# Patient Record
Sex: Female | Born: 1940 | Race: White | Hispanic: No | Smoking: Never smoker
Health system: Southern US, Community
[De-identification: ages and names within clinical notes are randomized; demographics above are authoritative.]

## PROBLEM LIST (undated history)

## (undated) DIAGNOSIS — E039 Hypothyroidism, unspecified: Secondary | ICD-10-CM

## (undated) DIAGNOSIS — I10 Essential (primary) hypertension: Secondary | ICD-10-CM

## (undated) DIAGNOSIS — Z8489 Family history of other specified conditions: Secondary | ICD-10-CM

## (undated) DIAGNOSIS — E785 Hyperlipidemia, unspecified: Secondary | ICD-10-CM

## (undated) DIAGNOSIS — I639 Cerebral infarction, unspecified: Secondary | ICD-10-CM

## (undated) DIAGNOSIS — M810 Age-related osteoporosis without current pathological fracture: Secondary | ICD-10-CM

## (undated) DIAGNOSIS — C73 Malignant neoplasm of thyroid gland: Secondary | ICD-10-CM

## (undated) DIAGNOSIS — I2510 Atherosclerotic heart disease of native coronary artery without angina pectoris: Secondary | ICD-10-CM

## (undated) HISTORY — DX: Cerebral infarction, unspecified: I63.9

## (undated) HISTORY — PX: APPENDECTOMY: SHX54

## (undated) HISTORY — PX: OTHER SURGICAL HISTORY: SHX169

## (undated) HISTORY — PX: CHOLECYSTECTOMY: SHX55

## (undated) HISTORY — PX: ABDOMINAL HYSTERECTOMY: SHX81

## (undated) HISTORY — PX: TOTAL HIP ARTHROPLASTY: SHX124

## (undated) HISTORY — PX: ANKLE SURGERY: SHX546

## (undated) HISTORY — PX: THYROIDECTOMY: SHX17

---

## 1999-04-09 ENCOUNTER — Other Ambulatory Visit: Admission: RE | Admit: 1999-04-09 | Discharge: 1999-04-09 | Payer: Self-pay | Admitting: Obstetrics and Gynecology

## 2000-02-20 ENCOUNTER — Encounter: Payer: Self-pay | Admitting: Obstetrics and Gynecology

## 2000-02-20 ENCOUNTER — Encounter: Admission: RE | Admit: 2000-02-20 | Discharge: 2000-02-20 | Payer: Self-pay | Admitting: Obstetrics and Gynecology

## 2000-04-06 ENCOUNTER — Encounter: Admission: RE | Admit: 2000-04-06 | Discharge: 2000-07-05 | Payer: Self-pay | Admitting: Endocrinology

## 2000-07-14 ENCOUNTER — Encounter: Admission: RE | Admit: 2000-07-14 | Discharge: 2000-07-14 | Payer: Self-pay | Admitting: Endocrinology

## 2000-12-31 ENCOUNTER — Ambulatory Visit (HOSPITAL_COMMUNITY): Admission: RE | Admit: 2000-12-31 | Discharge: 2000-12-31 | Payer: Self-pay | Admitting: Pediatrics

## 2000-12-31 ENCOUNTER — Encounter: Payer: Self-pay | Admitting: Pediatrics

## 2001-06-24 ENCOUNTER — Encounter: Admission: RE | Admit: 2001-06-24 | Discharge: 2001-06-24 | Payer: Self-pay | Admitting: Obstetrics and Gynecology

## 2001-06-24 ENCOUNTER — Encounter: Payer: Self-pay | Admitting: Obstetrics and Gynecology

## 2002-04-07 ENCOUNTER — Encounter: Admission: RE | Admit: 2002-04-07 | Discharge: 2002-04-07 | Payer: Self-pay | Admitting: Orthopedic Surgery

## 2002-04-07 ENCOUNTER — Encounter: Payer: Self-pay | Admitting: Orthopedic Surgery

## 2002-06-29 ENCOUNTER — Encounter: Admission: RE | Admit: 2002-06-29 | Discharge: 2002-06-29 | Payer: Self-pay | Admitting: Obstetrics and Gynecology

## 2002-06-29 ENCOUNTER — Encounter: Payer: Self-pay | Admitting: Obstetrics and Gynecology

## 2002-09-06 ENCOUNTER — Encounter: Payer: Self-pay | Admitting: Orthopedic Surgery

## 2002-09-06 ENCOUNTER — Encounter: Admission: RE | Admit: 2002-09-06 | Discharge: 2002-09-06 | Payer: Self-pay | Admitting: *Deleted

## 2002-12-01 ENCOUNTER — Encounter: Payer: Self-pay | Admitting: Orthopedic Surgery

## 2002-12-05 ENCOUNTER — Inpatient Hospital Stay (HOSPITAL_COMMUNITY): Admission: RE | Admit: 2002-12-05 | Discharge: 2002-12-08 | Payer: Self-pay | Admitting: Orthopedic Surgery

## 2002-12-05 ENCOUNTER — Encounter: Payer: Self-pay | Admitting: Orthopedic Surgery

## 2003-07-27 ENCOUNTER — Encounter: Payer: Self-pay | Admitting: Obstetrics and Gynecology

## 2003-07-27 ENCOUNTER — Encounter: Admission: RE | Admit: 2003-07-27 | Discharge: 2003-07-27 | Payer: Self-pay | Admitting: Obstetrics and Gynecology

## 2003-12-05 ENCOUNTER — Ambulatory Visit (HOSPITAL_COMMUNITY): Admission: RE | Admit: 2003-12-05 | Discharge: 2003-12-05 | Payer: Self-pay | Admitting: Pediatrics

## 2004-09-17 ENCOUNTER — Encounter: Admission: RE | Admit: 2004-09-17 | Discharge: 2004-09-17 | Payer: Self-pay | Admitting: Obstetrics and Gynecology

## 2004-09-30 ENCOUNTER — Encounter: Admission: RE | Admit: 2004-09-30 | Discharge: 2004-09-30 | Payer: Self-pay | Admitting: Obstetrics and Gynecology

## 2005-01-01 ENCOUNTER — Encounter: Admission: RE | Admit: 2005-01-01 | Discharge: 2005-01-01 | Payer: Self-pay | Admitting: Endocrinology

## 2005-02-06 ENCOUNTER — Other Ambulatory Visit: Admission: RE | Admit: 2005-02-06 | Discharge: 2005-02-06 | Payer: Self-pay | Admitting: Interventional Radiology

## 2005-02-06 ENCOUNTER — Encounter (INDEPENDENT_AMBULATORY_CARE_PROVIDER_SITE_OTHER): Payer: Self-pay | Admitting: Specialist

## 2005-02-06 ENCOUNTER — Encounter: Admission: RE | Admit: 2005-02-06 | Discharge: 2005-02-06 | Payer: Self-pay | Admitting: Endocrinology

## 2005-03-28 ENCOUNTER — Observation Stay (HOSPITAL_COMMUNITY): Admission: RE | Admit: 2005-03-28 | Discharge: 2005-03-29 | Payer: Self-pay | Admitting: Surgery

## 2005-03-28 ENCOUNTER — Encounter (INDEPENDENT_AMBULATORY_CARE_PROVIDER_SITE_OTHER): Payer: Self-pay | Admitting: Specialist

## 2005-05-01 ENCOUNTER — Encounter: Payer: Self-pay | Admitting: Endocrinology

## 2005-05-02 ENCOUNTER — Ambulatory Visit (HOSPITAL_COMMUNITY): Admission: RE | Admit: 2005-05-02 | Discharge: 2005-05-02 | Payer: Self-pay | Admitting: Endocrinology

## 2005-12-19 ENCOUNTER — Encounter: Admission: RE | Admit: 2005-12-19 | Discharge: 2005-12-19 | Payer: Self-pay | Admitting: Obstetrics and Gynecology

## 2007-01-11 ENCOUNTER — Encounter: Admission: RE | Admit: 2007-01-11 | Discharge: 2007-01-11 | Payer: Self-pay | Admitting: Obstetrics and Gynecology

## 2007-06-30 ENCOUNTER — Ambulatory Visit: Payer: Self-pay | Admitting: Internal Medicine

## 2007-07-12 ENCOUNTER — Ambulatory Visit: Payer: Self-pay | Admitting: Internal Medicine

## 2008-01-25 ENCOUNTER — Encounter: Admission: RE | Admit: 2008-01-25 | Discharge: 2008-01-25 | Payer: Self-pay | Admitting: Obstetrics and Gynecology

## 2008-01-28 ENCOUNTER — Encounter: Admission: RE | Admit: 2008-01-28 | Discharge: 2008-01-28 | Payer: Self-pay | Admitting: Endocrinology

## 2008-06-16 ENCOUNTER — Encounter: Payer: Self-pay | Admitting: Family Medicine

## 2009-01-31 ENCOUNTER — Encounter: Admission: RE | Admit: 2009-01-31 | Discharge: 2009-01-31 | Payer: Self-pay | Admitting: Obstetrics and Gynecology

## 2009-05-08 ENCOUNTER — Telehealth (INDEPENDENT_AMBULATORY_CARE_PROVIDER_SITE_OTHER): Payer: Self-pay | Admitting: *Deleted

## 2010-01-25 HISTORY — PX: CARDIAC CATHETERIZATION: SHX172

## 2010-05-29 ENCOUNTER — Ambulatory Visit: Payer: Self-pay | Admitting: Vascular Surgery

## 2010-10-17 ENCOUNTER — Encounter: Admission: RE | Admit: 2010-10-17 | Discharge: 2010-10-17 | Payer: Self-pay | Admitting: Obstetrics and Gynecology

## 2010-12-07 ENCOUNTER — Encounter: Payer: Self-pay | Admitting: Obstetrics and Gynecology

## 2010-12-08 ENCOUNTER — Encounter: Payer: Self-pay | Admitting: Endocrinology

## 2011-04-01 NOTE — Consult Note (Signed)
NEW PATIENT CONSULTATION   Stracener, BILLIE-LYNN D  DOB:  11-Nov-1941                                       05/29/2010  CHART#:[id number]   Billie-Lynn Maziarz presents today for evaluation of bilateral  telangiectasia.  She also has some difficulty with swelling in her left  foot.  This occurs with sitting or standing and is worse if she has been  on it for a long of period time.  She does not have any history of DVT  or superficial thrombophlebitis.  She does have telangiectasia over her  lateral thighs more so on the right than on the left.   PAST MEDICAL HISTORY:  Her past history is significant for elevated  cholesterol and hypertension.  She does have history of thyroid cancer  as well.Marland Kitchen   FAMILY HISTORY:  Significant for significant varicosities in her mother.  No other major family history.   SOCIAL HISTORY:  She is single.  She does not have children.  She does  not smoke or drink alcohol.   REVIEW OF SYSTEMS:  Weight is 155 pounds.  She is 5 feet 3 inches tall.  Cardiac, pulmonary, GI, and GU negative.  VASCULAR:  Positive for swelling of left foot.  NEUROLOGIC:  Negative.  MUSCULOSKELETAL:  Positive for arthritis.  Psychiatric, EENT, hematologic and skin are all negative.   PHYSICAL EXAMINATION:  Well developed, well nourished white female  appearing stated age in no acute distress.  Blood pressure 139/68, pulse 59, respirations 18.  HEENT:  Normal.  Her radial and dorsalis pedis pulses are 2+ bilaterally.  MUSCULOSKELETAL:  Shows no major deformity or cyanosis.  NEUROLOGIC:  No focal weakness or paresthesias.  SKIN:  Without ulcers or rashes.  She does not have any significant swelling today and does not have any  large varicosities.  She does have scattered telangiectasia.   She had noninvasive laboratory studies in our office which I have  ordered and independently reviewed.  This does show no evidence of DVT  and she has no evidence of  __________  of deep veins or her superficial  veins bilaterally either in her great or small saphenous vein.  She does  have some tortuous veins on the lateral aspect of her right leg.   I explained that she does not have any venous pathology that would  explain pelvic pain or swelling in her foot.  I explained that these are  frequently multifactorial.  I did explain the option of sclerotherapy  for concern regarding appearance of telangiectasia but explained that  this is nothing that would put her at increased risk for major morbidity  from her veins.  She understands this and will see [place] again on an as-  needed basis.     Larina Earthly, M.D.  Electronically Signed   TFE/MEDQ  D:  05/29/2010  T:  05/30/2010  Job:  [id number]   cc:   Jeannett Senior A. Evlyn Kanner, M.D.

## 2011-04-01 NOTE — Procedures (Signed)
LOWER EXTREMITY VENOUS REFLUX EXAM   INDICATION:  Left lower extremity swelling.   EXAM:  Using color-flow imaging and pulse Doppler spectral analysis, the  right and left common femoral, superficial femoral, popliteal, posterior  tibial, greater and lesser saphenous veins are evaluated.  There is no  evidence suggesting deep venous insufficiency in the right and left  lower extremities.   The right and left saphenofemoral junctions are competent. The right and  left GSV's are competent.   The right and left proximal short saphenous veins demonstrate  competency.   GSV Diameter (used if found to be incompetent only)                                            Right    Left  Proximal Greater Saphenous Vein           cm       cm  Proximal-to-mid-thigh                     cm       cm  Mid thigh                                 cm       cm  Mid-distal thigh                          cm       cm  Distal thigh                              cm       cm  Knee                                      cm       cm   IMPRESSION:  1. Right and left greater saphenous veins show no evidence of      significant reflux.  2. The deep venous system is competent.  3. The right and left lesser saphenous veins are competent.   ___________________________________________  Larina Earthly, M.D.   AS/MEDQ  D:  05/29/2010  T:  05/29/2010  Job:  [id number]

## 2011-04-04 NOTE — H&P (Signed)
NAMEXAVIER, [name] [name]                       ACCOUNT NO.:  [id number]   MEDICAL RECORD NO.:  [id number]                   PATIENT TYPE:  INP   LOCATION:  0462                                 FACILITY:  [facility]   PHYSICIAN:  Ollen Gross, M.D.                 DATE OF BIRTH:  June 20, 1941   DATE OF ADMISSION:  12/05/2002  DATE OF DISCHARGE:                                HISTORY & PHYSICAL   CHIEF COMPLAINT:  Right hip pain.   HISTORY OF PRESENT ILLNESS:  The patient is a 70 year old female who has  been seen in consultation by Dr. Ollen Gross for right hip pain.  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  She has previously been seen by Dr.  Thurston Hole in the past and was noted to have some arthritis.  She has undergone  intra-articular injections by Dr. Barrington Ellison which did help for several months.  She underwent a second injection back in October which did not provide as  much relief.  The hip pain has been quite progressive, and she had x-rays in  Dr. Sherene Sires office showing a marked progression of the osteoarthritis from  May to October with significant bone-on-bone changes throughout.  A followup  x-ray in Dr. Deri Fuelling office showed further collapse with some early  subluxation of the femoral head.  She has tried anti-inflammatories in the  past, including Relafen, but has reached a point where she is not getting  benefit from medications.  It is felt she has reached a point where she  could benefit from undergoing a total hip replacement arthroplasty.  Risks  and benefits of this procedure have been discussed with the patient at  length, and she has elected to proceed with surgery.   ALLERGIES:  1. PENICILLIN with a bad reaction.  Anaphylaxis type reaction in the past     (the patient has taken Ancef and other cephalosporins without     difficulty).  2. SULFA causes rash and hives.  3. ADHESIVE TAPE causes  skin irritations, however, she is able to tolerate     Steri-Strips.   MEDICATIONS:  1. Altace.  2. Vitamin E.  3. Relafen, which she has stopped preoperatively.   PAST MEDICAL HISTORY:  1. Hypertension.  2. 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  3. Previous left trimalleolar ankle fracture.  4. Postmenopausal.  5. Gallbladder disease, however, she has undergone a cholecystectomy.   PAST SURGICAL HISTORY:  1. Tonsillectomy and adenoidectomy at age 74.  2. She has undergone a partial thyroidectomy in 1966.  3. Cholecystectomy at age 29.  78. Partial hysterectomy at age 77.  5. Pinning left ankle secondary to fracture age 22.   SOCIAL HISTORY:  Divorced.  She does work as a Publishing rights manager.  Nonsmoker.  Only occasional intake of alcohol.  No children.  Her brother  will be assisting her with care after surgery.  She lives in a one story  home with three steps entering into her home.   FAMILY HISTORY:  Significant for her mother at age 53, with hypertension and  diabetes.  She has a sister with hypertension.  Cancer is also noted to be  in the family with breast cancer with her mother, and also throat cancer  with a brother.   REVIEW OF SYMPTOMS:  GENERAL:  No fevers, chills, or night sweats.  NEUROLOGIC:  No seizures, syncope, or paralysis.  RESPIRATORY:  No shortness  of breath, productive cough, or hemoptysis.  CARDIOVASCULAR:  No chest pain,  angina, or orthopnea.  GASTROINTESTINAL:  Previous history of gallbladder  disease, no nausea, vomiting, diarrhea, constipation, no blood or mucus in  the stool.  GENITOURINARY:  No dysuria, hematuria, or discharge.  MUSCULOSKELETAL:  Pertinent to that of the right hip found in the history of  present illness.   PHYSICAL EXAMINATION:  VITAL SIGNS:  Pulse 64, respirations 12, blood  pressure 142/71.  GENERAL:  The patient is a 70 year old female, well-developed, well-  nourished, appears in no acute distress.  She is alert and  oriented and  cooperative.  Excellent historian.  In no acute distress.  HEENT:  Normocephalic, atraumatic.  Pupils are round and reactive.  Extraocular movements were intact.  NECK:  Supple, no carotid bruits are appreciated.  CHEST:  Clear to auscultation anterior and posterior chest walls.  No  wheezes, rhonchi, or rales noted.  HEART:  Regular rate and rhythm.  S1 and S2 noted.  No rubs, thrills,  palpitations appreciated.  ABDOMEN:  Soft, bowel sounds present.  RECTAL:  Not done, not pertinent to present illness.  BREASTS:  Not done, not pertinent to present illness.  GENITALIA:  Not done, not pertinent to present illness.  EXTREMITIES:  Significant to that of the right lower extremity.  Right hip  flexion reveals only about 95 degrees, only about 5 degrees of internal  rotation, only about 10 degrees of external rotation, and 20 degrees of  abduction.  She does walk with an antalgic gait on the right.  She has about  1/4 inch shortening on the right as compared to the left.   LABORATORY DATA:  X-rays show severe collapse with some early lateral  subluxation of the femoral head with significant bone-on-bone changes  throughout the right hip joint.   IMPRESSION:  1. Osteoarthritis of the right hip.  2. Hypertension.  3. Postmenopausal.  4. History of thyroid cold nodules.    PLAN:  The patient will be admitted to [facility] to undergo a  right total hip replacement arthroplasty.  The patient's medical doctor is  Dr. Evlyn Kanner.  Dr. Evlyn Kanner will be notified of the room number on admission and  be consulted if needed for any medical assistance with this patient  throughout the hospital course.  Surgery will be performed by Dr. Ollen Gross.      Alexzandrew L. Julien Girt, P.A.              Ollen Gross, M.D.    ALP/MEDQ  D:  12/06/2002  T:  12/07/2002  Job:  [id number]   cc:   Jeannett Senior A. Evlyn Kanner, M.D.  [address]  [place]  [place] [address]  Fax: [phone]

## 2011-04-04 NOTE — Op Note (Signed)
[name] [name], [name] [name]            ACCOUNT NO.:  [id number]   MEDICAL RECORD NO.:  [id number]          PATIENT TYPE:  OBV   LOCATION:  0450                         FACILITY:  [facility]   PHYSICIAN:  Velora Heckler, MD      DATE OF BIRTH:  [birth year]   DATE OF PROCEDURE:  03/28/2005  DATE OF DISCHARGE:                                 OPERATIVE REPORT   PREOPERATIVE DIAGNOSIS:  Papillary thyroid carcinoma.   POSTOPERATIVE DIAGNOSIS:  Papillary thyroid carcinoma.   PROCEDURE:  Completion thyroidectomy (isthmus and left thyroid lobe).   SURGEON:  Velora Heckler, MD.   ASSISTANT:  Jerelene Redden, MD   ANESTHESIA:  General per Quentin Cornwall. Council Mechanic, M.D.   ESTIMATED BLOOD LOSS:  Minimal.   PREPARATION:  Betadine.   COMPLICATIONS:  None.   INDICATIONS:  The patient is a 70 year old white female referred by Dr.  Ardyth Harps for newly diagnosed papillary thyroid carcinoma. The patient  gives a history of radiation therapy to the neck for treatment of  tonsillitis at age 78 years in [place], [place]. The patient  developed thyroid nodules and underwent right thyroid lobectomy in 1966.  This was found to be benign disease. The patient has continued to have a  small goiter. However, this became more prominent and the patient underwent  thyroid fine-needle aspiration. Findings were consistent with papillary  carcinoma. The patient is now referred for completion thyroidectomy.   DESCRIPTION OF PROCEDURE:  The procedure was done in OR #11 at [facility]. The patient is brought to the operating room, placed in  supine position on the operating room table. Following the administration of  general anesthesia, the patient is positioned and then prepped and draped in  usual strict aseptic fashion. After ascertaining that an adequate level of  anesthesia been obtained, a Kocher incision on the neck is reopened with a  #15 blade. Dissection was carried down through  subcutaneous tissues and  platysma. Hemostasis was obtained with electrocautery. Skin flaps were  elevated cephalad and caudad. Mahorner self-retaining retractors placed for  exposure. Strap muscles were incised in the midline. Dissection was carried  down to the thyroid gland. Strap muscles were elevated to the right exposing  the entire right side of the isthmus to the surgical margin. Right thyroid  lobe is surgically absent. No masses were felt in the right thyroid bed.  There is no lymphadenopathy in the right neck.   Next we turned our attention to the left neck. The strap muscles were  elevated off the anterior surface of the thyroid gland. Venous tributaries  are ligated with small and medium ligaclips for hemostasis. Strap muscles  are reflected laterally. Middle thyroid vein is divided between medium  ligaclips. The gland was gently dissected out with a Barista.  Inferior venous tributaries were divided between medium ligaclips. Superior  pole vessels were dissected out, ligated in continuity with 2-0 silk ties  and medium ligaclips and divided. Parathyroid tissue was identified at the  superior pole and preserved. Inferiorly there was a parathyroid gland which  was also identified and preserved  on its vascular pedicle. Gland is rolled  medially. Branches of the inferior thyroid artery were divided between small  ligaclips. Gland is rolled up and onto the trachea. Ligament of Allyson Sabal is  transected with the electrocautery. Gland was mobilized across the midline.  The gland is then completely resected off of the trachea and submitted to  pathology for review. Sutures used to mark the left superior pole. Neck is  irrigated with warm saline. Hemostasis was obtained with small ligaclips and  judicious use of the electrocautery. Surgicel was placed over the area of  the recurrent laryngeal nerve. Strap muscles were reapproximated in the  midline with interrupted 3-0 Vicryl  sutures. Platysma was closed with  interrupted 3-0 Vicryl sutures. Skin edges were reapproximated with a  running 4-0 Vicryl subcuticular suture. Wound is washed and dried. Benzoin,  Steri-Strips were applied. Sterile dressings were applied. The patient is  awakened from anesthesia and brought to the recovery room in stable  condition. The patient tolerated the procedure well.      TMG/MEDQ  D:  03/28/2005  T:  03/28/2005  Job:  [id number]   cc:   Jeannett Senior A. Evlyn Kanner, M.D.  [address]  [place]  [place] [address]  Fax: [phone]   Velora Heckler, MD  [phone] [address] [place]  [place] [address]

## 2011-04-04 NOTE — Discharge Summary (Signed)
[name] [name], [name] [name]                       ACCOUNT NO.:  [id number]   MEDICAL RECORD NO.:  [id number]                   PATIENT TYPE:  INP   LOCATION:  0462                                 FACILITY:  [facility]   PHYSICIAN:  Ollen Gross, M.D.                 DATE OF BIRTH:  [birth year]   DATE OF ADMISSION:  12/05/2002  DATE OF DISCHARGE:  12/08/2002                                 DISCHARGE SUMMARY   ADMISSION DIAGNOSES:  1. Osteoarthritis, right hip.  2. Hypertension.  3. Postmenopausal.  4. History of cold thyroid nodules.   DISCHARGE DIAGNOSES:  1. Osteoarthritis, right hip, status post right total hip replacement     arthroplasty.  2. Hypertension.  3. Postmenopausal.  4. History of cold thyroid nodules.   PROCEDURE:  The patient was taken to the OR on 12/05/2001 and underwent a  right total hip replacement arthroplasty, surgeon Ollen Gross, M.D.,  assistant Alexzandrew L. Perkins, P.A.-C.  Surgery was done under spinal and  general anesthesia.  Hemovac drain x 1.  Estimated blood loss 300 ml.   BRIEF HISTORY:  The patient is a 70 year old female seen by Dr. Lequita Halt for  ongoing right hip pain.  She was seen in the past and was known to have some  arthritis.  She has undergone intraarticular injections per Dr. Barrington Ellison for  several months now.  Her last injection back in October did not provide much  relief.  X-rays taken in the office, which were brought over, showed  significant bone-on-bone changes.  Followup x-rays in Dr. Deri Fuelling office  showed further collapse and early subluxation of the femoral head.  It was  felt she would benefit from undergoing a total hip replacement.  The risks  and benefits were discussed, and the patient was admitted to the hospital.   LABORATORY DATA:  Did not have her admission CBC on the chart.  Postoperatively, hemoglobin was 11.3, hematocrit 32.8.  Last noted during  hospital course was hemoglobin 10.8, and hematocrit 30.4.  PT and  PTT on  admission were 11.5, and 28, respectively, with an INR of 0.8.  Serial pro  times for Coumadin protocol showed last noted PT/INR 17.1/1.5.  BMET on  admission was all within normal limits with the exception of only minimally  elevated glucose of 129.  Followup BMET on 12/07/2002 showed electrolytes  within normal limits with glucose 126.  Urinalysis on admission only showed  trace ketones, otherwise negative.  Blood group type A positive.   EKG dated 12/04/2002 showed normal sinus rhythm, normal EKG.  No old tracings  to compare, confirmed by Francisca December, M.D.   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  Hip films and pelvic films  postoperatively show right total hip prosthesis placement without  radiographic evidence of complicating features.   HOSPITAL COURSE:  The  patient was admitted to [facility], taken to  the OR, and underwent the above-stated procedure without complication.  The  patient tolerated the procedure well and later transferred to the recovery  room and then to the orthopedic floor for continued postop care.  The  patient was placed on PCA for pain control following surgery.  Hemovac drain  placed at time of surgery was pulled on postop day #1.  Labs were followed.  The patient remained hemodynamically stable.  She did have a little bit of  itching develop on postop day #1.  PCA was discontinued.  The patient was  weaned over to p.o. medications.   PT and OT were consulted postop for ambulation and ADLs.  The patient did  well with physical therapy, up ambulating approximately 20 feet by postop  day #1, 50 feet by postop day #2, 150 feet by postop day #3.  Crutches were  initiated on postop day #2 and was handling well.  By postop day #3, the  patient was doing much better and progressed very well with physical  therapy.  Seen on rounds by Dr. Lequita Halt who decided  patient could be  discharged home.   DISPOSITION:  1. The patient was discharged home on 12/08/2002.  2. For discharge diagnoses, please see above.   DISCHARGE MEDICATIONS:  The patient has Flexeril at home with prescription  for Percocet for pain and Coumadin as per pharmacy.   DIET:  Low sodium   ACTIVITY:  Touchdown weightbearing.  Home PT, OT [facility]  Hip  precautions at all times.   FOLLOW UP:  One and one-half weeks from surgery; call the office for  appointment.   CONDITION ON DISCHARGE:  Improved.     Alexzandrew L. Julien Girt, P.A.              Ollen Gross, M.D.    ALP/MEDQ  D:  01/10/2003  T:  01/10/2003  Job:  [id number]

## 2011-04-04 NOTE — Op Note (Signed)
[name] [name], [name] [name]                       ACCOUNT NO.:  [id number]   MEDICAL RECORD NO.:  [id number]                   PATIENT TYPE:  INP   LOCATION:  NA                                   FACILITY:  [facility]   PHYSICIAN:  Ollen Gross, M.D.                 DATE OF BIRTH:  06-04-1941   DATE OF PROCEDURE:  12/05/2002  DATE OF DISCHARGE:                                 OPERATIVE REPORT   PREOPERATIVE DIAGNOSES:  Osteoarthritis, right hip.   POSTOPERATIVE DIAGNOSES:  Osteoarthritis, right hip.   PROCEDURE:  Right total hip arthroplasty.   SURGEON:  Ollen Gross, M.D.   ASSISTANT:  Alexzandrew L. Julien Girt, P.A.   ANESTHESIA:  Spinal and general.   ESTIMATED BLOOD LOSS:  300   DRAINS:  Hemovac x1.   COMPLICATIONS:  None.   CONDITION:  Stable to recovery.   BRIEF CLINICAL NOTE:  [name] [name] is a 70 year old female who has severe  osteoarthritis of the right hip with pain refractory to nonoperative  management. She presents now for right total hip arthroplasty.   DESCRIPTION OF PROCEDURE:  After attempted administration at spinal  anesthetic, the patient's Foley catheter was placed and she was placed in  the left lateral decubitus position with the right side up and held with a  hip positioner. The right lower extremity was isolated from the perineum  with plastic drapes and prepped and draped in the usual sterile fashion. We  tested the spinal ______________ again, but she was still having pain with  that. She subsequently had a general anesthetic placed. Once it was  successfully placed then we did the mini posterolateral incision with the 10  blade through the subcutaneous tissue to the level of the fascia lata which  was incised in line with a skin incision. The sciatic nerve was palpated and  protected and the short external rotators isolated off the femur.  Capsulectomy was then performed, hip dislocated and the center of the  femoral head marked. Trial prosthesis placed  such that the center of the  trial head corresponds to the center of her native femoral head. Osteotomy  line is marked with the cautery and osteotomy made with an oscillating saw.  The femoral head is removed and the femur retracted anteriorly and  acetabular exposure obtained.   The labrum is removed and acetabular reaming initiated with 47 mm coursing  in increments of 2 up to a 53. Then we put a 54 mm pinnacle acetabular shell  with excellent purchase and the put an additional two dome screws in. The  trial 36 mm neutral liner was placed.   The femur was prepared first with the canal finder and irrigation. Axial  reaming is performed to 13.5 mm, proximal reaming to an 3F and the sleeve  machined to a small. The 3F small sleeve is placed with an 18 x 13 stem and  a 36 plus 8  neck. We anteverted this about 10 degrees beyond her normal  version as her normal version was near neutral. The trial 36 plus 0 head was  placed, hip reduced with outstanding stability, full extension, full  external rotation, 70 degrees flexion, 40 degrees adduction, and 90 degrees  internal rotation and 90 degrees of flexion, 70 degrees internal rotation.  All the trials were then removed and the permanent apex hole eliminator  placed into the acetabular shell. The 36 mm neutral metal liner is then  placed. The sleeve 109F small is then placed into the proximal femur and then  an 18 x 13 stem with a 36 plus 8 neck is placed again about 10 degrees  beyond her native anteversion. Once the stem was fully impacted and the 36  plus zero head is placed, hip reduced with the same stability parameters.  The wound was copiously irrigated with antibiotic solution and short  external rotators reattached to the femur through drill holes. The fascia  lata was closed over a Hemovac drain with interrupted #1 Vicryl, subcu  closed with #1 and then 2-0 Vicryl and subcuticular with running 4-0  Monocryl. The incision length was  3 1/2 inches. Steri-Strips applied,  incision clean and dry and a bulky sterile dressing applied. The drain was  hooked to suction, she was placed a knee immobilizer, awakened and  transported to recovery in stable condition.                                               Ollen Gross, M.D.    FA/MEDQ  D:  12/05/2002  T:  12/05/2002  Job:  [id number]

## 2011-09-10 ENCOUNTER — Other Ambulatory Visit: Payer: Self-pay | Admitting: Obstetrics and Gynecology

## 2011-09-10 DIAGNOSIS — Z1231 Encounter for screening mammogram for malignant neoplasm of breast: Secondary | ICD-10-CM

## 2011-10-20 ENCOUNTER — Ambulatory Visit: Payer: Self-pay

## 2011-10-23 ENCOUNTER — Ambulatory Visit: Payer: Self-pay

## 2011-10-27 ENCOUNTER — Ambulatory Visit
Admission: RE | Admit: 2011-10-27 | Discharge: 2011-10-27 | Disposition: A | Discharge status: Home / Self Care | Payer: BLUE CROSS/BLUE SHIELD | Source: Ambulatory Visit | Attending: Obstetrics and Gynecology | Admitting: Obstetrics and Gynecology

## 2011-10-27 DIAGNOSIS — Z1231 Encounter for screening mammogram for malignant neoplasm of breast: Secondary | ICD-10-CM

## 2011-11-04 ENCOUNTER — Other Ambulatory Visit: Payer: Self-pay | Admitting: Dermatology

## 2011-12-12 DIAGNOSIS — Z01419 Encounter for gynecological examination (general) (routine) without abnormal findings: Secondary | ICD-10-CM | POA: Diagnosis not present

## 2011-12-12 DIAGNOSIS — Z124 Encounter for screening for malignant neoplasm of cervix: Secondary | ICD-10-CM | POA: Diagnosis not present

## 2011-12-17 ENCOUNTER — Other Ambulatory Visit: Payer: Self-pay | Admitting: Dermatology

## 2011-12-17 DIAGNOSIS — D485 Neoplasm of uncertain behavior of skin: Secondary | ICD-10-CM | POA: Diagnosis not present

## 2012-05-14 DIAGNOSIS — G500 Trigeminal neuralgia: Secondary | ICD-10-CM | POA: Diagnosis not present

## 2012-05-14 DIAGNOSIS — E785 Hyperlipidemia, unspecified: Secondary | ICD-10-CM | POA: Diagnosis not present

## 2012-05-14 DIAGNOSIS — E890 Postprocedural hypothyroidism: Secondary | ICD-10-CM | POA: Diagnosis not present

## 2012-05-14 DIAGNOSIS — C73 Malignant neoplasm of thyroid gland: Secondary | ICD-10-CM | POA: Diagnosis not present

## 2012-05-14 DIAGNOSIS — E559 Vitamin D deficiency, unspecified: Secondary | ICD-10-CM | POA: Diagnosis not present

## 2012-05-19 ENCOUNTER — Other Ambulatory Visit (HOSPITAL_COMMUNITY): Payer: Self-pay | Admitting: Endocrinology

## 2012-05-19 DIAGNOSIS — C73 Malignant neoplasm of thyroid gland: Secondary | ICD-10-CM

## 2012-06-07 ENCOUNTER — Inpatient Hospital Stay (HOSPITAL_COMMUNITY): Admission: RE | Admit: 2012-06-07 | Payer: Medicare Other | Source: Ambulatory Visit

## 2012-06-08 ENCOUNTER — Ambulatory Visit (HOSPITAL_COMMUNITY): Payer: Medicare Other

## 2012-06-09 ENCOUNTER — Ambulatory Visit (HOSPITAL_COMMUNITY): Payer: Medicare Other

## 2012-06-11 ENCOUNTER — Encounter (HOSPITAL_COMMUNITY): Payer: Medicare Other

## 2012-06-14 ENCOUNTER — Encounter (HOSPITAL_COMMUNITY)
Admission: RE | Admit: 2012-06-14 | Discharge: 2012-06-14 | Disposition: A | Discharge status: Home / Self Care | Payer: Medicare Other | Source: Ambulatory Visit | Attending: Endocrinology | Admitting: Endocrinology

## 2012-06-14 DIAGNOSIS — C73 Malignant neoplasm of thyroid gland: Secondary | ICD-10-CM | POA: Insufficient documentation

## 2012-06-14 MED ORDER — THYROTROPIN ALFA 1.1 MG IM SOLR
0.9000 mg | INTRAMUSCULAR | Status: DC
  Filled 2012-06-14: qty 0.9

## 2012-06-15 ENCOUNTER — Ambulatory Visit (HOSPITAL_COMMUNITY)
Admission: RE | Admit: 2012-06-15 | Discharge: 2012-06-15 | Disposition: A | Discharge status: Home / Self Care | Payer: Medicare Other | Source: Ambulatory Visit | Attending: Endocrinology | Admitting: Endocrinology

## 2012-06-15 MED ORDER — THYROTROPIN ALFA 1.1 MG IM SOLR
0.9000 mg | INTRAMUSCULAR | Status: DC
  Filled 2012-06-15: qty 0.9

## 2012-06-16 ENCOUNTER — Encounter (HOSPITAL_COMMUNITY)
Admission: RE | Admit: 2012-06-16 | Discharge: 2012-06-16 | Disposition: A | Discharge status: Home / Self Care | Payer: Medicare Other | Source: Ambulatory Visit | Attending: Endocrinology | Admitting: Endocrinology

## 2012-06-18 ENCOUNTER — Encounter (HOSPITAL_COMMUNITY)
Admission: RE | Admit: 2012-06-18 | Discharge: 2012-06-18 | Disposition: A | Discharge status: Home / Self Care | Payer: Medicare Other | Source: Ambulatory Visit | Attending: Endocrinology | Admitting: Endocrinology

## 2012-06-18 ENCOUNTER — Encounter (HOSPITAL_COMMUNITY): Payer: Self-pay

## 2012-06-18 HISTORY — DX: Malignant neoplasm of thyroid gland: C73

## 2012-06-18 MED ORDER — SODIUM IODIDE I 131 CAPSULE
4.2000 | Freq: Once | INTRAVENOUS | Status: AC | PRN
  Administered 2012-06-17: 4.2 via ORAL

## 2012-06-21 LAB — THYROGLOBULIN LEVEL: Thyroglobulin: 2.2 ng/mL (ref 0.0–55.0)

## 2012-06-22 DIAGNOSIS — M949 Disorder of cartilage, unspecified: Secondary | ICD-10-CM | POA: Diagnosis not present

## 2012-06-22 DIAGNOSIS — M899 Disorder of bone, unspecified: Secondary | ICD-10-CM | POA: Diagnosis not present

## 2012-06-23 DIAGNOSIS — L570 Actinic keratosis: Secondary | ICD-10-CM | POA: Diagnosis not present

## 2012-06-23 DIAGNOSIS — L723 Sebaceous cyst: Secondary | ICD-10-CM | POA: Diagnosis not present

## 2012-06-23 DIAGNOSIS — D239 Other benign neoplasm of skin, unspecified: Secondary | ICD-10-CM | POA: Diagnosis not present

## 2012-07-07 MED FILL — Thyrotropin Alfa For Inj 1.1 MG: INTRAMUSCULAR | Qty: 1.8 | Status: AC

## 2012-10-08 DIAGNOSIS — H2510 Age-related nuclear cataract, unspecified eye: Secondary | ICD-10-CM | POA: Diagnosis not present

## 2012-10-21 DIAGNOSIS — H903 Sensorineural hearing loss, bilateral: Secondary | ICD-10-CM | POA: Diagnosis not present

## 2012-11-15 DIAGNOSIS — I10 Essential (primary) hypertension: Secondary | ICD-10-CM | POA: Diagnosis not present

## 2012-11-15 DIAGNOSIS — E890 Postprocedural hypothyroidism: Secondary | ICD-10-CM | POA: Diagnosis not present

## 2012-11-15 DIAGNOSIS — E559 Vitamin D deficiency, unspecified: Secondary | ICD-10-CM | POA: Diagnosis not present

## 2012-11-15 DIAGNOSIS — E785 Hyperlipidemia, unspecified: Secondary | ICD-10-CM | POA: Diagnosis not present

## 2012-12-20 DIAGNOSIS — Z96649 Presence of unspecified artificial hip joint: Secondary | ICD-10-CM | POA: Diagnosis not present

## 2013-01-01 DIAGNOSIS — M25569 Pain in unspecified knee: Secondary | ICD-10-CM | POA: Diagnosis not present

## 2013-01-04 DIAGNOSIS — M76899 Other specified enthesopathies of unspecified lower limb, excluding foot: Secondary | ICD-10-CM | POA: Diagnosis not present

## 2013-05-23 DIAGNOSIS — Z1331 Encounter for screening for depression: Secondary | ICD-10-CM | POA: Diagnosis not present

## 2013-05-23 DIAGNOSIS — C73 Malignant neoplasm of thyroid gland: Secondary | ICD-10-CM | POA: Diagnosis not present

## 2013-05-23 DIAGNOSIS — E890 Postprocedural hypothyroidism: Secondary | ICD-10-CM | POA: Diagnosis not present

## 2013-05-23 DIAGNOSIS — Z6827 Body mass index (BMI) 27.0-27.9, adult: Secondary | ICD-10-CM | POA: Diagnosis not present

## 2013-05-23 DIAGNOSIS — I10 Essential (primary) hypertension: Secondary | ICD-10-CM | POA: Diagnosis not present

## 2013-05-23 DIAGNOSIS — E559 Vitamin D deficiency, unspecified: Secondary | ICD-10-CM | POA: Diagnosis not present

## 2013-05-23 DIAGNOSIS — E785 Hyperlipidemia, unspecified: Secondary | ICD-10-CM | POA: Diagnosis not present

## 2013-05-23 DIAGNOSIS — M810 Age-related osteoporosis without current pathological fracture: Secondary | ICD-10-CM | POA: Diagnosis not present

## 2013-05-23 DIAGNOSIS — R5381 Other malaise: Secondary | ICD-10-CM | POA: Diagnosis not present

## 2013-06-23 DIAGNOSIS — D2370 Other benign neoplasm of skin of unspecified lower limb, including hip: Secondary | ICD-10-CM | POA: Diagnosis not present

## 2013-06-23 DIAGNOSIS — L821 Other seborrheic keratosis: Secondary | ICD-10-CM | POA: Diagnosis not present

## 2013-06-23 DIAGNOSIS — L570 Actinic keratosis: Secondary | ICD-10-CM | POA: Diagnosis not present

## 2013-06-23 DIAGNOSIS — L723 Sebaceous cyst: Secondary | ICD-10-CM | POA: Diagnosis not present

## 2013-06-23 DIAGNOSIS — D1801 Hemangioma of skin and subcutaneous tissue: Secondary | ICD-10-CM | POA: Diagnosis not present

## 2013-06-23 DIAGNOSIS — Z85828 Personal history of other malignant neoplasm of skin: Secondary | ICD-10-CM | POA: Diagnosis not present

## 2013-09-01 ENCOUNTER — Other Ambulatory Visit: Payer: Self-pay

## 2013-09-01 DIAGNOSIS — Z1231 Encounter for screening mammogram for malignant neoplasm of breast: Secondary | ICD-10-CM

## 2013-09-07 DIAGNOSIS — Z124 Encounter for screening for malignant neoplasm of cervix: Secondary | ICD-10-CM | POA: Diagnosis not present

## 2013-09-29 ENCOUNTER — Ambulatory Visit
Admission: RE | Admit: 2013-09-29 | Discharge: 2013-09-29 | Disposition: A | Discharge status: Home / Self Care | Payer: Medicare Other | Source: Ambulatory Visit

## 2013-09-29 DIAGNOSIS — Z1231 Encounter for screening mammogram for malignant neoplasm of breast: Secondary | ICD-10-CM

## 2013-10-10 DIAGNOSIS — M25569 Pain in unspecified knee: Secondary | ICD-10-CM | POA: Diagnosis not present

## 2013-10-20 DIAGNOSIS — M810 Age-related osteoporosis without current pathological fracture: Secondary | ICD-10-CM | POA: Diagnosis not present

## 2013-10-20 DIAGNOSIS — E890 Postprocedural hypothyroidism: Secondary | ICD-10-CM | POA: Diagnosis not present

## 2013-10-20 DIAGNOSIS — E785 Hyperlipidemia, unspecified: Secondary | ICD-10-CM | POA: Diagnosis not present

## 2013-10-20 DIAGNOSIS — D497 Neoplasm of unspecified behavior of endocrine glands and other parts of nervous system: Secondary | ICD-10-CM | POA: Diagnosis not present

## 2013-10-20 DIAGNOSIS — E559 Vitamin D deficiency, unspecified: Secondary | ICD-10-CM | POA: Diagnosis not present

## 2013-10-20 DIAGNOSIS — Z6828 Body mass index (BMI) 28.0-28.9, adult: Secondary | ICD-10-CM | POA: Diagnosis not present

## 2013-10-20 DIAGNOSIS — G500 Trigeminal neuralgia: Secondary | ICD-10-CM | POA: Diagnosis not present

## 2013-10-20 DIAGNOSIS — I10 Essential (primary) hypertension: Secondary | ICD-10-CM | POA: Diagnosis not present

## 2013-11-18 DIAGNOSIS — M1710 Unilateral primary osteoarthritis, unspecified knee: Secondary | ICD-10-CM | POA: Diagnosis not present

## 2013-11-18 DIAGNOSIS — IMO0002 Reserved for concepts with insufficient information to code with codable children: Secondary | ICD-10-CM | POA: Diagnosis not present

## 2014-02-08 DIAGNOSIS — M1710 Unilateral primary osteoarthritis, unspecified knee: Secondary | ICD-10-CM | POA: Diagnosis not present

## 2014-05-05 DIAGNOSIS — M810 Age-related osteoporosis without current pathological fracture: Secondary | ICD-10-CM | POA: Diagnosis not present

## 2014-05-05 DIAGNOSIS — E559 Vitamin D deficiency, unspecified: Secondary | ICD-10-CM | POA: Diagnosis not present

## 2014-05-05 DIAGNOSIS — R079 Chest pain, unspecified: Secondary | ICD-10-CM | POA: Diagnosis not present

## 2014-05-05 DIAGNOSIS — E785 Hyperlipidemia, unspecified: Secondary | ICD-10-CM | POA: Diagnosis not present

## 2014-05-05 DIAGNOSIS — E890 Postprocedural hypothyroidism: Secondary | ICD-10-CM | POA: Diagnosis not present

## 2014-05-05 DIAGNOSIS — D497 Neoplasm of unspecified behavior of endocrine glands and other parts of nervous system: Secondary | ICD-10-CM | POA: Diagnosis not present

## 2014-05-05 DIAGNOSIS — R5381 Other malaise: Secondary | ICD-10-CM | POA: Diagnosis not present

## 2014-05-05 DIAGNOSIS — Z79899 Other long term (current) drug therapy: Secondary | ICD-10-CM | POA: Diagnosis not present

## 2014-05-05 DIAGNOSIS — R5383 Other fatigue: Secondary | ICD-10-CM | POA: Diagnosis not present

## 2014-05-05 DIAGNOSIS — G500 Trigeminal neuralgia: Secondary | ICD-10-CM | POA: Diagnosis not present

## 2014-06-15 ENCOUNTER — Encounter (HOSPITAL_COMMUNITY): Payer: Self-pay

## 2014-06-15 ENCOUNTER — Ambulatory Visit (HOSPITAL_COMMUNITY)
Admission: RE | Admit: 2014-06-15 | Discharge: 2014-06-15 | Disposition: A | Discharge status: Home / Self Care | Payer: Medicare Other | Source: Ambulatory Visit | Attending: Internal Medicine | Admitting: Internal Medicine

## 2014-06-15 VITALS — BP 162/54 | HR 67 | Wt 160.0 lb

## 2014-06-15 DIAGNOSIS — R079 Chest pain, unspecified: Secondary | ICD-10-CM | POA: Insufficient documentation

## 2014-06-15 DIAGNOSIS — M1710 Unilateral primary osteoarthritis, unspecified knee: Secondary | ICD-10-CM | POA: Diagnosis not present

## 2014-06-15 DIAGNOSIS — R0989 Other specified symptoms and signs involving the circulatory and respiratory systems: Secondary | ICD-10-CM | POA: Insufficient documentation

## 2014-06-15 DIAGNOSIS — E039 Hypothyroidism, unspecified: Secondary | ICD-10-CM | POA: Diagnosis not present

## 2014-06-15 DIAGNOSIS — Z885 Allergy status to narcotic agent status: Secondary | ICD-10-CM | POA: Insufficient documentation

## 2014-06-15 DIAGNOSIS — IMO0002 Reserved for concepts with insufficient information to code with codable children: Secondary | ICD-10-CM | POA: Insufficient documentation

## 2014-06-15 DIAGNOSIS — Z8249 Family history of ischemic heart disease and other diseases of the circulatory system: Secondary | ICD-10-CM | POA: Insufficient documentation

## 2014-06-15 DIAGNOSIS — Z880 Allergy status to penicillin: Secondary | ICD-10-CM | POA: Insufficient documentation

## 2014-06-15 DIAGNOSIS — E785 Hyperlipidemia, unspecified: Secondary | ICD-10-CM | POA: Diagnosis not present

## 2014-06-15 DIAGNOSIS — M7989 Other specified soft tissue disorders: Secondary | ICD-10-CM | POA: Diagnosis not present

## 2014-06-15 DIAGNOSIS — R072 Precordial pain: Secondary | ICD-10-CM | POA: Diagnosis not present

## 2014-06-15 DIAGNOSIS — I6521 Occlusion and stenosis of right carotid artery: Secondary | ICD-10-CM

## 2014-06-15 DIAGNOSIS — I6529 Occlusion and stenosis of unspecified carotid artery: Secondary | ICD-10-CM | POA: Insufficient documentation

## 2014-06-15 DIAGNOSIS — I10 Essential (primary) hypertension: Secondary | ICD-10-CM | POA: Diagnosis not present

## 2014-06-15 DIAGNOSIS — Z882 Allergy status to sulfonamides status: Secondary | ICD-10-CM | POA: Diagnosis not present

## 2014-06-15 HISTORY — DX: Hyperlipidemia, unspecified: E78.5

## 2014-06-15 HISTORY — DX: Essential (primary) hypertension: I10

## 2014-06-15 HISTORY — DX: Age-related osteoporosis without current pathological fracture: M81.0

## 2014-06-15 HISTORY — DX: Hypothyroidism, unspecified: E03.9

## 2014-06-15 NOTE — Patient Instructions (Signed)
Your physician has requested that you have a carotid duplex. This test is an ultrasound of the carotid arteries in your neck. It looks at blood flow through these arteries that supply the brain with blood. Allow one hour for this exam. There are no restrictions or special instructions.  Your physician has requested that you have a lexiscan myoview. For further information please visit [email or web address]. Please follow instruction sheet, as given.

## 2014-06-15 NOTE — Addendum Note (Signed)
Encounter addended by: Kerry Dory, CMA on: 06/15/2014  2:43 PM<BR>     Documentation filed: Visit Diagnoses, Orders, Patient Instructions Section

## 2014-06-15 NOTE — Progress Notes (Signed)
Patient ID: [name] [name], female   DOB: [birth year], 73 y.o.   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 Forde Dandy for further evaluation of chest pain.   No known cardiac disease. No previous cardiac w/u.  1-2 years ago started having brief episodes of minor chest pain just lasting a few seconds. About 3.5 months ago had a more prominent episode of CP with central chest heaviness that lasted 2-3 minutes. Took some Tums without relief. Resolved spontaneously. Since that time no further CP. Does chair exercises in Silver [employer]neakers but no aerobic exercise. Has h/o HTN but BP very well controlled at home. Gets mildly SOB when walking hills but otherwise fine. Occasional GERD. Had gallbladder removed years ago.   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  Recent labs:  TC 147 TG 178 HDL 45 LDL 66 LFTs ok Na 132 K 4.8 Cr 0.8  Review of Systems:     Cardiac Review of Systems: {Y] = yes [ ]  = no  Chest Pain [ y  ]  Resting SOB [   ] Exertional SOB  Blue.Reese  ]  Orthopnea [  ]   Pedal Edema [   ]    Palpitations [  ] Syncope  [  ]   Presyncope [   ]  General Review of Systems: [Y] = yes [  ]=no Constitional: recent weight change [  ]; anorexia [  ]; fatigue [  ]; nausea [  ]; night sweats [  ]; fever [  ]; or chills [  ];                                                                      Eyes : blurred vision [  ]; diplopia [   ]; vision changes [  ];  Amaurosis fugax[  ]; Resp: cough [  ];  wheezing[  ];  hemoptysis[  ];  PND [  ];  GI:  gallstones[  ], vomiting[  ];  dysphagia[  ]; melena[  ];  hematochezia [  ]; heartburn[  ];   GU: kidney stones [  ]; hematuria[  ];   dysuria [  ];  nocturia[  ]; incontinence [  ];             Skin: rash, swelling[  ];, hair loss[  ];  peripheral edema[  ];  or itching[  ]; Musculosketetal: myalgias[  ];  joint swelling[y   ];  joint erythema[  ];  joint pain[ y ];  back pain[  ];  Heme/Lymph: bruising[  ];  bleeding[  ];  anemia[  ];  Neuro: TIA[  ];  headaches[  ];  stroke[  ];  vertigo[  ];  seizures[  ];   paresthesias[  ];  difficulty walking[  ];  Psych:depression[  ]; anxiety[  ];  Endocrine: diabetes[  ];  thyroid dysfunction[y  ];  Other:  Past Medical History  Diagnosis Date  . Thyroid carcinoma   . Hypothyroidism   . HTN (hypertension)   . Hyperlipidemia   .  Osteoporosis    Meds: Atorva 20 Lisinopril 10 Levothyroxine 217mcg 3x/week alternating with 195mcg Vitam D-3 MVI Ibuprofen Tylenol   Allergies  Allergen Reactions  . Bee Venom Swelling    Massive swelling  . Penicillins   . Sulfa Antibiotics   . Adhesive [Tape] Rash  . Morphine And Related Rash    History   Social History  . Marital Status: Single    Spouse Name: N/A    Number of Children: N/A  . Years of Education: N/A   Occupational History  . Not on file.   Social History Main Topics  . Smoking status: Never Smoker   . Smokeless tobacco: Not on file  . Alcohol Use: Not on file  . Drug Use: Not on file  . Sexual Activity: Not on file   Other Topics Concern  . Not on file   Social History Narrative  . No narrative on file   Family Hx: Father: Died at 73 d/t ETOH cirrhosis Mother: Died at 21 Breast CA, HL and HTN 2 Brothers: 1 passed due to complications from throat CA 1 Sister: Migraines, MVP and HTN  PHYSICAL EXAM: Filed Vitals:   06/15/14 1338  BP: 162/54  Pulse: 67   General:  Well appearing. No respiratory difficulty HEENT: normal Neck: supple. no JVD. Carotids 2+ bilat; + R bruit. No lymphadenopathy or thryomegaly appreciated. Cor: PMI nondisplaced. Regular rate & rhythm.aortic sclerosis murmur  Lungs: clear Abdomen: soft, nontender, nondistended. No hepatosplenomegaly. No bruits or masses. Good bowel sounds. Extremities: no cyanosis, clubbing, rash, LLE edema 1+ + varicose veins. DPs 1+  bilaterally  Neuro: alert & oriented x 3, cranial nerves grossly intact. moves all 4 extremities w/o difficulty. Affect pleasant.  ECG: Sinus brady 58 No ST-T wave abnormalities.    No results found for this or any previous visit (from the past 24 hour(s)). No results found.   ASSESSMENT: 1. Chest pain 2. R carotid bruit 3. HTN - well controlled (has white-coat HTN) 4. HL  - well controlled on atorvastatin 5. Asymmetric LLE swelling L>R - u/s negative.  PLAN/DISCUSSION:  She has CP with several cardiac RFs and evidence of at least mild vascular disease on exam. Will need Lexiscan Myoview (unable to walk on treadmill due to knee arthritis). Will also need carotid u/s. Given the amount of ibuprofen she takes I would recommend PPI to prevent PUD.   Agree with ECASA 81mg  daily as recommended by Dr. Forde Dandy.  Daniel Bensimhon,MD 2:27 PM

## 2014-06-27 DIAGNOSIS — M1710 Unilateral primary osteoarthritis, unspecified knee: Secondary | ICD-10-CM | POA: Diagnosis not present

## 2014-06-28 ENCOUNTER — Ambulatory Visit (HOSPITAL_BASED_OUTPATIENT_CLINIC_OR_DEPARTMENT_OTHER): Payer: Medicare Other | Admitting: Cardiology

## 2014-06-28 ENCOUNTER — Ambulatory Visit (HOSPITAL_COMMUNITY): Payer: Medicare Other | Attending: Cardiology | Admitting: Radiology

## 2014-06-28 VITALS — BP 161/78 | Ht 63.0 in | Wt 158.0 lb

## 2014-06-28 DIAGNOSIS — R079 Chest pain, unspecified: Secondary | ICD-10-CM | POA: Diagnosis not present

## 2014-06-28 DIAGNOSIS — E785 Hyperlipidemia, unspecified: Secondary | ICD-10-CM | POA: Insufficient documentation

## 2014-06-28 DIAGNOSIS — R0989 Other specified symptoms and signs involving the circulatory and respiratory systems: Secondary | ICD-10-CM

## 2014-06-28 DIAGNOSIS — I779 Disorder of arteries and arterioles, unspecified: Secondary | ICD-10-CM | POA: Insufficient documentation

## 2014-06-28 DIAGNOSIS — I10 Essential (primary) hypertension: Secondary | ICD-10-CM | POA: Diagnosis not present

## 2014-06-28 DIAGNOSIS — I6521 Occlusion and stenosis of right carotid artery: Secondary | ICD-10-CM

## 2014-06-28 DIAGNOSIS — R072 Precordial pain: Secondary | ICD-10-CM

## 2014-06-28 DIAGNOSIS — I6529 Occlusion and stenosis of unspecified carotid artery: Secondary | ICD-10-CM

## 2014-06-28 DIAGNOSIS — R0609 Other forms of dyspnea: Secondary | ICD-10-CM | POA: Diagnosis not present

## 2014-06-28 MED ORDER — TECHNETIUM TC 99M SESTAMIBI GENERIC - CARDIOLITE
30.0000 | Freq: Once | INTRAVENOUS | Status: AC | PRN
  Administered 2014-06-28: 30 via INTRAVENOUS

## 2014-06-28 MED ORDER — REGADENOSON 0.4 MG/5ML IV SOLN
0.4000 mg | Freq: Once | INTRAVENOUS | Status: AC
  Administered 2014-06-28: 0.4 mg via INTRAVENOUS

## 2014-06-28 MED ORDER — TECHNETIUM TC 99M SESTAMIBI GENERIC - CARDIOLITE
10.0000 | Freq: Once | INTRAVENOUS | Status: AC | PRN
  Administered 2014-06-28: 10 via INTRAVENOUS

## 2014-06-28 NOTE — Progress Notes (Signed)
[facility] [address] [place], [place] [address] [phone]    Cardiology Nuclear Med Study  [name] [name] is a 73 y.o. female     MRN : [id number]     DOB: 06/01/1941  Procedure Date: 06/28/2014  Nuclear Med Background Indication for Stress Test:  Evaluation for Ischemia History:  No H/O CAD  Cardiac Risk Factors: Carotid Disease, Hypertension and Lipids  Symptoms:  Chest Pain and DOE   Nuclear Pre-Procedure Caffeine/Decaff Intake:  None > 12 hrs NPO After: 8:30pm   Lungs:  clear O2 Sat: 98% on room air. IV 0.9% NS with Angio Cath:  24g  IV Site: L Antecubital x 1, tolerated well IV Started by:  Irven Baltimore, RN  Chest Size (in):  36 Cup Size: D  Height: 5\' 3"  (1.6 m)  Weight:  158 lb (71.668 kg)  BMI:  Body mass index is 28 kg/(m^2). Tech Comments:  Patient took Lisinopril this am. Irven Baltimore, RN.    Nuclear Med Study 1 or 2 day study: 1 day  Stress Test Type:  Lexiscan  Reading MD: N/A  Order Authorizing Provider:  Glori Bickers, MD  Resting Radionuclide: Technetium 52m Sestamibi  Resting Radionuclide Dose: 11.0 mCi   Stress Radionuclide:  Technetium 61m Sestamibi  Stress Radionuclide Dose: 33.0 mCi           Stress Protocol Rest HR: 60 Stress HR: 87  Rest BP: 161/78 Stress BP: 185/101  Exercise Time (min): n/a METS: n/a   Predicted Max HR: 149 bpm % Max HR: 58.39 bpm Rate Pressure Product: 16095   Dose of Adenosine (mg):  n/a Dose of Lexiscan: 0.4 mg  Dose of Atropine (mg): n/a Dose of Dobutamine: n/a mcg/kg/min (at max HR)  Stress Test Technologist: Perrin Maltese, EMT-P  Nuclear Technologist:  Annye Rusk, CNMT     Rest Procedure:  Myocardial perfusion imaging was performed at rest 45 minutes following the intravenous administration of Technetium 65m Sestamibi. Rest ECG: NSR - Normal EKG, early repolarization  Stress Procedure:  The patient received IV Lexiscan 0.4 mg over 15-seconds.  Technetium 60m  Sestamibi injected at 30-seconds. This patient had chest pressure and a headache with the Lexiscan injection. Quantitative spect images were obtained after a 45 minute delay. Stress ECG: No significant change from baseline ECG  QPS Raw Data Images:  Normal; no motion artifact; normal heart/lung ratio. Extracardiac activity that is not affecting ability to read. Stress Images:  Normal homogeneous uptake in all areas of the myocardium. Rest Images:  Normal homogeneous uptake in all areas of the myocardium. Subtraction (SDS):  No evidence of ischemia. Transient Ischemic Dilatation (Normal <1.22):  1.11 Lung/Heart Ratio (Normal <0.45):  0.38  Quantitative Gated Spect Images QGS EDV:  73 ml QGS ESV:  15 ml  Impression Exercise Capacity:  Lexiscan with no exercise. BP Response:  Hypertensive blood pressure response. Clinical Symptoms:  There is dyspnea. ECG Impression:  No significant ST segment change suggestive of ischemia. Comparison with Prior Nuclear Study: No previous nuclear study performed  Overall Impression:  Normal stress nuclear study.  LV Ejection Fraction: 79%.  LV Wall Motion:  NL LV Function; NL Wall Motion  Dorothy Spark 06/28/2014

## 2014-06-28 NOTE — Progress Notes (Signed)
Carotid duplex performed 

## 2014-07-04 ENCOUNTER — Telehealth (HOSPITAL_COMMUNITY): Payer: Self-pay | Admitting: Vascular Surgery

## 2014-07-04 NOTE — Telephone Encounter (Addendum)
Pt wants a call back from [facility] about test she took last week.. Pt called again today she would like a call back ASAP TODAY per pt... thanks

## 2014-07-06 ENCOUNTER — Telehealth: Payer: Self-pay | Admitting: Cardiology

## 2014-07-06 DIAGNOSIS — L570 Actinic keratosis: Secondary | ICD-10-CM | POA: Diagnosis not present

## 2014-07-06 DIAGNOSIS — D2330 Other benign neoplasm of skin of unspecified part of face: Secondary | ICD-10-CM | POA: Diagnosis not present

## 2014-07-06 DIAGNOSIS — L821 Other seborrheic keratosis: Secondary | ICD-10-CM | POA: Diagnosis not present

## 2014-07-06 DIAGNOSIS — L723 Sebaceous cyst: Secondary | ICD-10-CM | POA: Diagnosis not present

## 2014-07-06 DIAGNOSIS — D2370 Other benign neoplasm of skin of unspecified lower limb, including hip: Secondary | ICD-10-CM | POA: Diagnosis not present

## 2014-07-06 DIAGNOSIS — Z85828 Personal history of other malignant neoplasm of skin: Secondary | ICD-10-CM | POA: Diagnosis not present

## 2014-07-06 DIAGNOSIS — D235 Other benign neoplasm of skin of trunk: Secondary | ICD-10-CM | POA: Diagnosis not present

## 2014-07-06 NOTE — Telephone Encounter (Signed)
Pt called asking for her tests results from 06/29/14. I reviewed results in detail with her.  Kerin Ransom PA-C 07/06/2014 6:03 PM

## 2014-07-12 NOTE — Telephone Encounter (Signed)
Dr Haroldine Laws called and spoke w/pt

## 2014-07-13 DIAGNOSIS — H2510 Age-related nuclear cataract, unspecified eye: Secondary | ICD-10-CM | POA: Diagnosis not present

## 2014-07-13 DIAGNOSIS — H25019 Cortical age-related cataract, unspecified eye: Secondary | ICD-10-CM | POA: Diagnosis not present

## 2014-07-13 DIAGNOSIS — H52209 Unspecified astigmatism, unspecified eye: Secondary | ICD-10-CM | POA: Diagnosis not present

## 2014-07-27 ENCOUNTER — Other Ambulatory Visit: Payer: Self-pay

## 2014-07-27 DIAGNOSIS — L723 Sebaceous cyst: Secondary | ICD-10-CM | POA: Diagnosis not present

## 2014-07-27 DIAGNOSIS — Z85828 Personal history of other malignant neoplasm of skin: Secondary | ICD-10-CM | POA: Diagnosis not present

## 2014-09-01 ENCOUNTER — Other Ambulatory Visit: Payer: Self-pay

## 2014-11-01 DIAGNOSIS — E890 Postprocedural hypothyroidism: Secondary | ICD-10-CM | POA: Diagnosis not present

## 2014-11-01 DIAGNOSIS — E785 Hyperlipidemia, unspecified: Secondary | ICD-10-CM | POA: Diagnosis not present

## 2014-11-01 DIAGNOSIS — Z1389 Encounter for screening for other disorder: Secondary | ICD-10-CM | POA: Diagnosis not present

## 2014-11-01 DIAGNOSIS — I10 Essential (primary) hypertension: Secondary | ICD-10-CM | POA: Diagnosis not present

## 2014-11-01 DIAGNOSIS — D497 Neoplasm of unspecified behavior of endocrine glands and other parts of nervous system: Secondary | ICD-10-CM | POA: Diagnosis not present

## 2014-11-01 DIAGNOSIS — R079 Chest pain, unspecified: Secondary | ICD-10-CM | POA: Diagnosis not present

## 2014-11-01 DIAGNOSIS — M859 Disorder of bone density and structure, unspecified: Secondary | ICD-10-CM | POA: Diagnosis not present

## 2014-11-01 DIAGNOSIS — E559 Vitamin D deficiency, unspecified: Secondary | ICD-10-CM | POA: Diagnosis not present

## 2014-11-01 DIAGNOSIS — G500 Trigeminal neuralgia: Secondary | ICD-10-CM | POA: Diagnosis not present

## 2014-11-01 DIAGNOSIS — I6529 Occlusion and stenosis of unspecified carotid artery: Secondary | ICD-10-CM | POA: Diagnosis not present

## 2014-11-22 ENCOUNTER — Other Ambulatory Visit: Payer: Self-pay

## 2014-11-22 DIAGNOSIS — Z1231 Encounter for screening mammogram for malignant neoplasm of breast: Secondary | ICD-10-CM

## 2014-12-01 ENCOUNTER — Inpatient Hospital Stay: Admission: RE | Admit: 2014-12-01 | Payer: Medicare Other | Source: Ambulatory Visit

## 2014-12-12 ENCOUNTER — Ambulatory Visit
Admission: RE | Admit: 2014-12-12 | Discharge: 2014-12-12 | Disposition: A | Discharge status: Home / Self Care | Payer: Medicare Other | Source: Ambulatory Visit

## 2014-12-12 DIAGNOSIS — Z1231 Encounter for screening mammogram for malignant neoplasm of breast: Secondary | ICD-10-CM

## 2015-01-11 ENCOUNTER — Other Ambulatory Visit (HOSPITAL_COMMUNITY): Payer: Self-pay | Admitting: Cardiology

## 2015-01-11 DIAGNOSIS — I6523 Occlusion and stenosis of bilateral carotid arteries: Secondary | ICD-10-CM

## 2015-01-19 ENCOUNTER — Ambulatory Visit (HOSPITAL_COMMUNITY): Payer: Medicare Other | Attending: Endocrinology | Admitting: *Deleted

## 2015-01-19 DIAGNOSIS — I10 Essential (primary) hypertension: Secondary | ICD-10-CM | POA: Insufficient documentation

## 2015-01-19 DIAGNOSIS — R0989 Other specified symptoms and signs involving the circulatory and respiratory systems: Secondary | ICD-10-CM | POA: Diagnosis not present

## 2015-01-19 DIAGNOSIS — I6523 Occlusion and stenosis of bilateral carotid arteries: Secondary | ICD-10-CM

## 2015-01-19 NOTE — Progress Notes (Signed)
Carotid Duplex Exam Performed 

## 2015-05-14 ENCOUNTER — Other Ambulatory Visit: Payer: Self-pay

## 2015-08-14 ENCOUNTER — Other Ambulatory Visit (HOSPITAL_COMMUNITY): Payer: Self-pay | Admitting: Endocrinology

## 2015-08-14 DIAGNOSIS — I6523 Occlusion and stenosis of bilateral carotid arteries: Secondary | ICD-10-CM

## 2015-08-15 ENCOUNTER — Ambulatory Visit (HOSPITAL_COMMUNITY)
Admission: RE | Admit: 2015-08-15 | Discharge: 2015-08-15 | Disposition: A | Discharge status: Home / Self Care | Payer: Medicare Other | Source: Ambulatory Visit | Attending: Urology | Admitting: Urology

## 2015-08-15 DIAGNOSIS — E785 Hyperlipidemia, unspecified: Secondary | ICD-10-CM | POA: Insufficient documentation

## 2015-08-15 DIAGNOSIS — I6523 Occlusion and stenosis of bilateral carotid arteries: Secondary | ICD-10-CM | POA: Diagnosis present

## 2015-08-15 DIAGNOSIS — I10 Essential (primary) hypertension: Secondary | ICD-10-CM | POA: Insufficient documentation

## 2015-10-18 ENCOUNTER — Other Ambulatory Visit: Payer: Self-pay | Admitting: Oral Surgery

## 2015-11-29 DIAGNOSIS — M1712 Unilateral primary osteoarthritis, left knee: Secondary | ICD-10-CM | POA: Diagnosis not present

## 2015-12-19 DIAGNOSIS — Z124 Encounter for screening for malignant neoplasm of cervix: Secondary | ICD-10-CM | POA: Diagnosis not present

## 2016-02-11 ENCOUNTER — Other Ambulatory Visit: Payer: Self-pay | Admitting: Endocrinology

## 2016-02-11 ENCOUNTER — Other Ambulatory Visit (HOSPITAL_COMMUNITY): Payer: Self-pay | Admitting: Endocrinology

## 2016-02-11 DIAGNOSIS — G500 Trigeminal neuralgia: Secondary | ICD-10-CM | POA: Diagnosis not present

## 2016-02-11 DIAGNOSIS — R591 Generalized enlarged lymph nodes: Secondary | ICD-10-CM

## 2016-02-11 DIAGNOSIS — E784 Other hyperlipidemia: Secondary | ICD-10-CM | POA: Diagnosis not present

## 2016-02-11 DIAGNOSIS — E559 Vitamin D deficiency, unspecified: Secondary | ICD-10-CM | POA: Diagnosis not present

## 2016-02-11 DIAGNOSIS — Z6828 Body mass index (BMI) 28.0-28.9, adult: Secondary | ICD-10-CM | POA: Diagnosis not present

## 2016-02-11 DIAGNOSIS — M810 Age-related osteoporosis without current pathological fracture: Secondary | ICD-10-CM | POA: Diagnosis not present

## 2016-02-11 DIAGNOSIS — I6523 Occlusion and stenosis of bilateral carotid arteries: Secondary | ICD-10-CM

## 2016-02-11 DIAGNOSIS — C73 Malignant neoplasm of thyroid gland: Secondary | ICD-10-CM | POA: Diagnosis not present

## 2016-02-11 DIAGNOSIS — D3610 Benign neoplasm of peripheral nerves and autonomic nervous system, unspecified: Secondary | ICD-10-CM | POA: Diagnosis not present

## 2016-02-11 DIAGNOSIS — R599 Enlarged lymph nodes, unspecified: Secondary | ICD-10-CM

## 2016-02-11 DIAGNOSIS — E890 Postprocedural hypothyroidism: Secondary | ICD-10-CM | POA: Diagnosis not present

## 2016-02-11 DIAGNOSIS — I6529 Occlusion and stenosis of unspecified carotid artery: Secondary | ICD-10-CM | POA: Diagnosis not present

## 2016-02-11 DIAGNOSIS — I10 Essential (primary) hypertension: Secondary | ICD-10-CM | POA: Diagnosis not present

## 2016-02-11 DIAGNOSIS — F329 Major depressive disorder, single episode, unspecified: Secondary | ICD-10-CM | POA: Diagnosis not present

## 2016-02-11 DIAGNOSIS — Z1389 Encounter for screening for other disorder: Secondary | ICD-10-CM | POA: Diagnosis not present

## 2016-02-18 ENCOUNTER — Ambulatory Visit (HOSPITAL_COMMUNITY)
Admission: RE | Admit: 2016-02-18 | Discharge: 2016-02-18 | Disposition: A | Discharge status: Home / Self Care | Payer: PPO | Source: Ambulatory Visit | Attending: Cardiology | Admitting: Cardiology

## 2016-02-18 DIAGNOSIS — R51 Headache: Secondary | ICD-10-CM | POA: Insufficient documentation

## 2016-02-18 DIAGNOSIS — I6502 Occlusion and stenosis of left vertebral artery: Secondary | ICD-10-CM | POA: Diagnosis not present

## 2016-02-18 DIAGNOSIS — E785 Hyperlipidemia, unspecified: Secondary | ICD-10-CM | POA: Diagnosis not present

## 2016-02-18 DIAGNOSIS — G939 Disorder of brain, unspecified: Secondary | ICD-10-CM | POA: Insufficient documentation

## 2016-02-18 DIAGNOSIS — D3610 Benign neoplasm of peripheral nerves and autonomic nervous system, unspecified: Secondary | ICD-10-CM | POA: Diagnosis not present

## 2016-02-18 DIAGNOSIS — I10 Essential (primary) hypertension: Secondary | ICD-10-CM | POA: Insufficient documentation

## 2016-02-18 DIAGNOSIS — I6523 Occlusion and stenosis of bilateral carotid arteries: Secondary | ICD-10-CM | POA: Diagnosis not present

## 2016-02-19 ENCOUNTER — Ambulatory Visit (HOSPITAL_COMMUNITY)
Admission: RE | Admit: 2016-02-19 | Discharge: 2016-02-19 | Disposition: A | Discharge status: Home / Self Care | Payer: PPO | Source: Ambulatory Visit | Attending: Endocrinology | Admitting: Endocrinology

## 2016-02-19 DIAGNOSIS — D3610 Benign neoplasm of peripheral nerves and autonomic nervous system, unspecified: Secondary | ICD-10-CM | POA: Diagnosis not present

## 2016-02-19 DIAGNOSIS — R51 Headache: Secondary | ICD-10-CM | POA: Diagnosis not present

## 2016-02-19 MED ORDER — GADOBENATE DIMEGLUMINE 529 MG/ML IV SOLN
15.0000 mL | Freq: Once | INTRAVENOUS | Status: AC | PRN
  Administered 2016-02-19: 15 mL via INTRAVENOUS

## 2016-02-22 ENCOUNTER — Ambulatory Visit
Admission: RE | Admit: 2016-02-22 | Discharge: 2016-02-22 | Disposition: A | Discharge status: Home / Self Care | Payer: PPO | Source: Ambulatory Visit | Attending: Endocrinology | Admitting: Endocrinology

## 2016-02-22 DIAGNOSIS — R599 Enlarged lymph nodes, unspecified: Secondary | ICD-10-CM | POA: Diagnosis not present

## 2016-02-22 DIAGNOSIS — R591 Generalized enlarged lymph nodes: Secondary | ICD-10-CM

## 2016-02-22 MED ORDER — IOPAMIDOL (ISOVUE-300) INJECTION 61%
75.0000 mL | Freq: Once | INTRAVENOUS | Status: AC | PRN
  Administered 2016-02-22: 75 mL via INTRAVENOUS

## 2016-03-26 DIAGNOSIS — G939 Disorder of brain, unspecified: Secondary | ICD-10-CM | POA: Diagnosis not present

## 2016-03-26 DIAGNOSIS — G500 Trigeminal neuralgia: Secondary | ICD-10-CM | POA: Diagnosis not present

## 2016-03-26 DIAGNOSIS — R51 Headache: Secondary | ICD-10-CM | POA: Diagnosis not present

## 2016-07-02 ENCOUNTER — Other Ambulatory Visit: Payer: Self-pay | Admitting: Obstetrics and Gynecology

## 2016-07-02 DIAGNOSIS — Z1231 Encounter for screening mammogram for malignant neoplasm of breast: Secondary | ICD-10-CM

## 2016-07-03 ENCOUNTER — Ambulatory Visit
Admission: RE | Admit: 2016-07-03 | Discharge: 2016-07-03 | Disposition: A | Discharge status: Home / Self Care | Payer: PPO | Source: Ambulatory Visit | Attending: Obstetrics and Gynecology | Admitting: Obstetrics and Gynecology

## 2016-07-03 DIAGNOSIS — Z1231 Encounter for screening mammogram for malignant neoplasm of breast: Secondary | ICD-10-CM | POA: Diagnosis not present

## 2016-07-30 DIAGNOSIS — Z6827 Body mass index (BMI) 27.0-27.9, adult: Secondary | ICD-10-CM | POA: Diagnosis not present

## 2016-07-30 DIAGNOSIS — E784 Other hyperlipidemia: Secondary | ICD-10-CM | POA: Diagnosis not present

## 2016-07-30 DIAGNOSIS — Z0000 Encounter for general adult medical examination without abnormal findings: Secondary | ICD-10-CM | POA: Diagnosis not present

## 2016-07-30 DIAGNOSIS — R221 Localized swelling, mass and lump, neck: Secondary | ICD-10-CM | POA: Diagnosis not present

## 2016-07-30 DIAGNOSIS — I6529 Occlusion and stenosis of unspecified carotid artery: Secondary | ICD-10-CM | POA: Diagnosis not present

## 2016-07-30 DIAGNOSIS — D3610 Benign neoplasm of peripheral nerves and autonomic nervous system, unspecified: Secondary | ICD-10-CM | POA: Diagnosis not present

## 2016-07-30 DIAGNOSIS — I10 Essential (primary) hypertension: Secondary | ICD-10-CM | POA: Diagnosis not present

## 2016-07-30 DIAGNOSIS — G500 Trigeminal neuralgia: Secondary | ICD-10-CM | POA: Diagnosis not present

## 2016-07-30 DIAGNOSIS — D497 Neoplasm of unspecified behavior of endocrine glands and other parts of nervous system: Secondary | ICD-10-CM | POA: Diagnosis not present

## 2016-07-30 DIAGNOSIS — E890 Postprocedural hypothyroidism: Secondary | ICD-10-CM | POA: Diagnosis not present

## 2016-07-30 DIAGNOSIS — E559 Vitamin D deficiency, unspecified: Secondary | ICD-10-CM | POA: Diagnosis not present

## 2016-07-31 DIAGNOSIS — H25013 Cortical age-related cataract, bilateral: Secondary | ICD-10-CM | POA: Diagnosis not present

## 2016-07-31 DIAGNOSIS — H2513 Age-related nuclear cataract, bilateral: Secondary | ICD-10-CM | POA: Diagnosis not present

## 2016-07-31 DIAGNOSIS — H52203 Unspecified astigmatism, bilateral: Secondary | ICD-10-CM | POA: Diagnosis not present

## 2016-08-07 DIAGNOSIS — Z85828 Personal history of other malignant neoplasm of skin: Secondary | ICD-10-CM | POA: Diagnosis not present

## 2016-08-07 DIAGNOSIS — D225 Melanocytic nevi of trunk: Secondary | ICD-10-CM | POA: Diagnosis not present

## 2016-08-07 DIAGNOSIS — L821 Other seborrheic keratosis: Secondary | ICD-10-CM | POA: Diagnosis not present

## 2016-08-07 DIAGNOSIS — L570 Actinic keratosis: Secondary | ICD-10-CM | POA: Diagnosis not present

## 2016-08-07 DIAGNOSIS — D1801 Hemangioma of skin and subcutaneous tissue: Secondary | ICD-10-CM | POA: Diagnosis not present

## 2016-08-07 DIAGNOSIS — D2272 Melanocytic nevi of left lower limb, including hip: Secondary | ICD-10-CM | POA: Diagnosis not present

## 2016-08-12 ENCOUNTER — Other Ambulatory Visit (HOSPITAL_COMMUNITY): Payer: Self-pay | Admitting: Endocrinology

## 2016-08-12 DIAGNOSIS — I6523 Occlusion and stenosis of bilateral carotid arteries: Secondary | ICD-10-CM

## 2016-08-15 DIAGNOSIS — R040 Epistaxis: Secondary | ICD-10-CM | POA: Diagnosis not present

## 2016-08-15 DIAGNOSIS — Z85850 Personal history of malignant neoplasm of thyroid: Secondary | ICD-10-CM | POA: Diagnosis not present

## 2016-08-15 DIAGNOSIS — J342 Deviated nasal septum: Secondary | ICD-10-CM | POA: Diagnosis not present

## 2016-08-15 DIAGNOSIS — J302 Other seasonal allergic rhinitis: Secondary | ICD-10-CM | POA: Diagnosis not present

## 2016-08-18 ENCOUNTER — Other Ambulatory Visit: Payer: Self-pay | Admitting: Otolaryngology

## 2016-08-18 ENCOUNTER — Ambulatory Visit (HOSPITAL_COMMUNITY)
Admission: RE | Admit: 2016-08-18 | Discharge: 2016-08-18 | Disposition: A | Discharge status: Home / Self Care | Payer: PPO | Source: Ambulatory Visit | Attending: Cardiovascular Disease | Admitting: Cardiovascular Disease

## 2016-08-18 DIAGNOSIS — Z85850 Personal history of malignant neoplasm of thyroid: Secondary | ICD-10-CM

## 2016-08-18 DIAGNOSIS — I6523 Occlusion and stenosis of bilateral carotid arteries: Secondary | ICD-10-CM | POA: Insufficient documentation

## 2016-08-18 DIAGNOSIS — I10 Essential (primary) hypertension: Secondary | ICD-10-CM | POA: Diagnosis not present

## 2016-08-26 ENCOUNTER — Ambulatory Visit
Admission: RE | Admit: 2016-08-26 | Discharge: 2016-08-26 | Disposition: A | Discharge status: Home / Self Care | Payer: PPO | Source: Ambulatory Visit | Attending: Otolaryngology | Admitting: Otolaryngology

## 2016-08-26 DIAGNOSIS — C73 Malignant neoplasm of thyroid gland: Secondary | ICD-10-CM | POA: Diagnosis not present

## 2016-08-26 DIAGNOSIS — Z85850 Personal history of malignant neoplasm of thyroid: Secondary | ICD-10-CM

## 2016-09-09 DIAGNOSIS — G500 Trigeminal neuralgia: Secondary | ICD-10-CM | POA: Diagnosis not present

## 2016-09-09 DIAGNOSIS — Z510 Encounter for antineoplastic radiation therapy: Secondary | ICD-10-CM | POA: Diagnosis not present

## 2017-01-26 DIAGNOSIS — I10 Essential (primary) hypertension: Secondary | ICD-10-CM | POA: Diagnosis not present

## 2017-01-26 DIAGNOSIS — Z6828 Body mass index (BMI) 28.0-28.9, adult: Secondary | ICD-10-CM | POA: Diagnosis not present

## 2017-01-26 DIAGNOSIS — R221 Localized swelling, mass and lump, neck: Secondary | ICD-10-CM | POA: Diagnosis not present

## 2017-01-26 DIAGNOSIS — Z1389 Encounter for screening for other disorder: Secondary | ICD-10-CM | POA: Diagnosis not present

## 2017-01-26 DIAGNOSIS — E890 Postprocedural hypothyroidism: Secondary | ICD-10-CM | POA: Diagnosis not present

## 2017-01-26 DIAGNOSIS — D3610 Benign neoplasm of peripheral nerves and autonomic nervous system, unspecified: Secondary | ICD-10-CM | POA: Diagnosis not present

## 2017-01-26 DIAGNOSIS — D497 Neoplasm of unspecified behavior of endocrine glands and other parts of nervous system: Secondary | ICD-10-CM | POA: Diagnosis not present

## 2017-01-26 DIAGNOSIS — E784 Other hyperlipidemia: Secondary | ICD-10-CM | POA: Diagnosis not present

## 2017-01-26 DIAGNOSIS — G500 Trigeminal neuralgia: Secondary | ICD-10-CM | POA: Diagnosis not present

## 2017-01-26 DIAGNOSIS — I6529 Occlusion and stenosis of unspecified carotid artery: Secondary | ICD-10-CM | POA: Diagnosis not present

## 2017-01-26 DIAGNOSIS — C73 Malignant neoplasm of thyroid gland: Secondary | ICD-10-CM | POA: Diagnosis not present

## 2017-01-27 DIAGNOSIS — Z8669 Personal history of other diseases of the nervous system and sense organs: Secondary | ICD-10-CM | POA: Diagnosis not present

## 2017-01-27 DIAGNOSIS — G9389 Other specified disorders of brain: Secondary | ICD-10-CM | POA: Diagnosis not present

## 2017-01-27 DIAGNOSIS — G939 Disorder of brain, unspecified: Secondary | ICD-10-CM | POA: Diagnosis not present

## 2017-01-27 DIAGNOSIS — Z9889 Other specified postprocedural states: Secondary | ICD-10-CM | POA: Diagnosis not present

## 2017-01-27 DIAGNOSIS — Z923 Personal history of irradiation: Secondary | ICD-10-CM | POA: Diagnosis not present

## 2017-01-28 ENCOUNTER — Other Ambulatory Visit (HOSPITAL_COMMUNITY): Payer: Self-pay | Admitting: Endocrinology

## 2017-01-28 DIAGNOSIS — I6523 Occlusion and stenosis of bilateral carotid arteries: Secondary | ICD-10-CM

## 2017-02-03 ENCOUNTER — Ambulatory Visit (HOSPITAL_COMMUNITY)
Admission: RE | Admit: 2017-02-03 | Discharge: 2017-02-03 | Disposition: A | Discharge status: Home / Self Care | Payer: PPO | Source: Ambulatory Visit | Attending: Cardiovascular Disease | Admitting: Cardiovascular Disease

## 2017-02-03 DIAGNOSIS — I6523 Occlusion and stenosis of bilateral carotid arteries: Secondary | ICD-10-CM | POA: Diagnosis not present

## 2017-05-07 ENCOUNTER — Encounter: Payer: Self-pay | Admitting: Gastroenterology

## 2017-08-03 DIAGNOSIS — I6529 Occlusion and stenosis of unspecified carotid artery: Secondary | ICD-10-CM | POA: Diagnosis not present

## 2017-08-03 DIAGNOSIS — D497 Neoplasm of unspecified behavior of endocrine glands and other parts of nervous system: Secondary | ICD-10-CM | POA: Diagnosis not present

## 2017-08-03 DIAGNOSIS — E890 Postprocedural hypothyroidism: Secondary | ICD-10-CM | POA: Diagnosis not present

## 2017-08-03 DIAGNOSIS — F3289 Other specified depressive episodes: Secondary | ICD-10-CM | POA: Diagnosis not present

## 2017-08-03 DIAGNOSIS — Z23 Encounter for immunization: Secondary | ICD-10-CM | POA: Diagnosis not present

## 2017-08-03 DIAGNOSIS — C73 Malignant neoplasm of thyroid gland: Secondary | ICD-10-CM | POA: Diagnosis not present

## 2017-08-03 DIAGNOSIS — M810 Age-related osteoporosis without current pathological fracture: Secondary | ICD-10-CM | POA: Diagnosis not present

## 2017-08-03 DIAGNOSIS — I10 Essential (primary) hypertension: Secondary | ICD-10-CM | POA: Diagnosis not present

## 2017-08-03 DIAGNOSIS — E559 Vitamin D deficiency, unspecified: Secondary | ICD-10-CM | POA: Diagnosis not present

## 2017-08-03 DIAGNOSIS — D3610 Benign neoplasm of peripheral nerves and autonomic nervous system, unspecified: Secondary | ICD-10-CM | POA: Diagnosis not present

## 2017-08-03 DIAGNOSIS — G500 Trigeminal neuralgia: Secondary | ICD-10-CM | POA: Diagnosis not present

## 2017-08-03 DIAGNOSIS — E784 Other hyperlipidemia: Secondary | ICD-10-CM | POA: Diagnosis not present

## 2017-08-03 DIAGNOSIS — Z6825 Body mass index (BMI) 25.0-25.9, adult: Secondary | ICD-10-CM | POA: Diagnosis not present

## 2017-08-05 DIAGNOSIS — M810 Age-related osteoporosis without current pathological fracture: Secondary | ICD-10-CM | POA: Diagnosis not present

## 2017-08-07 DIAGNOSIS — D2271 Melanocytic nevi of right lower limb, including hip: Secondary | ICD-10-CM | POA: Diagnosis not present

## 2017-08-07 DIAGNOSIS — D1801 Hemangioma of skin and subcutaneous tissue: Secondary | ICD-10-CM | POA: Diagnosis not present

## 2017-08-07 DIAGNOSIS — L570 Actinic keratosis: Secondary | ICD-10-CM | POA: Diagnosis not present

## 2017-08-07 DIAGNOSIS — Z85828 Personal history of other malignant neoplasm of skin: Secondary | ICD-10-CM | POA: Diagnosis not present

## 2017-08-07 DIAGNOSIS — D692 Other nonthrombocytopenic purpura: Secondary | ICD-10-CM | POA: Diagnosis not present

## 2017-08-07 DIAGNOSIS — L821 Other seborrheic keratosis: Secondary | ICD-10-CM | POA: Diagnosis not present

## 2017-08-07 DIAGNOSIS — D225 Melanocytic nevi of trunk: Secondary | ICD-10-CM | POA: Diagnosis not present

## 2017-08-07 DIAGNOSIS — D2272 Melanocytic nevi of left lower limb, including hip: Secondary | ICD-10-CM | POA: Diagnosis not present

## 2017-08-16 DIAGNOSIS — Z1212 Encounter for screening for malignant neoplasm of rectum: Secondary | ICD-10-CM | POA: Diagnosis not present

## 2017-08-16 DIAGNOSIS — Z1211 Encounter for screening for malignant neoplasm of colon: Secondary | ICD-10-CM | POA: Diagnosis not present

## 2017-08-19 ENCOUNTER — Other Ambulatory Visit: Payer: Self-pay | Admitting: Obstetrics and Gynecology

## 2017-08-19 DIAGNOSIS — Z1231 Encounter for screening mammogram for malignant neoplasm of breast: Secondary | ICD-10-CM

## 2017-08-26 ENCOUNTER — Ambulatory Visit
Admission: RE | Admit: 2017-08-26 | Discharge: 2017-08-26 | Disposition: A | Discharge status: Home / Self Care | Payer: PPO | Source: Ambulatory Visit | Attending: Obstetrics and Gynecology | Admitting: Obstetrics and Gynecology

## 2017-08-26 DIAGNOSIS — Z1231 Encounter for screening mammogram for malignant neoplasm of breast: Secondary | ICD-10-CM

## 2017-09-03 DIAGNOSIS — Z01419 Encounter for gynecological examination (general) (routine) without abnormal findings: Secondary | ICD-10-CM | POA: Diagnosis not present

## 2017-09-11 DIAGNOSIS — H52203 Unspecified astigmatism, bilateral: Secondary | ICD-10-CM | POA: Diagnosis not present

## 2017-09-11 DIAGNOSIS — H25013 Cortical age-related cataract, bilateral: Secondary | ICD-10-CM | POA: Diagnosis not present

## 2018-02-09 DIAGNOSIS — D3610 Benign neoplasm of peripheral nerves and autonomic nervous system, unspecified: Secondary | ICD-10-CM | POA: Diagnosis not present

## 2018-02-09 DIAGNOSIS — I10 Essential (primary) hypertension: Secondary | ICD-10-CM | POA: Diagnosis not present

## 2018-02-09 DIAGNOSIS — Z6824 Body mass index (BMI) 24.0-24.9, adult: Secondary | ICD-10-CM | POA: Diagnosis not present

## 2018-02-09 DIAGNOSIS — D497 Neoplasm of unspecified behavior of endocrine glands and other parts of nervous system: Secondary | ICD-10-CM | POA: Diagnosis not present

## 2018-02-09 DIAGNOSIS — E785 Hyperlipidemia, unspecified: Secondary | ICD-10-CM | POA: Diagnosis not present

## 2018-02-09 DIAGNOSIS — I6529 Occlusion and stenosis of unspecified carotid artery: Secondary | ICD-10-CM | POA: Diagnosis not present

## 2018-02-09 DIAGNOSIS — F329 Major depressive disorder, single episode, unspecified: Secondary | ICD-10-CM | POA: Diagnosis not present

## 2018-02-09 DIAGNOSIS — G629 Polyneuropathy, unspecified: Secondary | ICD-10-CM | POA: Diagnosis not present

## 2018-02-09 DIAGNOSIS — Z1389 Encounter for screening for other disorder: Secondary | ICD-10-CM | POA: Diagnosis not present

## 2018-02-09 DIAGNOSIS — E890 Postprocedural hypothyroidism: Secondary | ICD-10-CM | POA: Diagnosis not present

## 2018-02-09 DIAGNOSIS — G500 Trigeminal neuralgia: Secondary | ICD-10-CM | POA: Diagnosis not present

## 2018-02-09 DIAGNOSIS — E559 Vitamin D deficiency, unspecified: Secondary | ICD-10-CM | POA: Diagnosis not present

## 2018-02-22 ENCOUNTER — Other Ambulatory Visit (HOSPITAL_COMMUNITY): Payer: Self-pay | Admitting: Endocrinology

## 2018-02-22 DIAGNOSIS — I6523 Occlusion and stenosis of bilateral carotid arteries: Secondary | ICD-10-CM

## 2018-02-24 ENCOUNTER — Ambulatory Visit (HOSPITAL_COMMUNITY)
Admission: RE | Admit: 2018-02-24 | Discharge: 2018-02-24 | Disposition: A | Discharge status: Home / Self Care | Payer: PPO | Source: Ambulatory Visit | Attending: Cardiovascular Disease | Admitting: Cardiovascular Disease

## 2018-02-24 DIAGNOSIS — I6523 Occlusion and stenosis of bilateral carotid arteries: Secondary | ICD-10-CM

## 2018-04-16 DIAGNOSIS — M1712 Unilateral primary osteoarthritis, left knee: Secondary | ICD-10-CM | POA: Diagnosis not present

## 2018-04-30 DIAGNOSIS — M1712 Unilateral primary osteoarthritis, left knee: Secondary | ICD-10-CM | POA: Diagnosis not present

## 2018-05-07 DIAGNOSIS — M1712 Unilateral primary osteoarthritis, left knee: Secondary | ICD-10-CM | POA: Diagnosis not present

## 2018-05-14 DIAGNOSIS — M1712 Unilateral primary osteoarthritis, left knee: Secondary | ICD-10-CM | POA: Diagnosis not present

## 2018-08-24 DIAGNOSIS — E559 Vitamin D deficiency, unspecified: Secondary | ICD-10-CM | POA: Diagnosis not present

## 2018-08-24 DIAGNOSIS — M859 Disorder of bone density and structure, unspecified: Secondary | ICD-10-CM | POA: Diagnosis not present

## 2018-08-24 DIAGNOSIS — I10 Essential (primary) hypertension: Secondary | ICD-10-CM | POA: Diagnosis not present

## 2018-08-24 DIAGNOSIS — G500 Trigeminal neuralgia: Secondary | ICD-10-CM | POA: Diagnosis not present

## 2018-08-24 DIAGNOSIS — F329 Major depressive disorder, single episode, unspecified: Secondary | ICD-10-CM | POA: Diagnosis not present

## 2018-08-24 DIAGNOSIS — Z6824 Body mass index (BMI) 24.0-24.9, adult: Secondary | ICD-10-CM | POA: Diagnosis not present

## 2018-08-24 DIAGNOSIS — M810 Age-related osteoporosis without current pathological fracture: Secondary | ICD-10-CM | POA: Diagnosis not present

## 2018-08-24 DIAGNOSIS — E7849 Other hyperlipidemia: Secondary | ICD-10-CM | POA: Diagnosis not present

## 2018-08-24 DIAGNOSIS — D497 Neoplasm of unspecified behavior of endocrine glands and other parts of nervous system: Secondary | ICD-10-CM | POA: Diagnosis not present

## 2018-08-24 DIAGNOSIS — E890 Postprocedural hypothyroidism: Secondary | ICD-10-CM | POA: Diagnosis not present

## 2018-08-24 DIAGNOSIS — D3610 Benign neoplasm of peripheral nerves and autonomic nervous system, unspecified: Secondary | ICD-10-CM | POA: Diagnosis not present

## 2018-08-24 DIAGNOSIS — I6529 Occlusion and stenosis of unspecified carotid artery: Secondary | ICD-10-CM | POA: Diagnosis not present

## 2018-08-26 ENCOUNTER — Other Ambulatory Visit: Payer: Self-pay | Admitting: Obstetrics and Gynecology

## 2018-08-26 DIAGNOSIS — Z1231 Encounter for screening mammogram for malignant neoplasm of breast: Secondary | ICD-10-CM

## 2018-08-31 DIAGNOSIS — Z923 Personal history of irradiation: Secondary | ICD-10-CM | POA: Diagnosis not present

## 2018-08-31 DIAGNOSIS — Z8669 Personal history of other diseases of the nervous system and sense organs: Secondary | ICD-10-CM | POA: Diagnosis not present

## 2018-08-31 DIAGNOSIS — R202 Paresthesia of skin: Secondary | ICD-10-CM | POA: Diagnosis not present

## 2018-08-31 DIAGNOSIS — R200 Anesthesia of skin: Secondary | ICD-10-CM | POA: Diagnosis not present

## 2018-08-31 DIAGNOSIS — Z85850 Personal history of malignant neoplasm of thyroid: Secondary | ICD-10-CM | POA: Diagnosis not present

## 2018-08-31 DIAGNOSIS — G939 Disorder of brain, unspecified: Secondary | ICD-10-CM | POA: Diagnosis not present

## 2018-09-01 ENCOUNTER — Other Ambulatory Visit (HOSPITAL_COMMUNITY): Payer: Self-pay | Admitting: Physician Assistant

## 2018-09-01 DIAGNOSIS — G939 Disorder of brain, unspecified: Secondary | ICD-10-CM

## 2018-09-01 DIAGNOSIS — R200 Anesthesia of skin: Secondary | ICD-10-CM

## 2018-09-01 DIAGNOSIS — R202 Paresthesia of skin: Secondary | ICD-10-CM

## 2018-09-06 ENCOUNTER — Ambulatory Visit (HOSPITAL_COMMUNITY)
Admission: RE | Admit: 2018-09-06 | Discharge: 2018-09-06 | Disposition: A | Discharge status: Home / Self Care | Payer: PPO | Source: Ambulatory Visit | Attending: Physician Assistant | Admitting: Physician Assistant

## 2018-09-06 DIAGNOSIS — R200 Anesthesia of skin: Secondary | ICD-10-CM

## 2018-09-06 DIAGNOSIS — M50223 Other cervical disc displacement at C6-C7 level: Secondary | ICD-10-CM | POA: Diagnosis not present

## 2018-09-06 DIAGNOSIS — G939 Disorder of brain, unspecified: Secondary | ICD-10-CM

## 2018-09-06 DIAGNOSIS — M4802 Spinal stenosis, cervical region: Secondary | ICD-10-CM | POA: Diagnosis not present

## 2018-09-06 DIAGNOSIS — R202 Paresthesia of skin: Secondary | ICD-10-CM

## 2018-09-06 DIAGNOSIS — I679 Cerebrovascular disease, unspecified: Secondary | ICD-10-CM | POA: Diagnosis not present

## 2018-09-06 LAB — POCT I-STAT CREATININE: Creatinine, Ser: 1.2 mg/dL — ABNORMAL HIGH (ref 0.44–1.00)

## 2018-09-06 MED ORDER — GADOBUTROL 1 MMOL/ML IV SOLN
7.5000 mL | Freq: Once | INTRAVENOUS | Status: AC | PRN
  Administered 2018-09-06: 7 mL via INTRAVENOUS

## 2018-09-10 DIAGNOSIS — Z79899 Other long term (current) drug therapy: Secondary | ICD-10-CM | POA: Diagnosis not present

## 2018-09-10 DIAGNOSIS — G500 Trigeminal neuralgia: Secondary | ICD-10-CM | POA: Diagnosis not present

## 2018-09-10 DIAGNOSIS — D496 Neoplasm of unspecified behavior of brain: Secondary | ICD-10-CM | POA: Diagnosis not present

## 2018-09-10 DIAGNOSIS — Z8669 Personal history of other diseases of the nervous system and sense organs: Secondary | ICD-10-CM | POA: Diagnosis not present

## 2018-09-10 DIAGNOSIS — R200 Anesthesia of skin: Secondary | ICD-10-CM | POA: Diagnosis not present

## 2018-09-10 DIAGNOSIS — G9389 Other specified disorders of brain: Secondary | ICD-10-CM | POA: Diagnosis not present

## 2018-09-14 DIAGNOSIS — D225 Melanocytic nevi of trunk: Secondary | ICD-10-CM | POA: Diagnosis not present

## 2018-09-14 DIAGNOSIS — L814 Other melanin hyperpigmentation: Secondary | ICD-10-CM | POA: Diagnosis not present

## 2018-09-14 DIAGNOSIS — D692 Other nonthrombocytopenic purpura: Secondary | ICD-10-CM | POA: Diagnosis not present

## 2018-09-14 DIAGNOSIS — D2271 Melanocytic nevi of right lower limb, including hip: Secondary | ICD-10-CM | POA: Diagnosis not present

## 2018-09-14 DIAGNOSIS — L570 Actinic keratosis: Secondary | ICD-10-CM | POA: Diagnosis not present

## 2018-09-14 DIAGNOSIS — Z85828 Personal history of other malignant neoplasm of skin: Secondary | ICD-10-CM | POA: Diagnosis not present

## 2018-09-14 DIAGNOSIS — D2272 Melanocytic nevi of left lower limb, including hip: Secondary | ICD-10-CM | POA: Diagnosis not present

## 2018-09-14 DIAGNOSIS — L821 Other seborrheic keratosis: Secondary | ICD-10-CM | POA: Diagnosis not present

## 2018-09-14 DIAGNOSIS — D1801 Hemangioma of skin and subcutaneous tissue: Secondary | ICD-10-CM | POA: Diagnosis not present

## 2018-09-15 DIAGNOSIS — Z124 Encounter for screening for malignant neoplasm of cervix: Secondary | ICD-10-CM | POA: Diagnosis not present

## 2018-09-28 ENCOUNTER — Ambulatory Visit
Admission: RE | Admit: 2018-09-28 | Discharge: 2018-09-28 | Disposition: A | Discharge status: Home / Self Care | Payer: PRIVATE HEALTH INSURANCE | Source: Ambulatory Visit | Attending: Obstetrics and Gynecology | Admitting: Obstetrics and Gynecology

## 2018-09-28 DIAGNOSIS — Z1231 Encounter for screening mammogram for malignant neoplasm of breast: Secondary | ICD-10-CM

## 2018-11-18 DIAGNOSIS — I6529 Occlusion and stenosis of unspecified carotid artery: Secondary | ICD-10-CM | POA: Diagnosis not present

## 2018-11-18 DIAGNOSIS — D497 Neoplasm of unspecified behavior of endocrine glands and other parts of nervous system: Secondary | ICD-10-CM | POA: Diagnosis not present

## 2018-11-18 DIAGNOSIS — G500 Trigeminal neuralgia: Secondary | ICD-10-CM | POA: Diagnosis not present

## 2018-11-18 DIAGNOSIS — D3610 Benign neoplasm of peripheral nerves and autonomic nervous system, unspecified: Secondary | ICD-10-CM | POA: Diagnosis not present

## 2018-11-18 DIAGNOSIS — M859 Disorder of bone density and structure, unspecified: Secondary | ICD-10-CM | POA: Diagnosis not present

## 2018-11-18 DIAGNOSIS — Z6825 Body mass index (BMI) 25.0-25.9, adult: Secondary | ICD-10-CM | POA: Diagnosis not present

## 2018-11-18 DIAGNOSIS — I10 Essential (primary) hypertension: Secondary | ICD-10-CM | POA: Diagnosis not present

## 2018-11-18 DIAGNOSIS — E890 Postprocedural hypothyroidism: Secondary | ICD-10-CM | POA: Diagnosis not present

## 2018-11-18 DIAGNOSIS — E7849 Other hyperlipidemia: Secondary | ICD-10-CM | POA: Diagnosis not present

## 2018-11-18 DIAGNOSIS — F329 Major depressive disorder, single episode, unspecified: Secondary | ICD-10-CM | POA: Diagnosis not present

## 2018-11-18 DIAGNOSIS — M810 Age-related osteoporosis without current pathological fracture: Secondary | ICD-10-CM | POA: Diagnosis not present

## 2018-11-18 DIAGNOSIS — E559 Vitamin D deficiency, unspecified: Secondary | ICD-10-CM | POA: Diagnosis not present

## 2018-12-15 DIAGNOSIS — M1712 Unilateral primary osteoarthritis, left knee: Secondary | ICD-10-CM | POA: Diagnosis not present

## 2018-12-23 DIAGNOSIS — M1712 Unilateral primary osteoarthritis, left knee: Secondary | ICD-10-CM | POA: Diagnosis not present

## 2018-12-28 DIAGNOSIS — Z85828 Personal history of other malignant neoplasm of skin: Secondary | ICD-10-CM | POA: Diagnosis not present

## 2018-12-28 DIAGNOSIS — D485 Neoplasm of uncertain behavior of skin: Secondary | ICD-10-CM | POA: Diagnosis not present

## 2018-12-28 DIAGNOSIS — L821 Other seborrheic keratosis: Secondary | ICD-10-CM | POA: Diagnosis not present

## 2018-12-30 DIAGNOSIS — M1712 Unilateral primary osteoarthritis, left knee: Secondary | ICD-10-CM | POA: Diagnosis not present

## 2019-03-21 DIAGNOSIS — M5412 Radiculopathy, cervical region: Secondary | ICD-10-CM | POA: Diagnosis not present

## 2019-03-21 DIAGNOSIS — D497 Neoplasm of unspecified behavior of endocrine glands and other parts of nervous system: Secondary | ICD-10-CM | POA: Diagnosis not present

## 2019-03-21 DIAGNOSIS — F329 Major depressive disorder, single episode, unspecified: Secondary | ICD-10-CM | POA: Diagnosis not present

## 2019-03-21 DIAGNOSIS — E890 Postprocedural hypothyroidism: Secondary | ICD-10-CM | POA: Diagnosis not present

## 2019-03-21 DIAGNOSIS — E785 Hyperlipidemia, unspecified: Secondary | ICD-10-CM | POA: Diagnosis not present

## 2019-03-21 DIAGNOSIS — D3610 Benign neoplasm of peripheral nerves and autonomic nervous system, unspecified: Secondary | ICD-10-CM | POA: Diagnosis not present

## 2019-03-21 DIAGNOSIS — I10 Essential (primary) hypertension: Secondary | ICD-10-CM | POA: Diagnosis not present

## 2019-03-21 DIAGNOSIS — G500 Trigeminal neuralgia: Secondary | ICD-10-CM | POA: Diagnosis not present

## 2019-03-21 DIAGNOSIS — E559 Vitamin D deficiency, unspecified: Secondary | ICD-10-CM | POA: Diagnosis not present

## 2019-03-21 DIAGNOSIS — I6529 Occlusion and stenosis of unspecified carotid artery: Secondary | ICD-10-CM | POA: Diagnosis not present

## 2019-03-21 DIAGNOSIS — M8580 Other specified disorders of bone density and structure, unspecified site: Secondary | ICD-10-CM | POA: Diagnosis not present

## 2019-04-01 DIAGNOSIS — E890 Postprocedural hypothyroidism: Secondary | ICD-10-CM | POA: Diagnosis not present

## 2019-04-01 DIAGNOSIS — E559 Vitamin D deficiency, unspecified: Secondary | ICD-10-CM | POA: Diagnosis not present

## 2019-04-01 DIAGNOSIS — Z79899 Other long term (current) drug therapy: Secondary | ICD-10-CM | POA: Diagnosis not present

## 2019-04-01 DIAGNOSIS — E7849 Other hyperlipidemia: Secondary | ICD-10-CM | POA: Diagnosis not present

## 2019-04-01 DIAGNOSIS — I10 Essential (primary) hypertension: Secondary | ICD-10-CM | POA: Diagnosis not present

## 2019-04-01 DIAGNOSIS — C73 Malignant neoplasm of thyroid gland: Secondary | ICD-10-CM | POA: Diagnosis not present

## 2019-06-28 ENCOUNTER — Other Ambulatory Visit (HOSPITAL_COMMUNITY): Payer: Self-pay | Admitting: Endocrinology

## 2019-06-28 DIAGNOSIS — I6523 Occlusion and stenosis of bilateral carotid arteries: Secondary | ICD-10-CM

## 2019-06-30 ENCOUNTER — Ambulatory Visit (HOSPITAL_COMMUNITY)
Admission: RE | Admit: 2019-06-30 | Discharge: 2019-06-30 | Disposition: A | Discharge status: Home / Self Care | Payer: PPO | Source: Ambulatory Visit | Attending: Cardiovascular Disease | Admitting: Cardiovascular Disease

## 2019-06-30 ENCOUNTER — Other Ambulatory Visit: Payer: Self-pay

## 2019-06-30 DIAGNOSIS — I6523 Occlusion and stenosis of bilateral carotid arteries: Secondary | ICD-10-CM | POA: Diagnosis not present

## 2019-09-02 DIAGNOSIS — Z79899 Other long term (current) drug therapy: Secondary | ICD-10-CM | POA: Diagnosis not present

## 2019-09-02 DIAGNOSIS — I6529 Occlusion and stenosis of unspecified carotid artery: Secondary | ICD-10-CM | POA: Diagnosis not present

## 2019-09-02 DIAGNOSIS — R42 Dizziness and giddiness: Secondary | ICD-10-CM | POA: Diagnosis not present

## 2019-09-02 DIAGNOSIS — I358 Other nonrheumatic aortic valve disorders: Secondary | ICD-10-CM | POA: Diagnosis not present

## 2019-09-02 DIAGNOSIS — E559 Vitamin D deficiency, unspecified: Secondary | ICD-10-CM | POA: Diagnosis not present

## 2019-09-02 DIAGNOSIS — I10 Essential (primary) hypertension: Secondary | ICD-10-CM | POA: Diagnosis not present

## 2019-09-02 DIAGNOSIS — E890 Postprocedural hypothyroidism: Secondary | ICD-10-CM | POA: Diagnosis not present

## 2019-09-02 DIAGNOSIS — D3610 Benign neoplasm of peripheral nerves and autonomic nervous system, unspecified: Secondary | ICD-10-CM | POA: Diagnosis not present

## 2019-09-05 ENCOUNTER — Other Ambulatory Visit: Payer: Self-pay | Admitting: Endocrinology

## 2019-09-05 DIAGNOSIS — I358 Other nonrheumatic aortic valve disorders: Secondary | ICD-10-CM

## 2019-09-13 ENCOUNTER — Other Ambulatory Visit: Payer: Self-pay | Admitting: Obstetrics and Gynecology

## 2019-09-13 DIAGNOSIS — Z1231 Encounter for screening mammogram for malignant neoplasm of breast: Secondary | ICD-10-CM

## 2019-09-14 ENCOUNTER — Other Ambulatory Visit: Payer: Self-pay

## 2019-09-14 ENCOUNTER — Ambulatory Visit (INDEPENDENT_AMBULATORY_CARE_PROVIDER_SITE_OTHER): Payer: PPO

## 2019-09-14 DIAGNOSIS — I358 Other nonrheumatic aortic valve disorders: Secondary | ICD-10-CM | POA: Diagnosis not present

## 2019-09-15 DIAGNOSIS — M1712 Unilateral primary osteoarthritis, left knee: Secondary | ICD-10-CM | POA: Diagnosis not present

## 2019-09-15 DIAGNOSIS — M25561 Pain in right knee: Secondary | ICD-10-CM | POA: Diagnosis not present

## 2019-09-20 DIAGNOSIS — M1712 Unilateral primary osteoarthritis, left knee: Secondary | ICD-10-CM | POA: Diagnosis not present

## 2019-09-21 DIAGNOSIS — M419 Scoliosis, unspecified: Secondary | ICD-10-CM | POA: Diagnosis not present

## 2019-09-21 DIAGNOSIS — M4056 Lordosis, unspecified, lumbar region: Secondary | ICD-10-CM | POA: Diagnosis not present

## 2019-09-21 DIAGNOSIS — M4802 Spinal stenosis, cervical region: Secondary | ICD-10-CM | POA: Diagnosis not present

## 2019-09-21 DIAGNOSIS — M545 Low back pain: Secondary | ICD-10-CM | POA: Diagnosis not present

## 2019-09-21 DIAGNOSIS — M4310 Spondylolisthesis, site unspecified: Secondary | ICD-10-CM | POA: Diagnosis not present

## 2019-09-21 DIAGNOSIS — M546 Pain in thoracic spine: Secondary | ICD-10-CM | POA: Diagnosis not present

## 2019-09-21 DIAGNOSIS — M40204 Unspecified kyphosis, thoracic region: Secondary | ICD-10-CM | POA: Diagnosis not present

## 2019-09-21 DIAGNOSIS — M5412 Radiculopathy, cervical region: Secondary | ICD-10-CM | POA: Diagnosis not present

## 2019-09-21 DIAGNOSIS — M2170 Unequal limb length (acquired), unspecified site: Secondary | ICD-10-CM | POA: Diagnosis not present

## 2019-09-21 DIAGNOSIS — M5136 Other intervertebral disc degeneration, lumbar region: Secondary | ICD-10-CM | POA: Diagnosis not present

## 2019-09-26 DIAGNOSIS — I10 Essential (primary) hypertension: Secondary | ICD-10-CM | POA: Diagnosis not present

## 2019-09-26 DIAGNOSIS — Z23 Encounter for immunization: Secondary | ICD-10-CM | POA: Diagnosis not present

## 2019-09-26 DIAGNOSIS — D3610 Benign neoplasm of peripheral nerves and autonomic nervous system, unspecified: Secondary | ICD-10-CM | POA: Diagnosis not present

## 2019-09-26 DIAGNOSIS — M5412 Radiculopathy, cervical region: Secondary | ICD-10-CM | POA: Diagnosis not present

## 2019-09-26 DIAGNOSIS — M8580 Other specified disorders of bone density and structure, unspecified site: Secondary | ICD-10-CM | POA: Diagnosis not present

## 2019-09-26 DIAGNOSIS — I358 Other nonrheumatic aortic valve disorders: Secondary | ICD-10-CM | POA: Diagnosis not present

## 2019-09-27 DIAGNOSIS — M1712 Unilateral primary osteoarthritis, left knee: Secondary | ICD-10-CM | POA: Diagnosis not present

## 2019-09-29 DIAGNOSIS — L570 Actinic keratosis: Secondary | ICD-10-CM | POA: Diagnosis not present

## 2019-09-29 DIAGNOSIS — D225 Melanocytic nevi of trunk: Secondary | ICD-10-CM | POA: Diagnosis not present

## 2019-09-29 DIAGNOSIS — L720 Epidermal cyst: Secondary | ICD-10-CM | POA: Diagnosis not present

## 2019-09-29 DIAGNOSIS — D2271 Melanocytic nevi of right lower limb, including hip: Secondary | ICD-10-CM | POA: Diagnosis not present

## 2019-09-29 DIAGNOSIS — D1801 Hemangioma of skin and subcutaneous tissue: Secondary | ICD-10-CM | POA: Diagnosis not present

## 2019-09-29 DIAGNOSIS — D2261 Melanocytic nevi of right upper limb, including shoulder: Secondary | ICD-10-CM | POA: Diagnosis not present

## 2019-09-29 DIAGNOSIS — Z85828 Personal history of other malignant neoplasm of skin: Secondary | ICD-10-CM | POA: Diagnosis not present

## 2019-09-29 DIAGNOSIS — L814 Other melanin hyperpigmentation: Secondary | ICD-10-CM | POA: Diagnosis not present

## 2019-09-29 DIAGNOSIS — D692 Other nonthrombocytopenic purpura: Secondary | ICD-10-CM | POA: Diagnosis not present

## 2019-09-29 DIAGNOSIS — D2262 Melanocytic nevi of left upper limb, including shoulder: Secondary | ICD-10-CM | POA: Diagnosis not present

## 2019-09-29 DIAGNOSIS — D2272 Melanocytic nevi of left lower limb, including hip: Secondary | ICD-10-CM | POA: Diagnosis not present

## 2019-09-29 DIAGNOSIS — L821 Other seborrheic keratosis: Secondary | ICD-10-CM | POA: Diagnosis not present

## 2019-10-03 DIAGNOSIS — Z01419 Encounter for gynecological examination (general) (routine) without abnormal findings: Secondary | ICD-10-CM | POA: Diagnosis not present

## 2019-10-05 ENCOUNTER — Other Ambulatory Visit (HOSPITAL_COMMUNITY): Payer: Self-pay | Admitting: Cardiovascular Disease

## 2019-10-05 DIAGNOSIS — I6523 Occlusion and stenosis of bilateral carotid arteries: Secondary | ICD-10-CM

## 2019-10-05 DIAGNOSIS — M545 Low back pain: Secondary | ICD-10-CM | POA: Diagnosis not present

## 2019-11-01 DIAGNOSIS — I10 Essential (primary) hypertension: Secondary | ICD-10-CM | POA: Diagnosis not present

## 2019-11-01 DIAGNOSIS — E559 Vitamin D deficiency, unspecified: Secondary | ICD-10-CM | POA: Diagnosis not present

## 2019-11-01 DIAGNOSIS — R079 Chest pain, unspecified: Secondary | ICD-10-CM | POA: Diagnosis not present

## 2019-11-01 DIAGNOSIS — E890 Postprocedural hypothyroidism: Secondary | ICD-10-CM | POA: Diagnosis not present

## 2019-11-01 DIAGNOSIS — D497 Neoplasm of unspecified behavior of endocrine glands and other parts of nervous system: Secondary | ICD-10-CM | POA: Diagnosis not present

## 2019-11-01 DIAGNOSIS — I6529 Occlusion and stenosis of unspecified carotid artery: Secondary | ICD-10-CM | POA: Diagnosis not present

## 2019-11-01 DIAGNOSIS — I350 Nonrheumatic aortic (valve) stenosis: Secondary | ICD-10-CM | POA: Diagnosis not present

## 2019-11-01 DIAGNOSIS — E785 Hyperlipidemia, unspecified: Secondary | ICD-10-CM | POA: Diagnosis not present

## 2019-11-01 DIAGNOSIS — G500 Trigeminal neuralgia: Secondary | ICD-10-CM | POA: Diagnosis not present

## 2019-11-02 ENCOUNTER — Ambulatory Visit
Admission: RE | Admit: 2019-11-02 | Discharge: 2019-11-02 | Disposition: A | Discharge status: Home / Self Care | Payer: PPO | Source: Ambulatory Visit | Attending: Obstetrics and Gynecology | Admitting: Obstetrics and Gynecology

## 2019-11-02 ENCOUNTER — Other Ambulatory Visit: Payer: Self-pay

## 2019-11-02 DIAGNOSIS — Z1231 Encounter for screening mammogram for malignant neoplasm of breast: Secondary | ICD-10-CM | POA: Diagnosis not present

## 2019-11-14 ENCOUNTER — Ambulatory Visit: Payer: PPO | Attending: Internal Medicine

## 2019-11-14 DIAGNOSIS — Z20828 Contact with and (suspected) exposure to other viral communicable diseases: Secondary | ICD-10-CM | POA: Diagnosis not present

## 2019-11-14 DIAGNOSIS — Z20822 Contact with and (suspected) exposure to covid-19: Secondary | ICD-10-CM

## 2019-11-16 ENCOUNTER — Telehealth: Payer: Self-pay

## 2019-11-16 LAB — NOVEL CORONAVIRUS, NAA: SARS-CoV-2, NAA: NOT DETECTED

## 2019-11-16 NOTE — Telephone Encounter (Signed)
Caller given negative result and verbalized understanding  

## 2019-11-21 NOTE — Progress Notes (Signed)
Cardiology Office Note:   Date:  11/22/2019  NAME:  [name] [name]    MRN: [id number] DOB:  1941-08-25   PCP:  [name], MD  Cardiologist:  Evalina Field, MD   Referring MD: [name], MD   Chief Complaint  Patient presents with  . Chest Pain   History of Present Illness:   [name] [name] is a 79 y.o. 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  She reports for the past 8 months she has had on and off exertional chest pressure.  She reports with heavy exertion she can get central left-sided chest pressure.  When she stops activity the pain goes away.  It can take several minutes to go away.  She does not get it with all exertion but it does happen rather periodically.  She reports it can occur every 2 weeks or so.  She does still maintain high level of activity walking 1 to 2 miles 3 times per week.  She reports associated symptoms of shortness of breath with the pain.  She did have a nuclear medicine stress test in 2015 that was normal.  Her blood pressure was initially elevated on check by nursing staff however my repeat was 130/80.  She does have carotid artery stenosis bilaterally however no obstructive disease that needs intervention.  Review of her laboratory data shows an LDL of 60 mg.  She has been maintained on Lipitor 80 mg daily no issues with this.  Blood pressure is controlled on lisinopril 20 mg daily.  She does have a history of heart disease in her brother and sister.  She is a never smoker.  She is a retired Designer, jewellery.  Her EKG is without any ischemic changes no evidence of prior infarction.  She is recently obtained an echocardiogram that showed normal left ventricular function and aortic valve sclerosis.  Problem List 1. HTN 2. HLD (total cholesterol 140, HDL  54, LDL 60, triglycerides 133, 04/01/2019) 3. Hypothyroidism 4. Carotid artery disease   Past Medical History: Past Medical History:  Diagnosis Date  . HTN (hypertension)   . Hyperlipidemia   . Hypothyroidism   . Osteoporosis   . Thyroid carcinoma [facility])     Past Surgical History: Past Surgical History:  Procedure Laterality Date  . ABDOMINAL HYSTERECTOMY    . ANKLE SURGERY    . APPENDECTOMY    . CHOLECYSTECTOMY    . gamma knife    . THYROIDECTOMY    . TOTAL HIP ARTHROPLASTY      Current Medications: Current Meds  Medication Sig  . acetaminophen (TYLENOL) 500 MG tablet Take 1,000 mg by mouth every 6 (six) hours as needed.  Marland Kitchen aspirin EC 81 MG tablet Take 81 mg by mouth at bedtime.  Marland Kitchen atorvastatin (LIPITOR) 20 MG tablet Take 80 mg by mouth daily.   . cholecalciferol (VITAMIN D) 1000 UNITS tablet Take 2,000 Units by mouth daily.  Marland Kitchen levothyroxine (SYNTHROID, LEVOTHROID) 200 MCG tablet Take 200 mcg by mouth daily before breakfast. Whole tablet SUN-TUES-THURS-SAT 1/2 tab MON-WEDS-FRI  . lisinopril (PRINIVIL,ZESTRIL) 10 MG tablet Take 20 mg by mouth daily.   . naproxen (NAPROSYN) 500 MG tablet Take 500 mg by mouth 2 (two) times daily as needed.  . Prenatal Vit-Fe Fumarate-FA (PRENATAL MULTIVITAMIN) TABS tablet Take 1 tablet  by mouth daily at 12 noon.  . vitamin B-12 (CYANOCOBALAMIN) 1000 MCG tablet Take 2,500 mcg by mouth daily.     Allergies:    Bee venom, Penicillins, Sulfa antibiotics, Adhesive [tape], and Morphine and related   Social History: Social History   Socioeconomic History  . Marital status: Divorced    Spouse name: Not on file  . Number of children: Not on file  . Years of education: Not on file  . Highest education level: Not on file  Occupational History  . Occupation: retired Designer, jewellery  Tobacco Use  . Smoking status: Never Smoker  . Smokeless tobacco: Never Used  Substance and Sexual Activity  . Alcohol use: Not Currently  . Drug use: Never   . Sexual activity: Not on file  Other Topics Concern  . Not on file  Social History Narrative  . Not on file   Social Determinants of Health   Financial Resource Strain:   . Difficulty of Paying Living Expenses: Not on file  Food Insecurity:   . Worried About Charity fundraiser in the Last Year: Not on file  . Ran Out of Food in the Last Year: Not on file  Transportation Needs:   . Lack of Transportation (Medical): Not on file  . Lack of Transportation (Non-Medical): Not on file  Physical Activity:   . Days of Exercise per Week: Not on file  . Minutes of Exercise per Session: Not on file  Stress:   . Feeling of Stress : Not on file  Social Connections:   . Frequency of Communication with Friends and Family: Not on file  . Frequency of Social Gatherings with Friends and Family: Not on file  . Attends Religious Services: Not on file  . Active Member of Clubs or Organizations: Not on file  . Attends Archivist Meetings: Not on file  . Marital Status: Not on file     Family History: The patient's family history includes Breast cancer (age of onset: 93) in her mother; Cancer in her brother; Cirrhosis in her father; Diabetes in her mother; Heart attack in her sister; Heart disease in her brother and mother; Hypertension in her mother.  ROS:   All other ROS reviewed and negative. Pertinent positives noted in the HPI.     EKGs/Labs/Other Studies Reviewed:   The following studies were personally reviewed by me today:  EKG:  EKG is ordered today.  The ekg ordered today demonstrates normal sinus rhythm, heart rate 66, no acute ST-T changes, no evidence of prior infarction, normal EKG, and was personally reviewed by me.   TTE [id number] Normal LV systolic function with EF 55%. Mild concentric hypertrophy of the left ventricle. Left ventricle cavity is normal in size. Normal global wall motion. Normal diastolic filling pattern, normal LAP.  Left atrial cavity is mildly  dilated. Trileaflet aortic valve with mild aortic valve leaflet calcification and slightly eccentric closure. Mild aortic stenosis. Aortic valve mean gradient of 6 mmHg, Vmax of 1.7 m/s. Calculated aortic valve area by continuity equation is 2.0 cm. No regurgitation noted. Mildly restricted mitral valve leaflets without significant stenosis. Mild (Grade I) mitral regurgitation. Moderate tricuspid regurgitation. Estimated pulmonary artery systolic pressure is 28 mmHg. Mild pulmonic regurgitation.  Carotid US Right Carotid: Velocities in the right ICA are consistent with a 40-59%                stenosis. Non-hemodynamically significant plaque <50% noted in  the CCA. The ECA appears <50% stenosed.  Left Carotid: Velocities in the left ICA are consistent with a 40-59% stenosis.               Non-hemodynamically significant plaque noted in the CCA. The ECA               appears <50% stenosed.  Vertebrals:  Right vertebral artery demonstrates antegrade flow. Left vertebral              artery demonstrates an occlusion. Left is antegrade proximally and              occludes more distal. Subclavians: Normal flow hemodynamics were seen in bilateral subclavian              arteries.  Recent Labs: No results found for requested labs within last 8760 hours.   Recent Lipid Panel No results found for: CHOL, TRIG, HDL, CHOLHDL, VLDL, LDLCALC, LDLDIRECT  Physical Exam:   VS:  BP (!) 199/83   Pulse 66   Ht 5\' 3"  (1.6 m)   Wt 142 lb 12.8 oz (64.8 kg)   SpO2 97%   BMI 25.30 kg/m    Wt Readings from Last 3 Encounters:  11/22/19 142 lb 12.8 oz (64.8 kg)  06/28/14 158 lb (71.7 kg)  06/15/14 160 lb (72.6 kg)    General: Well nourished, well developed, in no acute distress Heart: Atraumatic, normal size  Eyes: PEERLA, EOMI  Neck: Supple, no JVD Endocrine: No thryomegaly Cardiac: 2 out of 6 systolic ejection murmur Lungs: Clear to auscultation bilaterally, no wheezing, rhonchi  or rales  Abd: Soft, nontender, no hepatomegaly  Ext: No edema, pulses 2+ Musculoskeletal: No deformities, BUE and BLE strength normal and equal Skin: Warm and dry, no rashes   Neuro: Alert and oriented to person, place, time, and situation, CNII-XII grossly intact, no focal deficits  Psych: Normal mood and affect   ASSESSMENT:   [name] D Cauthon is a 79 y.o. female who presents for the following: 1. Coronary artery disease of native artery of native heart with stable angina pectoris ([place])   2. Other chest pain   3. Bilateral carotid artery stenosis   4. Essential hypertension   5. Pure hypercholesterolemia   6. Chest pain, unspecified type     PLAN:   1. Coronary artery disease of native artery of native heart with stable angina pectoris ([place]) 2. Other chest pain -Her symptoms are concerning for stable angina.  I discussed with her that I would like to start her on metoprolol as well as give her sublingual nitros as needed.  She is amendable to the sublingual nitros.  We will also proceed with a Lexiscan nuclear medicine stress test.  She reports she would like to wait to start any other medications until this test is obtained.  I instructed her that this is reasonable.  She was given strict return precautions as well as instructions to present to the emergency room if she has chest pain that lasts more than 15 minutes or 3 doses of nitroglycerin, 3 to 5 minutes apart. -Most recent echocardiogram with normal left ventricular function -No acute ischemic changes on EKG -We will see her back in 2 weeks for a virtual appointment -Continue aspirin statin  3. Bilateral carotid artery stenosis -Followed on vascular ultrasound -Continue aspirin statin  4. Essential hypertension -Continue lisinopril  5. Pure hypercholesterolemia -LDL at goal  6. Chest pain, unspecified type -Stress test as above   Disposition:  Return in about 2 weeks (around 12/06/2019).  Medication  Adjustments/Labs and Tests Ordered: Current medicines are reviewed at length with the patient today.  Concerns regarding medicines are outlined above.  Orders Placed This Encounter  Procedures  . Myocardial Perfusion Imaging   Meds ordered this encounter  Medications  . nitroGLYCERIN (NITROSTAT) 0.4 MG SL tablet    Sig: Place 1 tablet (0.4 mg total) under the tongue every 5 (five) minutes as needed for chest pain.    Dispense:  30 tablet    Refill:  5    Patient Instructions  Medication Instructions:  Your physician has recommended you make the following change in your medication:   START NITROGLYCERIN 0.4 MG. ONE TABLET UNDER THE TONGUE EVERY FIVE MINUTES FOR CHEST PAIN. MAY TAKE UP TO THREE TABLETS WITHIN A 15-MINUTE PERIOD  *If you need a refill on your cardiac medications before your next appointment, please call your pharmacy*  Lab Work: NONE If you have labs (blood work) drawn today and your tests are completely normal, you will receive your results only by: Marland Kitchen MyChart Message (if you have MyChart) OR . A paper copy in the mail If you have any lab test that is abnormal or we need to change your treatment, we will call you to review the results.  Testing/Procedures: Your physician has requested that you have a lexiscan myoview. For further information please visit [email or web address]. Please follow instruction sheet, as given.    Follow-Up: At [facility], you and your health needs are our priority.  As part of our continuing mission to provide you with exceptional heart care, we have created designated Provider Care Teams.  These Care Teams include your primary Cardiologist (physician) and Advanced Practice Providers (APPs -  Physician Assistants and Nurse Practitioners) who all work together to provide you with the care you need, when you need it.  Your next appointment:   2 week(s) (AFTER Leane Call)  The format for your next appointment:   Virtual Visit    Provider:   Eleonore Chiquito, MD  Other Instructions  Cardiac Nuclear Scan A cardiac nuclear scan is a test that is done to check the flow of blood to your heart. It is done when you are resting and when you are exercising. The test looks for problems such as:  Not enough blood reaching a portion of the heart.  The heart muscle not working as it should. You may need this test if:  You have heart disease.  You have had lab results that are not normal.  You have had heart surgery or a balloon procedure to open up blocked arteries (angioplasty).  You have chest pain.  You have shortness of breath. In this test, a special dye (tracer) is put into your bloodstream. The tracer will travel to your heart. A camera will then take pictures of your heart to see how the tracer moves through your heart. This test is usually done at a hospital and takes 2-4 hours. Tell a doctor about:  Any allergies you have.  All medicines you are taking, including vitamins, herbs, eye drops, creams, and over-the-counter medicines.  Any problems you or family members have had with anesthetic medicines.  Any blood disorders you have.  Any surgeries you have had.  Any medical conditions you have.  Whether you are pregnant or may be pregnant. What are the risks? Generally, this is a safe test. However, problems may occur, such as:  Serious chest pain and heart attack. This  is only a risk if the stress portion of the test is done.  Rapid heartbeat.  A feeling of warmth in your chest. This feeling usually does not last long.  Allergic reaction to the tracer. What happens before the test?  Ask your doctor about changing or stopping your normal medicines. This is important.  Follow instructions from your doctor about what you cannot eat or drink.  Remove your jewelry on the day of the test. What happens during the test?  An IV tube will be inserted into one of your veins.  Your doctor will  give you a small amount of tracer through the IV tube.  You will wait for 20-40 minutes while the tracer moves through your bloodstream.  Your heart will be monitored with an electrocardiogram (ECG).  You will lie down on an exam table.  Pictures of your heart will be taken for about 15-20 minutes.  You may also have a stress test. For this test, one of these things may be done: ? You will be asked to exercise on a treadmill or a stationary bike. ? You will be given medicines that will make your heart work harder. This is done if you are unable to exercise.  When blood flow to your heart has peaked, a tracer will again be given through the IV tube.  After 20-40 minutes, you will get back on the exam table. More pictures will be taken of your heart.  Depending on the tracer that is used, more pictures may need to be taken 3-4 hours later.  Your IV tube will be removed when the test is over. The test may vary among doctors and hospitals. What happens after the test?  Ask your doctor: ? Whether you can return to your normal schedule, including diet, activities, and medicines. ? Whether you should drink more fluids. This will help to remove the tracer from your body. Drink enough fluid to keep your pee (urine) pale yellow.  Ask your doctor, or the department that is doing the test: ? When will my results be ready? ? How will I get my results? Summary  A cardiac nuclear scan is a test that is done to check the flow of blood to your heart.  Tell your doctor whether you are pregnant or may be pregnant.  Before the test, ask your doctor about changing or stopping your normal medicines. This is important.  Ask your doctor whether you can return to your normal activities. You may be asked to drink more fluids. This information is not intended to replace advice given to you by your health care provider. Make sure you discuss any questions you have with your health care  provider. Document Revised: 02/23/2019 Document Reviewed: 04/19/2018 Elsevier Patient Education  2020 [place], [place]. Audie Box, [place]  [address] [place], [place] [address] [phone]  11/22/2019 12:56 PM

## 2019-11-22 ENCOUNTER — Telehealth (HOSPITAL_COMMUNITY): Payer: Self-pay

## 2019-11-22 ENCOUNTER — Other Ambulatory Visit: Payer: Self-pay

## 2019-11-22 ENCOUNTER — Encounter: Payer: Self-pay | Admitting: Cardiovascular Disease

## 2019-11-22 ENCOUNTER — Ambulatory Visit (INDEPENDENT_AMBULATORY_CARE_PROVIDER_SITE_OTHER): Payer: PPO | Admitting: Cardiovascular Disease

## 2019-11-22 VITALS — BP 130/80 | HR 66 | Ht 63.0 in | Wt 142.8 lb

## 2019-11-22 DIAGNOSIS — E7800 Pure hypercholesterolemia, unspecified: Secondary | ICD-10-CM

## 2019-11-22 DIAGNOSIS — I10 Essential (primary) hypertension: Secondary | ICD-10-CM

## 2019-11-22 DIAGNOSIS — R079 Chest pain, unspecified: Secondary | ICD-10-CM

## 2019-11-22 DIAGNOSIS — I25118 Atherosclerotic heart disease of native coronary artery with other forms of angina pectoris: Secondary | ICD-10-CM

## 2019-11-22 DIAGNOSIS — R0789 Other chest pain: Secondary | ICD-10-CM | POA: Diagnosis not present

## 2019-11-22 DIAGNOSIS — I6523 Occlusion and stenosis of bilateral carotid arteries: Secondary | ICD-10-CM

## 2019-11-22 MED ORDER — NITROGLYCERIN 0.4 MG SL SUBL: 0.4000 mg | SUBLINGUAL_TABLET | SUBLINGUAL | 5 refills | Status: DC | PRN

## 2019-11-22 NOTE — Patient Instructions (Addendum)
Medication Instructions:  Your physician has recommended you make the following change in your medication:   START NITROGLYCERIN 0.4 MG. ONE TABLET UNDER THE TONGUE EVERY FIVE MINUTES FOR CHEST PAIN. MAY TAKE UP TO THREE TABLETS WITHIN A 15-MINUTE PERIOD  *If you need a refill on your cardiac medications before your next appointment, please call your pharmacy*  Lab Work: NONE If you have labs (blood work) drawn today and your tests are completely normal, you will receive your results only by: Marland Kitchen MyChart Message (if you have MyChart) OR . A paper copy in the mail If you have any lab test that is abnormal or we need to change your treatment, we will call you to review the results.  Testing/Procedures: Your physician has requested that you have a lexiscan myoview. For further information please visit [email or web address]. Please follow instruction sheet, as given.    Follow-Up: At [facility], you and your health needs are our priority.  As part of our continuing mission to provide you with exceptional heart care, we have created designated Provider Care Teams.  These Care Teams include your primary Cardiologist (physician) and Advanced Practice Providers (APPs -  Physician Assistants and Nurse Practitioners) who all work together to provide you with the care you need, when you need it.  Your next appointment:   2 week(s) (AFTER Leane Call)  The format for your next appointment:   Virtual Visit   Provider:   Eleonore Chiquito, MD  Other Instructions  Cardiac Nuclear Scan A cardiac nuclear scan is a test that is done to check the flow of blood to your heart. It is done when you are resting and when you are exercising. The test looks for problems such as:  Not enough blood reaching a portion of the heart.  The heart muscle not working as it should. You may need this test if:  You have heart disease.  You have had lab results that are not normal.  You have had heart surgery  or a balloon procedure to open up blocked arteries (angioplasty).  You have chest pain.  You have shortness of breath. In this test, a special dye (tracer) is put into your bloodstream. The tracer will travel to your heart. A camera will then take pictures of your heart to see how the tracer moves through your heart. This test is usually done at a hospital and takes 2-4 hours. Tell a doctor about:  Any allergies you have.  All medicines you are taking, including vitamins, herbs, eye drops, creams, and over-the-counter medicines.  Any problems you or family members have had with anesthetic medicines.  Any blood disorders you have.  Any surgeries you have had.  Any medical conditions you have.  Whether you are pregnant or may be pregnant. What are the risks? Generally, this is a safe test. However, problems may occur, such as:  Serious chest pain and heart attack. This is only a risk if the stress portion of the test is done.  Rapid heartbeat.  A feeling of warmth in your chest. This feeling usually does not last long.  Allergic reaction to the tracer. What happens before the test?  Ask your doctor about changing or stopping your normal medicines. This is important.  Follow instructions from your doctor about what you cannot eat or drink.  Remove your jewelry on the day of the test. What happens during the test?  An IV tube will be inserted into one of your veins.  Your doctor will  give you a small amount of tracer through the IV tube.  You will wait for 20-40 minutes while the tracer moves through your bloodstream.  Your heart will be monitored with an electrocardiogram (ECG).  You will lie down on an exam table.  Pictures of your heart will be taken for about 15-20 minutes.  You may also have a stress test. For this test, one of these things may be done: ? You will be asked to exercise on a treadmill or a stationary bike. ? You will be given medicines that will  make your heart work harder. This is done if you are unable to exercise.  When blood flow to your heart has peaked, a tracer will again be given through the IV tube.  After 20-40 minutes, you will get back on the exam table. More pictures will be taken of your heart.  Depending on the tracer that is used, more pictures may need to be taken 3-4 hours later.  Your IV tube will be removed when the test is over. The test may vary among doctors and hospitals. What happens after the test?  Ask your doctor: ? Whether you can return to your normal schedule, including diet, activities, and medicines. ? Whether you should drink more fluids. This will help to remove the tracer from your body. Drink enough fluid to keep your pee (urine) pale yellow.  Ask your doctor, or the department that is doing the test: ? When will my results be ready? ? How will I get my results? Summary  A cardiac nuclear scan is a test that is done to check the flow of blood to your heart.  Tell your doctor whether you are pregnant or may be pregnant.  Before the test, ask your doctor about changing or stopping your normal medicines. This is important.  Ask your doctor whether you can return to your normal activities. You may be asked to drink more fluids. This information is not intended to replace advice given to you by your health care provider. Make sure you discuss any questions you have with your health care provider. Document Revised: 02/23/2019 Document Reviewed: 04/19/2018 Elsevier Patient Education  [place].

## 2019-11-22 NOTE — Telephone Encounter (Signed)
Encounter complete. 

## 2019-11-23 ENCOUNTER — Telehealth (HOSPITAL_COMMUNITY): Payer: Self-pay

## 2019-11-23 NOTE — Telephone Encounter (Signed)
Encounter complete. 

## 2019-11-24 ENCOUNTER — Ambulatory Visit (HOSPITAL_COMMUNITY)
Admission: RE | Admit: 2019-11-24 | Discharge: 2019-11-24 | Disposition: A | Discharge status: Home / Self Care | Payer: PPO | Source: Ambulatory Visit | Attending: Cardiovascular Disease | Admitting: Cardiovascular Disease

## 2019-11-24 ENCOUNTER — Other Ambulatory Visit: Payer: Self-pay

## 2019-11-24 DIAGNOSIS — R079 Chest pain, unspecified: Secondary | ICD-10-CM | POA: Diagnosis not present

## 2019-11-24 DIAGNOSIS — R0789 Other chest pain: Secondary | ICD-10-CM | POA: Diagnosis not present

## 2019-11-24 DIAGNOSIS — I25118 Atherosclerotic heart disease of native coronary artery with other forms of angina pectoris: Secondary | ICD-10-CM | POA: Diagnosis not present

## 2019-11-24 LAB — MYOCARDIAL PERFUSION IMAGING
LV dias vol: 76 mL (ref 46–106)
LV sys vol: 22 mL
Peak HR: 97 {beats}/min
Rest HR: 76 {beats}/min
SDS: 3
SRS: 1
SSS: 4
TID: 1.21

## 2019-11-24 MED ORDER — REGADENOSON 0.4 MG/5ML IV SOLN
0.4000 mg | Freq: Once | INTRAVENOUS | Status: AC
  Administered 2019-11-24: 0.4 mg via INTRAVENOUS

## 2019-11-24 MED ORDER — TECHNETIUM TC 99M TETROFOSMIN IV KIT
28.6000 | PACK | Freq: Once | INTRAVENOUS | Status: AC | PRN
  Administered 2019-11-24: 28.6 via INTRAVENOUS
  Filled 2019-11-24: qty 29

## 2019-11-24 MED ORDER — TECHNETIUM TC 99M TETROFOSMIN IV KIT
10.9000 | PACK | Freq: Once | INTRAVENOUS | Status: AC | PRN
  Administered 2019-11-24: 10.9 via INTRAVENOUS
  Filled 2019-11-24: qty 11

## 2019-11-24 MED ORDER — AMINOPHYLLINE 25 MG/ML IV SOLN
28.6000 mg | Freq: Once | INTRAVENOUS | Status: AC
  Administered 2019-11-24: 28.6 mg via INTRAVENOUS

## 2019-11-25 ENCOUNTER — Other Ambulatory Visit (INDEPENDENT_AMBULATORY_CARE_PROVIDER_SITE_OTHER): Payer: PPO

## 2019-11-25 ENCOUNTER — Telehealth: Payer: Self-pay | Admitting: Cardiovascular Disease

## 2019-11-25 DIAGNOSIS — I6523 Occlusion and stenosis of bilateral carotid arteries: Secondary | ICD-10-CM

## 2019-11-25 DIAGNOSIS — I6529 Occlusion and stenosis of unspecified carotid artery: Secondary | ICD-10-CM | POA: Diagnosis not present

## 2019-11-25 DIAGNOSIS — I25118 Atherosclerotic heart disease of native coronary artery with other forms of angina pectoris: Secondary | ICD-10-CM | POA: Diagnosis not present

## 2019-11-25 DIAGNOSIS — I10 Essential (primary) hypertension: Secondary | ICD-10-CM | POA: Diagnosis not present

## 2019-11-25 DIAGNOSIS — R079 Chest pain, unspecified: Secondary | ICD-10-CM | POA: Diagnosis not present

## 2019-11-25 DIAGNOSIS — R0789 Other chest pain: Secondary | ICD-10-CM

## 2019-11-25 DIAGNOSIS — R072 Precordial pain: Secondary | ICD-10-CM | POA: Diagnosis not present

## 2019-11-25 DIAGNOSIS — E7800 Pure hypercholesterolemia, unspecified: Secondary | ICD-10-CM

## 2019-11-25 MED ORDER — METOPROLOL TARTRATE 25 MG PO TABS: 12.5000 mg | ORAL_TABLET | Freq: Two times a day (BID) | ORAL | 3 refills | Status: DC

## 2019-11-25 NOTE — Telephone Encounter (Signed)
Called Mrs. Bullis. Normal stress test. Still having central exertional chest pressure that responds well to nitro. May be false negative stress. Will start metoprolol tartrate 12.5 mg BID. We will see her back in 2 weeks. I discussed cardiac CT with her to clarify what is going on. She is interested but will read about it first. Will give metoprolol a chance too.   Given strict return precautions. Also, it takes more than 3 NTG 3-5 minutes per dose (15 minutes today) and no resolution of chest pain, should present to ER. She was in agreement.   [place] T. Audie Box, [place]  [address], [place], [place] [address] [phone]  4:29 PM

## 2019-11-25 NOTE — Telephone Encounter (Signed)
Attempted to call [name] [name] about her stress test. Normal test. She had classic angina symptoms but no ischemia on stress. Would like to discuss her symptoms a bit more.   [place] T. Audie Box, [place]  [address] [place], [place] [address] [phone]  3:49 PM

## 2019-11-25 NOTE — Telephone Encounter (Signed)
Called. -W

## 2019-11-25 NOTE — Telephone Encounter (Signed)
Routed to MD

## 2019-11-25 NOTE — Telephone Encounter (Signed)
New message:    Patient states Dr. Farris Has called her and patient would like for him to call back.

## 2019-12-07 NOTE — Progress Notes (Signed)
Virtual Visit via Telephone Note   This visit type was conducted due to national recommendations for restrictions regarding the COVID-19 Pandemic (e.g. social distancing) in an effort to limit this patient's exposure and mitigate transmission in our community.  Due to her co-morbid illnesses, this patient is at least at moderate risk for complications without adequate follow up.  This format is felt to be most appropriate for this patient at this time.  The patient did not have access to video technology/had technical difficulties with video requiring transitioning to audio format only (telephone).  All issues noted in this document were discussed and addressed.  No physical exam could be performed with this format.  Please refer to the patient's chart for her  consent to telehealth for [facility].   Date:  12/08/2019   ID:  BRANDLYN PAOLO, DOB 14-Feb-1941, MRN [id number]  Patient Location: Home Provider Location: Home  PCP:  [name], MD  Cardiologist:  Evalina Field, MD   Evaluation Performed:  Follow-Up Visit  Chief Complaint:  Chest pain  History of Present Illness:    [name] [name] is a 79 y.o. female with below history who presents for follow-up. Seen 2 weeks ago with concerns for typical angina. NM stress negative. Echo showed EF 55-60%. Reports still getting chest pressure. She gets central chest pressure that can occur after lunch. The pain lasts minutes and resolves without intervention. She can get the pain with walking. She reports it resolves after cessation of activity. Pain resolves within 3-5 minutes. NTG does help her pain when she takes it. She is able to walk up to 30 minutes without chest pain/pressure. No associated SOB, palpitations reported. She reports 2 episodes of chest pain on 1/9, 1 episode on 1/10, and 1 more on 1/20. Having less symptoms on current dose of metoprolol 25 mg BID. She reports she is interested proceeding with cardiac CTA.    Problem List 1. HTN 2. HLD (total cholesterol 140, HDL 54, LDL 60, triglycerides 133, 04/01/2019) 3. Hypothyroidism 4. Carotid artery disease  -R ICA 40-59% -L ICA 40-59% 5. Trigeminal neuralgia s/p gamma knife  6. 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   Past Medical History:  Diagnosis Date  . HTN (hypertension)   . Hyperlipidemia   . Hypothyroidism   . Osteoporosis   . 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 ABDOMINAL HYSTERECTOMY    . ANKLE SURGERY    . APPENDECTOMY    . CHOLECYSTECTOMY    . gamma knife    . THYROIDECTOMY    . TOTAL HIP ARTHROPLASTY       Current Meds  Medication Sig  . acetaminophen (TYLENOL) 500 MG tablet Take 1,000 mg by mouth every 6 (six) hours as needed.  Marland Kitchen aspirin EC 81 MG tablet Take 81 mg by mouth at bedtime.  Marland Kitchen atorvastatin (LIPITOR) 20 MG tablet Take 80 mg by mouth daily.   . cholecalciferol (VITAMIN D) 1000 UNITS tablet Take 1,000 Units by mouth daily.   Marland Kitchen levothyroxine (SYNTHROID, LEVOTHROID) 200 MCG tablet Take 200 mcg by mouth daily before breakfast. Whole tablet SUN-TUES-THURS-SAT 1/2 tab MON-WEDS-FRI  . lisinopril (PRINIVIL,ZESTRIL) 10 MG tablet Take 20 mg by mouth daily.   . naproxen (NAPROSYN) 500 MG tablet Take 500 mg by mouth 2 (two) times daily as needed.  . nitroGLYCERIN (  NITROSTAT) 0.4 MG SL tablet Place 1 tablet (0.4 mg total) under the tongue every 5 (five) minutes as needed for chest pain.  . Prenatal Vit-Fe Fumarate-FA (PRENATAL MULTIVITAMIN) TABS tablet Take 1 tablet by mouth daily at 12 noon.  . vitamin B-12 (CYANOCOBALAMIN) 1000 MCG tablet Take 2,500 mcg by mouth daily.    Allergies:   Bee venom, Penicillins, Sulfa antibiotics, Adhesive [tape], and Morphine and related   Social History   Tobacco Use  . Smoking status: Never Smoker  . Smokeless  tobacco: Never Used  Substance Use Topics  . Alcohol use: Not Currently  . Drug use: Never    Family Hx: The patient's family history includes Breast cancer (age of onset: 61) in her mother; Cancer in her brother; Cirrhosis in her father; Diabetes in her mother; Heart attack in her sister; Heart disease in her brother and mother; Hypertension in her mother.  ROS:   Please see the history of present illness.     All other systems reviewed and are negative.   Prior CV studies:   The following studies were reviewed today:  TTE 09/14/2019 Echocardiogram 09/14/2019: Normal LV systolic function with EF 55%. Mild concentric hypertrophy of the left ventricle. Left ventricle cavity is normal in size. Normal global wall motion. Normal diastolic filling pattern, normal LAP.  Left atrial cavity is mildly dilated. Trileaflet aortic valve with mild aortic valve leaflet calcification and slightly eccentric closure. Mild aortic stenosis. Aortic valve mean gradient of 6 mmHg, Vmax of 1.7 m/s. Calculated aortic valve area by continuity equation is 2.0 cm. No regurgitation noted. Mildly restricted mitral valve leaflets without significant stenosis. Mild (Grade I) mitral regurgitation. Moderate tricuspid regurgitation. Estimated pulmonary artery systolic pressure is 28 mmHg. Mild pulmonic regurgitation.  NM Stress 11/24/2019  The left ventricular ejection fraction is hyperdynamic (>65%).  Nuclear stress EF: 71%. No wall motion abnormalities  There was no ST segment deviation noted during stress.  This is a low risk study. No ischemia identified.  Labs/Other Tests and Data Reviewed:    EKG:  No ECG reviewed.  Recent Labs: No results found for requested labs within last 8760 hours.   Recent Lipid Panel No results found for: CHOL, TRIG, HDL, CHOLHDL, LDLCALC, LDLDIRECT  Wt Readings from Last 3 Encounters:  12/08/19 140 lb (63.5 kg)  11/24/19 148 lb (67.1 kg)  11/22/19 142 lb 12.8 oz (64.8  kg)     Objective:    Vital Signs:  Ht 5\' 3"  (1.6 m)   Wt 140 lb (63.5 kg)   BMI 24.80 kg/m    VITAL SIGNS:  reviewed  Gen: no acute distress  Pulm: no trouble breathing, talking in full sentences  CV: no palpitations  Ext: no LE edema   ASSESSMENT & PLAN:    1. Chest pain, unspecified type -continues to have intermittent symptoms of typical angina but does have atypical features. She has noticed improvement on metoprolol -I have concerns that her nm stress is possibly a false negative. I think we should proceed with cardiac CTA to clarify her symptoms and normal test. She will take 50 mg metoprolol tartrate 2 hours before scan. BMP 1 week before. We will follow-up by phone results of scan and decide on follow-up at that time.   2. Essential hypertension -continue current meds  3. Carotid stenosis, asymptomatic, bilateral -ASA/statin   4. Mixed hyperlipidemia -continue current meds    COVID-19 Education: The signs and symptoms of COVID-19 were discussed with the patient and how  to seek care for testing (follow up with PCP or arrange E-visit).  The importance of social distancing was discussed today.  Time:   Today, I have spent 25 minutes with the patient with telehealth technology discussing the above problems.     Medication Adjustments/Labs and Tests Ordered: Current medicines are reviewed at length with the patient today.  Concerns regarding medicines are outlined above.   Tests Ordered: Orders Placed This Encounter  Procedures  . CT CORONARY MORPH W/CTA COR W/SCORE W/CA W/CM &/OR WO/CM  . CT CORONARY FRACTIONAL FLOW RESERVE DATA PREP  . CT CORONARY FRACTIONAL FLOW RESERVE FLUID ANALYSIS  . Basic metabolic panel    Medication Changes: No orders of the defined types were placed in this encounter.   Follow Up:  Will be decided after CCTA   Signed, Evalina Field, MD  12/08/2019 9:54 AM    [place]

## 2019-12-08 ENCOUNTER — Encounter: Payer: Self-pay | Admitting: Cardiovascular Disease

## 2019-12-08 ENCOUNTER — Telehealth (INDEPENDENT_AMBULATORY_CARE_PROVIDER_SITE_OTHER): Payer: PPO | Admitting: Cardiovascular Disease

## 2019-12-08 VITALS — Ht 63.0 in | Wt 140.0 lb

## 2019-12-08 DIAGNOSIS — I6523 Occlusion and stenosis of bilateral carotid arteries: Secondary | ICD-10-CM

## 2019-12-08 DIAGNOSIS — R079 Chest pain, unspecified: Secondary | ICD-10-CM

## 2019-12-08 DIAGNOSIS — E782 Mixed hyperlipidemia: Secondary | ICD-10-CM

## 2019-12-08 DIAGNOSIS — I10 Essential (primary) hypertension: Secondary | ICD-10-CM

## 2019-12-08 NOTE — Patient Instructions (Signed)
Medication Instructions:  Take 50 mg of Metoprolol 2 hours before CT scan.  *If you need a refill on your cardiac medications before your next appointment, please call your pharmacy*  Lab Work: BMET one week before CT scan If you have labs (blood work) drawn today and your tests are completely normal, you will receive your results only by: Marland Kitchen MyChart Message (if you have MyChart) OR . A paper copy in the mail If you have any lab test that is abnormal or we need to change your treatment, we will call you to review the results.  Testing/Procedures: Your physician has requested that you have cardiac CT. Cardiac computed tomography (CT) is a painless test that uses an x-ray machine to take clear, detailed pictures of your heart. For further information please visit [email or web address]. Please follow instruction sheet as given.   Follow-Up: At [facility], you and your health needs are our priority.  As part of our continuing mission to provide you with exceptional heart care, we have created designated Provider Care Teams.  These Care Teams include your primary Cardiologist (physician) and Advanced Practice Providers (APPs -  Physician Assistants and Nurse Practitioners) who all work together to provide you with the care you need, when you need it.  Your next appointment:   PRN  The format for your next appointment:   Either In Person or Virtual  Provider:   Eleonore Chiquito, MD  Other Instructions  Your cardiac CT will be scheduled at one of the below locations:   [facility] [address] [place], [place] [address] [phone]  If scheduled at [facility], please arrive at the [facility] main entrance of [facility] 30-45 minutes prior to test start time. Proceed to the [facility] Radiology Department (first floor) to check-in and test prep.  Please follow these instructions carefully (unless otherwise directed):  Hold all erectile dysfunction  medications at least 3 days (72 hrs) prior to test.  On the Night Before the Test: . Be sure to Drink plenty of water. . Do not consume any caffeinated/decaffeinated beverages or chocolate 12 hours prior to your test. . Do not take any antihistamines 12 hours prior to your test.  On the Day of the Test: . Drink plenty of water. Do not drink any water within one hour of the test. . Do not eat any food 4 hours prior to the test. . You may take your regular medications prior to the test.  . Take metoprolol (Lopressor) two hours prior to test. . HOLD Furosemide/Hydrochlorothiazide morning of the test. . FEMALES- please wear underwire-free bra if available       After the Test: . Drink plenty of water. . After receiving IV contrast, you may experience a mild flushed feeling. This is normal. . On occasion, you may experience a mild rash up to 24 hours after the test. This is not dangerous. If this occurs, you can take Benadryl 25 mg and increase your fluid intake. . If you experience trouble breathing, this can be serious. If it is severe call 911 IMMEDIATELY. If it is mild, please call our office. . If you take any of these medications: Glipizide/Metformin, Avandament, Glucavance, please do not take 48 hours after completing test unless otherwise instructed.   Once we have confirmed authorization from your insurance company, we will call you to set up a date and time for your test.   For non-scheduling related questions, please contact the cardiac imaging nurse  navigator should you have any questions/concerns: Marchia Bond, RN Navigator Cardiac Imaging [facility] Heart and Vascular Services [phone] Office

## 2019-12-15 ENCOUNTER — Telehealth: Payer: Self-pay

## 2019-12-15 NOTE — Telephone Encounter (Signed)
Called patient, she states that she has had issues before with the contrast- and it broke her out for 5-7 days causing her to be itchy. She does have an allergy to tape but she is unsure if it came from that or the contrast.  Would you like to order the prednisone doses for the contrast protocol to be safe?   Thanks!

## 2019-12-15 NOTE — Telephone Encounter (Signed)
Patient returning [name] [name]'s call °

## 2019-12-15 NOTE — Telephone Encounter (Signed)
I guess we better order it just in case. Thanks!  -W

## 2019-12-15 NOTE — Telephone Encounter (Signed)
Called patient to discuss her possible interaction with the contrast at another exam.  Patient did not answer, LVM to call back.

## 2019-12-16 MED ORDER — PREDNISONE 50 MG PO TABS: ORAL_TABLET | ORAL | 0 refills | Status: DC

## 2019-12-16 NOTE — Addendum Note (Signed)
Addended by: Caprice Beaver T on: 12/16/2019 10:14 AM   Modules accepted: Orders

## 2019-12-19 DIAGNOSIS — H903 Sensorineural hearing loss, bilateral: Secondary | ICD-10-CM | POA: Diagnosis not present

## 2019-12-19 DIAGNOSIS — H9313 Tinnitus, bilateral: Secondary | ICD-10-CM | POA: Diagnosis not present

## 2019-12-19 DIAGNOSIS — H9042 Sensorineural hearing loss, unilateral, left ear, with unrestricted hearing on the contralateral side: Secondary | ICD-10-CM | POA: Diagnosis not present

## 2020-01-04 ENCOUNTER — Telehealth: Payer: Self-pay | Admitting: Cardiovascular Disease

## 2020-01-04 MED ORDER — METOPROLOL TARTRATE 25 MG PO TABS: 25.0000 mg | ORAL_TABLET | Freq: Two times a day (BID) | ORAL | 1 refills | Status: DC

## 2020-01-04 NOTE — Telephone Encounter (Signed)
New message   Patient needs a new prescription for metoprolol tartrate (LOPRESSOR) 25 MG tablet, patient is running out of her medication she needs to take 25mg  twice daily. Please send to [place], [place] - [address]

## 2020-01-04 NOTE — Telephone Encounter (Signed)
Rx(s) sent to pharmacy electronically.  

## 2020-01-05 DIAGNOSIS — G500 Trigeminal neuralgia: Secondary | ICD-10-CM | POA: Diagnosis not present

## 2020-01-05 DIAGNOSIS — I6529 Occlusion and stenosis of unspecified carotid artery: Secondary | ICD-10-CM | POA: Diagnosis not present

## 2020-01-05 DIAGNOSIS — I10 Essential (primary) hypertension: Secondary | ICD-10-CM | POA: Diagnosis not present

## 2020-01-05 DIAGNOSIS — D3610 Benign neoplasm of peripheral nerves and autonomic nervous system, unspecified: Secondary | ICD-10-CM | POA: Diagnosis not present

## 2020-01-05 DIAGNOSIS — R42 Dizziness and giddiness: Secondary | ICD-10-CM | POA: Diagnosis not present

## 2020-01-05 DIAGNOSIS — E890 Postprocedural hypothyroidism: Secondary | ICD-10-CM | POA: Diagnosis not present

## 2020-01-05 DIAGNOSIS — M810 Age-related osteoporosis without current pathological fracture: Secondary | ICD-10-CM | POA: Diagnosis not present

## 2020-01-05 DIAGNOSIS — D497 Neoplasm of unspecified behavior of endocrine glands and other parts of nervous system: Secondary | ICD-10-CM | POA: Diagnosis not present

## 2020-01-05 DIAGNOSIS — F329 Major depressive disorder, single episode, unspecified: Secondary | ICD-10-CM | POA: Diagnosis not present

## 2020-01-05 DIAGNOSIS — H9193 Unspecified hearing loss, bilateral: Secondary | ICD-10-CM | POA: Diagnosis not present

## 2020-01-05 DIAGNOSIS — E785 Hyperlipidemia, unspecified: Secondary | ICD-10-CM | POA: Diagnosis not present

## 2020-01-05 DIAGNOSIS — R079 Chest pain, unspecified: Secondary | ICD-10-CM | POA: Diagnosis not present

## 2020-01-05 DIAGNOSIS — Z1331 Encounter for screening for depression: Secondary | ICD-10-CM | POA: Diagnosis not present

## 2020-01-16 DIAGNOSIS — R079 Chest pain, unspecified: Secondary | ICD-10-CM | POA: Diagnosis not present

## 2020-01-17 ENCOUNTER — Encounter (HOSPITAL_COMMUNITY): Payer: Self-pay

## 2020-01-17 ENCOUNTER — Telehealth: Payer: Self-pay | Admitting: Cardiovascular Disease

## 2020-01-17 DIAGNOSIS — E875 Hyperkalemia: Secondary | ICD-10-CM | POA: Diagnosis not present

## 2020-01-17 LAB — BASIC METABOLIC PANEL
BUN/Creatinine Ratio: 21 (ref 12–28)
BUN: 23 mg/dL (ref 8–27)
CO2: 20 mmol/L (ref 20–29)
Calcium: 10.7 mg/dL — ABNORMAL HIGH (ref 8.7–10.3)
Chloride: 103 mmol/L (ref 96–106)
Creatinine, Ser: 1.11 mg/dL — ABNORMAL HIGH (ref 0.57–1.00)
GFR calc Af Amer: 56 mL/min/{1.73_m2} — ABNORMAL LOW (ref >59)
GFR calc non Af Amer: 48 mL/min/{1.73_m2} — ABNORMAL LOW (ref >59)
Glucose: 76 mg/dL (ref 65–99)
Potassium: 5.8 mmol/L (ref 3.5–5.2)
Sodium: 137 mmol/L (ref 134–144)

## 2020-01-17 NOTE — Telephone Encounter (Signed)
Called Mrs. Patella. K 5.8. No symptoms. Cr normal. She has been eating more oranges. No K supplement. She will come in for repeat lab today.   [place] T. Audie Box, [place]  [address], [place], [place] [address] [phone]  9:56 AM

## 2020-01-18 ENCOUNTER — Telehealth (HOSPITAL_COMMUNITY): Payer: Self-pay | Admitting: Emergency Medicine

## 2020-01-18 ENCOUNTER — Telehealth: Payer: Self-pay | Admitting: Cardiovascular Disease

## 2020-01-18 LAB — BASIC METABOLIC PANEL
BUN/Creatinine Ratio: 20 (ref 12–28)
BUN: 25 mg/dL (ref 8–27)
CO2: 23 mmol/L (ref 20–29)
Calcium: 10.5 mg/dL — ABNORMAL HIGH (ref 8.7–10.3)
Chloride: 105 mmol/L (ref 96–106)
Creatinine, Ser: 1.25 mg/dL — ABNORMAL HIGH (ref 0.57–1.00)
GFR calc Af Amer: 48 mL/min/{1.73_m2} — ABNORMAL LOW (ref >59)
GFR calc non Af Amer: 42 mL/min/{1.73_m2} — ABNORMAL LOW (ref >59)
Glucose: 87 mg/dL (ref 65–99)
Potassium: 5.5 mmol/L — ABNORMAL HIGH (ref 3.5–5.2)
Sodium: 141 mmol/L (ref 134–144)

## 2020-01-18 NOTE — Telephone Encounter (Signed)
Called Mrs. Igoe about labs. K better. Cr basically unchanged. Will plan for hydration with her CTA.   [place] T. Audie Box, [place]  [address] [place], [place] [address] [phone]  4:43 PM

## 2020-01-18 NOTE — Telephone Encounter (Signed)
Pt calling to discuss medications for upcoming CCTA appt. Reviewed times to take allergy pre-meds for (suspected) contrast allergy. (7:30p, 1:30a, 7:30a)  Pt verbalized understanding to take 50mg  PO metoprolol 2 hr prior to scan (6:30a)  Pt also mentioned having repeat labs yesterday which showed worsened renal function, meaning she will need to come in for additional IV hydration pre-/post-contrast infusion. Pt verbalized understanding   Arrival : 7:30a to IR nurses station to start fluids as there was no availability in med day.   Bryson Ha made aware of plan  Marchia Bond RN Navigator Cardiac Imaging [facility] Heart and Vascular Services [phone] Office  [phone] Cell

## 2020-01-18 NOTE — Telephone Encounter (Signed)
Lab is in- will have MD review.

## 2020-01-19 ENCOUNTER — Ambulatory Visit (HOSPITAL_COMMUNITY)
Admission: RE | Admit: 2020-01-19 | Discharge: 2020-01-19 | Disposition: A | Discharge status: Home / Self Care | Payer: PPO | Source: Ambulatory Visit | Attending: Cardiovascular Disease | Admitting: Cardiovascular Disease

## 2020-01-19 ENCOUNTER — Other Ambulatory Visit: Payer: Self-pay

## 2020-01-19 DIAGNOSIS — R079 Chest pain, unspecified: Secondary | ICD-10-CM | POA: Insufficient documentation

## 2020-01-19 DIAGNOSIS — I2510 Atherosclerotic heart disease of native coronary artery without angina pectoris: Secondary | ICD-10-CM

## 2020-01-19 MED ORDER — NITROGLYCERIN 0.4 MG SL SUBL
0.8000 mg | SUBLINGUAL_TABLET | Freq: Once | SUBLINGUAL | Status: AC
  Administered 2020-01-19: 0.8 mg via SUBLINGUAL

## 2020-01-19 MED ORDER — IOHEXOL 350 MG/ML SOLN
80.0000 mL | Freq: Once | INTRAVENOUS | Status: AC
  Administered 2020-01-19: 80 mL via INTRAVENOUS

## 2020-01-19 MED ORDER — SODIUM CHLORIDE 0.9 % WEIGHT BASED INFUSION
64.0000 mL/h | INTRAVENOUS | Status: DC
  Administered 2020-01-19 (×2): 64 mL/h via INTRAVENOUS

## 2020-01-19 MED ORDER — SODIUM CHLORIDE 0.9 % WEIGHT BASED INFUSION
192.0000 mL/h | INTRAVENOUS | Status: AC
  Administered 2020-01-19: 3 mL/kg/h via INTRAVENOUS

## 2020-01-19 MED ORDER — NITROGLYCERIN 0.4 MG SL SUBL
SUBLINGUAL_TABLET | SUBLINGUAL | Status: AC
  Filled 2020-01-19: qty 2

## 2020-01-19 NOTE — Progress Notes (Signed)
Infusion completed. Removed IV. Pt ambulated to main entrance without difficulty. VSS

## 2020-01-20 ENCOUNTER — Telehealth: Payer: Self-pay | Admitting: Cardiovascular Disease

## 2020-01-20 DIAGNOSIS — I2510 Atherosclerotic heart disease of native coronary artery without angina pectoris: Secondary | ICD-10-CM | POA: Diagnosis not present

## 2020-01-20 NOTE — Telephone Encounter (Signed)
Got patient in for an appointment with MD on Monday.  Patient verbalized understanding.

## 2020-01-20 NOTE — Telephone Encounter (Signed)
Called Mrs. Gras. 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 No symptoms. Will continue metoprolol and NTG as needed. Will get her to see me on Monday morning at 9 AM. Needs cardiac cath. Given strict precautions to go to the ER for CP that will not resolve with NTG.   [place] T. Audie Box, [place]  [name], [place], [place] [address] [phone]  10:50 AM

## 2020-01-20 NOTE — Telephone Encounter (Signed)
Patient was scheduled for double book spot, and notified.   Thanks!

## 2020-01-22 NOTE — Progress Notes (Addendum)
Cardiology Office Note:   Date:  01/23/2020  NAME:  [name] [name]    MRN: [id number] DOB:  [birth year]   PCP:  [name], MD  Cardiologist:  Evalina Field, MD   Referring MD: [name], MD   Chief Complaint  Patient presents with  . Coronary Artery Disease   History of Present Illness:   [name] [name] is a 80 y.o. female with a hx of CAD, HTN, HLD who presents for follow-up of CAD. Symptoms of typical angina. Normal SPECT but due to persistent symptoms we pursued CCTA which showed 3 vessel CAD. She presents today to discuss cath.  She reports she still is shocked that her cardiac CTA shows three-vessel CAD.  She reports that she has been doing well since our last visit.  She reports her last episode of exertional chest pain occurred 1 week ago.  She actually reports the pain went away with cessation of activity.  She did not take any nitroglycerin.  Her blood pressure is a bit elevated today.  I suspect a lot of this is anxiety.  I did discuss the option of coming off lisinopril given the recent potassium issues.  She is not willing to do this right now.  I think we should probably switch her over to amlodipine if she is willing.  We will recheck a BMP today.  She reports she is okay to proceed with cardiac catheterization to define her anatomy.  I did express the importance of this as a cardiac CTA is unable to definitively show what the anatomy is given the significant coronary calcium.  Problem List 1. CAD -normal SPECT, 3v CAD by CCTA  2. HTN 3. HLD (total cholesterol 140, HDL 54, LDL 60, triglycerides 133, 04/01/2019) 4. Hypothyroidism 5. Carotid artery disease -R ICA 40-59% -L ICA 40-59% 6. Trigeminal neuralgia s/p gamma knife  7. 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 HTN (hypertension)   . Hyperlipidemia   . Hypothyroidism   . Osteoporosis   . 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 ABDOMINAL HYSTERECTOMY    . ANKLE SURGERY    . APPENDECTOMY    . CHOLECYSTECTOMY    . gamma knife    . THYROIDECTOMY    . TOTAL HIP ARTHROPLASTY      Current Medications: Current Meds  Medication Sig  . acetaminophen (TYLENOL) 500 MG tablet Take 1,000 mg by mouth every 6 (six) hours as needed.  Marland Kitchen aspirin EC 81 MG tablet Take 81 mg by mouth at bedtime.  Marland Kitchen atorvastatin (LIPITOR) 20 MG tablet Take 80 mg by mouth daily.   . cholecalciferol (VITAMIN D) 1000 UNITS tablet Take 1,000 Units by mouth daily.   Marland Kitchen ibuprofen (ADVIL,MOTRIN) 600 MG tablet Take 600 mg by mouth every 6 (six) hours as needed.  Marland Kitchen levothyroxine (SYNTHROID, LEVOTHROID) 200 MCG tablet Take 200 mcg by mouth daily before breakfast. Whole tablet SUN-TUES-THURS-SAT 1/2 tab MON-WEDS-FRI  . lisinopril (PRINIVIL,ZESTRIL) 10 MG tablet Take 20 mg by mouth daily.   . metoprolol tartrate (LOPRESSOR) 25 MG tablet Take 1 tablet (25 mg total) by mouth 2 (two) times daily.  . naproxen (NAPROSYN) 500 MG tablet Take 500 mg by mouth 2 (two) times daily as needed.  . nitroGLYCERIN (NITROSTAT) 0.4 MG SL tablet Place 1 tablet (0.4 mg total) under the tongue every 5 (five) minutes as  needed for chest pain.  . predniSONE (DELTASONE) 50 MG tablet Take one tablet 13 hours prior to CT, take one tablet 7 hours prior to CT, take one tablet 1 hour prior to CT.  Marland Kitchen Prenatal Vit-Fe Fumarate-FA (PRENATAL MULTIVITAMIN) TABS tablet Take 1 tablet by mouth daily at 12 noon.  . vitamin B-12 (CYANOCOBALAMIN) 1000 MCG tablet Take 2,500 mcg by mouth daily.     Allergies:    Bee venom, Penicillins, Sulfa antibiotics, Adhesive [tape], and Morphine and related   Social History: Social History   Socioeconomic History  . Marital status: Divorced    Spouse name: Not on file  . Number of children: Not on file  . Years of education: Not on file  . Highest education level: Not on file    Occupational History  . Occupation: retired Designer, jewellery  Tobacco Use  . Smoking status: Never Smoker  . Smokeless tobacco: Never Used  Substance and Sexual Activity  . Alcohol use: Not Currently  . Drug use: Never  . Sexual activity: Not on file  Other Topics Concern  . Not on file  Social History Narrative  . Not on file   Social Determinants of Health   Financial Resource Strain:   . Difficulty of Paying Living Expenses: Not on file  Food Insecurity:   . Worried About Charity fundraiser in the Last Year: Not on file  . Ran Out of Food in the Last Year: Not on file  Transportation Needs:   . Lack of Transportation (Medical): Not on file  . Lack of Transportation (Non-Medical): Not on file  Physical Activity:   . Days of Exercise per Week: Not on file  . Minutes of Exercise per Session: Not on file  Stress:   . Feeling of Stress : Not on file  Social Connections:   . Frequency of Communication with Friends and Family: Not on file  . Frequency of Social Gatherings with Friends and Family: Not on file  . Attends Religious Services: Not on file  . Active Member of Clubs or Organizations: Not on file  . Attends Archivist Meetings: Not on file  . Marital Status: Not on file     Family History: The patient's family history includes Breast cancer (age of onset: 63) in her mother; Cancer in her brother; Cirrhosis in her father; Diabetes in her mother; Heart attack in her sister; Heart disease in her brother and mother; Hypertension in her mother.  ROS:   All other ROS reviewed and negative. Pertinent positives noted in the HPI.     EKGs/Labs/Other Studies Reviewed:   The following studies were personally reviewed by me today:  EKG:  EKG is ordered today.  The ekg ordered today demonstrates sinus bradycardia, heart rate 55, no acute EKG changes, no evidence of prior infarction, and was personally reviewed by me.   CCTA 01/19/2020 IMPRESSION: 1. Coronary  calcium score of 1458. This was 96th percentile for age and sex matched control.  2. Normal coronary origin with right dominance.  3. Concerns for severe CAD in the left main, mid LAD, and proximal RCA (70-99%). Moderate stenosis in the mLCX (50-69%).  4. Small PFO.  RECOMMENDATIONS: 1. Concerns for 3-vessel CAD. CT FFR will be submitted. Cardiac Cath Recommended.  CT FFR 01/20/2020 1. Left Main: 0.90; No significant stenosis. 2. Proximal LAD: 0.78; significant stenosis. 3. Mid LAD: 0.57; significant stenosis. 4. Mid LCX: 0.79; significant stenosis. 5. RCA: Occluded.  IMPRESSION: 1.  3-vessel  CAD.  Cardiac Cath recommended.  Recent Labs: 01/17/2020: BUN 25; Creatinine, Ser 1.25; Potassium 5.5; Sodium 141   Recent Lipid Panel No results found for: CHOL, TRIG, HDL, CHOLHDL, VLDL, LDLCALC, LDLDIRECT  Physical Exam:   VS:  BP (!) 168/76   Pulse (!) 55   Ht 5\' 3"  (1.6 m)   Wt 144 lb 9.6 oz (65.6 kg)   SpO2 97%   BMI 25.61 kg/m    Wt Readings from Last 3 Encounters:  01/23/20 144 lb 9.6 oz (65.6 kg)  01/19/20 141 lb 1.5 oz (64 kg)  12/08/19 140 lb (63.5 kg)    General: Well nourished, well developed, in no acute distress Heart: Atraumatic, normal size  Eyes: PEERLA, EOMI  Neck: Supple, no JVD Endocrine: No thryomegaly Cardiac: Normal S1, S2; RRR; no murmurs, rubs, or gallops Lungs: Clear to auscultation bilaterally, no wheezing, rhonchi or rales  Abd: Soft, nontender, no hepatomegaly  Ext: No edema, pulses 2+ Musculoskeletal: No deformities, BUE and BLE strength normal and equal Skin: Warm and dry, no rashes   Neuro: Alert and oriented to person, place, time, and situation, CNII-XII grossly intact, no focal deficits  Psych: Normal mood and affect   ASSESSMENT:   [name] [name] is a 79 y.o. female who presents for the following: 1. Coronary artery disease of native artery of native heart with stable angina pectoris ([place])   2. Essential hypertension   3.  Mixed hyperlipidemia     PLAN:   1. Coronary artery disease of native artery of native heart with stable angina pectoris ([place]) -She has been evaluated for symptoms of stable angina.  She started on aspirin as well as metoprolol tartrate with improvement in symptoms.  She takes nitroglycerin infrequently.  Her cardiac SPECT was normal.  Cardiac CTA shows three-vessel CAD.  Suspect she has balanced ischemia on her cardiac SPECT.  We will proceed with cardiac catheterization to better define the anatomy.  She may end up having disease that merits surgical intervention.  We will plan to have this done on Thursday with Dr. Pernell Dupre. -She took steroids prior to her cardiac CTA due to reported issues with contrast from her nuclear medicine stress test.  I think this is more related to adhesives versus the actual contrast.  She reports no issues with prior contrast that she is aware of.  We will forego steroids prior to her cardiac cath.  I discussed this with the patient and she was in agreement with this plan.  2. Essential hypertension -Blood pressure a bit elevated today.  Suspect this is related to anxiety of her diagnosis.  We will check a BMP today to see what her potassium is.  I like we will try to switch her off lisinopril and onto a calcium channel blocker or diuretic if we are able.  3. Mixed hyperlipidemia Her most recent lipid profile shows an HDL cholesterol 54, total cholesterol 140, LDL 60, triglycerides 131 -She will continue her aspirin as well as Lipitor 80 mg daily   Disposition: Return in about 1 month (around 02/23/2020).  Medication Adjustments/Labs and Tests Ordered: Current medicines are reviewed at length with the patient today.  Concerns regarding medicines are outlined above.  Orders Placed This Encounter  Procedures  . Basic metabolic panel  . EKG 12-Lead   No orders of the defined types were placed in this encounter.   Patient Instructions  Medication Instructions:   The current medical regimen is effective;  continue present plan and  medications.  *If you need a refill on your cardiac medications before your next appointment, please call your pharmacy*   Lab Work: BMET, [employer]  If you have labs (blood work) drawn today and your tests are completely normal, you will receive your results only by: Marland Kitchen MyChart Message (if you have MyChart) OR . A paper copy in the mail If you have any lab test that is abnormal or we need to change your treatment, we will call you to review the results.   Testing/Procedures: Your physician has requested that you have a cardiac catheterization. Cardiac catheterization is used to diagnose and/or treat various heart conditions. Doctors may recommend this procedure for a number of different reasons. The most common reason is to evaluate chest pain. Chest pain can be a symptom of coronary artery disease (CAD), and cardiac catheterization can show whether plaque is narrowing or blocking your heart's arteries. This procedure is also used to evaluate the valves, as well as measure the blood flow and oxygen levels in different parts of your heart. For further information please visit [email or web address]. Please follow instruction sheet, as given.   Follow-Up: At [facility], you and your health needs are our priority.  As part of our continuing mission to provide you with exceptional heart care, we have created designated Provider Care Teams.  These Care Teams include your primary Cardiologist (physician) and Advanced Practice Providers (APPs -  Physician Assistants and Nurse Practitioners) who all work together to provide you with the care you need, when you need it.  We recommend signing up for the patient portal called "MyChart".  Sign up information is provided on this After Visit Summary.  MyChart is used to connect with patients for Virtual Visits (Telemedicine).  Patients are able to  view lab/test results, encounter notes, upcoming appointments, etc.  Non-urgent messages can be sent to your provider as well.   To learn more about what you can do with MyChart, go to [email or web address].    Your next appointment:   1 month(s)  The format for your next appointment:   In Person  Provider:   Eleonore Chiquito, MD   Other Instructions    [address] [place] [address] Dept: [phone] Loc: [place]  01/23/2020  You are scheduled for a Cardiac Catheterization on Thursday, March 11 with Dr. Daneen Schick.  1. Please arrive at the [facility] (Main Entrance A) at [facility]: [address] [place], [place] [address] at 10:00 AM (This time is two hours before your procedure to ensure your preparation). Free valet parking service is available.   Special note: Every effort is made to have your procedure done on time. Please understand that emergencies sometimes delay scheduled procedures.  2. Diet: Do not eat solid foods after midnight.  The patient may have clear liquids until 5am upon the day of the procedure.  3. Labs: You will need to have blood drawn (BMET). You do not need to be fasting.  4. Medication instructions in preparation for your procedure:  On the morning of your procedure, take your Aspirin and any morning medicines NOT listed above.  You may use sips of water.  5. Plan for one night stay--bring personal belongings. 6. Bring a current list of your medications and current insurance cards. 7. You MUST have a responsible person to drive you home. 8.  Someone MUST be with you the first 24 hours after you arrive home or your discharge will be delayed. 9. Please wear clothes that are easy to get on and off and wear slip-on shoes.  Thank you for allowing [place] to care for you!   -- [facility]  [facility]      Time Spent with Patient: I have spent a total of 35 minutes with patient reviewing hospital notes, telemetry, EKGs, labs and examining the patient as well as establishing an assessment and plan that was discussed with the patient.  > 50% of time was spent in direct patient care.  Signed, Addison Naegeli. Audie Box, [place]  [address], [place], [place] [address] [phone]  01/23/2020 12:44 PM

## 2020-01-22 NOTE — H&P (View-Only) (Signed)
Cardiology Office Note:   Date:  01/23/2020  NAME:  [name] [name]    MRN: [id number] DOB:  [birth year]   PCP:  [name], MD  Cardiologist:  Evalina Field, MD   Referring MD: [name], MD   Chief Complaint  Patient presents with  . Coronary Artery Disease   History of Present Illness:   [name] [name] is a 79 y.o. female with a hx of CAD, HTN, HLD who presents for follow-up of CAD. Symptoms of typical angina. Normal SPECT but due to persistent symptoms we pursued CCTA which showed 3 vessel CAD. She presents today to discuss cath.  She reports she still is shocked that her cardiac CTA shows three-vessel CAD.  She reports that she has been doing well since our last visit.  She reports her last episode of exertional chest pain occurred 1 week ago.  She actually reports the pain went away with cessation of activity.  She did not take any nitroglycerin.  Her blood pressure is a bit elevated today.  I suspect a lot of this is anxiety.  I did discuss the option of coming off lisinopril given the recent potassium issues.  She is not willing to do this right now.  I think we should probably switch her over to amlodipine if she is willing.  We will recheck a BMP today.  She reports she is okay to proceed with cardiac catheterization to define her anatomy.  I did express the importance of this as a cardiac CTA is unable to definitively show what the anatomy is given the significant coronary calcium.  Problem List 1. CAD -normal SPECT, 3v CAD by CCTA  2. HTN 3. HLD (total cholesterol 140, HDL 54, LDL 60, triglycerides 133, 04/01/2019) 4. Hypothyroidism 5. Carotid artery disease -R ICA 40-59% -L ICA 40-59% 6. Trigeminal neuralgia s/p gamma knife  7. 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 HTN (hypertension)   . Hyperlipidemia   . Hypothyroidism   . Osteoporosis   . 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 ABDOMINAL HYSTERECTOMY    . ANKLE SURGERY    . APPENDECTOMY    . CHOLECYSTECTOMY    . gamma knife    . THYROIDECTOMY    . TOTAL HIP ARTHROPLASTY      Current Medications: Current Meds  Medication Sig  . acetaminophen (TYLENOL) 500 MG tablet Take 1,000 mg by mouth every 6 (six) hours as needed.  Marland Kitchen aspirin EC 81 MG tablet Take 81 mg by mouth at bedtime.  Marland Kitchen atorvastatin (LIPITOR) 20 MG tablet Take 80 mg by mouth daily.   . cholecalciferol (VITAMIN D) 1000 UNITS tablet Take 1,000 Units by mouth daily.   Marland Kitchen ibuprofen (ADVIL,MOTRIN) 600 MG tablet Take 600 mg by mouth every 6 (six) hours as needed.  Marland Kitchen levothyroxine (SYNTHROID, LEVOTHROID) 200 MCG tablet Take 200 mcg by mouth daily before breakfast. Whole tablet SUN-TUES-THURS-SAT 1/2 tab MON-WEDS-FRI  . lisinopril (PRINIVIL,ZESTRIL) 10 MG tablet Take 20 mg by mouth daily.   . metoprolol tartrate (LOPRESSOR) 25 MG tablet Take 1 tablet (25 mg total) by mouth 2 (two) times daily.  . naproxen (NAPROSYN) 500 MG tablet Take 500 mg by mouth 2 (two) times daily as needed.  . nitroGLYCERIN (NITROSTAT) 0.4 MG SL tablet Place 1 tablet (0.4 mg total) under the tongue every 5 (five) minutes as  needed for chest pain.  . predniSONE (DELTASONE) 50 MG tablet Take one tablet 13 hours prior to CT, take one tablet 7 hours prior to CT, take one tablet 1 hour prior to CT.  Marland Kitchen Prenatal Vit-Fe Fumarate-FA (PRENATAL MULTIVITAMIN) TABS tablet Take 1 tablet by mouth daily at 12 noon.  . vitamin B-12 (CYANOCOBALAMIN) 1000 MCG tablet Take 2,500 mcg by mouth daily.     Allergies:    Bee venom, Penicillins, Sulfa antibiotics, Adhesive [tape], and Morphine and related   Social History: Social History   Socioeconomic History  . Marital status: Divorced    Spouse name: Not on file  . Number of children: Not on file  . Years of education: Not on file  . Highest education level: Not on file    Occupational History  . Occupation: retired Designer, jewellery  Tobacco Use  . Smoking status: Never Smoker  . Smokeless tobacco: Never Used  Substance and Sexual Activity  . Alcohol use: Not Currently  . Drug use: Never  . Sexual activity: Not on file  Other Topics Concern  . Not on file  Social History Narrative  . Not on file   Social Determinants of Health   Financial Resource Strain:   . Difficulty of Paying Living Expenses: Not on file  Food Insecurity:   . Worried About Charity fundraiser in the Last Year: Not on file  . Ran Out of Food in the Last Year: Not on file  Transportation Needs:   . Lack of Transportation (Medical): Not on file  . Lack of Transportation (Non-Medical): Not on file  Physical Activity:   . Days of Exercise per Week: Not on file  . Minutes of Exercise per Session: Not on file  Stress:   . Feeling of Stress : Not on file  Social Connections:   . Frequency of Communication with Friends and Family: Not on file  . Frequency of Social Gatherings with Friends and Family: Not on file  . Attends Religious Services: Not on file  . Active Member of Clubs or Organizations: Not on file  . Attends Archivist Meetings: Not on file  . Marital Status: Not on file     Family History: The patient's family history includes Breast cancer (age of onset: 85) in her mother; Cancer in her brother; Cirrhosis in her father; Diabetes in her mother; Heart attack in her sister; Heart disease in her brother and mother; Hypertension in her mother.  ROS:   All other ROS reviewed and negative. Pertinent positives noted in the HPI.     EKGs/Labs/Other Studies Reviewed:   The following studies were personally reviewed by me today:  EKG:  EKG is ordered today.  The ekg ordered today demonstrates sinus bradycardia, heart rate 55, no acute EKG changes, no evidence of prior infarction, and was personally reviewed by me.   CCTA 01/19/2020 IMPRESSION: 1. Coronary  calcium score of 1458. This was 96th percentile for age and sex matched control.  2. Normal coronary origin with right dominance.  3. Concerns for severe CAD in the left main, mid LAD, and proximal RCA (70-99%). Moderate stenosis in the mLCX (50-69%).  4. Small PFO.  RECOMMENDATIONS: 1. Concerns for 3-vessel CAD. CT FFR will be submitted. Cardiac Cath Recommended.  CT FFR 01/20/2020 1. Left Main: 0.90; No significant stenosis. 2. Proximal LAD: 0.78; significant stenosis. 3. Mid LAD: 0.57; significant stenosis. 4. Mid LCX: 0.79; significant stenosis. 5. RCA: Occluded.  IMPRESSION: 1.  3-vessel  CAD.  Cardiac Cath recommended.  Recent Labs: 01/17/2020: BUN 25; Creatinine, Ser 1.25; Potassium 5.5; Sodium 141   Recent Lipid Panel No results found for: CHOL, TRIG, HDL, CHOLHDL, VLDL, LDLCALC, LDLDIRECT  Physical Exam:   VS:  BP (!) 168/76   Pulse (!) 55   Ht 5\' 3"  (1.6 m)   Wt 144 lb 9.6 oz (65.6 kg)   SpO2 97%   BMI 25.61 kg/m    Wt Readings from Last 3 Encounters:  01/23/20 144 lb 9.6 oz (65.6 kg)  01/19/20 141 lb 1.5 oz (64 kg)  12/08/19 140 lb (63.5 kg)    General: Well nourished, well developed, in no acute distress Heart: Atraumatic, normal size  Eyes: PEERLA, EOMI  Neck: Supple, no JVD Endocrine: No thryomegaly Cardiac: Normal S1, S2; RRR; no murmurs, rubs, or gallops Lungs: Clear to auscultation bilaterally, no wheezing, rhonchi or rales  Abd: Soft, nontender, no hepatomegaly  Ext: No edema, pulses 2+ Musculoskeletal: No deformities, BUE and BLE strength normal and equal Skin: Warm and dry, no rashes   Neuro: Alert and oriented to person, place, time, and situation, CNII-XII grossly intact, no focal deficits  Psych: Normal mood and affect   ASSESSMENT:   [name] D Capps is a 79 y.o. female who presents for the following: 1. Coronary artery disease of native artery of native heart with stable angina pectoris ([place])   2. Essential hypertension   3.  Mixed hyperlipidemia     PLAN:   1. Coronary artery disease of native artery of native heart with stable angina pectoris ([place]) -She has been evaluated for symptoms of stable angina.  She started on aspirin as well as metoprolol tartrate with improvement in symptoms.  She takes nitroglycerin infrequently.  Her cardiac SPECT was normal.  Cardiac CTA shows three-vessel CAD.  Suspect she has balanced ischemia on her cardiac SPECT.  We will proceed with cardiac catheterization to better define the anatomy.  She may end up having disease that merits surgical intervention.  We will plan to have this done on Thursday with Dr. Pernell Dupre. -She took steroids prior to her cardiac CTA due to reported issues with contrast from her nuclear medicine stress test.  I think this is more related to adhesives versus the actual contrast.  She reports no issues with prior contrast that she is aware of.  We will forego steroids prior to her cardiac cath.  I discussed this with the patient and she was in agreement with this plan.  2. Essential hypertension -Blood pressure a bit elevated today.  Suspect this is related to anxiety of her diagnosis.  We will check a BMP today to see what her potassium is.  I like we will try to switch her off lisinopril and onto a calcium channel blocker or diuretic if we are able.  3. Mixed hyperlipidemia Her most recent lipid profile shows an HDL cholesterol 54, total cholesterol 140, LDL 60, triglycerides 131 -She will continue her aspirin as well as Lipitor 80 mg daily   Disposition: Return in about 1 month (around 02/23/2020).  Medication Adjustments/Labs and Tests Ordered: Current medicines are reviewed at length with the patient today.  Concerns regarding medicines are outlined above.  Orders Placed This Encounter  Procedures  . Basic metabolic panel  . EKG 12-Lead   No orders of the defined types were placed in this encounter.   Patient Instructions  Medication Instructions:   The current medical regimen is effective;  continue present plan and  medications.  *If you need a refill on your cardiac medications before your next appointment, please call your pharmacy*   Lab Work: BMET, [place] [employer]  If you have labs (blood work) drawn today and your tests are completely normal, you will receive your results only by: Marland Kitchen MyChart Message (if you have MyChart) OR . A paper copy in the mail If you have any lab test that is abnormal or we need to change your treatment, we will call you to review the results.   Testing/Procedures: Your physician has requested that you have a cardiac catheterization. Cardiac catheterization is used to diagnose and/or treat various heart conditions. Doctors may recommend this procedure for a number of different reasons. The most common reason is to evaluate chest pain. Chest pain can be a symptom of coronary artery disease (CAD), and cardiac catheterization can show whether plaque is narrowing or blocking your heart's arteries. This procedure is also used to evaluate the valves, as well as measure the blood flow and oxygen levels in different parts of your heart. For further information please visit HugeFiesta.[email or web address]. Please follow instruction sheet, as given.   Follow-Up: At [facility], you and your health needs are our priority.  As part of our continuing mission to provide you with exceptional heart care, we have created designated Provider Care Teams.  These Care Teams include your primary Cardiologist (physician) and Advanced Practice Providers (APPs -  Physician Assistants and Nurse Practitioners) who all work together to provide you with the care you need, when you need it.  We recommend signing up for the patient portal called "MyChart".  Sign up information is provided on this After Visit Summary.  MyChart is used to connect with patients for Virtual Visits (Telemedicine).  Patients are able to  view lab/test results, encounter notes, upcoming appointments, etc.  Non-urgent messages can be sent to your provider as well.   To learn more about what you can do with MyChart, go to [email or web address].    Your next appointment:   1 month(s)  The format for your next appointment:   In Person  Provider:   Eleonore Chiquito, MD   Other Instructions    [place] [address] Dept: [phone] Loc: [place]  01/23/2020  You are scheduled for a Cardiac Catheterization on Thursday, March 11 with Dr. Daneen Schick.  1. Please arrive at the [facility] (Main Entrance A) at [facility]: [address] [place], [place] [address] at 10:00 AM (This time is two hours before your procedure to ensure your preparation). Free valet parking service is available.   Special note: Every effort is made to have your procedure done on time. Please understand that emergencies sometimes delay scheduled procedures.  2. Diet: Do not eat solid foods after midnight.  The patient may have clear liquids until 5am upon the day of the procedure.  3. Labs: You will need to have blood drawn (BMET). You do not need to be fasting.  4. Medication instructions in preparation for your procedure:  On the morning of your procedure, take your Aspirin and any morning medicines NOT listed above.  You may use sips of water.  5. Plan for one night stay--bring personal belongings. 6. Bring a current list of your medications and current insurance cards. 7. You MUST have a responsible person to drive you home. 8.  Someone MUST be with you the first 24 hours after you arrive home or your discharge will be delayed. 9. Please wear clothes that are easy to get on and off and wear slip-on shoes.  Thank you for allowing [place] to care for you!   -- [place] [facility]  [facility]      Time Spent with Patient: I have spent a total of 35 minutes with patient reviewing hospital notes, telemetry, EKGs, labs and examining the patient as well as establishing an assessment and plan that was discussed with the patient.  > 50% of time was spent in direct patient care.  Signed, Addison Naegeli. Audie Box, [place]  [address], [place], [place] [address] [phone]  01/23/2020 12:44 PM

## 2020-01-23 ENCOUNTER — Other Ambulatory Visit: Payer: Self-pay

## 2020-01-23 ENCOUNTER — Ambulatory Visit: Payer: PPO | Admitting: Cardiovascular Disease

## 2020-01-23 ENCOUNTER — Encounter: Payer: Self-pay | Admitting: Cardiovascular Disease

## 2020-01-23 ENCOUNTER — Other Ambulatory Visit (HOSPITAL_COMMUNITY)
Admission: RE | Admit: 2020-01-23 | Discharge: 2020-01-23 | Disposition: A | Discharge status: Home / Self Care | Payer: PPO | Source: Ambulatory Visit | Attending: Interventional Cardiology | Admitting: Interventional Cardiology

## 2020-01-23 VITALS — BP 168/76 | HR 55 | Ht 63.0 in | Wt 144.6 lb

## 2020-01-23 DIAGNOSIS — I10 Essential (primary) hypertension: Secondary | ICD-10-CM

## 2020-01-23 DIAGNOSIS — R079 Chest pain, unspecified: Secondary | ICD-10-CM

## 2020-01-23 DIAGNOSIS — I25118 Atherosclerotic heart disease of native coronary artery with other forms of angina pectoris: Secondary | ICD-10-CM

## 2020-01-23 DIAGNOSIS — Z20822 Contact with and (suspected) exposure to covid-19: Secondary | ICD-10-CM | POA: Diagnosis not present

## 2020-01-23 DIAGNOSIS — Z01812 Encounter for preprocedural laboratory examination: Secondary | ICD-10-CM | POA: Insufficient documentation

## 2020-01-23 DIAGNOSIS — E785 Hyperlipidemia, unspecified: Secondary | ICD-10-CM | POA: Diagnosis not present

## 2020-01-23 DIAGNOSIS — I2584 Coronary atherosclerosis due to calcified coronary lesion: Secondary | ICD-10-CM | POA: Diagnosis not present

## 2020-01-23 DIAGNOSIS — N1831 Chronic kidney disease, stage 3a: Secondary | ICD-10-CM | POA: Diagnosis not present

## 2020-01-23 DIAGNOSIS — E782 Mixed hyperlipidemia: Secondary | ICD-10-CM | POA: Diagnosis not present

## 2020-01-23 DIAGNOSIS — Z1331 Encounter for screening for depression: Secondary | ICD-10-CM | POA: Diagnosis not present

## 2020-01-23 NOTE — Patient Instructions (Addendum)
Medication Instructions:  The current medical regimen is effective;  continue present plan and medications.  *If you need a refill on your cardiac medications before your next appointment, please call your pharmacy*   Lab Work: BMET, [place] [employer]  If you have labs (blood work) drawn today and your tests are completely normal, you will receive your results only by: Marland Kitchen MyChart Message (if you have MyChart) OR . A paper copy in the mail If you have any lab test that is abnormal or we need to change your treatment, we will call you to review the results.   Testing/Procedures: Your physician has requested that you have a cardiac catheterization. Cardiac catheterization is used to diagnose and/or treat various heart conditions. Doctors may recommend this procedure for a number of different reasons. The most common reason is to evaluate chest pain. Chest pain can be a symptom of coronary artery disease (CAD), and cardiac catheterization can show whether plaque is narrowing or blocking your heart's arteries. This procedure is also used to evaluate the valves, as well as measure the blood flow and oxygen levels in different parts of your heart. For further information please visit HugeFiesta.[email or web address]. Please follow instruction sheet, as given.   Follow-Up: At [facility], you and your health needs are our priority.  As part of our continuing mission to provide you with exceptional heart care, we have created designated Provider Care Teams.  These Care Teams include your primary Cardiologist (physician) and Advanced Practice Providers (APPs -  Physician Assistants and Nurse Practitioners) who all work together to provide you with the care you need, when you need it.  We recommend signing up for the patient portal called "MyChart".  Sign up information is provided on this After Visit Summary.  MyChart is used to connect with patients for Virtual Visits  (Telemedicine).  Patients are able to view lab/test results, encounter notes, upcoming appointments, etc.  Non-urgent messages can be sent to your provider as well.   To learn more about what you can do with MyChart, go to [email or web address].    Your next appointment:   1 month(s)  The format for your next appointment:   In Person  Provider:   Eleonore Chiquito, MD   Other Instructions    [place] [address] Dept: [phone] Loc: [place]  01/23/2020  You are scheduled for a Cardiac Catheterization on Thursday, March 11 with Dr. Daneen Schick.  1. Please arrive at the [facility] (Main Entrance A) at [facility]: [address] [place], [place] [address] at 10:00 AM (This time is two hours before your procedure to ensure your preparation). Free valet parking service is available.   Special note: Every effort is made to have your procedure done on time. Please understand that emergencies sometimes delay scheduled procedures.  2. Diet: Do not eat solid foods after midnight.  The patient may have clear liquids until 5am upon the day of the procedure.  3. Labs: You will need to have blood drawn (BMET). You do not need to be fasting.  4. Medication instructions in preparation for your procedure:  On the morning of your procedure, take your Aspirin and any morning medicines NOT listed above.  You may use sips of water.  5. Plan for one night stay--bring personal belongings. 6. Bring a current list of your medications and current  insurance cards. 7. You MUST have a responsible person to drive you home. 8. Someone MUST be with you the first 24 hours after you arrive home or your discharge will be delayed. 9. Please wear clothes that are easy to get on and off and wear slip-on shoes.  Thank you for allowing [place] to care for you!   -- [facility] services

## 2020-01-24 ENCOUNTER — Other Ambulatory Visit: Payer: Self-pay

## 2020-01-24 ENCOUNTER — Ambulatory Visit: Payer: PPO | Admitting: Cardiovascular Disease

## 2020-01-24 DIAGNOSIS — R079 Chest pain, unspecified: Secondary | ICD-10-CM | POA: Diagnosis not present

## 2020-01-24 LAB — BASIC METABOLIC PANEL
BUN/Creatinine Ratio: 21 (ref 12–28)
BUN: 18 mg/dL (ref 8–27)
CO2: 22 mmol/L (ref 20–29)
Calcium: 10.7 mg/dL — ABNORMAL HIGH (ref 8.7–10.3)
Chloride: 106 mmol/L (ref 96–106)
Creatinine, Ser: 0.84 mg/dL (ref 0.57–1.00)
GFR calc Af Amer: 78 mL/min/{1.73_m2} (ref >59)
GFR calc non Af Amer: 68 mL/min/{1.73_m2} (ref >59)
Glucose: 91 mg/dL (ref 65–99)
Potassium: 5.4 mmol/L — ABNORMAL HIGH (ref 3.5–5.2)
Sodium: 142 mmol/L (ref 134–144)

## 2020-01-24 LAB — SARS CORONAVIRUS 2 (TAT 6-24 HRS): SARS Coronavirus 2: NEGATIVE

## 2020-01-24 LAB — CBC
Hematocrit: 34.9 % (ref 34.0–46.6)
Hemoglobin: 11.7 g/dL (ref 11.1–15.9)
MCH: 29.9 pg (ref 26.6–33.0)
MCHC: 33.5 g/dL (ref 31.5–35.7)
MCV: 89 fL (ref 79–97)
Platelets: 238 10*3/uL (ref 150–450)
RBC: 3.91 x10E6/uL (ref 3.77–5.28)
RDW: 13.4 % (ref 11.7–15.4)
WBC: 7.7 10*3/uL (ref 3.4–10.8)

## 2020-01-25 ENCOUNTER — Telehealth: Payer: Self-pay | Admitting: *Deleted

## 2020-01-25 NOTE — Telephone Encounter (Signed)
Pt contacted pre-catheterization scheduled at [facility] for: Thursday January 26, 2020 12 noon Verified arrival time and place: [facility]) at: 10 AM   No solid food after midnight prior to cath, clear liquids until 5 AM day of procedure. Contrast allergy: see Dr Kathalene Frames note 01/23/20  AM meds can be  taken pre-cath with sip of water including: ASA 81 mg   Confirmed patient has responsible adult to drive home post procedure and observe 24 hours after arriving home: yes  Currently, due to Covid-19 pandemic, only one person will be allowed with patient. Must be the same person for patient's entire stay and will be required to wear a mask. They will be asked to wait in the waiting room for the duration of the patient's stay.  Patients are required to wear a mask when they enter the hospital.      COVID-19 Pre-Screening Questions:  . In the past 7 to 10 days have you had a cough,  shortness of breath, headache, congestion, fever (100 or greater) body aches, chills, sore throat, or sudden loss of taste or sense of smell? no . Have you been around anyone with known Covid 19 in the past 7-10 days? no . Have you been around anyone who is awaiting Covid 19 test results in the past 7 to 10 days? no . Have you been around anyone who has been exposed to Covid 19, or has mentioned symptoms of Covid 19 within the past 7 to 10 days? No   I reviewed procedure/mask/visitor instructions, COVID-19 screening questions with patient, she verbalized understanding, thanked me for call.  Pt asked me to note that when she had renal artery angiogram several years ago, she had bleeding at femoral artery site and required transfusion 1 unit of blood. I asked her to also mention this to nurses when she arrived at Short Stay tomorrow for procedure.

## 2020-01-26 ENCOUNTER — Inpatient Hospital Stay (HOSPITAL_COMMUNITY)
Admission: RE | Admit: 2020-01-26 | Discharge: 2020-02-21 | DRG: 003 | Disposition: A | Discharge status: Skilled Nursing Facility | Payer: PPO | Attending: Cardiothoracic Surgery | Admitting: Cardiothoracic Surgery

## 2020-01-26 ENCOUNTER — Inpatient Hospital Stay (HOSPITAL_COMMUNITY): Payer: PPO

## 2020-01-26 ENCOUNTER — Encounter (HOSPITAL_COMMUNITY)
Admission: RE | Disposition: A | Discharge status: Skilled Nursing Facility | Payer: PPO | Source: Home / Self Care | Attending: Cardiothoracic Surgery

## 2020-01-26 ENCOUNTER — Encounter (HOSPITAL_COMMUNITY): Payer: Self-pay | Admitting: Interventional Cardiology

## 2020-01-26 ENCOUNTER — Other Ambulatory Visit: Payer: Self-pay

## 2020-01-26 DIAGNOSIS — Z4682 Encounter for fitting and adjustment of non-vascular catheter: Secondary | ICD-10-CM | POA: Diagnosis not present

## 2020-01-26 DIAGNOSIS — R2681 Unsteadiness on feet: Secondary | ICD-10-CM | POA: Diagnosis not present

## 2020-01-26 DIAGNOSIS — Z01810 Encounter for preprocedural cardiovascular examination: Secondary | ICD-10-CM

## 2020-01-26 DIAGNOSIS — G8191 Hemiplegia, unspecified affecting right dominant side: Secondary | ICD-10-CM | POA: Diagnosis not present

## 2020-01-26 DIAGNOSIS — G500 Trigeminal neuralgia: Secondary | ICD-10-CM | POA: Diagnosis not present

## 2020-01-26 DIAGNOSIS — R29810 Facial weakness: Secondary | ICD-10-CM | POA: Diagnosis not present

## 2020-01-26 DIAGNOSIS — D72829 Elevated white blood cell count, unspecified: Secondary | ICD-10-CM | POA: Diagnosis not present

## 2020-01-26 DIAGNOSIS — E663 Overweight: Secondary | ICD-10-CM | POA: Diagnosis present

## 2020-01-26 DIAGNOSIS — Z96649 Presence of unspecified artificial hip joint: Secondary | ICD-10-CM | POA: Diagnosis present

## 2020-01-26 DIAGNOSIS — I25110 Atherosclerotic heart disease of native coronary artery with unstable angina pectoris: Secondary | ICD-10-CM | POA: Diagnosis not present

## 2020-01-26 DIAGNOSIS — Z713 Dietary counseling and surveillance: Secondary | ICD-10-CM

## 2020-01-26 DIAGNOSIS — I6389 Other cerebral infarction: Secondary | ICD-10-CM | POA: Diagnosis not present

## 2020-01-26 DIAGNOSIS — Q211 Atrial septal defect: Secondary | ICD-10-CM | POA: Diagnosis not present

## 2020-01-26 DIAGNOSIS — R1312 Dysphagia, oropharyngeal phase: Secondary | ICD-10-CM | POA: Diagnosis not present

## 2020-01-26 DIAGNOSIS — I639 Cerebral infarction, unspecified: Secondary | ICD-10-CM | POA: Diagnosis not present

## 2020-01-26 DIAGNOSIS — Z7982 Long term (current) use of aspirin: Secondary | ICD-10-CM

## 2020-01-26 DIAGNOSIS — R4182 Altered mental status, unspecified: Secondary | ICD-10-CM | POA: Diagnosis not present

## 2020-01-26 DIAGNOSIS — Z789 Other specified health status: Secondary | ICD-10-CM | POA: Diagnosis not present

## 2020-01-26 DIAGNOSIS — Z85850 Personal history of malignant neoplasm of thyroid: Secondary | ICD-10-CM | POA: Diagnosis not present

## 2020-01-26 DIAGNOSIS — Z743 Need for continuous supervision: Secondary | ICD-10-CM | POA: Diagnosis not present

## 2020-01-26 DIAGNOSIS — Z9689 Presence of other specified functional implants: Secondary | ICD-10-CM

## 2020-01-26 DIAGNOSIS — Z951 Presence of aortocoronary bypass graft: Secondary | ICD-10-CM

## 2020-01-26 DIAGNOSIS — R569 Unspecified convulsions: Secondary | ICD-10-CM | POA: Diagnosis not present

## 2020-01-26 DIAGNOSIS — I67848 Other cerebrovascular vasospasm and vasoconstriction: Secondary | ICD-10-CM | POA: Diagnosis not present

## 2020-01-26 DIAGNOSIS — R41841 Cognitive communication deficit: Secondary | ICD-10-CM | POA: Diagnosis not present

## 2020-01-26 DIAGNOSIS — D62 Acute posthemorrhagic anemia: Secondary | ICD-10-CM | POA: Diagnosis not present

## 2020-01-26 DIAGNOSIS — I110 Hypertensive heart disease with heart failure: Secondary | ICD-10-CM | POA: Diagnosis present

## 2020-01-26 DIAGNOSIS — J939 Pneumothorax, unspecified: Secondary | ICD-10-CM | POA: Diagnosis not present

## 2020-01-26 DIAGNOSIS — N179 Acute kidney failure, unspecified: Secondary | ICD-10-CM | POA: Diagnosis not present

## 2020-01-26 DIAGNOSIS — G939 Disorder of brain, unspecified: Secondary | ICD-10-CM | POA: Diagnosis present

## 2020-01-26 DIAGNOSIS — R531 Weakness: Secondary | ICD-10-CM | POA: Diagnosis not present

## 2020-01-26 DIAGNOSIS — I97821 Postprocedural cerebrovascular infarction during other surgery: Secondary | ICD-10-CM | POA: Diagnosis not present

## 2020-01-26 DIAGNOSIS — R072 Precordial pain: Secondary | ICD-10-CM | POA: Diagnosis not present

## 2020-01-26 DIAGNOSIS — I69320 Aphasia following cerebral infarction: Secondary | ICD-10-CM | POA: Diagnosis not present

## 2020-01-26 DIAGNOSIS — Z9889 Other specified postprocedural states: Secondary | ICD-10-CM

## 2020-01-26 DIAGNOSIS — Z9049 Acquired absence of other specified parts of digestive tract: Secondary | ICD-10-CM

## 2020-01-26 DIAGNOSIS — Z79890 Hormone replacement therapy (postmenopausal): Secondary | ICD-10-CM

## 2020-01-26 DIAGNOSIS — E039 Hypothyroidism, unspecified: Secondary | ICD-10-CM | POA: Diagnosis not present

## 2020-01-26 DIAGNOSIS — Z885 Allergy status to narcotic agent status: Secondary | ICD-10-CM

## 2020-01-26 DIAGNOSIS — I2693 Single subsegmental pulmonary embolism without acute cor pulmonale: Secondary | ICD-10-CM | POA: Diagnosis not present

## 2020-01-26 DIAGNOSIS — E890 Postprocedural hypothyroidism: Secondary | ICD-10-CM | POA: Diagnosis present

## 2020-01-26 DIAGNOSIS — R279 Unspecified lack of coordination: Secondary | ICD-10-CM | POA: Diagnosis not present

## 2020-01-26 DIAGNOSIS — I82442 Acute embolism and thrombosis of left tibial vein: Secondary | ICD-10-CM | POA: Diagnosis not present

## 2020-01-26 DIAGNOSIS — T884XXA Failed or difficult intubation, initial encounter: Secondary | ICD-10-CM

## 2020-01-26 DIAGNOSIS — Z6827 Body mass index (BMI) 27.0-27.9, adult: Secondary | ICD-10-CM

## 2020-01-26 DIAGNOSIS — I6602 Occlusion and stenosis of left middle cerebral artery: Secondary | ICD-10-CM | POA: Diagnosis not present

## 2020-01-26 DIAGNOSIS — J95821 Acute postprocedural respiratory failure: Secondary | ICD-10-CM | POA: Diagnosis not present

## 2020-01-26 DIAGNOSIS — M6281 Muscle weakness (generalized): Secondary | ICD-10-CM | POA: Diagnosis not present

## 2020-01-26 DIAGNOSIS — I25118 Atherosclerotic heart disease of native coronary artery with other forms of angina pectoris: Secondary | ICD-10-CM | POA: Diagnosis not present

## 2020-01-26 DIAGNOSIS — B379 Candidiasis, unspecified: Secondary | ICD-10-CM | POA: Diagnosis not present

## 2020-01-26 DIAGNOSIS — I5021 Acute systolic (congestive) heart failure: Secondary | ICD-10-CM | POA: Diagnosis present

## 2020-01-26 DIAGNOSIS — E782 Mixed hyperlipidemia: Secondary | ICD-10-CM | POA: Diagnosis not present

## 2020-01-26 DIAGNOSIS — J9601 Acute respiratory failure with hypoxia: Secondary | ICD-10-CM

## 2020-01-26 DIAGNOSIS — R4701 Aphasia: Secondary | ICD-10-CM | POA: Diagnosis not present

## 2020-01-26 DIAGNOSIS — J90 Pleural effusion, not elsewhere classified: Secondary | ICD-10-CM | POA: Diagnosis not present

## 2020-01-26 DIAGNOSIS — Z8249 Family history of ischemic heart disease and other diseases of the circulatory system: Secondary | ICD-10-CM

## 2020-01-26 DIAGNOSIS — Z9282 Status post administration of tPA (rtPA) in a different facility within the last 24 hours prior to admission to current facility: Secondary | ICD-10-CM | POA: Diagnosis not present

## 2020-01-26 DIAGNOSIS — I371 Nonrheumatic pulmonary valve insufficiency: Secondary | ICD-10-CM | POA: Diagnosis not present

## 2020-01-26 DIAGNOSIS — Z978 Presence of other specified devices: Secondary | ICD-10-CM

## 2020-01-26 DIAGNOSIS — I679 Cerebrovascular disease, unspecified: Secondary | ICD-10-CM | POA: Diagnosis not present

## 2020-01-26 DIAGNOSIS — Z79899 Other long term (current) drug therapy: Secondary | ICD-10-CM

## 2020-01-26 DIAGNOSIS — Z4659 Encounter for fitting and adjustment of other gastrointestinal appliance and device: Secondary | ICD-10-CM

## 2020-01-26 DIAGNOSIS — I6523 Occlusion and stenosis of bilateral carotid arteries: Secondary | ICD-10-CM | POA: Diagnosis not present

## 2020-01-26 DIAGNOSIS — I2510 Atherosclerotic heart disease of native coronary artery without angina pectoris: Secondary | ICD-10-CM | POA: Diagnosis present

## 2020-01-26 DIAGNOSIS — Z8673 Personal history of transient ischemic attack (TIA), and cerebral infarction without residual deficits: Secondary | ICD-10-CM

## 2020-01-26 DIAGNOSIS — I63512 Cerebral infarction due to unspecified occlusion or stenosis of left middle cerebral artery: Secondary | ICD-10-CM | POA: Diagnosis not present

## 2020-01-26 DIAGNOSIS — R918 Other nonspecific abnormal finding of lung field: Secondary | ICD-10-CM | POA: Diagnosis not present

## 2020-01-26 DIAGNOSIS — M810 Age-related osteoporosis without current pathological fracture: Secondary | ICD-10-CM | POA: Diagnosis present

## 2020-01-26 DIAGNOSIS — Z882 Allergy status to sulfonamides status: Secondary | ICD-10-CM

## 2020-01-26 DIAGNOSIS — G459 Transient cerebral ischemic attack, unspecified: Secondary | ICD-10-CM | POA: Diagnosis not present

## 2020-01-26 DIAGNOSIS — I82441 Acute embolism and thrombosis of right tibial vein: Secondary | ICD-10-CM | POA: Diagnosis not present

## 2020-01-26 DIAGNOSIS — I10 Essential (primary) hypertension: Secondary | ICD-10-CM | POA: Diagnosis not present

## 2020-01-26 DIAGNOSIS — R079 Chest pain, unspecified: Secondary | ICD-10-CM | POA: Diagnosis not present

## 2020-01-26 DIAGNOSIS — I63312 Cerebral infarction due to thrombosis of left middle cerebral artery: Secondary | ICD-10-CM | POA: Diagnosis not present

## 2020-01-26 DIAGNOSIS — I2699 Other pulmonary embolism without acute cor pulmonale: Secondary | ICD-10-CM | POA: Diagnosis not present

## 2020-01-26 DIAGNOSIS — R29721 NIHSS score 21: Secondary | ICD-10-CM | POA: Diagnosis not present

## 2020-01-26 DIAGNOSIS — Z91048 Other nonmedicinal substance allergy status: Secondary | ICD-10-CM

## 2020-01-26 DIAGNOSIS — I63412 Cerebral infarction due to embolism of left middle cerebral artery: Secondary | ICD-10-CM | POA: Diagnosis not present

## 2020-01-26 DIAGNOSIS — I6529 Occlusion and stenosis of unspecified carotid artery: Secondary | ICD-10-CM | POA: Diagnosis present

## 2020-01-26 DIAGNOSIS — I63511 Cerebral infarction due to unspecified occlusion or stenosis of right middle cerebral artery: Secondary | ICD-10-CM | POA: Diagnosis not present

## 2020-01-26 DIAGNOSIS — R4702 Dysphasia: Secondary | ICD-10-CM | POA: Diagnosis not present

## 2020-01-26 DIAGNOSIS — N390 Urinary tract infection, site not specified: Secondary | ICD-10-CM | POA: Diagnosis not present

## 2020-01-26 DIAGNOSIS — Z20822 Contact with and (suspected) exposure to covid-19: Secondary | ICD-10-CM | POA: Diagnosis present

## 2020-01-26 DIAGNOSIS — I361 Nonrheumatic tricuspid (valve) insufficiency: Secondary | ICD-10-CM | POA: Diagnosis not present

## 2020-01-26 DIAGNOSIS — Z91030 Bee allergy status: Secondary | ICD-10-CM

## 2020-01-26 DIAGNOSIS — Z01812 Encounter for preprocedural laboratory examination: Secondary | ICD-10-CM

## 2020-01-26 DIAGNOSIS — E785 Hyperlipidemia, unspecified: Secondary | ICD-10-CM | POA: Diagnosis not present

## 2020-01-26 DIAGNOSIS — Z90710 Acquired absence of both cervix and uterus: Secondary | ICD-10-CM

## 2020-01-26 DIAGNOSIS — Z930 Tracheostomy status: Secondary | ICD-10-CM

## 2020-01-26 DIAGNOSIS — J384 Edema of larynx: Secondary | ICD-10-CM

## 2020-01-26 DIAGNOSIS — Z880 Allergy status to penicillin: Secondary | ICD-10-CM

## 2020-01-26 DIAGNOSIS — J9811 Atelectasis: Secondary | ICD-10-CM | POA: Diagnosis not present

## 2020-01-26 DIAGNOSIS — I69120 Aphasia following nontraumatic intracerebral hemorrhage: Secondary | ICD-10-CM | POA: Diagnosis not present

## 2020-01-26 HISTORY — PX: LEFT HEART CATH AND CORONARY ANGIOGRAPHY: CATH118249

## 2020-01-26 HISTORY — DX: Atherosclerotic heart disease of native coronary artery without angina pectoris: I25.10

## 2020-01-26 HISTORY — DX: Family history of other specified conditions: Z84.89

## 2020-01-26 LAB — SURGICAL PCR SCREEN
MRSA, PCR: NEGATIVE
Staphylococcus aureus: NEGATIVE

## 2020-01-26 LAB — ABO/RH: ABO/RH(D): A POS

## 2020-01-26 SURGERY — LEFT HEART CATH AND CORONARY ANGIOGRAPHY
Anesthesia: LOCAL

## 2020-01-26 MED ORDER — TRANEXAMIC ACID 1000 MG/10ML IV SOLN
1.5000 mg/kg/h | INTRAVENOUS | Status: AC
  Administered 2020-01-27: 1.5 mg/kg/h via INTRAVENOUS
  Filled 2020-01-26: qty 25

## 2020-01-26 MED ORDER — DEXMEDETOMIDINE HCL IN NACL 400 MCG/100ML IV SOLN
0.1000 ug/kg/h | INTRAVENOUS | Status: AC
  Administered 2020-01-27: .5 ug/kg/h via INTRAVENOUS
  Filled 2020-01-26: qty 100

## 2020-01-26 MED ORDER — MIDAZOLAM HCL 2 MG/2ML IJ SOLN
INTRAMUSCULAR | Status: AC
  Filled 2020-01-26: qty 2

## 2020-01-26 MED ORDER — SODIUM CHLORIDE 0.9% FLUSH: 3.0000 mL | INTRAVENOUS | Status: DC | PRN

## 2020-01-26 MED ORDER — METOPROLOL TARTRATE 25 MG PO TABS
25.0000 mg | ORAL_TABLET | Freq: Two times a day (BID) | ORAL | Status: DC
  Administered 2020-01-26: 25 mg via ORAL
  Filled 2020-01-26 (×2): qty 1

## 2020-01-26 MED ORDER — EPINEPHRINE HCL 5 MG/250ML IV SOLN IN NS
0.0000 ug/min | INTRAVENOUS | Status: DC
  Filled 2020-01-26: qty 250

## 2020-01-26 MED ORDER — VITAMIN D 1000 UNITS PO TABS
1000.0000 [IU] | ORAL_TABLET | Freq: Every day | ORAL | Status: DC
  Filled 2020-01-26: qty 1

## 2020-01-26 MED ORDER — TRANEXAMIC ACID (OHS) BOLUS VIA INFUSION
15.0000 mg/kg | INTRAVENOUS | Status: AC
  Administered 2020-01-27: 979.5 mg via INTRAVENOUS
  Filled 2020-01-26: qty 980

## 2020-01-26 MED ORDER — HYDRALAZINE HCL 20 MG/ML IJ SOLN
INTRAMUSCULAR | Status: AC
  Filled 2020-01-26: qty 1

## 2020-01-26 MED ORDER — SODIUM CHLORIDE 0.9 % WEIGHT BASED INFUSION
3.0000 mL/kg/h | INTRAVENOUS | Status: DC
  Administered 2020-01-26: 3 mL/kg/h via INTRAVENOUS

## 2020-01-26 MED ORDER — HEPARIN (PORCINE) IN NACL 1000-0.9 UT/500ML-% IV SOLN
INTRAVENOUS | Status: DC | PRN
  Administered 2020-01-26 (×2): 500 mL

## 2020-01-26 MED ORDER — LEVOFLOXACIN IN D5W 500 MG/100ML IV SOLN
500.0000 mg | INTRAVENOUS | Status: AC
  Administered 2020-01-27: 500 mg via INTRAVENOUS
  Filled 2020-01-26: qty 100

## 2020-01-26 MED ORDER — NITROGLYCERIN IN D5W 200-5 MCG/ML-% IV SOLN
2.0000 ug/min | INTRAVENOUS | Status: DC
  Filled 2020-01-26: qty 250

## 2020-01-26 MED ORDER — MAGNESIUM SULFATE 50 % IJ SOLN
40.0000 meq | INTRAMUSCULAR | Status: DC
  Filled 2020-01-26: qty 9.85

## 2020-01-26 MED ORDER — NOREPINEPHRINE 4 MG/250ML-% IV SOLN
0.0000 ug/min | INTRAVENOUS | Status: DC
  Filled 2020-01-26: qty 250

## 2020-01-26 MED ORDER — VERAPAMIL HCL 2.5 MG/ML IV SOLN
INTRAVENOUS | Status: DC | PRN
  Administered 2020-01-26: 10 mL via INTRA_ARTERIAL

## 2020-01-26 MED ORDER — NITROGLYCERIN 0.4 MG SL SUBL: 0.4000 mg | SUBLINGUAL_TABLET | SUBLINGUAL | Status: DC | PRN

## 2020-01-26 MED ORDER — SODIUM CHLORIDE 0.9 % IV SOLN: 250.0000 mL | INTRAVENOUS | Status: DC | PRN

## 2020-01-26 MED ORDER — TRANEXAMIC ACID (OHS) PUMP PRIME SOLUTION
2.0000 mg/kg | INTRAVENOUS | Status: DC
  Filled 2020-01-26: qty 1.31

## 2020-01-26 MED ORDER — TEMAZEPAM 15 MG PO CAPS: 15.0000 mg | ORAL_CAPSULE | Freq: Once | ORAL | Status: DC | PRN

## 2020-01-26 MED ORDER — HEPARIN (PORCINE) 25000 UT/250ML-% IV SOLN
900.0000 [IU]/h | INTRAVENOUS | Status: DC
  Administered 2020-01-26: 900 [IU]/h via INTRAVENOUS
  Filled 2020-01-26: qty 250

## 2020-01-26 MED ORDER — LIDOCAINE HCL (PF) 1 % IJ SOLN
INTRAMUSCULAR | Status: AC
  Filled 2020-01-26: qty 30

## 2020-01-26 MED ORDER — FENTANYL CITRATE (PF) 100 MCG/2ML IJ SOLN
INTRAMUSCULAR | Status: AC
  Filled 2020-01-26: qty 2

## 2020-01-26 MED ORDER — LISINOPRIL 20 MG PO TABS: 20.0000 mg | ORAL_TABLET | Freq: Every day | ORAL | Status: DC

## 2020-01-26 MED ORDER — ASPIRIN 81 MG PO CHEW
81.0000 mg | CHEWABLE_TABLET | Freq: Every day | ORAL | Status: DC
  Administered 2020-01-26: 81 mg via ORAL
  Filled 2020-01-26 (×2): qty 1

## 2020-01-26 MED ORDER — MIDAZOLAM HCL 2 MG/2ML IJ SOLN
INTRAMUSCULAR | Status: DC | PRN
  Administered 2020-01-26: 1 mg via INTRAVENOUS

## 2020-01-26 MED ORDER — LEVOTHYROXINE SODIUM 100 MCG PO TABS
200.0000 ug | ORAL_TABLET | ORAL | Status: DC
  Administered 2020-01-28 – 2020-01-29 (×2): 200 ug via ORAL
  Filled 2020-01-26 (×2): qty 2

## 2020-01-26 MED ORDER — PHENYLEPHRINE HCL-NACL 20-0.9 MG/250ML-% IV SOLN
30.0000 ug/min | INTRAVENOUS | Status: DC
  Filled 2020-01-26: qty 250

## 2020-01-26 MED ORDER — HEPARIN SODIUM (PORCINE) 1000 UNIT/ML IJ SOLN
INTRAMUSCULAR | Status: AC
  Filled 2020-01-26: qty 1

## 2020-01-26 MED ORDER — INSULIN REGULAR(HUMAN) IN NACL 100-0.9 UT/100ML-% IV SOLN
INTRAVENOUS | Status: AC
  Administered 2020-01-27: .7 [IU]/h via INTRAVENOUS
  Filled 2020-01-26: qty 100

## 2020-01-26 MED ORDER — FLUTICASONE PROPIONATE 50 MCG/ACT NA SUSP
2.0000 | Freq: Every day | NASAL | Status: DC
  Administered 2020-01-26: 2 via NASAL
  Filled 2020-01-26: qty 16

## 2020-01-26 MED ORDER — HEPARIN (PORCINE) IN NACL 1000-0.9 UT/500ML-% IV SOLN
INTRAVENOUS | Status: AC
  Filled 2020-01-26: qty 500

## 2020-01-26 MED ORDER — VITAMIN B-12 1000 MCG PO TABS
2500.0000 ug | ORAL_TABLET | Freq: Every day | ORAL | Status: DC
  Administered 2020-01-26: 2500 ug via ORAL
  Filled 2020-01-26: qty 3

## 2020-01-26 MED ORDER — HEPARIN SODIUM (PORCINE) 1000 UNIT/ML IJ SOLN
INTRAMUSCULAR | Status: DC | PRN
  Administered 2020-01-26: 3500 [IU] via INTRAVENOUS

## 2020-01-26 MED ORDER — CHLORHEXIDINE GLUCONATE CLOTH 2 % EX PADS
6.0000 | MEDICATED_PAD | Freq: Once | CUTANEOUS | Status: AC
  Administered 2020-01-26: 6 via TOPICAL

## 2020-01-26 MED ORDER — BISACODYL 5 MG PO TBEC: 5.0000 mg | DELAYED_RELEASE_TABLET | Freq: Once | ORAL | Status: DC

## 2020-01-26 MED ORDER — LEVOTHYROXINE SODIUM 100 MCG PO TABS: 100.0000 ug | ORAL_TABLET | ORAL | Status: DC

## 2020-01-26 MED ORDER — VERAPAMIL HCL 2.5 MG/ML IV SOLN
INTRAVENOUS | Status: AC
  Filled 2020-01-26: qty 2

## 2020-01-26 MED ORDER — PLASMA-LYTE 148 IV SOLN
INTRAVENOUS | Status: DC
  Filled 2020-01-26: qty 2.5

## 2020-01-26 MED ORDER — PRENATAL MULTIVITAMIN CH
1.0000 | ORAL_TABLET | Freq: Every day | ORAL | Status: DC
  Filled 2020-01-26: qty 1

## 2020-01-26 MED ORDER — ASPIRIN EC 81 MG PO TBEC: 81.0000 mg | DELAYED_RELEASE_TABLET | Freq: Every day | ORAL | Status: DC

## 2020-01-26 MED ORDER — SODIUM CHLORIDE 0.9 % IV SOLN: INTRAVENOUS | Status: AC

## 2020-01-26 MED ORDER — IOHEXOL 350 MG/ML SOLN
INTRAVENOUS | Status: DC | PRN
  Administered 2020-01-26: 55 mL via INTRA_ARTERIAL

## 2020-01-26 MED ORDER — ACETAMINOPHEN 325 MG PO TABS: 650.0000 mg | ORAL_TABLET | ORAL | Status: DC | PRN

## 2020-01-26 MED ORDER — POTASSIUM CHLORIDE 2 MEQ/ML IV SOLN
80.0000 meq | INTRAVENOUS | Status: DC
  Filled 2020-01-26: qty 40

## 2020-01-26 MED ORDER — CHLORHEXIDINE GLUCONATE 0.12 % MT SOLN
15.0000 mL | Freq: Once | OROMUCOSAL | Status: AC
  Administered 2020-01-27: 15 mL via OROMUCOSAL
  Filled 2020-01-26: qty 15

## 2020-01-26 MED ORDER — SODIUM CHLORIDE 0.9% FLUSH: 3.0000 mL | Freq: Two times a day (BID) | INTRAVENOUS | Status: DC

## 2020-01-26 MED ORDER — LABETALOL HCL 5 MG/ML IV SOLN: 10.0000 mg | INTRAVENOUS | Status: AC | PRN

## 2020-01-26 MED ORDER — MILRINONE LACTATE IN DEXTROSE 20-5 MG/100ML-% IV SOLN
0.3000 ug/kg/min | INTRAVENOUS | Status: DC
  Filled 2020-01-26: qty 100

## 2020-01-26 MED ORDER — LEVOTHYROXINE SODIUM 100 MCG PO TABS
100.0000 ug | ORAL_TABLET | ORAL | Status: DC
  Administered 2020-01-27: 100 ug via ORAL
  Filled 2020-01-26: qty 1

## 2020-01-26 MED ORDER — CHLORHEXIDINE GLUCONATE CLOTH 2 % EX PADS: 6.0000 | MEDICATED_PAD | Freq: Once | CUTANEOUS | Status: AC

## 2020-01-26 MED ORDER — FENTANYL CITRATE (PF) 100 MCG/2ML IJ SOLN
INTRAMUSCULAR | Status: DC | PRN
  Administered 2020-01-26: 25 ug via INTRAVENOUS

## 2020-01-26 MED ORDER — LIDOCAINE HCL (PF) 1 % IJ SOLN
INTRAMUSCULAR | Status: DC | PRN
  Administered 2020-01-26: 2 mL via INTRADERMAL

## 2020-01-26 MED ORDER — ATORVASTATIN CALCIUM 80 MG PO TABS
80.0000 mg | ORAL_TABLET | Freq: Every day | ORAL | Status: DC
  Administered 2020-01-27 – 2020-01-29 (×3): 80 mg via ORAL
  Filled 2020-01-26 (×3): qty 1

## 2020-01-26 MED ORDER — ONDANSETRON HCL 4 MG/2ML IJ SOLN: 4.0000 mg | Freq: Four times a day (QID) | INTRAMUSCULAR | Status: DC | PRN

## 2020-01-26 MED ORDER — ASPIRIN 81 MG PO CHEW: 81.0000 mg | CHEWABLE_TABLET | ORAL | Status: DC

## 2020-01-26 MED ORDER — SODIUM CHLORIDE 0.9% FLUSH
3.0000 mL | Freq: Two times a day (BID) | INTRAVENOUS | Status: DC
  Administered 2020-01-26: 3 mL via INTRAVENOUS

## 2020-01-26 MED ORDER — VANCOMYCIN HCL 1250 MG/250ML IV SOLN
1250.0000 mg | INTRAVENOUS | Status: AC
  Administered 2020-01-27: 1250 mg via INTRAVENOUS
  Filled 2020-01-26: qty 250

## 2020-01-26 MED ORDER — METOPROLOL TARTRATE 12.5 MG HALF TABLET
12.5000 mg | ORAL_TABLET | Freq: Once | ORAL | Status: AC
  Administered 2020-01-27: 12.5 mg via ORAL
  Filled 2020-01-26: qty 1

## 2020-01-26 MED ORDER — HYDRALAZINE HCL 20 MG/ML IJ SOLN
10.0000 mg | INTRAMUSCULAR | Status: AC | PRN
  Administered 2020-01-26 (×2): 10 mg via INTRAVENOUS

## 2020-01-26 MED ORDER — SODIUM CHLORIDE 0.9 % IV SOLN
INTRAVENOUS | Status: DC
  Filled 2020-01-26: qty 30

## 2020-01-26 MED ORDER — SODIUM CHLORIDE 0.9 % WEIGHT BASED INFUSION: 1.0000 mL/kg/h | INTRAVENOUS | Status: DC

## 2020-01-26 SURGICAL SUPPLY — 11 items
CATH 5FR JL3.5 JR4 ANG PIG MP (CATHETERS) ×1 IMPLANT
DEVICE RAD COMP TR BAND LRG (VASCULAR PRODUCTS) ×1 IMPLANT
GLIDESHEATH SLEND A-KIT 6F 22G (SHEATH) ×1 IMPLANT
GUIDEWIRE INQWIRE 1.5J.035X260 (WIRE) IMPLANT
INQWIRE 1.5J .035X260CM (WIRE) ×2
KIT HEART LEFT (KITS) ×2 IMPLANT
PACK CARDIAC CATHETERIZATION (CUSTOM PROCEDURE TRAY) ×2 IMPLANT
SHEATH PROBE COVER 6X72 (BAG) ×1 IMPLANT
TRANSDUCER W/STOPCOCK (MISCELLANEOUS) ×2 IMPLANT
TUBING CIL FLEX 10 FLL-RA (TUBING) ×2 IMPLANT
WIRE HI TORQ VERSACORE-J 145CM (WIRE) ×1 IMPLANT

## 2020-01-26 NOTE — Interval H&P Note (Signed)
History and Physical Interval Note:  01/26/2020 1:15 PMCath Lab Visit (complete for each Cath Lab visit)  Clinical Evaluation Leading to the Procedure:   ACS: No.  Non-ACS:    Anginal Classification: CCS II  Anti-ischemic medical therapy: Maximal Therapy (2 or more classes of medications)  Non-Invasive Test Results: High-risk stress test findings: cardiac mortality >3%/year  Prior CABG: No previous CABG        [name] [name]  has presented today for surgery, with the diagnosis of cad.  The various methods of treatment have been discussed with the patient and family. After consideration of risks, benefits and other options for treatment, the patient has consented to  Procedure(s): LEFT HEART CATH AND CORONARY ANGIOGRAPHY (N/A) as a surgical intervention.  The patient's history has been reviewed, patient examined, no change in status, stable for surgery.  I have reviewed the patient's chart and labs.  Questions were answered to the patient's satisfaction.     Belva Crome III

## 2020-01-26 NOTE — CV Procedure (Signed)
   Left heart cath with coronary angiography via right radial using real-time vascular ultrasound for access.  70% ostial proximal left main  80% mid LAD  80% proximal to mid circumflex  Occluded mid small RCA receiving collaterals from LAD at the apex  Normal left ventricular systolic function.  EF 60%.  LVEDP is 18 mmHg.  Calcified mitral annulus

## 2020-01-26 NOTE — Progress Notes (Signed)
ANTICOAGULATION CONSULT NOTE - Initial Consult  Pharmacy Consult for heparin Indication: chest pain/ACS  Allergies  Allergen Reactions  . Bee Venom Swelling    Massive swelling  . Penicillins Other (See Comments)    Did it involve swelling of the face/tongue/throat, SOB, or low BP? Unknown Did it involve sudden or severe rash/hives, skin peeling, or any reaction on the inside of your mouth or nose? Unknown Did you need to seek medical attention at a hospital or doctor's office? Unknown When did it last happen?1945 If all above answers are "NO", may proceed with cephalosporin use.  . Adhesive [Tape] Rash  . Morphine And Related Rash  . Sulfa Antibiotics Nausea And Vomiting and Rash    Patient Measurements: Height: 5\' 3"  (160 cm) Weight: 144 lb (65.3 kg) IBW/kg (Calculated) : 52.4 Heparin Dosing Weight: 65kg  Vital Signs: Temp: 97.6 F (36.4 C) (03/11 0958) Temp Source: Skin (03/11 0958) BP: 179/77 (03/11 1440) Pulse Rate: 58 (03/11 1440)  Labs: Recent Labs    01/24/20 1039  HGB 11.7  HCT 34.9  PLT 238    Estimated Creatinine Clearance: 51.8 mL/min (by C-G formula based on SCr of 0.84 mg/dL).   Medical History: Past Medical History:  Diagnosis Date  . HTN (hypertension)   . Hyperlipidemia   . Hypothyroidism   . Osteoporosis   . Thyroid carcinoma (H[facility])    Assessment: 59 yof s/p cath found to have MV CAD this afternoon. Orders to start heparin tonight post cath, follow up surgical recommendations. CBC within normal limits. She does not appear to be on anticoagulation prior to admit.   Goal of Therapy:  Heparin level 0.3-0.7 units/ml Monitor platelets by anticoagulation protocol: Yes   Plan:  Start heparin infusion at 900 units/hr Check anti-Xa level in 8 hours and daily while on heparin Continue to monitor H&H and platelets  Erin Hearing PharmD., BCPS Clinical Pharmacist 01/26/2020 2:52 PM

## 2020-01-26 NOTE — Progress Notes (Signed)
Pre-CABG testing has been completed. Preliminary results can be found in CV Proc through chart review.   01/26/20 4:59 PM [name] [name] RVT

## 2020-01-27 ENCOUNTER — Inpatient Hospital Stay (HOSPITAL_COMMUNITY): Payer: PPO | Admitting: Certified Registered Nurse Anesthetist

## 2020-01-27 ENCOUNTER — Inpatient Hospital Stay (HOSPITAL_COMMUNITY): Payer: PPO

## 2020-01-27 ENCOUNTER — Encounter (HOSPITAL_COMMUNITY)
Admission: RE | Disposition: A | Discharge status: Skilled Nursing Facility | Payer: Self-pay | Source: Home / Self Care | Attending: Cardiothoracic Surgery

## 2020-01-27 DIAGNOSIS — I10 Essential (primary) hypertension: Secondary | ICD-10-CM

## 2020-01-27 DIAGNOSIS — I25110 Atherosclerotic heart disease of native coronary artery with unstable angina pectoris: Secondary | ICD-10-CM

## 2020-01-27 DIAGNOSIS — Z951 Presence of aortocoronary bypass graft: Secondary | ICD-10-CM

## 2020-01-27 DIAGNOSIS — I2510 Atherosclerotic heart disease of native coronary artery without angina pectoris: Secondary | ICD-10-CM

## 2020-01-27 HISTORY — PX: CORONARY ARTERY BYPASS GRAFT: SHX141

## 2020-01-27 HISTORY — PX: TEE WITHOUT CARDIOVERSION: SHX5443

## 2020-01-27 HISTORY — PX: RADIAL ARTERY HARVEST: SHX5067

## 2020-01-27 LAB — URINALYSIS, ROUTINE W REFLEX MICROSCOPIC
Bilirubin Urine: NEGATIVE
Glucose, UA: NEGATIVE mg/dL
Hgb urine dipstick: NEGATIVE
Ketones, ur: NEGATIVE mg/dL
Nitrite: POSITIVE — AB
Protein, ur: NEGATIVE mg/dL
Specific Gravity, Urine: 1.019 (ref 1.005–1.030)
WBC, UA: >50 WBC/hpf — ABNORMAL HIGH (ref 0–5)
pH: 6 (ref 5.0–8.0)

## 2020-01-27 LAB — POCT I-STAT 7, (LYTES, BLD GAS, ICA,H+H)
Acid-base deficit: 3 mmol/L — ABNORMAL HIGH (ref 0.0–2.0)
Acid-base deficit: 1 mmol/L (ref 0.0–2.0)
Acid-base deficit: 2 mmol/L (ref 0.0–2.0)
Bicarbonate: 23.1 mmol/L (ref 20.0–28.0)
Bicarbonate: 22.4 mmol/L (ref 20.0–28.0)
Bicarbonate: 22.7 mmol/L (ref 20.0–28.0)
Calcium, Ion: 1.42 mmol/L — ABNORMAL HIGH (ref 1.15–1.40)
Calcium, Ion: 1.15 mmol/L (ref 1.15–1.40)
Calcium, Ion: 1.14 mmol/L — ABNORMAL LOW (ref 1.15–1.40)
HCT: 30 % — ABNORMAL LOW (ref 36.0–46.0)
HCT: 21 % — ABNORMAL LOW (ref 36.0–46.0)
HCT: 20 % — ABNORMAL LOW (ref 36.0–46.0)
Hemoglobin: 10.2 g/dL — ABNORMAL LOW (ref 12.0–15.0)
Hemoglobin: 7.1 g/dL — ABNORMAL LOW (ref 12.0–15.0)
Hemoglobin: 6.8 g/dL — CL (ref 12.0–15.0)
O2 Saturation: 100 %
O2 Saturation: 100 %
O2 Saturation: 100 %
Potassium: 3.5 mmol/L (ref 3.5–5.1)
Potassium: 4.5 mmol/L (ref 3.5–5.1)
Potassium: 4.5 mmol/L (ref 3.5–5.1)
Sodium: 141 mmol/L (ref 135–145)
Sodium: 137 mmol/L (ref 135–145)
Sodium: 135 mmol/L (ref 135–145)
TCO2: 24 mmol/L (ref 22–32)
TCO2: 23 mmol/L (ref 22–32)
TCO2: 24 mmol/L (ref 22–32)
pCO2 arterial: 43.2 mmHg (ref 32.0–48.0)
pCO2 arterial: 29.9 mmHg — ABNORMAL LOW (ref 32.0–48.0)
pCO2 arterial: 34.4 mmHg (ref 32.0–48.0)
pH, Arterial: 7.336 — ABNORMAL LOW (ref 7.350–7.450)
pH, Arterial: 7.482 — ABNORMAL HIGH (ref 7.350–7.450)
pH, Arterial: 7.428 (ref 7.350–7.450)
pO2, Arterial: 431 mmHg — ABNORMAL HIGH (ref 83.0–108.0)
pO2, Arterial: 341 mmHg — ABNORMAL HIGH (ref 83.0–108.0)
pO2, Arterial: 409 mmHg — ABNORMAL HIGH (ref 83.0–108.0)

## 2020-01-27 LAB — POCT I-STAT, CHEM 8
BUN: 13 mg/dL (ref 8–23)
BUN: 13 mg/dL (ref 8–23)
BUN: 11 mg/dL (ref 8–23)
BUN: 12 mg/dL (ref 8–23)
BUN: 13 mg/dL (ref 8–23)
Calcium, Ion: 1.49 mmol/L — ABNORMAL HIGH (ref 1.15–1.40)
Calcium, Ion: 1.35 mmol/L (ref 1.15–1.40)
Calcium, Ion: 1.05 mmol/L — ABNORMAL LOW (ref 1.15–1.40)
Calcium, Ion: 1.14 mmol/L — ABNORMAL LOW (ref 1.15–1.40)
Calcium, Ion: 1.11 mmol/L — ABNORMAL LOW (ref 1.15–1.40)
Chloride: 107 mmol/L (ref 98–111)
Chloride: 107 mmol/L (ref 98–111)
Chloride: 100 mmol/L (ref 98–111)
Chloride: 104 mmol/L (ref 98–111)
Chloride: 103 mmol/L (ref 98–111)
Creatinine, Ser: 0.7 mg/dL (ref 0.44–1.00)
Creatinine, Ser: 0.7 mg/dL (ref 0.44–1.00)
Creatinine, Ser: 0.5 mg/dL (ref 0.44–1.00)
Creatinine, Ser: 0.6 mg/dL (ref 0.44–1.00)
Creatinine, Ser: 0.5 mg/dL (ref 0.44–1.00)
Glucose, Bld: 91 mg/dL (ref 70–99)
Glucose, Bld: 104 mg/dL — ABNORMAL HIGH (ref 70–99)
Glucose, Bld: 91 mg/dL (ref 70–99)
Glucose, Bld: 120 mg/dL — ABNORMAL HIGH (ref 70–99)
Glucose, Bld: 106 mg/dL — ABNORMAL HIGH (ref 70–99)
HCT: 32 % — ABNORMAL LOW (ref 36.0–46.0)
HCT: 27 % — ABNORMAL LOW (ref 36.0–46.0)
HCT: 23 % — ABNORMAL LOW (ref 36.0–46.0)
HCT: 20 % — ABNORMAL LOW (ref 36.0–46.0)
HCT: 21 % — ABNORMAL LOW (ref 36.0–46.0)
Hemoglobin: 10.9 g/dL — ABNORMAL LOW (ref 12.0–15.0)
Hemoglobin: 9.2 g/dL — ABNORMAL LOW (ref 12.0–15.0)
Hemoglobin: 7.8 g/dL — ABNORMAL LOW (ref 12.0–15.0)
Hemoglobin: 6.8 g/dL — CL (ref 12.0–15.0)
Hemoglobin: 7.1 g/dL — ABNORMAL LOW (ref 12.0–15.0)
Potassium: 3.5 mmol/L (ref 3.5–5.1)
Potassium: 3.7 mmol/L (ref 3.5–5.1)
Potassium: 3.7 mmol/L (ref 3.5–5.1)
Potassium: 5.2 mmol/L — ABNORMAL HIGH (ref 3.5–5.1)
Potassium: 4.4 mmol/L (ref 3.5–5.1)
Sodium: 142 mmol/L (ref 135–145)
Sodium: 137 mmol/L (ref 135–145)
Sodium: 135 mmol/L (ref 135–145)
Sodium: 135 mmol/L (ref 135–145)
Sodium: 136 mmol/L (ref 135–145)
TCO2: 23 mmol/L (ref 22–32)
TCO2: 22 mmol/L (ref 22–32)
TCO2: 21 mmol/L — ABNORMAL LOW (ref 22–32)
TCO2: 25 mmol/L (ref 22–32)
TCO2: 24 mmol/L (ref 22–32)

## 2020-01-27 LAB — CBC
HCT: 38 % (ref 36.0–46.0)
HCT: 33.6 % — ABNORMAL LOW (ref 36.0–46.0)
Hemoglobin: 12.5 g/dL (ref 12.0–15.0)
Hemoglobin: 11 g/dL — ABNORMAL LOW (ref 12.0–15.0)
MCH: 29.6 pg (ref 26.0–34.0)
MCH: 29.8 pg (ref 26.0–34.0)
MCHC: 32.9 g/dL (ref 30.0–36.0)
MCHC: 32.7 g/dL (ref 30.0–36.0)
MCV: 89.8 fL (ref 80.0–100.0)
MCV: 91.1 fL (ref 80.0–100.0)
Platelets: 230 10*3/uL (ref 150–400)
Platelets: 137 10*3/uL — ABNORMAL LOW (ref 150–400)
RBC: 4.23 MIL/uL (ref 3.87–5.11)
RBC: 3.69 MIL/uL — ABNORMAL LOW (ref 3.87–5.11)
RDW: 13.9 % (ref 11.5–15.5)
RDW: 13.5 % (ref 11.5–15.5)
WBC: 7.8 10*3/uL (ref 4.0–10.5)
WBC: 10.8 10*3/uL — ABNORMAL HIGH (ref 4.0–10.5)
nRBC: 0 % (ref 0.0–0.2)
nRBC: 0 % (ref 0.0–0.2)

## 2020-01-27 LAB — BLOOD GAS, ARTERIAL
Acid-base deficit: 2.6 mmol/L — ABNORMAL HIGH (ref 0.0–2.0)
Bicarbonate: 21.4 mmol/L (ref 20.0–28.0)
FIO2: 21
O2 Saturation: 97.4 %
Patient temperature: 37
pCO2 arterial: 35.2 mmHg (ref 32.0–48.0)
pH, Arterial: 7.401 (ref 7.350–7.450)
pO2, Arterial: 110 mmHg — ABNORMAL HIGH (ref 83.0–108.0)

## 2020-01-27 LAB — BASIC METABOLIC PANEL
Anion gap: 12 (ref 5–15)
BUN: 14 mg/dL (ref 8–23)
CO2: 21 mmol/L — ABNORMAL LOW (ref 22–32)
Calcium: 10.1 mg/dL (ref 8.9–10.3)
Chloride: 105 mmol/L (ref 98–111)
Creatinine, Ser: 0.86 mg/dL (ref 0.44–1.00)
GFR calc Af Amer: >60 mL/min (ref >60)
GFR calc non Af Amer: >60 mL/min (ref >60)
Glucose, Bld: 98 mg/dL (ref 70–99)
Potassium: 3.8 mmol/L (ref 3.5–5.1)
Sodium: 138 mmol/L (ref 135–145)

## 2020-01-27 LAB — HEMOGLOBIN AND HEMATOCRIT, BLOOD
HCT: 21.3 % — ABNORMAL LOW (ref 36.0–46.0)
Hemoglobin: 6.9 g/dL — CL (ref 12.0–15.0)

## 2020-01-27 LAB — HEPARIN LEVEL (UNFRACTIONATED): Heparin Unfractionated: 0.67 IU/mL (ref 0.30–0.70)

## 2020-01-27 LAB — HEMOGLOBIN A1C
Hgb A1c MFr Bld: 5.1 % (ref 4.8–5.6)
Mean Plasma Glucose: 99.67 mg/dL

## 2020-01-27 LAB — GLUCOSE, CAPILLARY
Glucose-Capillary: 126 mg/dL — ABNORMAL HIGH (ref 70–99)
Glucose-Capillary: 141 mg/dL — ABNORMAL HIGH (ref 70–99)
Glucose-Capillary: 141 mg/dL — ABNORMAL HIGH (ref 70–99)

## 2020-01-27 LAB — PROTIME-INR
INR: 1 (ref 0.8–1.2)
INR: 1.5 — ABNORMAL HIGH (ref 0.8–1.2)
Prothrombin Time: 12.9 seconds (ref 11.4–15.2)
Prothrombin Time: 17.6 seconds — ABNORMAL HIGH (ref 11.4–15.2)

## 2020-01-27 LAB — APTT
aPTT: 76 seconds — ABNORMAL HIGH (ref 24–36)
aPTT: 33 seconds (ref 24–36)

## 2020-01-27 LAB — PREPARE RBC (CROSSMATCH)

## 2020-01-27 LAB — PLATELET COUNT: Platelets: 138 10*3/uL — ABNORMAL LOW (ref 150–400)

## 2020-01-27 LAB — FIBRINOGEN: Fibrinogen: 165 mg/dL — ABNORMAL LOW (ref 210–475)

## 2020-01-27 SURGERY — CORONARY ARTERY BYPASS GRAFTING (CABG)
Anesthesia: General | Site: Chest

## 2020-01-27 MED ORDER — FENTANYL CITRATE (PF) 100 MCG/2ML IJ SOLN
INTRAMUSCULAR | Status: AC
  Filled 2020-01-27: qty 2

## 2020-01-27 MED ORDER — PROPOFOL 10 MG/ML IV BOLUS
INTRAVENOUS | Status: AC
  Filled 2020-01-27: qty 20

## 2020-01-27 MED ORDER — NON FORMULARY
Status: DC | PRN
  Administered 2020-01-27: 25 mg via INTRAMUSCULAR

## 2020-01-27 MED ORDER — INSULIN REGULAR(HUMAN) IN NACL 100-0.9 UT/100ML-% IV SOLN: INTRAVENOUS | Status: DC

## 2020-01-27 MED ORDER — PHENYLEPHRINE 40 MCG/ML (10ML) SYRINGE FOR IV PUSH (FOR BLOOD PRESSURE SUPPORT)
PREFILLED_SYRINGE | INTRAVENOUS | Status: AC
  Filled 2020-01-27: qty 10

## 2020-01-27 MED ORDER — PROTAMINE SULFATE 10 MG/ML IV SOLN
INTRAVENOUS | Status: AC
  Filled 2020-01-27: qty 25

## 2020-01-27 MED ORDER — CHLORHEXIDINE GLUCONATE 0.12 % MT SOLN
15.0000 mL | OROMUCOSAL | Status: AC
  Administered 2020-01-28: 15 mL via OROMUCOSAL

## 2020-01-27 MED ORDER — ASPIRIN 81 MG PO CHEW: 324.0000 mg | CHEWABLE_TABLET | Freq: Every day | ORAL | Status: DC

## 2020-01-27 MED ORDER — HEPARIN SODIUM (PORCINE) 1000 UNIT/ML IJ SOLN
INTRAMUSCULAR | Status: DC | PRN
  Administered 2020-01-27: 21000 [IU] via INTRAVENOUS

## 2020-01-27 MED ORDER — PLASMA-LYTE 148 IV SOLN
INTRAVENOUS | Status: DC | PRN
  Administered 2020-01-27: 500 mL via INTRAVASCULAR

## 2020-01-27 MED ORDER — DEXMEDETOMIDINE HCL IN NACL 400 MCG/100ML IV SOLN
0.0000 ug/kg/h | INTRAVENOUS | Status: DC
  Administered 2020-01-28 (×2): 0.7 ug/kg/h via INTRAVENOUS
  Filled 2020-01-27 (×2): qty 100

## 2020-01-27 MED ORDER — MIDAZOLAM HCL 5 MG/5ML IJ SOLN
INTRAMUSCULAR | Status: DC | PRN
  Administered 2020-01-27: 2 mg via INTRAVENOUS
  Administered 2020-01-27: 4 mg via INTRAVENOUS
  Administered 2020-01-27: 2 mg via INTRAVENOUS

## 2020-01-27 MED ORDER — METOPROLOL TARTRATE 25 MG PO TABS
12.5000 mg | ORAL_TABLET | Freq: Two times a day (BID) | ORAL | Status: DC
  Administered 2020-01-28 – 2020-01-29 (×3): 12.5 mg via ORAL
  Filled 2020-01-27 (×3): qty 1

## 2020-01-27 MED ORDER — SODIUM CHLORIDE 0.9% FLUSH
3.0000 mL | Freq: Two times a day (BID) | INTRAVENOUS | Status: DC
  Administered 2020-01-29 – 2020-02-08 (×15): 3 mL via INTRAVENOUS

## 2020-01-27 MED ORDER — SODIUM CHLORIDE 0.9% IV SOLUTION: Freq: Once | INTRAVENOUS | Status: DC

## 2020-01-27 MED ORDER — BUPIVACAINE LIPOSOME 1.3 % IJ SUSP
20.0000 mL | INTRAMUSCULAR | Status: AC
  Administered 2020-01-27: 20 mL
  Filled 2020-01-27: qty 20

## 2020-01-27 MED ORDER — LACTATED RINGERS IV SOLN: INTRAVENOUS | Status: DC | PRN

## 2020-01-27 MED ORDER — BISACODYL 5 MG PO TBEC
10.0000 mg | DELAYED_RELEASE_TABLET | Freq: Every day | ORAL | Status: DC
  Administered 2020-01-29: 10 mg via ORAL
  Filled 2020-01-27: qty 2

## 2020-01-27 MED ORDER — LACTATED RINGERS IV SOLN: INTRAVENOUS | Status: DC

## 2020-01-27 MED ORDER — DOCUSATE SODIUM 100 MG PO CAPS
200.0000 mg | ORAL_CAPSULE | Freq: Every day | ORAL | Status: DC
  Administered 2020-01-29: 200 mg via ORAL
  Filled 2020-01-27: qty 2

## 2020-01-27 MED ORDER — SODIUM BICARBONATE 8.4 % IV SOLN
100.0000 meq | Freq: Once | INTRAVENOUS | Status: AC
  Administered 2020-01-27: 100 meq via INTRAVENOUS

## 2020-01-27 MED ORDER — SODIUM CHLORIDE 0.9 % IV SOLN
20.0000 ug | Freq: Once | INTRAVENOUS | Status: AC
  Administered 2020-01-27: 20 ug via INTRAVENOUS
  Filled 2020-01-27: qty 5

## 2020-01-27 MED ORDER — PROTAMINE SULFATE 10 MG/ML IV SOLN
50.0000 mg | Freq: Once | INTRAVENOUS | Status: AC
  Administered 2020-01-27: 50 mg via INTRAVENOUS
  Filled 2020-01-27: qty 5

## 2020-01-27 MED ORDER — ONDANSETRON HCL 4 MG/2ML IJ SOLN
4.0000 mg | Freq: Four times a day (QID) | INTRAMUSCULAR | Status: DC | PRN
  Administered 2020-01-28 – 2020-01-29 (×4): 4 mg via INTRAVENOUS
  Filled 2020-01-27 (×4): qty 2

## 2020-01-27 MED ORDER — FENTANYL CITRATE (PF) 100 MCG/2ML IJ SOLN
INTRAMUSCULAR | Status: DC | PRN
  Administered 2020-01-27: 150 ug via INTRAVENOUS
  Administered 2020-01-27: 50 ug via INTRAVENOUS
  Administered 2020-01-27 (×2): 250 ug via INTRAVENOUS
  Administered 2020-01-27: 50 ug via INTRAVENOUS
  Administered 2020-01-27: 100 ug via INTRAVENOUS
  Administered 2020-01-27: 150 ug via INTRAVENOUS
  Administered 2020-01-27: 250 ug via INTRAVENOUS

## 2020-01-27 MED ORDER — SODIUM CHLORIDE 0.45 % IV SOLN: INTRAVENOUS | Status: DC | PRN

## 2020-01-27 MED ORDER — ALBUMIN HUMAN 5 % IV SOLN: INTRAVENOUS | Status: DC | PRN

## 2020-01-27 MED ORDER — FENTANYL CITRATE (PF) 250 MCG/5ML IJ SOLN
INTRAMUSCULAR | Status: AC
  Filled 2020-01-27: qty 5

## 2020-01-27 MED ORDER — VANCOMYCIN HCL 1000 MG IV SOLR
INTRAVENOUS | Status: DC | PRN
  Administered 2020-01-27: 3 g via TOPICAL

## 2020-01-27 MED ORDER — VANCOMYCIN HCL 1000 MG IV SOLR
INTRAVENOUS | Status: AC
  Filled 2020-01-27: qty 3000

## 2020-01-27 MED ORDER — SODIUM CHLORIDE (PF) 0.9 % IJ SOLN
OROMUCOSAL | Status: DC | PRN
  Administered 2020-01-27 (×2): 4 mL via TOPICAL

## 2020-01-27 MED ORDER — ASPIRIN EC 325 MG PO TBEC: 325.0000 mg | DELAYED_RELEASE_TABLET | Freq: Every day | ORAL | Status: DC

## 2020-01-27 MED ORDER — ACETAMINOPHEN 650 MG RE SUPP: 650.0000 mg | Freq: Once | RECTAL | Status: DC

## 2020-01-27 MED ORDER — ACETAMINOPHEN 160 MG/5ML PO SOLN: 650.0000 mg | Freq: Once | ORAL | Status: DC

## 2020-01-27 MED ORDER — MIDAZOLAM HCL 2 MG/2ML IJ SOLN
2.0000 mg | INTRAMUSCULAR | Status: DC | PRN
  Administered 2020-01-28: 2 mg via INTRAVENOUS
  Filled 2020-01-27: qty 2

## 2020-01-27 MED ORDER — 0.9 % SODIUM CHLORIDE (POUR BTL) OPTIME
TOPICAL | Status: DC | PRN
  Administered 2020-01-27: 5000 mL

## 2020-01-27 MED ORDER — MIDAZOLAM HCL (PF) 10 MG/2ML IJ SOLN
INTRAMUSCULAR | Status: AC
  Filled 2020-01-27: qty 2

## 2020-01-27 MED ORDER — METOPROLOL TARTRATE 25 MG/10 ML ORAL SUSPENSION
12.5000 mg | Freq: Two times a day (BID) | ORAL | Status: DC
  Administered 2020-01-28: 12.5 mg
  Filled 2020-01-27: qty 5

## 2020-01-27 MED ORDER — PANTOPRAZOLE SODIUM 40 MG PO TBEC
40.0000 mg | DELAYED_RELEASE_TABLET | Freq: Every day | ORAL | Status: DC
  Administered 2020-01-29: 40 mg via ORAL
  Filled 2020-01-27: qty 1

## 2020-01-27 MED ORDER — ROCURONIUM BROMIDE 100 MG/10ML IV SOLN
INTRAVENOUS | Status: DC | PRN
  Administered 2020-01-27 (×2): 50 mg via INTRAVENOUS

## 2020-01-27 MED ORDER — BUPIVACAINE HCL (PF) 0.5 % IJ SOLN
INTRAMUSCULAR | Status: DC | PRN
  Administered 2020-01-27: 30 mL

## 2020-01-27 MED ORDER — OXYCODONE HCL 5 MG PO TABS
5.0000 mg | ORAL_TABLET | ORAL | Status: DC | PRN
  Filled 2020-01-27: qty 2

## 2020-01-27 MED ORDER — ALBUMIN HUMAN 5 % IV SOLN
250.0000 mL | INTRAVENOUS | Status: AC | PRN
  Administered 2020-01-27 (×3): 12.5 g via INTRAVENOUS

## 2020-01-27 MED ORDER — LIDOCAINE HCL (CARDIAC) PF 100 MG/5ML IV SOSY
PREFILLED_SYRINGE | INTRAVENOUS | Status: DC | PRN
  Administered 2020-01-27: 50 mg via INTRAVENOUS

## 2020-01-27 MED ORDER — PROTAMINE SULFATE 10 MG/ML IV SOLN
INTRAVENOUS | Status: DC | PRN
  Administered 2020-01-27: 10 mg via INTRAVENOUS
  Administered 2020-01-27: 190 mg via INTRAVENOUS

## 2020-01-27 MED ORDER — TRAMADOL HCL 50 MG PO TABS
50.0000 mg | ORAL_TABLET | ORAL | Status: DC | PRN
  Administered 2020-01-28 – 2020-02-01 (×3): 100 mg via ORAL
  Administered 2020-02-03: 50 mg via ORAL
  Administered 2020-02-04 – 2020-02-05 (×2): 100 mg via ORAL
  Filled 2020-01-27 (×2): qty 2
  Filled 2020-01-27: qty 1
  Filled 2020-01-27 (×3): qty 2

## 2020-01-27 MED ORDER — DEXTROSE 50 % IV SOLN: 0.0000 mL | INTRAVENOUS | Status: DC | PRN

## 2020-01-27 MED ORDER — MIDAZOLAM HCL 2 MG/2ML IJ SOLN
INTRAMUSCULAR | Status: AC
  Filled 2020-01-27: qty 2

## 2020-01-27 MED ORDER — LIDOCAINE 2% (20 MG/ML) 5 ML SYRINGE
INTRAMUSCULAR | Status: AC
  Filled 2020-01-27: qty 5

## 2020-01-27 MED ORDER — SODIUM CHLORIDE 0.9 % IV SOLN: 250.0000 mL | INTRAVENOUS | Status: DC

## 2020-01-27 MED ORDER — LACTATED RINGERS IV SOLN: 500.0000 mL | Freq: Once | INTRAVENOUS | Status: DC | PRN

## 2020-01-27 MED ORDER — STERILE WATER FOR INJECTION IJ SOLN
INTRAMUSCULAR | Status: AC
  Filled 2020-01-27: qty 10

## 2020-01-27 MED ORDER — NITROGLYCERIN IN D5W 200-5 MCG/ML-% IV SOLN: 7.0000 ug/min | INTRAVENOUS | Status: DC

## 2020-01-27 MED ORDER — HEPARIN SODIUM (PORCINE) 1000 UNIT/ML IJ SOLN
INTRAMUSCULAR | Status: AC
  Filled 2020-01-27: qty 1

## 2020-01-27 MED ORDER — PHENYLEPHRINE 40 MCG/ML (10ML) SYRINGE FOR IV PUSH (FOR BLOOD PRESSURE SUPPORT)
PREFILLED_SYRINGE | INTRAVENOUS | Status: AC
  Filled 2020-01-27: qty 20

## 2020-01-27 MED ORDER — BISACODYL 10 MG RE SUPP
10.0000 mg | Freq: Every day | RECTAL | Status: DC
  Administered 2020-01-31: 10 mg via RECTAL
  Filled 2020-01-27 (×4): qty 1

## 2020-01-27 MED ORDER — MAGNESIUM SULFATE 4 GM/100ML IV SOLN
4.0000 g | Freq: Once | INTRAVENOUS | Status: AC
  Administered 2020-01-28: 4 g via INTRAVENOUS

## 2020-01-27 MED ORDER — SODIUM CHLORIDE 0.9 % IV SOLN: INTRAVENOUS | Status: DC

## 2020-01-27 MED ORDER — PROPOFOL 10 MG/ML IV BOLUS
INTRAVENOUS | Status: DC | PRN
  Administered 2020-01-27: 60 mg via INTRAVENOUS
  Administered 2020-01-27: 50 mg via INTRAVENOUS

## 2020-01-27 MED ORDER — ROCURONIUM BROMIDE 10 MG/ML (PF) SYRINGE
PREFILLED_SYRINGE | INTRAVENOUS | Status: AC
  Filled 2020-01-27: qty 20

## 2020-01-27 MED ORDER — POTASSIUM CHLORIDE 10 MEQ/50ML IV SOLN: 10.0000 meq | INTRAVENOUS | Status: AC

## 2020-01-27 MED ORDER — FENTANYL CITRATE (PF) 250 MCG/5ML IJ SOLN
INTRAMUSCULAR | Status: AC
  Filled 2020-01-27: qty 20

## 2020-01-27 MED ORDER — BUPIVACAINE HCL (PF) 0.5 % IJ SOLN
INTRAMUSCULAR | Status: AC
  Filled 2020-01-27: qty 30

## 2020-01-27 MED ORDER — ACETAMINOPHEN 160 MG/5ML PO SOLN
1000.0000 mg | Freq: Four times a day (QID) | ORAL | Status: AC
  Administered 2020-01-28 – 2020-02-01 (×11): 1000 mg
  Filled 2020-01-27 (×11): qty 40.6

## 2020-01-27 MED ORDER — ACETAMINOPHEN 500 MG PO TABS
1000.0000 mg | ORAL_TABLET | Freq: Four times a day (QID) | ORAL | Status: AC
  Administered 2020-01-28 – 2020-01-29 (×4): 1000 mg via ORAL
  Filled 2020-01-27 (×4): qty 2

## 2020-01-27 MED ORDER — METOPROLOL TARTRATE 5 MG/5ML IV SOLN
2.5000 mg | INTRAVENOUS | Status: DC | PRN
  Administered 2020-02-01 – 2020-02-03 (×2): 5 mg via INTRAVENOUS
  Filled 2020-01-27 (×5): qty 5

## 2020-01-27 MED ORDER — SODIUM CHLORIDE 0.9% FLUSH: 3.0000 mL | INTRAVENOUS | Status: DC | PRN

## 2020-01-27 MED ORDER — LEVOFLOXACIN IN D5W 750 MG/150ML IV SOLN
750.0000 mg | INTRAVENOUS | Status: AC
  Administered 2020-01-28: 750 mg via INTRAVENOUS
  Filled 2020-01-27: qty 150

## 2020-01-27 MED ORDER — VANCOMYCIN HCL IN DEXTROSE 1-5 GM/200ML-% IV SOLN
1000.0000 mg | Freq: Once | INTRAVENOUS | Status: AC
  Administered 2020-01-28: 1000 mg via INTRAVENOUS
  Filled 2020-01-27: qty 200

## 2020-01-27 MED ORDER — FAMOTIDINE IN NACL 20-0.9 MG/50ML-% IV SOLN
20.0000 mg | Freq: Two times a day (BID) | INTRAVENOUS | Status: AC
  Administered 2020-01-28: 20 mg via INTRAVENOUS
  Filled 2020-01-27: qty 50

## 2020-01-27 MED ORDER — HEMOSTATIC AGENTS (NO CHARGE) OPTIME
TOPICAL | Status: DC | PRN
  Administered 2020-01-27 (×2): 1 via TOPICAL

## 2020-01-27 MED ORDER — FENTANYL CITRATE (PF) 100 MCG/2ML IJ SOLN
50.0000 ug | INTRAMUSCULAR | Status: DC | PRN
  Administered 2020-01-27 – 2020-01-28 (×4): 100 ug via INTRAVENOUS
  Administered 2020-01-29 (×2): 50 ug via INTRAVENOUS
  Administered 2020-01-29: 100 ug via INTRAVENOUS
  Administered 2020-02-02: 50 ug via INTRAVENOUS
  Administered 2020-02-02: 100 ug via INTRAVENOUS
  Administered 2020-02-03 (×4): 50 ug via INTRAVENOUS
  Filled 2020-01-27 (×10): qty 2

## 2020-01-27 SURGICAL SUPPLY — 113 items
ADAPTER CARDIO PERF ANTE/RETRO (ADAPTER) ×4 IMPLANT
ADH SKN CLS APL DERMABOND .7 (GAUZE/BANDAGES/DRESSINGS) ×3
ADPR PRFSN 84XANTGRD RTRGD (ADAPTER) ×3
APPLIER CLIP 9.375 SM OPEN (CLIP) ×4
APR CLP SM 9.3 20 MLT OPN (CLIP) ×3
BAG DECANTER FOR FLEXI CONT (MISCELLANEOUS) ×4 IMPLANT
BASKET HEART (ORDER IN 25'S) (MISCELLANEOUS) ×4
BASKET HEART (ORDER IN 25S) (MISCELLANEOUS) ×3 IMPLANT
BLADE CLIPPER SURG (BLADE) ×4 IMPLANT
BLADE STERNUM SYSTEM 6 (BLADE) ×4 IMPLANT
BLADE SURG 15 STRL LF DISP TIS (BLADE) ×3 IMPLANT
BLADE SURG 15 STRL SS (BLADE) ×8
BNDG ELASTIC 4X5.8 VLCR STR LF (GAUZE/BANDAGES/DRESSINGS) ×4 IMPLANT
BNDG ELASTIC 6X5.8 VLCR STR LF (GAUZE/BANDAGES/DRESSINGS) ×3 IMPLANT
BNDG GAUZE ELAST 4 BULKY (GAUZE/BANDAGES/DRESSINGS) ×4 IMPLANT
CANISTER SUCT 3000ML PPV (MISCELLANEOUS) ×4 IMPLANT
CANNULA NON VENT 18FR 12 (CANNULA) ×1 IMPLANT
CATH CPB KIT HENDRICKSON (MISCELLANEOUS) ×4 IMPLANT
CATH ROBINSON RED A/P 18FR (CATHETERS) ×8 IMPLANT
CLIP APPLIE 9.375 SM OPEN (CLIP) ×3 IMPLANT
CLIP RETRACTION 3.0MM CORONARY (MISCELLANEOUS) ×3 IMPLANT
CLIP VESOCCLUDE MED 24/CT (CLIP) IMPLANT
CLIP VESOCCLUDE SM WIDE 24/CT (CLIP) ×1 IMPLANT
CONN ST 1/4X3/8  BEN (MISCELLANEOUS) ×8
CONN ST 1/4X3/8 BEN (MISCELLANEOUS) IMPLANT
COVER MAYO STAND STRL (DRAPES) ×3 IMPLANT
CUFF TOURN SGL QUICK 18X4 (TOURNIQUET CUFF) IMPLANT
CUFF TOURN SGL QUICK 24 (TOURNIQUET CUFF)
CUFF TRNQT CYL 24X4X16.5-23 (TOURNIQUET CUFF) IMPLANT
DERMABOND ADVANCED (GAUZE/BANDAGES/DRESSINGS) ×1
DERMABOND ADVANCED .7 DNX12 (GAUZE/BANDAGES/DRESSINGS) ×3 IMPLANT
DRAIN CHANNEL 28F RND 3/8 FF (WOUND CARE) ×12 IMPLANT
DRAPE CARDIOVASCULAR INCISE (DRAPES) ×4
DRAPE EXTREMITY T 121X128X90 (DISPOSABLE) ×1 IMPLANT
DRAPE HALF SHEET 40X57 (DRAPES) ×4 IMPLANT
DRAPE SLUSH/WARMER DISC (DRAPES) ×4 IMPLANT
DRAPE SRG 135X102X78XABS (DRAPES) ×3 IMPLANT
DRSG AQUACEL AG ADV 3.5X14 (GAUZE/BANDAGES/DRESSINGS) ×4 IMPLANT
ELECT CAUTERY BLADE 6.4 (BLADE) ×4 IMPLANT
ELECT REM PT RETURN 9FT ADLT (ELECTROSURGICAL) ×8
ELECTRODE REM PT RTRN 9FT ADLT (ELECTROSURGICAL) ×6 IMPLANT
FELT TEFLON 1X6 (MISCELLANEOUS) ×7 IMPLANT
FIBERTAPE STERNAL CLSR 2 36IN (Sternal Fixation) ×3 IMPLANT
FIBERTAPE STERNAL CLSR 2X36 (Sternal Fixation) ×2 IMPLANT
GAUZE SPONGE 4X4 12PLY STRL (GAUZE/BANDAGES/DRESSINGS) ×7 IMPLANT
GAUZE SPONGE 4X4 12PLY STRL LF (GAUZE/BANDAGES/DRESSINGS) ×1 IMPLANT
GEL ULTRASOUND 20GR AQUASONIC (MISCELLANEOUS) ×4 IMPLANT
GLOVE BIO SURGEON STRL SZ 6 (GLOVE) ×4 IMPLANT
GLOVE NEODERM STRL 7.5  LF PF (GLOVE) ×15
GLOVE NEODERM STRL 7.5 LF PF (GLOVE) ×9 IMPLANT
GLOVE SURG NEODERM 7.5  LF PF (GLOVE) ×5
GLOVE SURG SS PI 6.5 STRL IVOR (GLOVE) ×1 IMPLANT
GLOVE SURG SS PI 7.5 STRL IVOR (GLOVE) ×1 IMPLANT
GOWN STRL REUS W/ TWL LRG LVL3 (GOWN DISPOSABLE) ×12 IMPLANT
GOWN STRL REUS W/TWL LRG LVL3 (GOWN DISPOSABLE) ×36
HEMOSTAT POWDER SURGIFOAM 1G (HEMOSTASIS) ×8 IMPLANT
KIT BASIN OR (CUSTOM PROCEDURE TRAY) ×4 IMPLANT
KIT SUCTION CATH 14FR (SUCTIONS) ×4 IMPLANT
KIT TURNOVER KIT B (KITS) ×4 IMPLANT
KIT VASOVIEW HEMOPRO 2 VH 4000 (KITS) ×3 IMPLANT
MARKER GRAFT CORONARY BYPASS (MISCELLANEOUS) ×12 IMPLANT
NDL 18GX1X1/2 (RX/OR ONLY) (NEEDLE) ×3 IMPLANT
NEEDLE 18GX1X1/2 (RX/OR ONLY) (NEEDLE) ×4 IMPLANT
NS IRRIG 1000ML POUR BTL (IV SOLUTION) ×20 IMPLANT
PACK E OPEN HEART (SUTURE) ×4 IMPLANT
PACK OPEN HEART (CUSTOM PROCEDURE TRAY) ×4 IMPLANT
PACK SPY-PHI (KITS) ×1 IMPLANT
PAD ARMBOARD 7.5X6 YLW CONV (MISCELLANEOUS) ×8 IMPLANT
PAD ELECT DEFIB RADIOL ZOLL (MISCELLANEOUS) ×4 IMPLANT
PENCIL BUTTON HOLSTER BLD 10FT (ELECTRODE) ×4 IMPLANT
POSITIONER HEAD DONUT 9IN (MISCELLANEOUS) ×4 IMPLANT
POWDER SURGICEL 3.0 GRAM (HEMOSTASIS) ×1 IMPLANT
PUNCH AORTIC ROT 4.0MM RCL 40 (MISCELLANEOUS) ×1 IMPLANT
SEALANT SURG COSEAL 8ML (VASCULAR PRODUCTS) ×1 IMPLANT
SET CARDIOPLEGIA MPS 5001102 (MISCELLANEOUS) ×1 IMPLANT
SHEARS HARMONIC 9CM CVD (BLADE) IMPLANT
SHEARS HARMONIC STRL 23CM (MISCELLANEOUS) ×4 IMPLANT
SPONGE LAP 18X18 RF (DISPOSABLE) ×1 IMPLANT
SUT BONE WAX W31G (SUTURE) ×4 IMPLANT
SUT MNCRL AB 3-0 PS2 18 (SUTURE) ×8 IMPLANT
SUT MNCRL AB 4-0 PS2 18 (SUTURE) ×1 IMPLANT
SUT PDS AB 1 CTX 36 (SUTURE) ×8 IMPLANT
SUT PROLENE 3 0 SH DA (SUTURE) ×4 IMPLANT
SUT PROLENE 5 0 C 1 36 (SUTURE) IMPLANT
SUT PROLENE 6 0 C 1 30 (SUTURE) ×13 IMPLANT
SUT PROLENE 8 0 BV175 6 (SUTURE) ×5 IMPLANT
SUT PROLENE BLUE 7 0 (SUTURE) ×4 IMPLANT
SUT SILK  1 MH (SUTURE) ×8
SUT SILK 1 MH (SUTURE) IMPLANT
SUT SILK 2 0 SH CR/8 (SUTURE) IMPLANT
SUT SILK 3 0 SH CR/8 (SUTURE) IMPLANT
SUT STEEL 6MS V (SUTURE) ×3 IMPLANT
SUT STEEL SZ 6 DBL 3X14 BALL (SUTURE) ×3 IMPLANT
SUT VIC AB 2-0 CT1 27 (SUTURE) ×4
SUT VIC AB 2-0 CT1 TAPERPNT 27 (SUTURE) IMPLANT
SUT VIC AB 2-0 CTX 27 (SUTURE) IMPLANT
SUT VIC AB 3-0 SH 27 (SUTURE)
SUT VIC AB 3-0 SH 27X BRD (SUTURE) IMPLANT
SUT VIC AB 3-0 X1 27 (SUTURE) IMPLANT
SYR 10ML LL (SYRINGE) ×1 IMPLANT
SYR 30ML LL (SYRINGE) ×5 IMPLANT
SYR 3ML LL SCALE MARK (SYRINGE) ×4 IMPLANT
SYR 50ML SLIP (SYRINGE) IMPLANT
SYSTEM SAHARA CHEST DRAIN ATS (WOUND CARE) ×4 IMPLANT
TAPE CLOTH SURG 4X10 WHT LF (GAUZE/BANDAGES/DRESSINGS) ×1 IMPLANT
TAPE PAPER 2X10 WHT MICROPORE (GAUZE/BANDAGES/DRESSINGS) ×1 IMPLANT
TOWEL GREEN STERILE (TOWEL DISPOSABLE) ×4 IMPLANT
TOWEL GREEN STERILE FF (TOWEL DISPOSABLE) ×4 IMPLANT
TRAY FOLEY SLVR 16FR TEMP STAT (SET/KITS/TRAYS/PACK) ×4 IMPLANT
TUBING LAP HI FLOW INSUFFLATIO (TUBING) ×3 IMPLANT
UNDERPAD 30X30 (UNDERPADS AND DIAPERS) ×5 IMPLANT
WATER STERILE IRR 1000ML POUR (IV SOLUTION) ×8 IMPLANT
WATER STERILE IRR 1000ML UROMA (IV SOLUTION) ×1 IMPLANT

## 2020-01-27 NOTE — Anesthesia Procedure Notes (Signed)
Central Venous Catheter Insertion Performed by: Roberts Gaudy, MD, anesthesiologist Start/End3/10/2020 2:40 PM, 01/27/2020 2:50 PM Patient location: Pre-op. Preanesthetic checklist: patient identified, IV checked, site marked, risks and benefits discussed, surgical consent, monitors and equipment checked, pre-op evaluation, timeout performed and anesthesia consent Lidocaine 1% used for infiltration and patient sedated Hand hygiene performed  and maximum sterile barriers used  Catheter size: 8 Fr Total catheter length 16. Central line was placed.Double lumen Procedure performed using ultrasound guided technique. Ultrasound Notes:anatomy identified, needle tip was noted to be adjacent to the nerve/plexus identified, no ultrasound evidence of intravascular and/or intraneural injection and image(s) printed for medical record Attempts: 1 Following insertion, dressing applied and line sutured. Post procedure assessment: blood return through all ports  Patient tolerated the procedure well with no immediate complications.

## 2020-01-27 NOTE — Anesthesia Procedure Notes (Addendum)
Arterial Line Insertion Start/End3/10/2020 1:35 PM, 01/27/2020 1:45 PM Performed by: Amadeo Garnet, CRNA, CRNA  Preanesthetic checklist: patient identified, IV checked, site marked, risks and benefits discussed, surgical consent, monitors and equipment checked, pre-op evaluation, timeout performed and anesthesia consent Lidocaine 1% used for infiltration radial was placed Catheter size: 20 G Hand hygiene performed , maximum sterile barriers used  and Seldinger technique used Allen's test indicative of satisfactory collateral circulation Attempts: 2 Procedure performed without using ultrasound guided technique. Following insertion, Biopatch and dressing applied. Post procedure assessment: normal  Patient tolerated the procedure well with no immediate complications. Additional procedure comments: Patient report pre-op numbness to right left hands from C5 cervical spondylosis.  No changes from pre to post a-line insertion.

## 2020-01-27 NOTE — Consult Note (Signed)
[address]       [place] [address]             [phone]        [name] [name] [place] Medical Record [id number] Date of Birth: Aug 14, 1941  Referring: No ref. provider found Primary Care: [name], MD Primary Cardiologist:[place] Doreatha Lew, MD  Chief Complaint:   No chief complaint on file. 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 Sx have been noted since last Summer and have increased in frequency and severity. She has found relief with rest. She underwent stress test which was nonconclusive for ischemia but a CT coronary showed high calcium scoring. This was followed by [facility] yesterday demonstrating severe 3 V CAD including totally occluded RCA and LM CAD. She is stable without pain now. Consult received for CABG. Her PMHx is otherwise notable for cerebrovascular disease, HTN, HLD, and h/o benign brain mass.  Current Activity/ Functional Status: Patient will be independent with mobility/ambulation, transfers, ADL's, IADL's.   Zubrod Score: At the time of surgery this patient's most appropriate activity status/level should be described as: []     0    Normal activity, no symptoms []     1    Restricted in physical strenuous activity but ambulatory, able to do out light work []     2    Ambulatory and capable of self care, unable to do work activities, up and about                 more than 50%  Of the time                            []     3    Only limited self care, in bed greater than 50% of waking hours []     4    Completely disabled, no self care, confined to bed or chair []     5    Moribund  Past Medical History:  Diagnosis Date  . Coronary artery disease   . Family history of adverse reaction to anesthesia    " My Sister did "  . HTN (hypertension)   . Hyperlipidemia   . Hypothyroidism   .  Osteoporosis   . Thyroid carcinoma [facility])     Past Surgical History:  Procedure Laterality Date  . ABDOMINAL HYSTERECTOMY    . ANKLE SURGERY    . APPENDECTOMY    . CARDIAC CATHETERIZATION  01/25/2010  . CHOLECYSTECTOMY    . gamma knife    . LEFT HEART CATH AND CORONARY ANGIOGRAPHY N/A 01/26/2020   Procedure: LEFT HEART CATH AND CORONARY ANGIOGRAPHY;  Surgeon: Belva Crome, MD;  Location: [facility];  Service: Cardiovascular;  Laterality: N/A;  . THYROIDECTOMY    . TOTAL HIP ARTHROPLASTY      Social History   Tobacco Use  Smoking Status Never Smoker  Smokeless Tobacco Never Used    Social History   Substance and Sexual Activity  Alcohol Use Not Currently     Allergies  Allergen Reactions  . Bee Venom Swelling    Massive swelling  . Penicillins Other (See Comments)    UNSPECIFIED REACTION  Did it involve swelling of the face/tongue/throat, SOB, or low BP?  Unknown Did it involve sudden or severe rash/hives, skin peeling, or any reaction on the inside of your mouth or nose? Unknown Did you need to seek medical attention at a hospital or doctor's office? Unknown When did it last happen?1945 If all above answers are "NO", may proceed with cephalosporin use.  . Adhesive [Tape] Rash  . Morphine And Related Rash  . Sulfa Antibiotics Nausea And Vomiting and Rash    Current Facility-Administered Medications  Medication Dose Route Frequency Provider Last Rate Last Admin  . 0.9 %  sodium chloride infusion  250 mL Intravenous PRN Belva Crome, MD      . acetaminophen (TYLENOL) tablet 650 mg  650 mg Oral Q4H PRN Belva Crome, MD      . aspirin chewable tablet 81 mg  81 mg Oral Daily Belva Crome, MD   81 mg at 01/26/20 1846  . atorvastatin (LIPITOR) tablet 80 mg  80 mg Oral Q1200 Belva Crome, MD      . bisacodyl (DULCOLAX) EC tablet 5 mg  5 mg Oral Once [name], MD      . cholecalciferol (VITAMIN D3) tablet 1,000 Units  1,000 Units Oral Q1200  Belva Crome, MD      . dexmedetomidine (PRECEDEX) 400 MCG/100ML (4 mcg/mL) infusion  0.1-0.7 mcg/kg/hr Intravenous To OR Facundo Allemand, Glenice Bow, MD      . EPINEPHrine (ADRENALIN) 4 mg in NS 250 mL (0.016 mg/mL) premix infusion  0-10 mcg/min Intravenous To OR Roxie Gueye, Glenice Bow, MD      . fluticasone (FLONASE) 50 MCG/ACT nasal spray 2 spray  2 spray Each Nare Daily Belva Crome, MD   2 spray at 01/26/20 2019  . heparin 2,500 Units, papaverine 30 mg in electrolyte-148 (PLASMALYTE-148) 500 mL irrigation   Irrigation To OR Sloka Volante Z, MD      . heparin 30,000 units/NS 1000 mL solution for CELLSAVER   Other To OR Day Deery, Glenice Bow, MD      . heparin ADULT infusion 100 units/mL (25000 units/267mL sodium chloride 0.45%)  900 Units/hr Intravenous Continuous Lyndee Leo, RPH 9 mL/hr at 01/26/20 2233 900 Units/hr at 01/26/20 2233  . insulin regular, human (MYXREDLIN) 100 units/ 100 mL infusion   Intravenous To OR Chenelle Benning, Glenice Bow, MD      . levofloxacin (LEVAQUIN) IVPB 500 mg  500 mg Intravenous To OR Daryn Pisani, Glenice Bow, MD      . levothyroxine (SYNTHROID) tablet 100 mcg  100 mcg Oral Once per day on Mon Wed Fri Belva Crome, MD   100 mcg at 01/27/20 [phone]  . [START ON 01/28/2020] levothyroxine (SYNTHROID) tablet 200 mcg  200 mcg Oral Once per day on Sun Tue Thu Sat Belva Crome, MD      . lisinopril (ZESTRIL) tablet 20 mg  20 mg Oral Daily Belva Crome, MD      . magnesium sulfate (IV Push/IM) injection 40 mEq  40 mEq Other To OR Mariusz Jubb, Glenice Bow, MD      . metoprolol tartrate (LOPRESSOR) tablet 12.5 mg  12.5 mg Oral Once [name], MD      . metoprolol tartrate (LOPRESSOR) tablet 25 mg  25 mg Oral BID PC Belva Crome, MD   25 mg at 01/26/20 1846  . milrinone (PRIMACOR) 20 MG/100 ML (0.2 mg/mL) infusion  0.3 mcg/kg/min Intravenous To OR Ahilyn Nell Z, MD      . nitroGLYCERIN (NITROSTAT) SL tablet 0.4 mg  0.4 mg Sublingual Q5 min PRN Belva Crome, MD      . nitroGLYCERIN 50 mg  in dextrose 5 % 250 mL (0.2 mg/mL) infusion  2-200 mcg/min Intravenous To OR Keyshawn Hellwig, Glenice Bow, MD      . norepinephrine (LEVOPHED) 4mg  in 264mL premix infusion  0-40 mcg/min Intravenous To OR Sidonia Nutter, Glenice Bow, MD      . ondansetron (ZOFRAN) injection 4 mg  4 mg Intravenous Q6H PRN Belva Crome, MD      . phenylephrine (NEOSYNEPHRINE) 20-0.9 MG/250ML-% infusion  30-200 mcg/min Intravenous To OR Debera Sterba Z, MD      . potassium chloride injection 80 mEq  80 mEq Other To OR Maher Shon, Glenice Bow, MD      . prenatal multivitamin tablet 1 tablet  1 tablet Oral Q1200 Belva Crome, MD      . sodium chloride flush (NS) 0.9 % injection 3 mL  3 mL Intravenous Q12H Belva Crome, MD   3 mL at 01/26/20 1850  . sodium chloride flush (NS) 0.9 % injection 3 mL  3 mL Intravenous PRN Belva Crome, MD      . temazepam (RESTORIL) capsule 15 mg  15 mg Oral Once PRN Stoney Karczewski, Glenice Bow, MD      . tranexamic acid (CYKLOKAPRON) 2,500 mg in sodium chloride 0.9 % 250 mL (10 mg/mL) infusion  1.5 mg/kg/hr Intravenous To OR Taher Vannote, Glenice Bow, MD      . tranexamic acid (CYKLOKAPRON) bolus via infusion - over 30 minutes 979.5 mg  15 mg/kg Intravenous To OR Renleigh Ouellet Z, MD      . tranexamic acid (CYKLOKAPRON) pump prime solution 131 mg  2 mg/kg Intracatheter To OR Ceniya Fowers, Glenice Bow, MD      . vancomycin (VANCOREADY) IVPB 1250 mg/250 mL  1,250 mg Intravenous To OR Berry Gallacher, Glenice Bow, MD      . vitamin B-12 (CYANOCOBALAMIN) tablet 2,500 mcg  2,500 mcg Oral Daily Belva Crome, MD   2,500 mcg at 01/26/20 2020    Medications Prior to Admission  Medication Sig Dispense Refill Last Dose  . acetaminophen (TYLENOL) 500 MG tablet Take 1,000 mg by mouth in the morning and at bedtime.    01/26/2020 at 0800  . aspirin EC 81 MG tablet Take 81 mg by mouth at bedtime.   01/26/2020 at 0700  . atorvastatin (LIPITOR) 80 MG tablet Take 80 mg by mouth daily at 12 noon.    01/25/2020 at Unknown time  . cholecalciferol (VITAMIN D) 1000  UNITS tablet Take 1,000 Units by mouth daily at 12 noon.    01/25/2020 at Unknown time  . fluticasone (FLONASE) 50 MCG/ACT nasal spray Place 2 sprays into both nostrils daily.   Past Week at Unknown time  . levothyroxine (SYNTHROID, LEVOTHROID) 200 MCG tablet Take 100-200 mcg by mouth See admin instructions. Take 200 mcg  SUN-TUES-THUR-SAT Take 100 mcg tab MON-WED-FRI  DO NOT USE GENERIC   01/26/2020 at 0600  . lisinopril (ZESTRIL) 20 MG tablet Take 20 mg by mouth daily.    01/26/2020 at Unknown time  . metoprolol tartrate (LOPRESSOR) 25 MG tablet Take 1 tablet (25 mg total) by mouth 2 (two) times daily. (Patient taking differently: Take 25 mg by mouth 2 (two) times daily after a meal. ) 180 tablet 1 01/26/2020 at Unknown time  . NAPROXEN PO Take 220 mg by mouth in the morning and at bedtime.    Past Week at Unknown time  .  nitroGLYCERIN (NITROSTAT) 0.4 MG SL tablet Place 1 tablet (0.4 mg total) under the tongue every 5 (five) minutes as needed for chest pain. 30 tablet 5 Past Week at Unknown time  . Prenatal Vit-Fe Fumarate-FA (PRENATAL MULTIVITAMIN) TABS tablet Take 1 tablet by mouth daily at 12 noon.   01/25/2020 at Unknown time  . vitamin B-12 (CYANOCOBALAMIN) 1000 MCG tablet Take 2,500 mcg by mouth daily.   01/25/2020 at Unknown time    Family History  Problem Relation Age of Onset  . Breast cancer Mother 76  . Hypertension Mother   . Diabetes Mother   . Heart disease Mother   . Cirrhosis Father   . Cancer Brother   . Heart disease Brother   . Heart attack Sister      Review of Systems:   ROS A comprehensive review of systems was negative except for: Behavioral/Psych: positive for anxiety     Cardiac Review of Systems: Y or  [    ]= no  Chest Pain [    ]  Resting SOB [   ] Exertional SOB  [  ]  Orthopnea [  ]   Pedal Edema [   ]    Palpitations [  ] Syncope  [  ]   Presyncope [   ]  General Review of Systems: [Y] = yes [  ]=no Constitional: recent weight change [  ]; anorexia [  ];  fatigue [  ]; nausea [  ]; night sweats [  ]; fever [  ]; or chills [  ]                                                               Dental: Last Dentist visit:   Eye : blurred vision [  ]; diplopia [   ]; vision changes [  ];  Amaurosis fugax[  ]; Resp: cough [  ];  wheezing[  ];  hemoptysis[  ]; shortness of breath[  ]; paroxysmal nocturnal dyspnea[  ]; dyspnea on exertion[  ]; or orthopnea[  ];  GI:  gallstones[  ], vomiting[  ];  dysphagia[  ]; melena[  ];  hematochezia [  ]; heartburn[  ];   Hx of  Colonoscopy[  ]; GU: kidney stones [  ]; hematuria[  ];   dysuria [  ];  nocturia[  ];  history of     obstruction [  ]; urinary frequency [  ]             Skin: rash, swelling[  ];, hair loss[  ];  peripheral edema[  ];  or itching[  ]; Musculosketetal: myalgias[  ];  joint swelling[  ];  joint erythema[  ];  joint pain[  ];  back pain[  ];  Heme/Lymph: bruising[  ];  bleeding[  ];  anemia[  ];  Neuro: TIA[  ];  headaches[  ];  stroke[  ];  vertigo[  ];  seizures[  ];   paresthesias[  ];  difficulty walking[  ];  Psych:depression[  ]; anxiety[  ];  Endocrine: diabetes[  ];  thyroid dysfunction[  ];             Physical Exam: BP (!) 142/56 (BP Location: Left Arm)   Pulse (!) 58  Temp 98.4 F (36.9 C) (Oral)   Resp 15   Ht 5\' 3"  (1.6 m)   Wt 65.3 kg   SpO2 97%   BMI 25.51 kg/m    General appearance: alert and cooperative Head: Normocephalic, without obvious abnormality, atraumatic Neck: no adenopathy, no carotid bruit, no JVD, supple, symmetrical, trachea midline and thyroid not enlarged, symmetric, no tenderness/mass/nodules Resp: clear to auscultation bilaterally Cardio: regular rate and rhythm, S1, S2 normal, no murmur, click, rub or gallop Extremities: extremities normal, atraumatic, no cyanosis or edema Neurologic: Alert and oriented X 3, normal strength and tone. Normal symmetric reflexes. Normal coordination and gait  Diagnostic Studies & Laboratory data:     Recent  Radiology Findings:   DG Chest 2 View  Result Date: 01/27/2020 CLINICAL DATA:  Preop.  Chest pain. EXAM: CHEST - 2 VIEW COMPARISON:  Mar 25, 2005 FINDINGS: The heart size and mediastinal contours are within normal limits. Both lungs are clear. The visualized skeletal structures are unremarkable. IMPRESSION: No active cardiopulmonary disease. Electronically Signed   By: Constance Holster M.D.   On: 01/27/2020 00:29   CARDIAC CATHETERIZATION  Result Date: 01/26/2020  Severe multivessel coronary disease.  Significant mitral annular calcification  65 to 70% tubular proximal to distal left main.  Pressure damping and ventricularization with engagement using a 5 French catheter.  Proximal to mid 70 to 80% LAD.  It is large distally and a favorable target  Circumflex is codominant in origin to at least 4 obtuse marginal branches.  The first obtuse marginal contains irregularities up to 50% of the proximal segment.  The circumflex proper contains eccentric calcified 75 to 80% narrowing.  The right coronary is codominant, contains ostial disease, and is totally occluded in the mid vessel after the origin of the right ventricular acute marginal branch.  The distal vessel fills by right to right collaterals via an acute marginal branch and also from LAD to acute marginal branch.  PDA and inferior document is small.  Inferobasal hypokinesis.  EF 55%.  LVEDP 18 mmHg. RECOMMENDATIONS:  After discussion, the decision was made to keep the patient in hospital, have her expediently evaluated by cardiac surgery, and to move forward with coronary bypass grafting as soon as possible.  Continue aspirin.  Add long-acting nitrates for anti-ischemic effect and to help with blood pressure.  Continue beta-blocker therapy.  IV heparin will be added after sheath pull and hemostasis established for at least 8 hours.  VAS US DOPPLER PRE CABG  Result Date: 01/26/2020 PREOPERATIVE VASCULAR EVALUATION  Indications:       Pre-CABG. Risk Factors:     Hypertension. Comparison Study: No prior studies. Performing Technologist: Maudry Mayhew RDMS, RVT, RDCS  Examination Guidelines: A complete evaluation includes B-mode imaging, spectral Doppler, color Doppler, and power Doppler as needed of all accessible portions of each vessel. Bilateral testing is considered an integral part of a complete examination. Limited examinations for reoccurring indications may be performed as noted.  Left Carotid Findings: +----------+--------+--------+--------+------------+ SubclavianPSV cm/sEDV cm/sDescribeArm Pressure +----------+--------+--------+--------+------------+                                   160          +----------+--------+--------+--------+------------+  ABI Findings: +--------+------------------+-----+--------+-------+ Left    Lt Pressure (mmHg)IndexWaveformComment +--------+------------------+-----+--------+-------+ Brachial160                                    +--------+------------------+-----+--------+-------+  Right Doppler Findings: +-----------+--------+-----+---------+--------+ Site       PressureIndexDoppler  Comments +-----------+--------+-----+---------+--------+ Radial                  triphasic         +-----------+--------+-----+---------+--------+ Ulnar                   triphasic         +-----------+--------+-----+---------+--------+ Palmar Arch                      TR band  +-----------+--------+-----+---------+--------+  Left Doppler Findings: +--------+--------+-----+---------+--------+ Site    PressureIndexDoppler  Comments +--------+--------+-----+---------+--------+ [id number]                            +--------+--------+-----+---------+--------+ Radial               triphasic         +--------+--------+-----+---------+--------+ Ulnar                triphasic         +--------+--------+-----+---------+--------+  Left Upper Extremity:  Doppler waveforms remain within normal limits with left radial compression. Doppler waveforms remain within normal limits with left ulnar compression.    Preliminary      I have independently reviewed the above radiologic studies in addition to the left heart catheterization findings and discussed these with the patient. She expresses understanding of the objective findings.  Recent Lab Findings: Lab Results  Component Value Date   WBC 7.8 01/27/2020   HGB 12.5 01/27/2020   HCT 38.0 01/27/2020   PLT 230 01/27/2020   GLUCOSE 91 01/23/2020   NA 142 01/23/2020   K 5.4 (H) 01/23/2020   CL 106 01/23/2020   CREATININE 0.84 01/23/2020   BUN 18 01/23/2020   CO2 22 01/23/2020   INR 1.0 01/27/2020      Assessment / Plan:      Very pleasant 79 yo lady with severe CAD. Agree that urgent CABG is best therapeutic decision. We will proceed this afternoon and will plan to use a multiple arterial grafting strategy.      I  spent 40 minutes counseling the patient face to face.   Duwayne Matters Z. Orvan Seen, MD [phone] 01/27/2020 6:51 AM

## 2020-01-27 NOTE — Anesthesia Procedure Notes (Addendum)
Central Venous Catheter Insertion Performed by: Roberts Gaudy, MD, anesthesiologist Start/End3/10/2020 2:40 PM, 01/27/2020 2:50 PM Patient location: Pre-op. Preanesthetic checklist: patient identified, IV checked, site marked, risks and benefits discussed, surgical consent, monitors and equipment checked, pre-op evaluation, timeout performed and anesthesia consent Lidocaine 1% used for infiltration and patient sedated Hand hygiene performed  and maximum sterile barriers used  Catheter size: 8.5 Fr Sheath introducer Procedure performed using ultrasound guided technique. Ultrasound Notes:anatomy identified, needle tip was noted to be adjacent to the nerve/plexus identified, no ultrasound evidence of intravascular and/or intraneural injection and image(s) printed for medical record Attempts: 1 Following insertion, line sutured and dressing applied. Post procedure assessment: blood return through all ports, free fluid flow and no air  Patient tolerated the procedure well with no immediate complications.

## 2020-01-27 NOTE — Anesthesia Procedure Notes (Signed)
Procedure Name: Intubation Date/Time: 01/27/2020 2:42 PM Performed by: Oletta Lamas, CRNA Pre-anesthesia Checklist: Patient identified, Emergency Drugs available, Suction available and Patient being monitored Patient Re-evaluated:Patient Re-evaluated prior to induction Oxygen Delivery Method: Circle System Utilized Preoxygenation: Pre-oxygenation with 100% oxygen Induction Type: IV induction Ventilation: Mask ventilation without difficulty Laryngoscope Size: Miller and 2 Grade View: Grade I Tube type: Oral Number of attempts: 1 Airway Equipment and Method: Stylet Placement Confirmation: ETT inserted through vocal cords under direct vision,  positive ETCO2 and breath sounds checked- equal and bilateral Secured at: 21 cm Tube secured with: Tape Dental Injury: Teeth and Oropharynx as per pre-operative assessment

## 2020-01-27 NOTE — Anesthesia Procedure Notes (Signed)
Central Venous Catheter Insertion Performed by: Roberts Gaudy, MD, anesthesiologist Start/End3/10/2020 2:40 PM, 01/27/2020 2:50 PM Patient location: Pre-op. Preanesthetic checklist: patient identified, IV checked, site marked, risks and benefits discussed, surgical consent, monitors and equipment checked, pre-op evaluation, timeout performed and anesthesia consent Hand hygiene performed  and maximum sterile barriers used  PA cath was placed.Sheath introducer Swan type:thermodilution Procedure performed without using ultrasound guided technique. Attempts: 1 Following insertion, line sutured, dressing applied and Biopatch. Patient tolerated the procedure well with no immediate complications. Additional procedure comments: Secured at 42 cm depth.

## 2020-01-27 NOTE — Transfer of Care (Signed)
Immediate Anesthesia Transfer of Care Note  Patient: [name] [name]  Procedure(s) Performed: CORONARY ARTERY BYPASS GRAFTING (CABG) x 4 using LIMA to LAD; RIMA to OM1; Left radia sequential PDA and Left PL. (N/A Chest) RADIAL ARTERY HARVEST (OPEN HARVEST) (Left Arm Lower) TRANSESOPHAGEAL ECHOCARDIOGRAM (TEE) (N/A ) INDOCYANINE GREEN FLUORESCENCE IMAGING (ICG) (N/A )  Patient Location: ICU  Anesthesia Type:General  Level of Consciousness: sedated and Patient remains intubated per anesthesia plan  Airway & Oxygen Therapy: Patient remains intubated per anesthesia plan and Patient placed on Ventilator (see vital sign flow sheet for setting)  Post-op Assessment: Report given to RN and Post -op Vital signs reviewed and stable  Post vital signs: Reviewed and stable  Last Vitals:  Vitals Value Taken Time  BP    Temp    Pulse    Resp    SpO2      Last Pain:  Vitals:   01/27/20 0452  TempSrc: Oral  PainSc:          Complications: No apparent anesthesia complications

## 2020-01-27 NOTE — H&P (Signed)
History and Physical Interval Note:  01/27/2020 2:24 PM  [name] [name]  has presented today for surgery, with the diagnosis of CAD LEFT MAIN DISEASE.  The various methods of treatment have been discussed with the patient and family. After consideration of risks, benefits and other options for treatment, the patient has consented to  Procedure(s) with comments: CORONARY ARTERY BYPASS GRAFTING (CABG) (N/A) - BILATERAL IMA RADIAL ARTERY HARVEST (Bilateral) TRANSESOPHAGEAL ECHOCARDIOGRAM (TEE) (N/A) INDOCYANINE GREEN FLUORESCENCE IMAGING (ICG) (N/A) as a surgical intervention.  The patient's history has been reviewed, patient examined, no change in status, stable for surgery.  I have reviewed the patient's chart and labs.  Questions were answered to the patient's satisfaction.     [name] [name]

## 2020-01-27 NOTE — Progress Notes (Signed)
[facility] for heparin Indication: chest pain/ACS  Allergies  Allergen Reactions  . Bee Venom Swelling    Massive swelling  . Penicillins Other (See Comments)    UNSPECIFIED REACTION  Did it involve swelling of the face/tongue/throat, SOB, or low BP? Unknown Did it involve sudden or severe rash/hives, skin peeling, or any reaction on the inside of your mouth or nose? Unknown Did you need to seek medical attention at a hospital or doctor's office? Unknown When did it last happen?1945 If all above answers are "NO", may proceed with cephalosporin use.  . Adhesive [Tape] Rash  . Morphine And Related Rash  . Sulfa Antibiotics Nausea And Vomiting and Rash    Patient Measurements: Height: 5\' 3"  (160 cm) Weight: 144 lb (65.3 kg) IBW/kg (Calculated) : 52.4 Heparin Dosing Weight: 65kg  Vital Signs: Temp: 98.4 F (36.9 C) (03/12 0452) Temp Source: Oral (03/12 0452) BP: 142/56 (03/12 0452) Pulse Rate: 58 (03/12 0452)  Labs: Recent Labs    01/27/20 0524 01/27/20 0736  HGB 12.5  --   HCT 38.0  --   PLT 230  --   APTT 76*  --   LABPROT 12.9  --   INR 1.0  --   HEPARINUNFRC  --  0.67  CREATININE 0.86  --     Estimated Creatinine Clearance: 50.6 mL/min (by C-G formula based on SCr of 0.86 mg/dL).   Medical History: Past Medical History:  Diagnosis Date  . Coronary artery disease   . Family history of adverse reaction to anesthesia    " My Sister did "  . HTN (hypertension)   . Hyperlipidemia   . Hypothyroidism   . Osteoporosis   . Thyroid carcinoma (HCC)    Assessment: 73 yof s/p cath found to have multivessel CAD. Pharmacy dosing heparin. Plans for CABG today -heparin level at goal  Goal of Therapy:  Heparin level 0.3-0.7 units/ml Monitor platelets by anticoagulation protocol: Yes   Plan:  -No heparin changes needed -CABG today  Hildred Laser, PharmD Clinical Pharmacist **Pharmacist phone directory can now be  found on [email or web address] ([email or web address]).  Listed under [place].   e

## 2020-01-27 NOTE — Progress Notes (Addendum)
Progress Note  Patient Name: [name] [name] Date of Encounter: 01/27/2020  Primary Cardiologist: Evalina Field, MD   Subjective   No chest pain or dyspnea.  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  Not a good experience with [facility] when had Epic transferred leading to retirement. She used to work for [employer].   Inpatient Medications    Scheduled Meds: . aspirin  81 mg Oral Daily  . atorvastatin  80 mg Oral Q1200  . bisacodyl  5 mg Oral Once  . cholecalciferol  1,000 Units Oral Q1200  . epinephrine  0-10 mcg/min Intravenous To OR  . fluticasone  2 spray Each Nare Daily  . heparin-papaverine-plasmalyte irrigation   Irrigation To OR  . insulin   Intravenous To OR  . levothyroxine  100 mcg Oral Once per day on Mon Wed Fri  . [START ON 01/28/2020] levothyroxine  200 mcg Oral Once per day on Sun Tue Thu Sat  . lisinopril  20 mg Oral Daily  . magnesium sulfate  40 mEq Other To OR  . metoprolol tartrate  12.5 mg Oral Once  . metoprolol tartrate  25 mg Oral BID PC  . phenylephrine  30-200 mcg/min Intravenous To OR  . potassium chloride  80 mEq Other To OR  . prenatal multivitamin  1 tablet Oral Q1200  . sodium chloride flush  3 mL Intravenous Q12H  . tranexamic acid  15 mg/kg Intravenous To OR  . tranexamic acid  2 mg/kg Intracatheter To OR  . vitamin B-12  2,500 mcg Oral Daily   Continuous Infusions: . sodium chloride    . dexmedetomidine    . heparin 30,000 units/NS 1000 mL solution for CELLSAVER    . heparin 900 Units/hr (01/26/20 2233)  . levofloxacin (LEVAQUIN) IV    . milrinone    . nitroGLYCERIN    . norepinephrine    . tranexamic acid (CYKLOKAPRON) infusion ([facility])    . vancomycin     PRN Meds: sodium chloride, acetaminophen, nitroGLYCERIN, ondansetron (ZOFRAN) IV, sodium chloride flush, temazepam   Vital Signs    Vitals:   01/26/20 1712 01/26/20 1915 01/26/20 2040 01/27/20 0452  BP: (!) 148/57  (!) 134/59 (!) 142/56  Pulse: 75 80 65 (!) 58   Resp:  20  15  Temp: 98 F (36.7 C)  98.5 F (36.9 C) 98.4 F (36.9 C)  TempSrc: Oral  Oral Oral  SpO2: 100% 98% 98% 97%  Weight:      Height:        Intake/Output Summary (Last 24 hours) at 01/27/2020 0704 Last data filed at 01/27/2020 0005 Gross per 24 hour  Intake 220 ml  Output 500 ml  Net -280 ml   Last 3 Weights 01/26/2020 01/23/2020 01/19/2020  Weight (lbs) 144 lb 144 lb 9.6 oz 141 lb 1.5 oz  Weight (kg) 65.318 kg 65.59 kg 64 kg      Telemetry    NSR - Personally Reviewed  ECG    No new tracing   Physical Exam   GEN: No acute distress.   Neck: No JVD Cardiac: RRR, no murmurs, rubs, or gallops. No hematoma on radical cath site.  Respiratory: Clear to auscultation bilaterally. GI: Soft, nontender, non-distended  MS: No edema; No deformity. Neuro:  Nonfocal  Psych: Normal affect   Labs    Chemistry Recent Labs  Lab 01/23/20 1034 01/27/20 0524  NA 142 138  K 5.4* 3.8  CL 106 105  CO2 22  21*  GLUCOSE 91 98  BUN 18 14  CREATININE 0.84 0.86  CALCIUM 10.7* 10.1  GFRNONAA 68 >60  GFRAA 78 >60  ANIONGAP  --  12     Hematology Recent Labs  Lab 01/24/20 1039 01/27/20 0524  WBC 7.7 7.8  RBC 3.91 4.23  HGB 11.7 12.5  HCT 34.9 38.0  MCV 89 89.8  MCH 29.9 29.6  MCHC 33.5 32.9  RDW 13.4 13.9  PLT 238 230     Radiology    DG Chest 2 View  Result Date: 01/27/2020 CLINICAL DATA:  Preop.  Chest pain. EXAM: CHEST - 2 VIEW COMPARISON:  Mar 25, 2005 FINDINGS: The heart size and mediastinal contours are within normal limits. Both lungs are clear. The visualized skeletal structures are unremarkable. IMPRESSION: No active cardiopulmonary disease. Electronically Signed   By: Constance Holster M.D.   On: 01/27/2020 00:29   CARDIAC CATHETERIZATION  Result Date: 01/26/2020  Severe multivessel coronary disease.  Significant mitral annular calcification  65 to 70% tubular proximal to distal left main.  Pressure damping and ventricularization with engagement  using a 5 French catheter.  Proximal to mid 70 to 80% LAD.  It is large distally and a favorable target  Circumflex is codominant in origin to at least 4 obtuse marginal branches.  The first obtuse marginal contains irregularities up to 50% of the proximal segment.  The circumflex proper contains eccentric calcified 75 to 80% narrowing.  The right coronary is codominant, contains ostial disease, and is totally occluded in the mid vessel after the origin of the right ventricular acute marginal branch.  The distal vessel fills by right to right collaterals via an acute marginal branch and also from LAD to acute marginal branch.  PDA and inferior document is small.  Inferobasal hypokinesis.  EF 55%.  LVEDP 18 mmHg. RECOMMENDATIONS:  After discussion, the decision was made to keep the patient in hospital, have her expediently evaluated by cardiac surgery, and to move forward with coronary bypass grafting as soon as possible.  Continue aspirin.  Add long-acting nitrates for anti-ischemic effect and to help with blood pressure.  Continue beta-blocker therapy.  IV heparin will be added after sheath pull and hemostasis established for at least 8 hours.  VAS US DOPPLER PRE CABG  Result Date: 01/26/2020 PREOPERATIVE VASCULAR EVALUATION  Indications:      Pre-CABG. Risk Factors:     Hypertension. Comparison Study: No prior studies. Performing Technologist: Maudry Mayhew RDMS, RVT, RDCS  Examination Guidelines: A complete evaluation includes B-mode imaging, spectral Doppler, color Doppler, and power Doppler as needed of all accessible portions of each vessel. Bilateral testing is considered an integral part of a complete examination. Limited examinations for reoccurring indications may be performed as noted.  Left Carotid Findings: +----------+--------+--------+--------+------------+ SubclavianPSV cm/sEDV cm/sDescribeArm Pressure +----------+--------+--------+--------+------------+                                    160          +----------+--------+--------+--------+------------+  ABI Findings: +--------+------------------+-----+--------+-------+ Left    Lt Pressure (mmHg)IndexWaveformComment +--------+------------------+-----+--------+-------+ Brachial160                                    +--------+------------------+-----+--------+-------+  Right Doppler Findings: +-----------+--------+-----+---------+--------+ Site       PressureIndexDoppler  Comments +-----------+--------+-----+---------+--------+ Radial  triphasic         +-----------+--------+-----+---------+--------+ Ulnar                   triphasic         +-----------+--------+-----+---------+--------+ Palmar Arch                      TR band  +-----------+--------+-----+---------+--------+  Left Doppler Findings: +--------+--------+-----+---------+--------+ Site    PressureIndexDoppler  Comments +--------+--------+-----+---------+--------+ [id number]                            +--------+--------+-----+---------+--------+ Radial               triphasic         +--------+--------+-----+---------+--------+ Ulnar                triphasic         +--------+--------+-----+---------+--------+  Left Upper Extremity: Doppler waveforms remain within normal limits with left radial compression. Doppler waveforms remain within normal limits with left ulnar compression.    Preliminary     Cardiac Studies    Cath 01/27/20  Severe multivessel coronary disease.  Significant mitral annular calcification  65 to 70% tubular proximal to distal left main.  Pressure damping and ventricularization with engagement using a 5 French catheter.  Proximal to mid 70 to 80% LAD.  It is large distally and a favorable target  Circumflex is codominant in origin to at least 4 obtuse marginal branches.  The first obtuse marginal contains irregularities up to 50% of the proximal segment.  The  circumflex proper contains eccentric calcified 75 to 80% narrowing.  The right coronary is codominant, contains ostial disease, and is totally occluded in the mid vessel after the origin of the right ventricular acute marginal branch.  The distal vessel fills by right to right collaterals via an acute marginal branch and also from LAD to acute marginal branch.  PDA and inferior document is small.  Inferobasal hypokinesis.  EF 55%.  LVEDP 18 mmHg.  RECOMMENDATIONS:   After discussion, the decision was made to keep the patient in hospital, have her expediently evaluated by cardiac surgery, and to move forward with coronary bypass grafting as soon as possible.  Continue aspirin.  Add long-acting nitrates for anti-ischemic effect and to help with blood pressure.  Continue beta-blocker therapy.  IV heparin will be added after sheath pull and hemostasis established for at least 8 hours.  Diagnostic Dominance: Co-dominant    Patient Profile     79 y.o. 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   Assessment & Plan    1. Severe multivessel CAD - Cath as above. Seen by CTCS and for CABG today. Continue ASA, statin and BB. On heparin. Chest pain free. Last carotid doppler 06/2019 showed 40-59% stenosis bilaterally. She does not wish to have carotid intervention if offered. Echo in 08/2019 showed normal LVEF. She is declining updated echo which was ordered yesterday. She will get intraoperative TEE.   2. HLD - Continue high intensity statin   3. HTN - On BB and ACE  4. UTI - UA consistent with UTI. Asymptomatic. She will get abx for surgery.   5. Carotid artery disease - As above  For questions or updates, please contact [place] Please consult  [email or web address] for contact info under    SignedLeanor Kail, PA  01/27/2020, 7:04 AM      Patient seen and examined. Agree with assessment and plan.  Angiograms reviewed.  Plan is for CABG revascularization this afternoon.  No recurrent anginal symptoms since her heart catheterization.  In retrospect, patient has been experiencing some exertional mild chest pressure since last July 2020 with recent associated exertional dyspnea.  Stable hemodynamics.  CABG today.   Troy Sine, MD, [facility] 01/27/2020 12:19 PM

## 2020-01-27 NOTE — Procedures (Signed)
Transthoracic echocardiogram attempted. Patient stated she did not want TTE since she was having TEE prior to CABG.

## 2020-01-27 NOTE — Progress Notes (Signed)
CARDIAC REHAB PHASE I   Preop education completed with pt. Pt with Cardiac Surgery booklet at bedside. Pt educated on importance of IS use, walks, and sternal precautions. Provided support and encouragement. Will continue to follow pt throughout her hospital stay.  [id number] Rufina Falco, RN BSN 01/27/2020 9:13 AM

## 2020-01-27 NOTE — Brief Op Note (Signed)
01/26/2020 - 01/27/2020  6:59 PM  PATIENT:  [name] [name]  79 y.o. female  PRE-OPERATIVE DIAGNOSIS:  CAD LEFT MAIN DISEASE  POST-OPERATIVE DIAGNOSIS:  CAD LEFT MAIN DISEASE  PROCEDURE:  Procedure(s) with comments: CORONARY ARTERY BYPASS GRAFTING (CABG) x 4 using BILATERAL IMA's and LEFT RADIAL ARTERY (N/A) - BILATERAL IMA RADIAL ARTERY HARVEST (OPEN HARVEST) (Left) TRANSESOPHAGEAL ECHOCARDIOGRAM (TEE) (N/A) INDOCYANINE GREEN FLUORESCENCE IMAGING (ICG) (N/A)  SURGEON:  Surgeon(s) and Role:    * [name], MD - Primary  PHYSICIAN ASSISTANT: Angala Hilgers  ANESTHESIA:   general  EBL: Per anesthesia and perfusion notes  BLOOD ADMINISTERED:2 units  PRBC  DRAINS: Bilateral pleural and mediastinal drains placed   LOCAL MEDICATIONS USED:  Bilateral intercostal Exparel  SPECIMEN:  No Specimen  DISPOSITION OF SPECIMEN:  N/A  COUNTS:  YES  DICTATION: .Dragon Dictation  PLAN OF CARE: Admit to inpatient   PATIENT DISPOSITION:  ICU - intubated and hemodynamically stable.   Delay start of Pharmacological VTE agent (>24hrs) due to surgical blood loss or risk of bleeding: yes

## 2020-01-27 NOTE — Anesthesia Preprocedure Evaluation (Addendum)
Anesthesia Evaluation  Patient identified by MRN, date of birth, ID band Patient awake    Reviewed: Allergy & Precautions, NPO status , Patient's Chart, lab work & pertinent test results  Airway Mallampati: II  TM Distance: >3 FB Neck ROM: Full    Dental  (+) Teeth Intact, Dental Advisory Given   Pulmonary    breath sounds clear to auscultation       Cardiovascular hypertension,  Rhythm:Regular Rate:Normal     Neuro/Psych    GI/Hepatic   Endo/Other    Renal/GU      Musculoskeletal   Abdominal   Peds  Hematology   Anesthesia Other Findings   Reproductive/Obstetrics                             Anesthesia Physical Anesthesia Plan  ASA: IV  Anesthesia Plan: General   Post-op Pain Management:    Induction: Intravenous  PONV Risk Score and Plan: Ondansetron and Dexamethasone  Airway Management Planned: Oral ETT  Additional Equipment: Arterial line, CVP, PA Cath, 3D TEE and Ultrasound Guidance Line Placement  Intra-op Plan:   Post-operative Plan: Post-operative intubation/ventilation  Informed Consent: I have reviewed the patients History and Physical, chart, labs and discussed the procedure including the risks, benefits and alternatives for the proposed anesthesia with the patient or authorized representative who has indicated his/her understanding and acceptance.       Plan Discussed with: CRNA and Anesthesiologist  Anesthesia Plan Comments:         Anesthesia Quick Evaluation

## 2020-01-28 ENCOUNTER — Inpatient Hospital Stay (HOSPITAL_COMMUNITY): Payer: PPO

## 2020-01-28 LAB — POCT I-STAT 7, (LYTES, BLD GAS, ICA,H+H)
Acid-base deficit: 7 mmol/L — ABNORMAL HIGH (ref 0.0–2.0)
Acid-base deficit: 1 mmol/L (ref 0.0–2.0)
Acid-base deficit: 2 mmol/L (ref 0.0–2.0)
Bicarbonate: 19 mmol/L — ABNORMAL LOW (ref 20.0–28.0)
Bicarbonate: 23.6 mmol/L (ref 20.0–28.0)
Bicarbonate: 23.9 mmol/L (ref 20.0–28.0)
Calcium, Ion: 1.19 mmol/L (ref 1.15–1.40)
Calcium, Ion: 1.4 mmol/L (ref 1.15–1.40)
Calcium, Ion: 1.42 mmol/L — ABNORMAL HIGH (ref 1.15–1.40)
HCT: 31 % — ABNORMAL LOW (ref 36.0–46.0)
HCT: 31 % — ABNORMAL LOW (ref 36.0–46.0)
HCT: 31 % — ABNORMAL LOW (ref 36.0–46.0)
Hemoglobin: 10.5 g/dL — ABNORMAL LOW (ref 12.0–15.0)
Hemoglobin: 10.5 g/dL — ABNORMAL LOW (ref 12.0–15.0)
Hemoglobin: 10.5 g/dL — ABNORMAL LOW (ref 12.0–15.0)
O2 Saturation: 99 %
O2 Saturation: 99 %
O2 Saturation: 97 %
Patient temperature: 34.7
Patient temperature: 35.8
Patient temperature: 36.1
Potassium: 4 mmol/L (ref 3.5–5.1)
Potassium: 4 mmol/L (ref 3.5–5.1)
Potassium: 4 mmol/L (ref 3.5–5.1)
Sodium: 136 mmol/L (ref 135–145)
Sodium: 143 mmol/L (ref 135–145)
Sodium: 142 mmol/L (ref 135–145)
TCO2: 20 mmol/L — ABNORMAL LOW (ref 22–32)
TCO2: 25 mmol/L (ref 22–32)
TCO2: 25 mmol/L (ref 22–32)
pCO2 arterial: 33.6 mmHg (ref 32.0–48.0)
pCO2 arterial: 37.4 mmHg (ref 32.0–48.0)
pCO2 arterial: 42.2 mmHg (ref 32.0–48.0)
pH, Arterial: 7.35 (ref 7.350–7.450)
pH, Arterial: 7.402 (ref 7.350–7.450)
pH, Arterial: 7.357 (ref 7.350–7.450)
pO2, Arterial: 115 mmHg — ABNORMAL HIGH (ref 83.0–108.0)
pO2, Arterial: 126 mmHg — ABNORMAL HIGH (ref 83.0–108.0)
pO2, Arterial: 87 mmHg (ref 83.0–108.0)

## 2020-01-28 LAB — GLUCOSE, CAPILLARY
Glucose-Capillary: 119 mg/dL — ABNORMAL HIGH (ref 70–99)
Glucose-Capillary: 131 mg/dL — ABNORMAL HIGH (ref 70–99)
Glucose-Capillary: 134 mg/dL — ABNORMAL HIGH (ref 70–99)
Glucose-Capillary: 135 mg/dL — ABNORMAL HIGH (ref 70–99)
Glucose-Capillary: 141 mg/dL — ABNORMAL HIGH (ref 70–99)
Glucose-Capillary: 143 mg/dL — ABNORMAL HIGH (ref 70–99)
Glucose-Capillary: 143 mg/dL — ABNORMAL HIGH (ref 70–99)
Glucose-Capillary: 144 mg/dL — ABNORMAL HIGH (ref 70–99)
Glucose-Capillary: 145 mg/dL — ABNORMAL HIGH (ref 70–99)
Glucose-Capillary: 145 mg/dL — ABNORMAL HIGH (ref 70–99)
Glucose-Capillary: 153 mg/dL — ABNORMAL HIGH (ref 70–99)
Glucose-Capillary: 157 mg/dL — ABNORMAL HIGH (ref 70–99)

## 2020-01-28 LAB — CBC
HCT: 32.2 % — ABNORMAL LOW (ref 36.0–46.0)
HCT: 33.2 % — ABNORMAL LOW (ref 36.0–46.0)
Hemoglobin: 10.9 g/dL — ABNORMAL LOW (ref 12.0–15.0)
Hemoglobin: 11.3 g/dL — ABNORMAL LOW (ref 12.0–15.0)
MCH: 29.9 pg (ref 26.0–34.0)
MCH: 30.2 pg (ref 26.0–34.0)
MCHC: 33.9 g/dL (ref 30.0–36.0)
MCHC: 34 g/dL (ref 30.0–36.0)
MCV: 88.5 fL (ref 80.0–100.0)
MCV: 88.8 fL (ref 80.0–100.0)
Platelets: 153 10*3/uL (ref 150–400)
Platelets: 158 10*3/uL (ref 150–400)
RBC: 3.64 MIL/uL — ABNORMAL LOW (ref 3.87–5.11)
RBC: 3.74 MIL/uL — ABNORMAL LOW (ref 3.87–5.11)
RDW: 14.1 % (ref 11.5–15.5)
RDW: 14.6 % (ref 11.5–15.5)
WBC: 9.7 10*3/uL (ref 4.0–10.5)
WBC: 12.3 10*3/uL — ABNORMAL HIGH (ref 4.0–10.5)
nRBC: 0 % (ref 0.0–0.2)
nRBC: 0 % (ref 0.0–0.2)

## 2020-01-28 LAB — BASIC METABOLIC PANEL
Anion gap: 8 (ref 5–15)
Anion gap: 7 (ref 5–15)
BUN: 12 mg/dL (ref 8–23)
BUN: 13 mg/dL (ref 8–23)
CO2: 24 mmol/L (ref 22–32)
CO2: 21 mmol/L — ABNORMAL LOW (ref 22–32)
Calcium: 8.8 mg/dL — ABNORMAL LOW (ref 8.9–10.3)
Calcium: 9.6 mg/dL (ref 8.9–10.3)
Chloride: 110 mmol/L (ref 98–111)
Chloride: 110 mmol/L (ref 98–111)
Creatinine, Ser: 0.8 mg/dL (ref 0.44–1.00)
Creatinine, Ser: 0.89 mg/dL (ref 0.44–1.00)
GFR calc Af Amer: >60 mL/min (ref >60)
GFR calc Af Amer: >60 mL/min (ref >60)
GFR calc non Af Amer: >60 mL/min (ref >60)
GFR calc non Af Amer: >60 mL/min (ref >60)
Glucose, Bld: 159 mg/dL — ABNORMAL HIGH (ref 70–99)
Glucose, Bld: 124 mg/dL — ABNORMAL HIGH (ref 70–99)
Potassium: 3.4 mmol/L — ABNORMAL LOW (ref 3.5–5.1)
Potassium: 3.8 mmol/L (ref 3.5–5.1)
Sodium: 142 mmol/L (ref 135–145)
Sodium: 138 mmol/L (ref 135–145)

## 2020-01-28 LAB — BPAM PLATELET PHERESIS
Blood Product Expiration Date: 202103132044
ISSUE DATE / TIME: 202103122112
Unit Type and Rh: 6200

## 2020-01-28 LAB — PROTIME-INR
INR: 1.1 (ref 0.8–1.2)
Prothrombin Time: 14.4 seconds (ref 11.4–15.2)

## 2020-01-28 LAB — APTT: aPTT: 30 seconds (ref 24–36)

## 2020-01-28 LAB — ECHO INTRAOPERATIVE TEE
Height: 63 in
Weight: 2304 oz

## 2020-01-28 LAB — PREPARE PLATELET PHERESIS: Unit division: 0

## 2020-01-28 LAB — MAGNESIUM
Magnesium: 2.9 mg/dL — ABNORMAL HIGH (ref 1.7–2.4)
Magnesium: 2.4 mg/dL (ref 1.7–2.4)

## 2020-01-28 LAB — FIBRINOGEN: Fibrinogen: 400 mg/dL (ref 210–475)

## 2020-01-28 MED ORDER — ASPIRIN 300 MG RE SUPP
300.0000 mg | Freq: Once | RECTAL | Status: AC
  Administered 2020-01-28: 300 mg via RECTAL
  Filled 2020-01-28: qty 1

## 2020-01-28 MED ORDER — INSULIN ASPART 100 UNIT/ML ~~LOC~~ SOLN
0.0000 [IU] | SUBCUTANEOUS | Status: DC
  Administered 2020-01-28 – 2020-02-02 (×14): 2 [IU] via SUBCUTANEOUS
  Administered 2020-02-02: 4 [IU] via SUBCUTANEOUS
  Administered 2020-02-03 (×2): 2 [IU] via SUBCUTANEOUS
  Administered 2020-02-03: 4 [IU] via SUBCUTANEOUS
  Administered 2020-02-03 – 2020-02-17 (×31): 2 [IU] via SUBCUTANEOUS

## 2020-01-28 MED ORDER — CHLORHEXIDINE GLUCONATE CLOTH 2 % EX PADS
6.0000 | MEDICATED_PAD | Freq: Every day | CUTANEOUS | Status: DC
  Administered 2020-01-29 – 2020-02-21 (×23): 6 via TOPICAL

## 2020-01-28 MED ORDER — ORAL CARE MOUTH RINSE
15.0000 mL | Freq: Two times a day (BID) | OROMUCOSAL | Status: DC
  Administered 2020-01-29 (×2): 15 mL via OROMUCOSAL

## 2020-01-28 MED ORDER — ORAL CARE MOUTH RINSE
15.0000 mL | OROMUCOSAL | Status: DC
  Administered 2020-01-28 (×2): 15 mL via OROMUCOSAL

## 2020-01-28 MED ORDER — POTASSIUM CHLORIDE 10 MEQ/50ML IV SOLN
10.0000 meq | INTRAVENOUS | Status: AC
  Administered 2020-01-28 (×3): 10 meq via INTRAVENOUS

## 2020-01-28 MED ORDER — CLOPIDOGREL BISULFATE 75 MG PO TABS
75.0000 mg | ORAL_TABLET | Freq: Every day | ORAL | Status: DC
  Administered 2020-01-28: 75 mg via ORAL
  Filled 2020-01-28: qty 1

## 2020-01-28 MED ORDER — SODIUM CHLORIDE 0.9% FLUSH
10.0000 mL | Freq: Two times a day (BID) | INTRAVENOUS | Status: DC
  Administered 2020-01-28: 30 mL
  Administered 2020-01-29 – 2020-01-30 (×3): 10 mL
  Administered 2020-01-30: 20 mL
  Administered 2020-01-31 – 2020-02-02 (×5): 10 mL

## 2020-01-28 MED ORDER — SODIUM CHLORIDE 0.9% FLUSH: 10.0000 mL | INTRAVENOUS | Status: DC | PRN

## 2020-01-28 MED ORDER — CHLORHEXIDINE GLUCONATE 0.12 % MT SOLN
15.0000 mL | Freq: Two times a day (BID) | OROMUCOSAL | Status: DC
  Administered 2020-01-29 (×2): 15 mL via OROMUCOSAL
  Filled 2020-01-28 (×2): qty 15

## 2020-01-28 MED ORDER — CHLORHEXIDINE GLUCONATE 0.12% ORAL RINSE (MEDLINE KIT): 15.0000 mL | Freq: Two times a day (BID) | OROMUCOSAL | Status: DC

## 2020-01-28 MED ORDER — CHLORHEXIDINE GLUCONATE CLOTH 2 % EX PADS: 6.0000 | MEDICATED_PAD | Freq: Every day | CUTANEOUS | Status: DC

## 2020-01-28 NOTE — Procedures (Signed)
Extubation Procedure Note  Patient Details:   Name: [name] [name] DOB: 1941-07-06 MRN: [id number]   Airway Documentation:    Vent end date: 01/28/20 Vent end time: 1250   Evaluation  O2 sats: stable throughout Complications: No apparent complications Patient did tolerate procedure well. Bilateral Breath Sounds: Clear   Yes   Patient extubated to 4L nasal cannula per MD order.  Positive cuff leak noted.  No evidence of stridor.  Patient able to speak post extubation.  Sats currently 98% and vitals are stable.  NIF of -28, VC of 1.0L.  No complications noted.    Judith Part 01/28/2020, 12:55 PM

## 2020-01-28 NOTE — Progress Notes (Signed)
1 Day Post-Op Procedure(s) (LRB): CORONARY ARTERY BYPASS GRAFTING (CABG) x 4 using LIMA to LAD; RIMA to OM1; Left radia sequential PDA and Left PL. (N/A) RADIAL ARTERY HARVEST (OPEN HARVEST) (Left) TRANSESOPHAGEAL ECHOCARDIOGRAM (TEE) (N/A) INDOCYANINE GREEN FLUORESCENCE IMAGING (ICG) (N/A) Subjective: Intubated, sedated Objective: Vital signs in last 24 hours: Temp:  [94.5 F (34.7 C)-96.8 F (36 C)] 96.1 F (35.6 C) (03/13 0320) Pulse Rate:  [80-82] 82 (03/13 0320) Cardiac Rhythm: Normal sinus rhythm (03/13 0300) Resp:  [11-18] 11 (03/13 0320) BP: (99-135)/(48-80) 127/73 (03/13 0320) SpO2:  [98 %] 98 % (03/12 2000) Arterial Line BP: (109)/(58) 109/58 (03/12 2000) FiO2 (%):  [50 %] 50 % (03/13 0409) Weight:  [65.3 kg-70.8 kg] 70.8 kg (03/13 0500)  Hemodynamic parameters for last 24 hours: PAP: (22)/(7) 22/7 CO:  [3.8 L/min] 3.8 L/min CI:  [2.3 L/min/m2] 2.3 L/min/m2  Intake/Output from previous day: 03/12 0701 - 03/13 0700 In: 3899.8 [I.V.:1390.7; Blood:1971; IV Piggyback:538.1] Out: [id number] [Urine:2415; Blood:418; Chest Tube:1466] Intake/Output this shift: No intake/output data recorded.  General appearance: sedated, on ventilator Neurologic: intact Heart: regular rate and rhythm, S1, S2 normal, no murmur, click, rub or gallop Lungs: clear to auscultation bilaterally Abdomen: soft, non-tender; bowel sounds normal; no masses,  no organomegaly Extremities: extremities normal, atraumatic, no cyanosis or edema Wound: dressed, dry  Lab Results: Recent Labs    01/27/20 0524 01/27/20 1448 01/27/20 1745 01/27/20 1755 01/27/20 1843 01/27/20 2048  WBC 7.8  --   --   --   --  10.8*  HGB 12.5   < > 6.9*   < > 7.1* 11.0*  HCT 38.0   < > 21.3*   < > 21.0* 33.6*  PLT 230  --  138*  --   --  137*   < > = values in this interval not displayed.   BMET:  Recent Labs    01/27/20 0524 01/27/20 1448 01/27/20 1755 01/27/20 1755 01/27/20 1836 01/27/20 1843  NA 138   < > 135    < > 135 136  K 3.8   < > 5.2*   < > 4.5 4.4  CL 105   < > 104  --   --  103  CO2 21*  --   --   --   --   --   GLUCOSE 98   < > 120*  --   --  106*  BUN 14   < > 12  --   --  13  CREATININE 0.86   < > 0.60  --   --  0.50  CALCIUM 10.1  --   --   --   --   --    < > = values in this interval not displayed.    PT/INR:  Recent Labs    01/27/20 2048  LABPROT 17.6*  INR 1.5*   ABG    Component Value Date/Time   PHART 7.428 01/27/2020 1836   HCO3 22.7 01/27/2020 1836   TCO2 24 01/27/2020 1843   ACIDBASEDEF 2.0 01/27/2020 1836   O2SAT 100.0 01/27/2020 1836   CBG (last 3)  Recent Labs    01/28/20 0204 01/28/20 0423 01/28/20 0620  GLUCAP 141* 157* 153*    Assessment/Plan: S/P Procedure(s) (LRB): CORONARY ARTERY BYPASS GRAFTING (CABG) x 4 using LIMA to LAD; RIMA to OM1; Left radia sequential PDA and Left PL. (N/A) RADIAL ARTERY HARVEST (OPEN HARVEST) (Left) TRANSESOPHAGEAL ECHOCARDIOGRAM (TEE) (N/A) INDOCYANINE GREEN FLUORESCENCE IMAGING (ICG) (N/A) f/u labs this am  Extubate after labs.   LOS: 2 days    [name] 01/28/2020

## 2020-01-28 NOTE — Op Note (Signed)
CARDIOTHORACIC SURGERY OPERATIVE NOTE  Date of Procedure: 01/28/2020  Preoperative Diagnosis: Severe 3-vessel Coronary Artery Disease including LM CAD  Postoperative Diagnosis: Same  Procedure:    Coronary Artery Bypass Grafting x 4  Left Internal Mammary Artery to Distal Left Anterior Descending Coronary Artery; left radial artery Graft to right Posterior Descending Coronary Artery and left posterolateral coronary artery as a sequenced graft; Pedicled RIMA to 1st Obtuse Marginal Branch of Left Circumflex Coronary Artery; open left radial artery Harvest Bilateral internal mammary artery harvesting Completion graft surveillance with indocyanine green fluorescence imaging Multilevel rib block with Exparel solution  Surgeon: B.  Murvin Natal, MD  Assistant: Macarthur Critchley PA-C  Anesthesia: Neuro  Operative Findings:  Preserved left ventricular systolic function  Good quality  mammary artery conduits  Good quality left radial artery conduit  Good quality target vessels for grafting    BRIEF CLINICAL NOTE AND INDICATIONS FOR SURGERY  79 year old lady began having pressure in the chest last summer.  This progressed in frequency and severity prompting a work-up recently that culminated in left heart catheterization.  She was found to have left main disease and a codominant coronary circulation.  She was taken to the operating room for CABG.   DETAILS OF THE OPERATIVE PROCEDURE  Preparation:  The patient is brought to the operating room on the above mentioned date and central monitoring was established by the anesthesia team including placement of Swan-Ganz catheter and radial arterial line. The patient is placed in the supine position on the operating table.  Intravenous antibiotics are administered. General endotracheal anesthesia is induced uneventfully. A Foley catheter is placed.  Baseline transesophageal echocardiogram was performed.  Findings were notable for preserved LV  function and no valve disease.  The patient's chest, abdomen, both groins, left upper extremity and both lower extremities are prepared and draped in a sterile manner. A time out procedure is performed.   Surgical Approach and Conduit Harvest:  A median sternotomy incision was performed and the left internal mammary artery is dissected from the chest wall and prepared for bypass grafting. The left internal mammary artery is notably good quality conduit. Simultaneously, the left radial artery is obtained fusing been harvest technique. After removal of the graft, the small surgical incisions in the upper extremity are closed with absorbable suture.  The arm is then tucked at the left side.  Attention is turned to the right hemithorax where the right internal mammary artery is mobilized in a standard fashion.  Following systemic heparinization, both mammary arteries were transected distally noted to have excellent flow.  Grafts were treated with a solution of papaverine.  Multilevel rib block is created on each side of the sternum by injecting Exparel directly into the rib spaces.   Extracorporeal Cardiopulmonary Bypass and Myocardial Protection:  The pericardium is opened. The ascending aorta is nondiseased in appearance. The ascending aorta and the right atrium are cannulated for cardiopulmonary bypass.  Adequate heparinization is verified.   The entire pre-bypass portion of the operation was notable for stable hemodynamics.  Cardiopulmonary bypass was begun and the surface of the heart is inspected. Distal target vessels are selected for coronary artery bypass grafting. A cardioplegia cannula is placed in the ascending aorta.    The patient is allowed to cool passively to34 C systemic temperature.  The aortic cross clamp is applied and cold blood cardioplegia is delivered initially in an antegrade fashion through the aortic root.  Iced saline slush is applied for topical hypothermia.  The initial  cardioplegic arrest is rapid with early diastolic arrest.  Repeat doses of cardioplegia are administered intermittently throughout the entire cross clamp portion of the operation through the aortic root, and through subsequently placed  grafts in order to maintain completely flat electrocardiogram.   Coronary Artery Bypass Grafting:   The  posterior descending branch of the right coronary artery and the posterolateral branch of the co-dominant Left coronary artery were grafted using the left radial artery graft in a side to side and end-to-side fashion, respectively to create a sequenced graft configuration.  At the site of distal anastomosis the target vessels was good quality and measured approximately 1.5 mm in diameter. Anastomotic patency and runoff was confirmed with indocyanine green fluorescence imaging (SPY).  The first obtuse marginal branch of the left circumflex coronary artery was grafted using the pedicled right internal mammary artery which was brought underneath the ascending aorta through the transverse sinus.    At the site of distal anastomosis the target vessel was good quality and measured approximately 1.5 mm in diameter. Anastomotic patency and runoff was confirmed with indocyanine green fluorescence imaging (SPY).    The distal left anterior coronary artery was grafted with the left internal mammary artery in an end-to-side fashion.  At the site of distal anastomosis the target vessel was good quality and measured approximately 1.5 mm in diameter.Anastomotic patency and runoff was confirmed with indocyanine green fluorescence imaging (SPY).  The proximal artery graft anastomosis was placed directly to the ascending aorta prior to removal of the aortic cross clamp.  Deairing procedures were performed and the aortic cross-clamp was removed   Procedure Completion:  All proximal and distal coronary anastomoses were inspected for hemostasis and appropriate graft orientation.  Epicardial pacing wires are fixed to the right ventricular outflow tract and to the right atrial appendage. The patient is rewarmed to 37C temperature. The patient is weaned and disconnected from cardiopulmonary bypass.  The patient's rhythm at separation from bypass was NSR.  The patient was weaned from cardiopulmonary bypass  without any inotropic support.  Followup transesophageal echocardiogram performed after separation from bypass revealed  no changes from the preoperative exam.  The aortic and venous cannula were removed uneventfully. Protamine was administered to reverse the anticoagulation. The mediastinum and pleural space were inspected for hemostasis and irrigated with saline solution. The mediastinum and both pleural spaces were drained using fluted chest tubes placed through separate stab incisions inferiorly.  The soft tissues anterior to the aorta were reapproximated loosely. The sternum is closed with double strength sternal wire. The soft tissues anterior to the sternum were closed in multiple layers and the skin is closed with a running subcuticular skin closure.  The post-bypass portion of the operation was notable for stable rhythm and hemodynamics.     Disposition:  The patient tolerated the procedure well and is transported to the surgical intensive care in stable condition. There are no intraoperative complications. All sponge instrument and needle counts are verified correct at completion of the operation.    Jayme Cloud, MD 01/28/2020 4:03 PM

## 2020-01-29 ENCOUNTER — Inpatient Hospital Stay (HOSPITAL_COMMUNITY): Payer: PPO

## 2020-01-29 LAB — BPAM RBC
Blood Product Expiration Date: 202104102359
Blood Product Expiration Date: 202104112359
Blood Product Expiration Date: 202104112359
Blood Product Expiration Date: 202104112359
ISSUE DATE / TIME: 202103121817
ISSUE DATE / TIME: 202103121817
ISSUE DATE / TIME: 202103122112
ISSUE DATE / TIME: 202103130431
Unit Type and Rh: 6200
Unit Type and Rh: 6200
Unit Type and Rh: 6200
Unit Type and Rh: 6200

## 2020-01-29 LAB — BPAM FFP
Blood Product Expiration Date: 202103172359
Blood Product Expiration Date: 202103172359
ISSUE DATE / TIME: 202103130232
ISSUE DATE / TIME: 202103130232
Unit Type and Rh: 600
Unit Type and Rh: 6200

## 2020-01-29 LAB — CBC
HCT: 33.6 % — ABNORMAL LOW (ref 36.0–46.0)
Hemoglobin: 11.1 g/dL — ABNORMAL LOW (ref 12.0–15.0)
MCH: 30 pg (ref 26.0–34.0)
MCHC: 33 g/dL (ref 30.0–36.0)
MCV: 90.8 fL (ref 80.0–100.0)
Platelets: 184 10*3/uL (ref 150–400)
RBC: 3.7 MIL/uL — ABNORMAL LOW (ref 3.87–5.11)
RDW: 14.8 % (ref 11.5–15.5)
WBC: 15.6 10*3/uL — ABNORMAL HIGH (ref 4.0–10.5)
nRBC: 0 % (ref 0.0–0.2)

## 2020-01-29 LAB — BPAM CRYOPRECIPITATE
Blood Product Expiration Date: 202103130435
Blood Product Expiration Date: 202103130435
ISSUE DATE / TIME: 202103130000
ISSUE DATE / TIME: 202103130000
Unit Type and Rh: 6200
Unit Type and Rh: 6200

## 2020-01-29 LAB — TYPE AND SCREEN
ABO/RH(D): A POS
Antibody Screen: NEGATIVE
Unit division: 0
Unit division: 0
Unit division: 0
Unit division: 0

## 2020-01-29 LAB — PREPARE CRYOPRECIPITATE
Unit division: 0
Unit division: 0

## 2020-01-29 LAB — BASIC METABOLIC PANEL
Anion gap: 7 (ref 5–15)
BUN: 14 mg/dL (ref 8–23)
CO2: 22 mmol/L (ref 22–32)
Calcium: 9.8 mg/dL (ref 8.9–10.3)
Chloride: 109 mmol/L (ref 98–111)
Creatinine, Ser: 1 mg/dL (ref 0.44–1.00)
Glucose, Bld: 129 mg/dL — ABNORMAL HIGH (ref 70–99)
Potassium: 4.3 mmol/L (ref 3.5–5.1)
Sodium: 138 mmol/L (ref 135–145)

## 2020-01-29 LAB — PREPARE FRESH FROZEN PLASMA

## 2020-01-29 LAB — GLUCOSE, CAPILLARY
Glucose-Capillary: 119 mg/dL — ABNORMAL HIGH (ref 70–99)
Glucose-Capillary: 119 mg/dL — ABNORMAL HIGH (ref 70–99)
Glucose-Capillary: 124 mg/dL — ABNORMAL HIGH (ref 70–99)
Glucose-Capillary: 125 mg/dL — ABNORMAL HIGH (ref 70–99)
Glucose-Capillary: 127 mg/dL — ABNORMAL HIGH (ref 70–99)
Glucose-Capillary: 130 mg/dL — ABNORMAL HIGH (ref 70–99)
Glucose-Capillary: 130 mg/dL — ABNORMAL HIGH (ref 70–99)

## 2020-01-29 MED ORDER — LEVOTHYROXINE SODIUM 200 MCG PO TABS
200.0000 ug | ORAL_TABLET | ORAL | Status: DC
  Filled 2020-01-29: qty 1

## 2020-01-29 MED ORDER — FUROSEMIDE 10 MG/ML IJ SOLN
40.0000 mg | Freq: Two times a day (BID) | INTRAMUSCULAR | Status: DC
  Administered 2020-01-29 (×2): 40 mg via INTRAVENOUS
  Filled 2020-01-29 (×2): qty 4

## 2020-01-29 MED ORDER — ACETAMINOPHEN 10 MG/ML IV SOLN
1000.0000 mg | Freq: Four times a day (QID) | INTRAVENOUS | Status: AC
  Administered 2020-01-29 (×3): 1000 mg via INTRAVENOUS
  Filled 2020-01-29 (×3): qty 100

## 2020-01-29 MED ORDER — ASPIRIN 81 MG PO CHEW
81.0000 mg | CHEWABLE_TABLET | Freq: Every day | ORAL | Status: DC
  Administered 2020-01-29: 81 mg via ORAL
  Filled 2020-01-29: qty 1

## 2020-01-29 MED ORDER — COLCHICINE 0.3 MG HALF TABLET
0.3000 mg | ORAL_TABLET | Freq: Two times a day (BID) | ORAL | Status: DC
  Administered 2020-01-29 (×2): 0.3 mg via ORAL
  Filled 2020-01-29 (×3): qty 1

## 2020-01-29 MED ORDER — LEVOTHYROXINE SODIUM 200 MCG PO TABS
100.0000 ug | ORAL_TABLET | ORAL | Status: DC
  Filled 2020-01-29 (×2): qty 0.5

## 2020-01-29 MED ORDER — ISOSORBIDE DINITRATE 10 MG PO TABS
10.0000 mg | ORAL_TABLET | Freq: Three times a day (TID) | ORAL | Status: DC
  Administered 2020-01-29 (×3): 10 mg via ORAL
  Filled 2020-01-29 (×4): qty 1

## 2020-01-29 MED ORDER — CLOPIDOGREL BISULFATE 75 MG PO TABS
75.0000 mg | ORAL_TABLET | Freq: Every day | ORAL | Status: DC
  Administered 2020-01-29: 75 mg via ORAL
  Filled 2020-01-29: qty 1

## 2020-01-29 MED ORDER — METOCLOPRAMIDE HCL 5 MG/ML IJ SOLN
5.0000 mg | Freq: Three times a day (TID) | INTRAMUSCULAR | Status: AC
  Administered 2020-01-29 (×2): 5 mg via INTRAVENOUS
  Filled 2020-01-29 (×2): qty 2

## 2020-01-29 NOTE — Anesthesia Postprocedure Evaluation (Signed)
Anesthesia Post Note  Patient: [name] [name]  Procedure(s) Performed: CORONARY ARTERY BYPASS GRAFTING (CABG) x 4 using LIMA to LAD; RIMA to OM1; Left radia sequential PDA and Left PL. (N/A Chest) RADIAL ARTERY HARVEST (OPEN HARVEST) (Left Arm Lower) TRANSESOPHAGEAL ECHOCARDIOGRAM (TEE) (N/A ) INDOCYANINE GREEN FLUORESCENCE IMAGING (ICG) (N/A )     Patient location during evaluation: SICU Anesthesia Type: General Level of consciousness: sedated Pain management: pain level controlled Vital Signs Assessment: post-procedure vital signs reviewed and stable Respiratory status: patient remains intubated per anesthesia plan Cardiovascular status: stable Postop Assessment: no apparent nausea or vomiting Anesthetic complications: no    Last Vitals:  Vitals:   01/29/20 0600 01/29/20 0700  BP: (!) 170/81 (!) 146/76  Pulse: 82 70  Resp: 15 15  Temp:    SpO2: 94% 99%    Last Pain:  Vitals:   01/29/20 0628  TempSrc:   PainSc: Asleep                 Jammie Troup

## 2020-01-29 NOTE — Discharge Summary (Signed)
Physician Discharge Summary  Patient ID: [name] [name] MRN: [id number] DOB/AGE: 02/08/1941 79 y.o.  Admit date: 01/26/2020 Discharge date: 02/21/2020  Admission Diagnoses: Stable angina pectoris Multivessel coronary artery disease Hypertension Dyslipidemia History of thyroid cancer, s/p total thyroidectomy Hypothyroidism Bilateral 40 to 59% carotid artery stenoses, asymptomatic   Discharge Diagnoses:   Stable angina pectoris Multivessel coronary artery disease Hypertension Dyslipidemia History of thyroid cancer, s/p total thyroidectomy Hypothyroidism Bilateral 40 to 59% carotid artery stenoses, asymptomatic Status post CABG x4 Expected acute blood loss anemia Acute left MCA stroke Bilateral pulmonary emboli  Discharged Condition: good   History of Present Illness:      79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 Sx have been noted since last Summer and have increased in frequency and severity. She has found relief with rest. She underwent stress test which was nonconclusive for ischemia but a CT coronary showed high calcium scoring. This was followed by [facility] yesterday demonstrating severe 3 V CAD including totally occluded RCA and LM CAD. She is stable without pain now. Consult received for CABG. Her PMHx is otherwise notable for cerebrovascular disease, HTN, HLD, and h/o benign brain mass.  She remained stable following the left heart catheterization.  Hospital Course:   Coronary bypass grafting was offered to the patient and she decided to proceed with surgery.  She was taken to the operating room on 01/28/2020 CABG x4 was carried out.  Please see the details below.  Following the procedure, she separated from cardiopulmonary bypass without difficulty and was transferred to the cardiovascular ICU in stable condition.  She remained hemodynamically stable. She was weaned from the ventilator and extubated in the  early afternoon on the day of surgery.  She was mobilized early postoperatively and tolerated this well.  Diet and activity were gradually advanced.  On the morning of post-op day 3 (01/30/20) she developed sudden weakness, dizziness and became non-verbal. A code stroke was initiated and it was determined she had a left left MCA CVA.  She was intubated and taken to the radiology dept where head CT confirmed L MCA CVA. She subsequently cerebral angiography and had successful mechanical thrombectomy. In addition, she was discovered to have bilateral pulmonary emboli. She was heparinized. On 03/16, continuous EEG was ordered and Keppra 750 mg IV bid. Per neurology, EEG showed  evidence of epileptogenicity arising from left frontotemporal as well as cortical dysfunction in left hemisphere likely secondary to underlying stroke. No seizures were seen throughout the recording. CT of head done 03/16 showed no hemorrhage, small anterior L frontal lobe infarct,? L lentiform nucleus infarct. Neurology discussed with Dr. Orvan Seen about holding off on Plavix and ec asa for now. She had a right pleural effusion with chest in place. Per pulmonary, will do bedside u/s but would hold on drainage if needed until off positive pressure as this effusion is not inhibiting vent or oxygenation or even sbt (mental status is).  The patient had an EEG which did not show evidence of seizure like activity.  She was awake and responsive on a vent 3/18 and was extubated;however, she was not able to clear secretions and too weak too remove NIV so patient was re intubated late the afternoon 03/18.  The critical care team elected to proceed with trach on the following day.  Bronchoscopy followed by bedside percutaneous tracheostomy was performed without complication on [id number].  Respiratory status gradually improved thereafter.  The level  of secretions gradually diminished.  She was noted to have considerable expressive aphasia was also unable to  swallow.  She was supported with tube feeding by way of a Cortrack feeding tube and this was well-tolerated as well.  Physical therapy, speech-language pathology, and Occupational Therapy were all involved with her care on a daily basis.  She was transferred to the 4 E. progressive care.  Her progress continued.  Tracheostomy tube was downsized and eventually fitted with Passy-Muir valve.  On her speech improved steadily, she continued to be given some difficulty with word finding.  A modified barium swallow was performed on 02/10/2020 and showed persistent dysfunction with high risk for aspiration.  She was kept n.p.o. except for some ice chips over the next week.  The speech therapist continue to assist her with exercises over the next week.  The modified barium swallow was repeated on 02/17/2020 and demonstrated much improved swallow function. Regular diet was resumed by the  SLP. At that point, shce was converted from [facility] enoxaparin to oral anticoagulation with a NOAC. Other medications were also converte to PO as appropriate.  By this time, ambulation and transfers have improved significantly.  Initial goal was to provide further therapy in the inpatient rehab facility associated with [facility] but when it became apparent that she did not have adequate in-home support following discharge, we adjusted the plan to proceed further rehab in a skilled nursing facility. Per Dr. Orvan Seen, patient may be discharged to SNF.  We ask the SNF to please do the following: 1. Please obtain vital signs at least one time daily 2.Please weigh the patient daily. If he or she continues to gain weight or develops lower extremity edema, contact the office at [phone]. 3. Ambulate patient at least three times daily and please use sternal precautions.  Consults: neurology  Significant Diagnostic Studies:  LEFT HEART CATH AND CORONARY ANGIOGRAPHY  Conclusion   Severe multivessel coronary disease.  Significant  mitral annular calcification  65 to 70% tubular proximal to distal left main.  Pressure damping and ventricularization with engagement using a 5 French catheter.  Proximal to mid 70 to 80% LAD.  It is large distally and a favorable target  Circumflex is codominant in origin to at least 4 obtuse marginal branches.  The first obtuse marginal contains irregularities up to 50% of the proximal segment.  The circumflex proper contains eccentric calcified 75 to 80% narrowing.  The right coronary is codominant, contains ostial disease, and is totally occluded in the mid vessel after the origin of the right ventricular acute marginal branch.  The distal vessel fills by right to right collaterals via an acute marginal branch and also from LAD to acute marginal branch.  PDA and inferior document is small.  Inferobasal hypokinesis.  EF 55%.  LVEDP 18 mmHg.  RECOMMENDATIONS:   After discussion, the decision was made to keep the patient in hospital, have her expediently evaluated by cardiac surgery, and to move forward with coronary bypass grafting as soon as possible.  Continue aspirin.  Add long-acting nitrates for anti-ischemic effect and to help with blood pressure.  Continue beta-blocker therapy.  IV heparin will be added after sheath pull and hemostasis established for at least 8 hours.    *INTRAOPERATIVE TRANSESOPHAGEAL REPORT *      Patient Name:  [name] [name] Date of Exam: 01/27/2020  Medical Rec #: [id number]     Height:    63.0 in  Accession #:  [id number]  Weight:    144.0 lb  Date of Birth: [birth year]     BSA:     1.68 m  Patient Age:  [name] years      BP:      142/56 mmHg  Patient Gender: F         HR:      50 bpm.  Exam Location: Anesthesiology   Transesophogeal exam was perform intraoperatively during surgical  procedure.  Patient was closely monitored under general anesthesia during the entirety  of   examination.   Indications:   Coronary artery disease  Sonographer:   Dustin Flock RDCS  Performing Phys: [id number]  Diagnosing Phys: Roberts Gaudy MD   Complications: No known complications during this procedure.  PRE-OP FINDINGS  Left Ventricle: The LV cavity was normal in size. There was normal LV  systolic function with the ejection fraction estimated at [name]-65%. There  were no regional wall moton abnormalities. There was mild concentric LV  hypertrophy. The PW and antero-septum  each measured 1.10 at end-diastole at the mid-papillary level.   On the post-bypass exam, there was normal LV systolic function with no  regional wall motion abnormalities.   Right Ventricle: The right ventricle has normal systolic function. The  cavity was mild to moderately enlarged. There is no increase in right  ventricular wall thickness.   Left Atrium: Left atrial size was normal in size. The left atrial  appendage is well visualized and there is evidence of thrombus present.   Right Atrium: Right atrial pressure is estimated at 10 mmHg. The right  atrium was enlarged and measured 4.0 cm in the medial-lateral dimension.   Interatrial Septum: There was a patent foramen ovale present at the  supeior aspect of the fossa ovalis. There was continuous left to right  flow.   Pericardium: There is no evidence of pericardial effusion.   Tricuspid Valve: The tricuspid annulus was moderately enlarged and  measured 4.0 cm in the four chamber view. There was moderate tricuspid  insufficiency with a central jet.   Aortic Valve: The aortic valve is tricuspid There is mild calcification of  the aortic valve. Aortic valve regurgitation was not visualized by color  flow Doppler. The aortic valve leaflets were mildly thickened. There was  no aortic insufficiency. The peak  transaortic velocity was 1.9 m/sec. The peak gradient was 15 mm hg and  the mean gradient was 6 mm hg.    Pulmonic Valve: The pulmonic valve was normal in structure, with normal.  Pulmonic valve regurgitation is trivial by color flow Doppler.    Aorta: The ascending aorta was normal in size without dilatation or  effacement. There was mild intimal thickening without protruding  atheromatous disease.   +--------------+--------++  LEFT VENTRICLE      +--------------+--------++  PLAX 2D          +--------------+--------++  LVOT diam:  1.90 cm   +--------------+--------++  LVOT Area:  2.84 cm  +--------------+--------++               +--------------+--------++   +------------------+------------++  AORTIC VALVE           +------------------+------------++  AV Area (Vmax):  1.[name] cm    +------------------+------------++  AV Area (Vmean): 2.11 cm    +------------------+------------++  AV Area (VTI):  1.29 cm    +------------------+------------++  AV Vmax:     191.00 cm/s   +------------------+------------++  AV Vmean:     106.000 cm/s  +------------------+------------++  AV  VTI:      0.495 m     +------------------+------------++  AV Peak Grad:   14.6 mmHg    +------------------+------------++  AV Mean Grad:   6.0 mmHg    +------------------+------------++  LVOT Vmax:    108.00 cm/s   +------------------+------------++  LVOT Vmean:    78.950 cm/s   +------------------+------------++  LVOT VTI:     0.226 m     +------------------+------------++  LVOT/AV VTI ratio:0.46      +------------------+------------++     +--------------+-------+  SHUNTS          +--------------+-------+  Systemic VTI: 0.23 m   +--------------+-------+  Systemic Diam:1.90 cm  +--------------+-------+     Roberts Gaudy MD  Electronically signed by Roberts Gaudy MD  Signature Date/Time: 01/28/2020/11:34:05 AM         Treatments:   CARDIOTHORACIC SURGERY OPERATIVE NOTE  Date of Procedure:    01/28/2020  Preoperative Diagnosis:      Severe 3-vessel Coronary Artery Disease including LM CAD  Postoperative Diagnosis:    Same  Procedure:        Coronary Artery Bypass Grafting x 4             Left Internal Mammary Artery to Distal Left Anterior Descending Coronary Artery; left radial artery Graft to right Posterior Descending Coronary Artery and left posterolateral coronary artery as a sequenced graft; Pedicled RIMA to 1st Obtuse Marginal Branch of Left Circumflex Coronary Artery; open left radial artery Harvest Bilateral internal mammary artery harvesting Completion graft surveillance with indocyanine green fluorescence imaging Multilevel rib block with Exparel solution  Surgeon:        B.  Murvin Natal, MD  Assistant:       Macarthur Critchley PA-C  Anesthesia:    Neuro  Operative Findings: ? Preserved left ventricular systolic function ? Good quality  mammary artery conduits ? Good quality left radial artery conduit ? Good quality target vessels for grafting    BRIEF CLINICAL NOTE AND INDICATIONS FOR SURGERY  79 year old lady began having pressure in the chest last summer.  This progressed in frequency and severity prompting a work-up recently that culminated in left heart catheterization.  She was found to have left main disease and a codominant coronary circulation.  She was taken to the operating room for CABG.    Interventional Radiology Procedure Note 01/30/2020  8:22 AM  PATIENT:  [name] [name]  79 y.o. female  PRE-OPERATIVE DIAGNOSIS:  Stroke - Left M1 occlusion  POST-OPERATIVE DIAGNOSIS:  Stroke - Left M1 occlusion  PROCEDURE: Diagnostic cerebral angiogram + mechanical thrombectomy  SURGEON:  Dr. Pedro Earls  PHYSICIAN ASSISTANT: None          ASSISTANTS: none   ANESTHESIA:   general  EBL:  200 mL   BLOOD  ADMINISTERED:none  DRAINS: none   LOCAL MEDICATIONS USED:  NONE  SPECIMEN:  No Specimen  DISPOSITION OF SPECIMEN:  N/A  COUNTS:  YES  TOURNIQUET:  NO  FINDINGS: There was a proximal left M1/MCA occlusion. Mechanical thrombectomy performed with 6 mm solitaire + aspiration. Total 1 pass with complete recanalization (TICI3). No hemorrhage on post procedural flat panel CT.  DICTATION: .Other Dictation: Dictation Number PACS dictation  PLAN OF CARE: Admit to inpatient   PATIENT DISPOSITION:  ICU - extubated and stable.   02/03/20 Bronchoscopy Procedure Note [name] [name] [id number] [birth year]  Procedure: Bronchoscopy Indications: tracheostomy tube placement    02/03/20 Critical care bedside tracheostomy procedure note  Procedure indication:  [name] [name] status post CABG.  Developed stroke.  Status post thrombectomy.  Urgent reintubation for upper airway stridor.  Significant upper airway edema.  Given difficulty of airway have opted for early tracheostomy..   Procedure description:  A preprocedural timeout was performed.  Patient received fentanyl 50 mcg, Versed 2 mg as premedication.  Patient was anesthetized with 20 mg of etomidate and received 10 mg of vecuronium.  A fiberoptic flexible bronchoscope was inserted through the endotracheal tube.  The patient was prepped with chlorhexidine and draped in the usual fashion.  The tracheal rings were identified by palpation and a midline incision was extended from the suprasternal notch to the base of the cricoid cartilage over approximately 2 cm.  Blunt dissection was then performed down to the pretracheal aponeurosis.  Light from the bronchoscope was identified at the bottom of the incision.  The introducer needle was inserted and was visualized on the sidewall of the trachea and Reep was repositioned back to midline.  The guidewire was passed through the introducer under direct bronchoscopic  visualization.  A one-step dilation was performed to the block skin mark.  A #6 cuffed Shiley tracheostomy was loaded on the the introducer and inserted under direct visualization.  The inner cannula was then inserted and the Ronca scope was brought across to the tracheostomy and confirmed position within the trachea.  The tracheostomy was then secured using 2 stitches on either side.  Skin the protective dressing was placed on the underside of the tracheostomy as per protocol.  There was negligible blood loss.  The patient remained hemodynamically stable throughout.  There was no desaturation.  Procedure was well-tolerated.  Dr. Ina Homes proctored this procedure and was present throughout.  Kipp Brood, MD [facility] ICU Physician [place]  Pager: [phone] Mobile: [phone] After hours: [phone].  02/03/2020, 4:40 PM  Discharge Exam: Blood pressure (!) 139/53, pulse 62, temperature 98.3 F (36.8 C), temperature source Oral, resp. rate 19, height 5\' 3"  (1.6 m), weight 63.3 kg, SpO2 97 %.  General appearance: alert, cooperative and no distress Heart: regular rate and rhythm Lungs: clear to auscultation bilaterally Abdomen: soft, non-tender; bowel sounds normal; no masses,  no organomegaly Extremities: extremities normal, atraumatic, no cyanosis or edema Wound: clean and healing without evidence of infection  Discharge Medications:  Allergies as of 02/21/2020      Reactions   Bee Venom Swelling   Massive swelling   Penicillins Other (See Comments)   ** tolerates Zosyn + cephalosporins Did it involve swelling of the face/tongue/throat, SOB, or low BP? Unknown Did it involve sudden or severe rash/hives, skin peeling, or any reaction on the inside of your mouth or nose? Unknown Did you need to seek medical attention at a hospital or doctor's office? Unknown When did it last happen?1945 If all above answers are "NO", may proceed with cephalosporin  use.   Adhesive [tape] Rash   Morphine And Related Rash   Sulfa Antibiotics Nausea And Vomiting, Rash      Medication List    TAKE these medications   acetaminophen 500 MG tablet Commonly known as: TYLENOL Take 1,000 mg by mouth in the morning and at bedtime.   apixaban 5 MG Tabs tablet Commonly known as: ELIQUIS Take 1 tablet (5 mg total) by mouth 2 (two) times daily.   aspirin EC 81 MG tablet Take 81 mg by mouth at bedtime.   atorvastatin 80 MG tablet Commonly known as: LIPITOR Take 80 mg by mouth daily at  12 noon.   cholecalciferol 1000 units tablet Commonly known as: VITAMIN D Take 1,000 Units by mouth daily at 12 noon.   feeding supplement (ENSURE ENLIVE) Liqd Take 237 mLs by mouth 2 (two) times daily between meals.   fluticasone 50 MCG/ACT nasal spray Commonly known as: FLONASE Place 2 sprays into both nostrils daily.   isosorbide mononitrate 30 MG 24 hr tablet Commonly known as: IMDUR Take 0.5 tablets (15 mg total) by mouth daily.   levothyroxine 200 MCG tablet Commonly known as: SYNTHROID Take 100-200 mcg by mouth See admin instructions. Take 200 mcg  SUN-TUES-THUR-SAT Take 100 mcg tab MON-WED-FRI  DO NOT USE GENERIC   lisinopril 20 MG tablet Commonly known as: ZESTRIL Take 20 mg by mouth daily.   metoprolol tartrate 25 MG tablet Commonly known as: LOPRESSOR Take 1 tablet (25 mg total) by mouth 2 (two) times daily. What changed: when to take this   nitroGLYCERIN 0.4 MG SL tablet Commonly known as: NITROSTAT Place 1 tablet (0.4 mg total) under the tongue every 5 (five) minutes as needed for chest pain.   pantoprazole sodium 40 mg/20 mL Pack Commonly known as: PROTONIX Take 20 mLs (40 mg total) by mouth daily.   prenatal multivitamin Tabs tablet Take 1 tablet by mouth daily at 12 noon.   sertraline 50 MG tablet Commonly known as: ZOLOFT Take 1 tablet (50 mg total) by mouth at bedtime.   vitamin B-12 1000 MCG tablet Commonly known as:  CYANOCOBALAMIN Take 2,500 mcg by mouth daily.      Follow-up Information    de Sindy Messing, Erven Colla, MD Follow up in 1 month(s).   Specialties: Radiology, Interventional Radiology Why: Please follow-up in clinic with Dr. Karenann Cai 1 month after discharge. Our office will call you to set up this appointment. Contact information: [place] [address] [phone]        [name], MD. Go on 02/27/2020.   Specialty: Cardiothoracic Surgery Why: You have an appointment with Dr. Orvan Seen on Monday 02/27/20 at 1:30pm.  Please arrive 30 minutes early for a chest X-ray to be performed by [facility] located on te first floor of the same building.  Contact information: [address] [place] [address] [phone]        Almyra Deforest, [place]. Go on 03/08/2020.   Specialties: Cardiology, Radiology Why:  You have a cardiology follow up appointment with Almyra Deforest, PA-C on Thursday 4.22.21 at 10:45am.  Contact information: [address] [place] [address] [phone]          The patient has been discharged on:   1.Beta Blocker:  Yes [ x  ]                              No   [   ]                              If No, reason:  2.Ace Inhibitor/ARB: Yes [  x ]                                     No  [    ]  If No, reason:  3.Statin:   Yes [ x  ]                  No  [   ]                  If No, reason:  4.Ecasa:  Yes  [ x  ]                  No   [   ]                  If No, reason:     Signed: Erin Barrett, PA-C 02/21/2020, 7:45 AM

## 2020-01-29 NOTE — Progress Notes (Signed)
2 Days Post-Op Procedure(s) (LRB): CORONARY ARTERY BYPASS GRAFTING (CABG) x 4 using LIMA to LAD; RIMA to OM1; Left radia sequential PDA and Left PL. (N/A) RADIAL ARTERY HARVEST (OPEN HARVEST) (Left) TRANSESOPHAGEAL ECHOCARDIOGRAM (TEE) (N/A) INDOCYANINE GREEN FLUORESCENCE IMAGING (ICG) (N/A) Subjective: Thirsty/hungry  Objective: Vital signs in last 24 hours: Temp:  [95.7 F (35.4 C)-98.4 F (36.9 C)] 98.4 F (36.9 C) (03/14 0800) Pulse Rate:  [70-82] 70 (03/14 0700) Cardiac Rhythm: A-V Sequential paced (03/14 0400) Resp:  [6-55] 15 (03/14 0700) BP: (121-171)/(67-121) 146/76 (03/14 0700) SpO2:  [94 %-100 %] 99 % (03/14 0700) Arterial Line BP: (112-245)/(58-126) 145/79 (03/14 0700) FiO2 (%):  [40 %] 40 % (03/13 1219) Weight:  [68.9 kg] 68.9 kg (03/14 0500)  Hemodynamic parameters for last 24 hours: PAP: (15-23)/(6-15) 23/10 CVP:  [3 mmHg-16 mmHg] 5 mmHg  Intake/Output from previous day: 03/13 0701 - 03/14 0700 In: 5476.9 [P.O.:240; I.V.:131.7; Blood:4773.5; IV Piggyback:331.7] Out: 1300 [Urine:760; Chest Tube:540] Intake/Output this shift: No intake/output data recorded.  General appearance: alert and cooperative Neurologic: intact Heart: regular rate and rhythm, S1, S2 normal, no murmur, click, rub or gallop Lungs: clear to auscultation bilaterally Abdomen: soft, non-tender; bowel sounds normal; no masses,  no organomegaly Extremities: extremities normal, atraumatic, no cyanosis or edema Wound: c/d/i  Lab Results: Recent Labs    01/28/20 1829 01/29/20 0354  WBC 12.3* 15.6*  HGB 11.3* 11.1*  HCT 33.2* 33.6*  PLT 158 184   BMET:  Recent Labs    01/28/20 1829 01/29/20 0354  NA 138 138  K 3.8 4.3  CL 110 109  CO2 21* 22  GLUCOSE 124* 129*  BUN 13 14  CREATININE 0.89 1.00  CALCIUM 9.6 9.8    PT/INR:  Recent Labs    01/28/20 0625  LABPROT 14.4  INR 1.1   ABG    Component Value Date/Time   PHART 7.357 01/28/2020 1545   HCO3 23.9 01/28/2020 1545   TCO2 25 01/28/2020 1545   ACIDBASEDEF 2.0 01/28/2020 1545   O2SAT 97.0 01/28/2020 1545   CBG (last 3)  Recent Labs    01/28/20 2359 01/29/20 0357 01/29/20 0819  GLUCAP 125* 119* 130*    Assessment/Plan: S/P Procedure(s) (LRB): CORONARY ARTERY BYPASS GRAFTING (CABG) x 4 using LIMA to LAD; RIMA to OM1; Left radia sequential PDA and Left PL. (N/A) RADIAL ARTERY HARVEST (OPEN HARVEST) (Left) TRANSESOPHAGEAL ECHOCARDIOGRAM (TEE) (N/A) INDOCYANINE GREEN FLUORESCENCE IMAGING (ICG) (N/A) Mobilize Diuresis pain control   LOS: 3 days    [name] [name] 01/29/2020

## 2020-01-30 ENCOUNTER — Inpatient Hospital Stay (HOSPITAL_COMMUNITY): Payer: PPO

## 2020-01-30 ENCOUNTER — Inpatient Hospital Stay (HOSPITAL_COMMUNITY): Payer: PPO | Admitting: Certified Registered"

## 2020-01-30 ENCOUNTER — Encounter (HOSPITAL_COMMUNITY)
Admission: RE | Disposition: A | Discharge status: Skilled Nursing Facility | Payer: Self-pay | Source: Home / Self Care | Attending: Cardiothoracic Surgery

## 2020-01-30 DIAGNOSIS — I2699 Other pulmonary embolism without acute cor pulmonale: Secondary | ICD-10-CM

## 2020-01-30 DIAGNOSIS — I63412 Cerebral infarction due to embolism of left middle cerebral artery: Secondary | ICD-10-CM

## 2020-01-30 DIAGNOSIS — R072 Precordial pain: Secondary | ICD-10-CM

## 2020-01-30 DIAGNOSIS — I6523 Occlusion and stenosis of bilateral carotid arteries: Secondary | ICD-10-CM

## 2020-01-30 DIAGNOSIS — I639 Cerebral infarction, unspecified: Secondary | ICD-10-CM

## 2020-01-30 DIAGNOSIS — I6389 Other cerebral infarction: Secondary | ICD-10-CM

## 2020-01-30 DIAGNOSIS — Q211 Atrial septal defect: Secondary | ICD-10-CM

## 2020-01-30 HISTORY — PX: IR CT HEAD LTD: IMG2386

## 2020-01-30 HISTORY — PX: IR PERCUTANEOUS ART THROMBECTOMY/INFUSION INTRACRANIAL INC DIAG ANGIO: IMG6087

## 2020-01-30 HISTORY — PX: RADIOLOGY WITH ANESTHESIA: SHX6223

## 2020-01-30 LAB — POCT I-STAT 7, (LYTES, BLD GAS, ICA,H+H)
Acid-Base Excess: 1 mmol/L (ref 0.0–2.0)
Bicarbonate: 26 mmol/L (ref 20.0–28.0)
Calcium, Ion: 1.4 mmol/L (ref 1.15–1.40)
HCT: 27 % — ABNORMAL LOW (ref 36.0–46.0)
Hemoglobin: 9.2 g/dL — ABNORMAL LOW (ref 12.0–15.0)
O2 Saturation: 100 %
Patient temperature: 97.5
Potassium: 3.9 mmol/L (ref 3.5–5.1)
Sodium: 138 mmol/L (ref 135–145)
TCO2: 27 mmol/L (ref 22–32)
pCO2 arterial: 43.6 mmHg (ref 32.0–48.0)
pH, Arterial: 7.382 (ref 7.350–7.450)
pO2, Arterial: 235 mmHg — ABNORMAL HIGH (ref 83.0–108.0)

## 2020-01-30 LAB — GLUCOSE, CAPILLARY
Glucose-Capillary: 108 mg/dL — ABNORMAL HIGH (ref 70–99)
Glucose-Capillary: 114 mg/dL — ABNORMAL HIGH (ref 70–99)
Glucose-Capillary: 116 mg/dL — ABNORMAL HIGH (ref 70–99)
Glucose-Capillary: 117 mg/dL — ABNORMAL HIGH (ref 70–99)
Glucose-Capillary: 118 mg/dL — ABNORMAL HIGH (ref 70–99)
Glucose-Capillary: 131 mg/dL — ABNORMAL HIGH (ref 70–99)

## 2020-01-30 LAB — CBC
HCT: 31.1 % — ABNORMAL LOW (ref 36.0–46.0)
Hemoglobin: 10.2 g/dL — ABNORMAL LOW (ref 12.0–15.0)
MCH: 30.2 pg (ref 26.0–34.0)
MCHC: 32.8 g/dL (ref 30.0–36.0)
MCV: 92 fL (ref 80.0–100.0)
Platelets: 165 10*3/uL (ref 150–400)
RBC: 3.38 MIL/uL — ABNORMAL LOW (ref 3.87–5.11)
RDW: 14.6 % (ref 11.5–15.5)
WBC: 13 10*3/uL — ABNORMAL HIGH (ref 4.0–10.5)
nRBC: 0 % (ref 0.0–0.2)

## 2020-01-30 LAB — BASIC METABOLIC PANEL
Anion gap: 8 (ref 5–15)
BUN: 19 mg/dL (ref 8–23)
CO2: 28 mmol/L (ref 22–32)
Calcium: 9.6 mg/dL (ref 8.9–10.3)
Chloride: 101 mmol/L (ref 98–111)
Creatinine, Ser: 1.19 mg/dL — ABNORMAL HIGH (ref 0.44–1.00)
GFR calc Af Amer: 51 mL/min — ABNORMAL LOW (ref >60)
GFR calc non Af Amer: 44 mL/min — ABNORMAL LOW (ref >60)
Glucose, Bld: 112 mg/dL — ABNORMAL HIGH (ref 70–99)
Potassium: 3.8 mmol/L (ref 3.5–5.1)
Sodium: 137 mmol/L (ref 135–145)

## 2020-01-30 LAB — ECHOCARDIOGRAM COMPLETE
Height: 63 in
Weight: 2497.37 oz

## 2020-01-30 LAB — PROTIME-INR
INR: 1.1 (ref 0.8–1.2)
Prothrombin Time: 14.1 seconds (ref 11.4–15.2)

## 2020-01-30 LAB — HEPARIN LEVEL (UNFRACTIONATED): Heparin Unfractionated: 0.15 IU/mL — ABNORMAL LOW (ref 0.30–0.70)

## 2020-01-30 LAB — D-DIMER, QUANTITATIVE: D-Dimer, Quant: 1.25 ug/mL-FEU — ABNORMAL HIGH (ref 0.00–0.50)

## 2020-01-30 LAB — FIBRINOGEN: Fibrinogen: 653 mg/dL — ABNORMAL HIGH (ref 210–475)

## 2020-01-30 SURGERY — IR WITH ANESTHESIA
Anesthesia: General

## 2020-01-30 MED ORDER — ROCURONIUM BROMIDE 50 MG/5ML IV SOSY
PREFILLED_SYRINGE | INTRAVENOUS | Status: DC | PRN
  Administered 2020-01-30: 70 mg via INTRAVENOUS

## 2020-01-30 MED ORDER — POTASSIUM CHLORIDE 10 MEQ/50ML IV SOLN
10.0000 meq | INTRAVENOUS | Status: AC
  Administered 2020-01-30 (×3): 10 meq via INTRAVENOUS
  Filled 2020-01-30 (×3): qty 50

## 2020-01-30 MED ORDER — DEXMEDETOMIDINE HCL IN NACL 200 MCG/50ML IV SOLN
INTRAVENOUS | Status: DC | PRN
  Administered 2020-01-30: .4 ug/kg/h via INTRAVENOUS

## 2020-01-30 MED ORDER — SODIUM CHLORIDE 0.9 % IV SOLN: INTRAVENOUS | Status: DC | PRN

## 2020-01-30 MED ORDER — LEVOTHYROXINE SODIUM 200 MCG PO TABS
200.0000 ug | ORAL_TABLET | ORAL | Status: DC
  Administered 2020-01-31 – 2020-02-16 (×10): 200 ug
  Filled 2020-01-30 (×9): qty 1

## 2020-01-30 MED ORDER — ACETAMINOPHEN 325 MG PO TABS: 650.0000 mg | ORAL_TABLET | ORAL | Status: DC | PRN

## 2020-01-30 MED ORDER — PHENYLEPHRINE HCL-NACL 10-0.9 MG/250ML-% IV SOLN
INTRAVENOUS | Status: DC | PRN
  Administered 2020-01-30: 40 ug/min via INTRAVENOUS

## 2020-01-30 MED ORDER — LEVOTHYROXINE SODIUM 200 MCG PO TABS
100.0000 ug | ORAL_TABLET | ORAL | Status: DC
  Administered 2020-02-01 – 2020-02-17 (×8): 100 ug
  Filled 2020-01-30 (×10): qty 0.5

## 2020-01-30 MED ORDER — LIDOCAINE HCL (CARDIAC) PF 100 MG/5ML IV SOSY
PREFILLED_SYRINGE | INTRAVENOUS | Status: DC | PRN
  Administered 2020-01-30: 80 mg via INTRATRACHEAL

## 2020-01-30 MED ORDER — DEXMEDETOMIDINE HCL IN NACL 200 MCG/50ML IV SOLN
INTRAVENOUS | Status: AC
  Filled 2020-01-30: qty 50

## 2020-01-30 MED ORDER — SUCCINYLCHOLINE CHLORIDE 20 MG/ML IJ SOLN
INTRAMUSCULAR | Status: DC | PRN
  Administered 2020-01-30 (×2): 100 mg via INTRAVENOUS

## 2020-01-30 MED ORDER — TIROFIBAN HCL IN NACL 5-0.9 MG/100ML-% IV SOLN
INTRAVENOUS | Status: AC
  Filled 2020-01-30: qty 100

## 2020-01-30 MED ORDER — DOCUSATE SODIUM 50 MG/5ML PO LIQD
200.0000 mg | Freq: Every day | ORAL | Status: DC
  Administered 2020-01-30 – 2020-02-16 (×11): 200 mg
  Filled 2020-01-30 (×14): qty 20

## 2020-01-30 MED ORDER — STROKE: EARLY STAGES OF RECOVERY BOOK
Freq: Once | Status: DC
  Filled 2020-01-30: qty 1

## 2020-01-30 MED ORDER — PHENYLEPHRINE HCL (PRESSORS) 10 MG/ML IV SOLN: INTRAVENOUS | Status: DC | PRN

## 2020-01-30 MED ORDER — ASPIRIN 81 MG PO CHEW
81.0000 mg | CHEWABLE_TABLET | Freq: Every day | ORAL | Status: DC
  Administered 2020-01-30 – 2020-02-19 (×21): 81 mg
  Filled 2020-01-30 (×21): qty 1

## 2020-01-30 MED ORDER — ASPIRIN 81 MG PO CHEW
CHEWABLE_TABLET | ORAL | Status: AC
  Filled 2020-01-30: qty 1

## 2020-01-30 MED ORDER — CHLORHEXIDINE GLUCONATE 0.12% ORAL RINSE (MEDLINE KIT)
15.0000 mL | Freq: Two times a day (BID) | OROMUCOSAL | Status: DC
  Administered 2020-01-30 – 2020-02-02 (×5): 15 mL via OROMUCOSAL

## 2020-01-30 MED ORDER — IOHEXOL 240 MG/ML SOLN
INTRAMUSCULAR | Status: AC
  Filled 2020-01-30: qty 200

## 2020-01-30 MED ORDER — ORAL CARE MOUTH RINSE
15.0000 mL | OROMUCOSAL | Status: DC
  Administered 2020-01-30 – 2020-02-02 (×26): 15 mL via OROMUCOSAL

## 2020-01-30 MED ORDER — PROPOFOL 10 MG/ML IV BOLUS
INTRAVENOUS | Status: DC | PRN
  Administered 2020-01-30: 120 mg via INTRAVENOUS
  Administered 2020-01-30: 100 mg via INTRAVENOUS

## 2020-01-30 MED ORDER — LACTATED RINGERS IV SOLN: INTRAVENOUS | Status: DC | PRN

## 2020-01-30 MED ORDER — FENTANYL CITRATE (PF) 100 MCG/2ML IJ SOLN
INTRAMUSCULAR | Status: DC | PRN
  Administered 2020-01-30: 75 ug via INTRAVENOUS

## 2020-01-30 MED ORDER — ACETAMINOPHEN 160 MG/5ML PO SOLN
650.0000 mg | ORAL | Status: DC | PRN
  Administered 2020-01-30: 650 mg
  Filled 2020-01-30: qty 20.3

## 2020-01-30 MED ORDER — ATORVASTATIN CALCIUM 80 MG PO TABS
80.0000 mg | ORAL_TABLET | Freq: Every day | ORAL | Status: DC
  Administered 2020-01-30 – 2020-02-18 (×21): 80 mg
  Filled 2020-01-30 (×21): qty 1

## 2020-01-30 MED ORDER — TICAGRELOR 90 MG PO TABS
ORAL_TABLET | ORAL | Status: AC
  Filled 2020-01-30: qty 2

## 2020-01-30 MED ORDER — CLEVIDIPINE BUTYRATE 0.5 MG/ML IV EMUL
INTRAVENOUS | Status: DC | PRN
  Administered 2020-01-30: 2 mg/h via INTRAVENOUS

## 2020-01-30 MED ORDER — VERAPAMIL HCL 2.5 MG/ML IV SOLN
INTRAVENOUS | Status: DC | PRN
  Administered 2020-01-30: 5 mg via INTRA_ARTERIAL

## 2020-01-30 MED ORDER — CLEVIDIPINE BUTYRATE 0.5 MG/ML IV EMUL
INTRAVENOUS | Status: AC
  Filled 2020-01-30: qty 50

## 2020-01-30 MED ORDER — FUROSEMIDE 10 MG/ML IJ SOLN
20.0000 mg | Freq: Once | INTRAMUSCULAR | Status: AC
  Administered 2020-01-30: 20 mg via INTRAVENOUS
  Filled 2020-01-30: qty 2

## 2020-01-30 MED ORDER — NICARDIPINE HCL IN NACL 20-0.86 MG/200ML-% IV SOLN
0.0000 mg/h | INTRAVENOUS | Status: DC
  Administered 2020-01-30: 7.5 mg/h via INTRAVENOUS
  Administered 2020-01-30 (×2): 5 mg/h via INTRAVENOUS
  Administered 2020-01-30: 7.5 mg/h via INTRAVENOUS
  Administered 2020-01-31: 5 mg/h via INTRAVENOUS
  Administered 2020-01-31: 2.5 mg/h via INTRAVENOUS
  Administered 2020-01-31: 5 mg/h via INTRAVENOUS
  Administered 2020-01-31: 2.5 mg/h via INTRAVENOUS
  Administered 2020-01-31 – 2020-02-01 (×2): 5 mg/h via INTRAVENOUS
  Administered 2020-02-01: 2.5 mg/h via INTRAVENOUS
  Administered 2020-02-01: 4 mg/h via INTRAVENOUS
  Administered 2020-02-02: 2.5 mg/h via INTRAVENOUS
  Filled 2020-01-30 (×14): qty 200

## 2020-01-30 MED ORDER — PANTOPRAZOLE SODIUM 40 MG PO PACK
40.0000 mg | PACK | Freq: Every day | ORAL | Status: DC
  Administered 2020-01-30 – 2020-02-17 (×19): 40 mg
  Filled 2020-01-30 (×19): qty 20

## 2020-01-30 MED ORDER — COLCHICINE 0.3 MG HALF TABLET
0.3000 mg | ORAL_TABLET | Freq: Two times a day (BID) | ORAL | Status: DC
  Administered 2020-01-30 – 2020-02-01 (×5): 0.3 mg
  Filled 2020-01-30 (×6): qty 1

## 2020-01-30 MED ORDER — CLOPIDOGREL BISULFATE 75 MG PO TABS
75.0000 mg | ORAL_TABLET | Freq: Every day | ORAL | Status: DC
  Administered 2020-01-30: 75 mg
  Filled 2020-01-30: qty 1

## 2020-01-30 MED ORDER — ACETAMINOPHEN 650 MG RE SUPP: 650.0000 mg | RECTAL | Status: DC | PRN

## 2020-01-30 MED ORDER — EPTIFIBATIDE 20 MG/10ML IV SOLN
INTRAVENOUS | Status: AC
  Filled 2020-01-30: qty 10

## 2020-01-30 MED ORDER — SUGAMMADEX SODIUM 200 MG/2ML IV SOLN
INTRAVENOUS | Status: DC | PRN
  Administered 2020-01-30: 200 mg via INTRAVENOUS

## 2020-01-30 MED ORDER — PHENYLEPHRINE 40 MCG/ML (10ML) SYRINGE FOR IV PUSH (FOR BLOOD PRESSURE SUPPORT)
PREFILLED_SYRINGE | INTRAVENOUS | Status: DC | PRN
  Administered 2020-01-30: 160 ug via INTRAVENOUS
  Administered 2020-01-30 (×2): 80 ug via INTRAVENOUS

## 2020-01-30 MED ORDER — ISOSORBIDE DINITRATE 10 MG PO TABS
10.0000 mg | ORAL_TABLET | Freq: Three times a day (TID) | ORAL | Status: DC
  Administered 2020-01-30 (×3): 10 mg
  Filled 2020-01-30 (×5): qty 1

## 2020-01-30 MED ORDER — CLOPIDOGREL BISULFATE 300 MG PO TABS
ORAL_TABLET | ORAL | Status: AC
  Filled 2020-01-30: qty 1

## 2020-01-30 MED ORDER — VERAPAMIL HCL 2.5 MG/ML IV SOLN
INTRAVENOUS | Status: AC
  Filled 2020-01-30: qty 2

## 2020-01-30 MED ORDER — IOHEXOL 350 MG/ML SOLN
80.0000 mL | Freq: Once | INTRAVENOUS | Status: AC | PRN
  Administered 2020-01-30: 80 mL via INTRAVENOUS

## 2020-01-30 MED ORDER — HEPARIN (PORCINE) 25000 UT/250ML-% IV SOLN
900.0000 [IU]/h | INTRAVENOUS | Status: DC
  Administered 2020-01-30: 650 [IU]/h via INTRAVENOUS
  Administered 2020-01-31: 750 [IU]/h via INTRAVENOUS
  Administered 2020-02-02 – 2020-02-04 (×2): 850 [IU]/h via INTRAVENOUS
  Filled 2020-01-30 (×7): qty 250

## 2020-01-30 MED FILL — Potassium Chloride Inj 2 mEq/ML: INTRAVENOUS | Qty: 40 | Status: AC

## 2020-01-30 MED FILL — Magnesium Sulfate Inj 50%: INTRAMUSCULAR | Qty: 10 | Status: AC

## 2020-01-30 MED FILL — Heparin Sodium (Porcine) Inj 1000 Unit/ML: INTRAMUSCULAR | Qty: 30 | Status: AC

## 2020-01-30 NOTE — Progress Notes (Addendum)
      [address]       [place] [address]             [phone]        Day of Surgery Procedure(s) (LRB): IR WITH ANESTHESIA (N/A)  Subjective: Patient intubated and slowly decreasing sedation  Objective: Vital signs in last 24 hours: Temp:  [96.6 F (35.9 C)-98 F (36.7 C)] 97.5 F (36.4 C) (03/15 0400) Pulse Rate:  [69-74] 69 (03/15 0930) Cardiac Rhythm: A-V Sequential paced (03/15 0400) Resp:  [8-28] 14 (03/15 0930) BP: (112-175)/(40-110) 112/40 (03/15 0930) SpO2:  [96 %-100 %] 100 % (03/15 0930) Arterial Line BP: (112)/(40) 112/40 (03/15 0930) FiO2 (%):  [100 %] 100 % (03/15 0904) Weight:  [70.8 kg] 70.8 kg (03/15 0600)  Pre op weight 65.3 kg Current Weight  01/30/20 70.8 kg       Intake/Output from previous day: 03/14 0701 - 03/15 0700 In: 771.6 [P.O.:480; I.V.:33.4; IV Piggyback:258.2] Out: 2380 [Urine:2080; Chest Tube:300]   Physical Exam:  Cardiovascular: AV paced  Pulmonary: Slightly diminished bibasilar breath sounds Abdomen: Soft, non tender, bowel sounds present. Extremities: Mild bilateral lower extremity edema. Wound: Aquacel removed and sternal wound is clean and dry.  No erythema or signs of infection. Left radial artery dressing is clean and dry  Lab Results: CBC: Recent Labs    01/29/20 0354 01/30/20 0438  WBC 15.6* 13.0*  HGB 11.1* 10.2*  HCT 33.6* 31.1*  PLT 184 165   BMET:  Recent Labs    01/29/20 0354 01/30/20 0438  NA 138 137  K 4.3 3.8  CL 109 101  CO2 22 28  GLUCOSE 129* 112*  BUN 14 19  CREATININE 1.00 1.19*  CALCIUM 9.8 9.6    PT/INR:  Lab Results  Component Value Date   INR 1.1 01/28/2020   INR 1.5 (H) 01/27/2020   INR 1.0 01/27/2020   ABG:  INR: Will add last result for INR, ABG once components are confirmed Will add last 4 CBG results once components are confirmed  Assessment/Plan:  1. CV - AV paced at 70. On Clevidipine drip, Lopressor 12.5 mg bid, Isordil 10 mg tid, Plavix  75 mg daily, and baby ec asa. Will stop Lopressor for now. 2.  Pulmonary -  Bilateral subsegmental acute pulmonary emboli. Chest tubes with 300 cc will remain for now as on vent.  Pulmonary/CCM to lighten sedation later today, evaluate mental status, and will consider extubating if appropriate. CXR ordered for today. Await DIC, lupus panel. And HIT results 3. Volume Overload - Will give low dose Lasix IV this am and slowly diurese over next few days as just received more dye for procedure and creatinine slightly increased to 1.19 4.  Acute blood loss anemia - H and H this am slightly decreased to 10.2 and 31.1 5. Neurologic-s/p cerebral angiogram and mechanical thrombectomy for left M1 occlusion 6. CBGs 119/114/117. Pre op HGA1C 5.1. 7. Supplement potassium 8. Hypothyroidism-on Levothyroxine  Sohana Austell M ZimmermanPA-C 01/30/2020,9:37 AM

## 2020-01-30 NOTE — Consult Note (Addendum)
Neurology Consultation  Reason for Consult: Code stroke for weakness, left-sided gaze Referring Physician: Dr. Orvan Seen, cardiothoracic surgery  CC: Right-sided weakness, leftward gaze  History is obtained from: Chart review, patient's RN at bedside  HPI: [name] [name] is a 79 y.o. female history of coronary artery disease hypertension hyperlipidemia and thyroid cancer and possibly known history of bilateral carotid stenosis, admitted for treatment of symptomatic coronary artery disease and is 2 days postop from her three-vessel CABG, who was walking the unit with her RN and all of a sudden had a sudden onset of weakness where her knees buckled and she started to feel lightheaded.  Following that she became less responsive, nonverbal and had a gaze to the left.  A code stroke was activated and patient was brought stat for head CT. Patient was unable to provide any history. Emergency contact listed on the chart-friend, number was dialed but no answer.  Last known normal was around 6 AM when she was walking the unit with her RN.  Prior to the code stroke being called, she was verbal communicative and per the RN is independent in her ADLs at baseline.  LKW: 06 100 on 01/30/2020 tpa given?: no, CABG 2 days ago Premorbid modified Rankin scale (mRS): Unable to reliably ascertain but probably 0-1.   ROS: Unable to ascertain due to patient's aphasia  Past Medical History:  Diagnosis Date  . Coronary artery disease   . Family history of adverse reaction to anesthesia    " My Sister did "  . HTN (hypertension)   . Hyperlipidemia   . Hypothyroidism   . Osteoporosis   . Thyroid carcinoma ([place])    Family History  Problem Relation Age of Onset  . Breast cancer Mother 25  . Hypertension Mother   . Diabetes Mother   . Heart disease Mother   . Cirrhosis Father   . Cancer Brother   . Heart disease Brother   . Heart attack Sister    Social History:   reports that she has never smoked. She  has never used smokeless tobacco. She reports previous alcohol use. She reports that she does not use drugs.  Medications  Current Facility-Administered Medications:  .  0.45 % sodium chloride infusion, , Intravenous, Continuous PRN, Roddenberry, Myron G, PA-C .  0.9 %  sodium chloride infusion (Manually program via Guardrails IV Fluids), , Intravenous, Once, Atkins, Broadus Z, MD .  0.9 %  sodium chloride infusion (Manually program via Guardrails IV Fluids), , Intravenous, Once, Atkins, Broadus Z, MD .  0.9 %  sodium chloride infusion, 250 mL, Intravenous, Continuous, Roddenberry, Myron G, PA-C .  0.9 %  sodium chloride infusion, , Intravenous, Continuous, Roddenberry, Myron G, PA-C .  acetaminophen (OFIRMEV) IV 1,000 mg, 1,000 mg, Intravenous, Q6H, Atkins, Broadus Z, MD, Last Rate: 400 mL/hr at 01/29/20 2351, 1,000 mg at 01/29/20 2351 .  acetaminophen (TYLENOL) tablet 1,000 mg, 1,000 mg, Oral, Q6H, 1,000 mg at 01/29/20 0534 **OR** acetaminophen (TYLENOL) 160 MG/5ML solution 1,000 mg, 1,000 mg, Per Tube, Q6H, Roddenberry, Myron G, PA-C, 1,000 mg at 01/28/20 [id number] .  acetaminophen (TYLENOL) 160 MG/5ML solution 650 mg, 650 mg, Per Tube, Once **OR** acetaminophen (TYLENOL) suppository 650 mg, 650 mg, Rectal, Once, Roddenberry, Myron G, PA-C .  aspirin chewable tablet 81 mg, 81 mg, Oral, Daily, [name], MD, 81 mg at 01/29/20 0937 .  atorvastatin (LIPITOR) tablet 80 mg, 80 mg, Oral, Q1200, Antony Odea, PA-C, 80 mg at 01/29/20 1218 .  bisacodyl (DULCOLAX) EC tablet 10 mg, 10 mg, Oral, Daily, 10 mg at 01/29/20 0936 **OR** bisacodyl (DULCOLAX) suppository 10 mg, 10 mg, Rectal, Daily, Roddenberry, Myron G, PA-C .  chlorhexidine (PERIDEX) 0.12 % solution 15 mL, 15 mL, Mouth Rinse, BID, Atkins, Glenice Bow, MD, 15 mL at 01/29/20 2212 .  Chlorhexidine Gluconate Cloth 2 % PADS 6 each, 6 each, Topical, Daily, Orvan Seen, Glenice Bow, MD, 6 each at 01/29/20 0600 .  clopidogrel (PLAVIX) tablet 75 mg, 75  mg, Oral, Daily, Atkins, Glenice Bow, MD, 75 mg at 01/29/20 1012 .  colchicine tablet 0.3 mg, 0.3 mg, Oral, BID, Orvan Seen, Glenice Bow, MD, 0.3 mg at 01/29/20 2208 .  dextrose 50 % solution 0-50 mL, 0-50 mL, Intravenous, PRN, Roddenberry, Myron G, PA-C .  docusate sodium (COLACE) capsule 200 mg, 200 mg, Oral, Daily, Roddenberry, Myron G, PA-C, 200 mg at 01/29/20 0936 .  fentaNYL (SUBLIMAZE) injection 50-100 mcg, 50-100 mcg, Intravenous, Q1H PRN, Antony Odea, PA-C, 100 mcg at 01/29/20 1613 .  furosemide (LASIX) injection 40 mg, 40 mg, Intravenous, BID, Atkins, Glenice Bow, MD, 40 mg at 01/29/20 1758 .  CBG monitoring, , , Q4H **AND** insulin aspart (novoLOG) injection 0-24 Units, 0-24 Units, Subcutaneous, Q4H, Orvan Seen, Glenice Bow, MD, 2 Units at 01/29/20 1956 .  insulin regular, human (MYXREDLIN) 100 units/ 100 mL infusion, , Intravenous, Continuous, Roddenberry, Myron G, PA-C, Stopped at 01/28/20 1543 .  isosorbide dinitrate (ISORDIL) tablet 10 mg, 10 mg, Oral, TID, Orvan Seen, Glenice Bow, MD, 10 mg at 01/29/20 2206 .  lactated ringers infusion 500 mL, 500 mL, Intravenous, Once PRN, Roddenberry, Myron G, PA-C .  lactated ringers infusion, , Intravenous, Continuous, Roddenberry, Myron G, PA-C .  levothyroxine (SYNTHROID) tablet 100 mcg, 100 mcg, Oral, Once per day on Mon Wed Fri, Atkins, Broadus Z, MD .  Derrill Memo ON 01/31/2020] levothyroxine (SYNTHROID) tablet 200 mcg, 200 mcg, Oral, Once per day on Sun Tue Thu Sat, Atkins, Broadus Z, MD .  MEDLINE mouth rinse, 15 mL, Mouth Rinse, q12n4p, Atkins, Glenice Bow, MD, 15 mL at 01/29/20 1520 .  metoprolol tartrate (LOPRESSOR) tablet 12.5 mg, 12.5 mg, Oral, BID, 12.5 mg at 01/29/20 2206 **OR** metoprolol tartrate (LOPRESSOR) 25 mg/10 mL oral suspension 12.5 mg, 12.5 mg, Per Tube, BID, Roddenberry, Myron G, PA-C, 12.5 mg at 01/28/20 1033 .  metoprolol tartrate (LOPRESSOR) injection 2.5-5 mg, 2.5-5 mg, Intravenous, Q2H PRN, Roddenberry, Myron G, PA-C .  nitroGLYCERIN 50  mg in dextrose 5 % 250 mL (0.2 mg/mL) infusion, 7-100 mcg/min, Intravenous, Titrated, Roddenberry, Myron G, PA-C, Stopped at 01/29/20 1011 .  ondansetron (ZOFRAN) injection 4 mg, 4 mg, Intravenous, Q6H PRN, Roddenberry, Myron G, PA-C, 4 mg at 01/29/20 1956 .  oxyCODONE (Oxy IR/ROXICODONE) immediate release tablet 5-10 mg, 5-10 mg, Oral, Q3H PRN, Roddenberry, Myron G, PA-C .  pantoprazole (PROTONIX) EC tablet 40 mg, 40 mg, Oral, Daily, Roddenberry, Myron G, PA-C, 40 mg at 01/29/20 0936 .  sodium chloride flush (NS) 0.9 % injection 10-40 mL, 10-40 mL, Intracatheter, Q12H, Orvan Seen, Glenice Bow, MD, 10 mL at 01/29/20 2208 .  sodium chloride flush (NS) 0.9 % injection 10-40 mL, 10-40 mL, Intracatheter, PRN, Atkins, Broadus Z, MD .  sodium chloride flush (NS) 0.9 % injection 3 mL, 3 mL, Intravenous, Q12H, Roddenberry, Myron G, PA-C, 3 mL at 01/29/20 0937 .  sodium chloride flush (NS) 0.9 % injection 3 mL, 3 mL, Intravenous, PRN, Roddenberry, Myron G, PA-C .  traMADol (ULTRAM) tablet 50-100 mg, 50-100 mg, Oral, Q4H  PRN, Antony Odea, PA-C, 100 mg at 01/29/20 0534  Exam: Current vital signs: BP (!) 144/71   Pulse 70   Temp (!) 97.5 F (36.4 C) (Oral)   Resp (!) 9   Ht 5\' 3"  (1.6 m)   Wt 68.9 kg   SpO2 100%   BMI 26.91 kg/m  Vital signs in last 24 hours: Temp:  [96.6 F (35.9 C)-98.4 F (36.9 C)] 97.5 F (36.4 C) (03/15 0400) Pulse Rate:  [69-75] 70 (03/15 0400) Resp:  [8-22] 9 (03/15 0400) BP: (132-175)/(67-110) 144/71 (03/15 0400) SpO2:  [96 %-100 %] 100 % (03/15 0400) Arterial Line BP: (141-145)/(79-114) 141/114 (03/14 0800) General: Awake alert in no distress HEENT: Cephalic atraumatic Lungs: Clear CVS: Bandage in place on the chest, regular rate Neurological exam She is awake, alert but does not follow any commands She is nonverbal Cranial nerves: Pupils are equal and reactive to light, gaze is deviated to the left, no blink to threat from the right, blinks to threat from the  left, right lower facial weakness. Motor exam: Flaccid right upper extremity.  Better minimal movement in the right leg.  Left upper and lower extremities moving spontaneously but does not follow to test more intensely. Sensory exam: Unable to level yesterday Coordination: Unable to reliably ascertain NIHSS 1a Level of Conscious.: 0 1b LOC Questions: 2 1c LOC Commands: 2 2 Best Gaze: 2 3 Visual: 2 4 Facial Palsy: 1 5a Motor Arm - left: 0 5b Motor Arm - Right: 4 6a Motor Leg - Left: 0 6b Motor Leg - Right: 3 7 Limb Ataxia: 0 8 Sensory: 0 9 Best Language: 3 10 Dysarthria: 2 11 Extinct. and Inatten.: 0 TOTAL: 21  Labs I have reviewed labs in epic and the results pertinent to this consultation are:   CBC    Component Value Date/Time   WBC 13.0 (H) 01/30/2020 0438   RBC 3.38 (L) 01/30/2020 0438   HGB 10.2 (L) 01/30/2020 0438   HGB 11.7 01/24/2020 1039   HCT 31.1 (L) 01/30/2020 0438   HCT 34.9 01/24/2020 1039   PLT 165 01/30/2020 0438   PLT 238 01/24/2020 1039   MCV 92.0 01/30/2020 0438   MCV 89 01/24/2020 1039   MCH 30.2 01/30/2020 0438   MCHC 32.8 01/30/2020 0438   RDW 14.6 01/30/2020 0438   RDW 13.4 01/24/2020 1039    CMP     Component Value Date/Time   NA 137 01/30/2020 0438   NA 142 01/23/2020 1034   K 3.8 01/30/2020 0438   CL 101 01/30/2020 0438   CO2 28 01/30/2020 0438   GLUCOSE 112 (H) 01/30/2020 0438   BUN 19 01/30/2020 0438   BUN 18 01/23/2020 1034   CREATININE 1.19 (H) 01/30/2020 0438   CALCIUM 9.6 01/30/2020 0438   GFRNONAA 44 (L) 01/30/2020 0438   GFRAA 51 (L) 01/30/2020 0438   Imaging I have reviewed the images obtained:  CT-scan of the brain- dense LMCA, ASPECTS 10 CTA-left M1 occlusion.  Also noted pulmonary embolus-bilateral subsegmental. CT perfusion with a 74 cc core, 85 cc of salvageable penumbra  Assessment: 79 year old 2 days postop CABG for triple-vessel disease, with sudden onset of leftward gaze and right-sided weakness with  aphasia. NIH stroke scale 21. CT head with dense left MCA.  Aspects 10.  CT angio with left M1 occlusion.  Also noted to have bilateral pulmonary emboli. Attempted to reach family-Cliff Olena Heckle, no response from the phone. Reached out to emergency contact on the [facility].  Discussed with the cardiothoracic surgeon Dr. Orvan Seen and signed emergency consent with him and he agreeing that patient would benefit from an endovascular intervention. Initially discussed the case with Dr. Estanislado Pandy and then Dr. Ladean Raya took over and patient is currently in IR.  Discussion about the endovascular procedure with the emergency contact, who is not a family member. Consent signed emergently with approval of the cardiothoracic surgeon who is the admitting attending Dr. Orvan Seen.  Witnessed by rapid response RN.   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  Recommendations: Post procedure blood pressure parameters per endovascular. If revascularization is successful I would imagine the endovascular team would want the blood pressures to be 0000000 systolic.  If revascularization is unsuccessful, allow for permissive hypertension. Frequent neurochecks Telemetry 2D echocardiogram A1c Lipid panel PT OT speech therapy Discussion about anticoagulation once the procedure is completed per stroke team. Care was transitioned to the oncoming day neuro hospitalist and the stroke team will continue to follow with you.  -- [name], MD [facility] Pager: [phone] If 7pm to 7am, please call on call as listed on AMION.   CRITICAL CARE ATTESTATION Performed by: [name], MD Total critical care time: 70 minutes Critical care time was exclusive of separately billable procedures and treating other patients and/or supervising APPs/Residents/Students Critical care was necessary to treat or prevent imminent or life-threatening deterioration  due to acute ischemic stroke This patient is critically ill and at significant risk for neurological worsening and/or death and care requires constant monitoring. Critical care was time spent personally by me on the following activities: development of treatment plan with patient and/or surrogate as well as nursing, discussions with consultants, evaluation of patient's response to treatment, examination of patient, obtaining history from patient or surrogate, ordering and performing treatments and interventions, ordering and review of laboratory studies, ordering and review of radiographic studies, pulse oximetry, re-evaluation of patient's condition, participation in multidisciplinary rounds and medical decision making of high complexity in the care of this patient.

## 2020-01-30 NOTE — Progress Notes (Signed)
ANTICOAGULATION CONSULT NOTE - [place] for Heparin Indication: pulmonary embolus, stroke and DVT, and recent CABG  Allergies  Allergen Reactions  . Bee Venom Swelling    Massive swelling  . Penicillins Other (See Comments)    UNSPECIFIED REACTION  Did it involve swelling of the face/tongue/throat, SOB, or low BP? Unknown Did it involve sudden or severe rash/hives, skin peeling, or any reaction on the inside of your mouth or nose? Unknown Did you need to seek medical attention at a hospital or doctor's office? Unknown When did it last happen?1945 If all above answers are "NO", may proceed with cephalosporin use.  . Adhesive [Tape] Rash  . Morphine And Related Rash  . Sulfa Antibiotics Nausea And Vomiting and Rash    Patient Measurements: Height: 5\' 3"  (160 cm) Weight: 156 lb 1.4 oz (70.8 kg) IBW/kg (Calculated) : 52.4 Heparin Dosing Weight: 65.3 kg  Vital Signs: Temp: 98.5 F (36.9 C) (03/15 2006) Temp Source: Oral (03/15 2006) BP: 126/36 (03/15 2026) Pulse Rate: 74 (03/15 1900)  Labs: Recent Labs    01/28/20 0625 01/28/20 0915 01/28/20 1829 01/28/20 1829 01/29/20 0354 01/29/20 0354 01/30/20 0438 01/30/20 0955 01/30/20 1200 01/30/20 1746  HGB  --    < > 11.3*   < > 11.1*   < > 10.2*  --  9.2*  --   HCT  --    < > 33.2*   < > 33.6*  --  31.1*  --  27.0*  --   PLT  --    < > 158  --  184  --  165  --   --   --   APTT 30  --   --   --   --   --   --   --   --   --   LABPROT 14.4  --   --   --   --   --   --  14.1  --   --   INR 1.1  --   --   --   --   --   --  1.1  --   --   HEPARINUNFRC  --   --   --   --   --   --   --   --   --  0.15*  CREATININE 0.80   < > 0.89  --  1.00  --  1.19*  --   --   --    < > = values in this interval not displayed.    Estimated Creatinine Clearance: 38 mL/min (A) (by C-G formula based on SCr of 1.19 mg/dL (H)).   Medical History: Past Medical History:  Diagnosis Date  . Coronary artery  disease   . Family history of adverse reaction to anesthesia    " My Sister did "  . HTN (hypertension)   . Hyperlipidemia   . Hypothyroidism   . Osteoporosis   . Thyroid carcinoma ([facility])     Medications:  Plavix is being held as changing to full anticoagulation with IV Heparin  Assessment: 79 year old female admitted with angina on 3/11 and s/p CABG on 3/12 who had acute stroke symptoms on 3/15 AM. CTA of Head and Neck showed an acute MCA infarct and bilateral subsegmental acute pulmonary emboli. She is now s/p IR guided mechanical thrombectomy this AM and Pharmacy has been consulted to start IV Heparin therapy. Critical care is concerned for acquired thrombophilia and  has sent a HIT screen, lupus anticoagulant screen, and a DIC panel. 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 Her INR is within normal limits. Fibrinogen is elevated at 653. D-dimer up at 12.5. Hgb is stable at 10.2. Platelets are stable for admission at 165.   Heparin level this evening resulted as SUBtherapeutic but low detectable (HL 0.15). No bleeding or issues noted per RN.   Goal of Therapy:  Heparin level 0.3 to 0.5 units/ml Monitor platelets by anticoagulation protocol: Yes   Plan:  - Increase Heparin to 750 units/hr (7.5 ml/hr) - Follow-up HIT screen, lupus anticoagulant screen, and DIC panel - Will continue to monitor for any signs/symptoms of bleeding and will follow up with heparin level in 8 hours   Thank you for allowing pharmacy to be a part of this patient's care.  Alycia Rossetti, PharmD, BCPS Clinical Pharmacist Clinical phone for 01/30/2020: [phone] 01/30/2020 10:19 PM   **Pharmacist phone directory can now be found on [email or web address] ([email or web address]).  Listed under [place].

## 2020-01-30 NOTE — Anesthesia Procedure Notes (Signed)
Procedure Name: Intubation Date/Time: 01/30/2020 7:11 AM Performed by: Orlie Dakin, CRNA Pre-anesthesia Checklist: Patient identified, Emergency Drugs available, Suction available and Patient being monitored Patient Re-evaluated:Patient Re-evaluated prior to induction Oxygen Delivery Method: Circle system utilized Preoxygenation: Pre-oxygenation with 100% oxygen Induction Type: IV induction and Rapid sequence Laryngoscope Size: Miller and 3 Grade View: Grade I Tube type: Oral Tube size: 7.5 mm Number of attempts: 1 Airway Equipment and Method: Stylet Placement Confirmation: ETT inserted through vocal cords under direct vision,  positive ETCO2 and breath sounds checked- equal and bilateral Secured at: 23 cm Tube secured with: Tape Dental Injury: Teeth and Oropharynx as per pre-operative assessment  Comments: 4x4s bite block used at end of case.

## 2020-01-30 NOTE — Progress Notes (Signed)
Bilateral lower extremity venous duplex has been completed. Preliminary results can be found in CV Proc through chart review.  Results were given to the patient's nurses, Levada Dy and Santiago Glad.  01/30/20 3:05 PM Carlos Levering RVT

## 2020-01-30 NOTE — Anesthesia Postprocedure Evaluation (Signed)
Anesthesia Post Note  Patient: [name] [name]  Procedure(s) Performed: IR WITH ANESTHESIA (N/A )     Patient location during evaluation: SICU Anesthesia Type: General Level of consciousness: sedated Pain management: pain level controlled Vital Signs Assessment: post-procedure vital signs reviewed and stable Respiratory status: patient remains intubated per anesthesia plan Cardiovascular status: stable Postop Assessment: no apparent nausea or vomiting Anesthetic complications: no    Last Vitals:  Vitals:   01/30/20 1030 01/30/20 1130  BP:  133/64  Pulse: 67   Resp: 13   Temp:    SpO2: 100%     Last Pain:  Vitals:   01/30/20 0400  TempSrc: Oral  PainSc: 0-No pain                 Catalina Gravel

## 2020-01-30 NOTE — Progress Notes (Signed)
Unable to assess NIH pt under the care of anesthesia

## 2020-01-30 NOTE — Progress Notes (Signed)
ETT holder placed on patient. Patient tolerated well. RT will continue to monitor.

## 2020-01-30 NOTE — Anesthesia Preprocedure Evaluation (Signed)
Anesthesia Evaluation  Patient identified by MRN, date of birth, ID band Patient unresponsive    Reviewed: Allergy & Precautions, NPO status , Patient's Chart, lab work & pertinent test results, Unable to perform ROS - Chart review onlyPreop documentation limited or incomplete due to emergent nature of procedure.  Airway Mallampati: II  TM Distance: >3 FB Neck ROM: Full    Dental  (+) Teeth Intact, Dental Advisory Given   Pulmonary neg pulmonary ROS,    Pulmonary exam normal breath sounds clear to auscultation       Cardiovascular hypertension, Pt. on home beta blockers + angina + CAD and + CABG  Normal cardiovascular exam Rhythm:Regular Rate:Normal  S/p CABG 01/27/2020   Neuro/Psych CVA    GI/Hepatic negative GI ROS, Neg liver ROS,   Endo/Other  Hypothyroidism   Renal/GU negative Renal ROS     Musculoskeletal negative musculoskeletal ROS (+)   Abdominal   Peds  Hematology  (+) Blood dyscrasia, anemia ,   Anesthesia Other Findings Day of surgery medications reviewed with the patient.  Reproductive/Obstetrics                             Anesthesia Physical Anesthesia Plan  ASA: IV and emergent  Anesthesia Plan: General   Post-op Pain Management:    Induction: Intravenous  PONV Risk Score and Plan: 3  Airway Management Planned: Oral ETT  Additional Equipment: Arterial line  Intra-op Plan:   Post-operative Plan: Possible Post-op intubation/ventilation  Informed Consent: I have reviewed the patients History and Physical, chart, labs and discussed the procedure including the risks, benefits and alternatives for the proposed anesthesia with the patient or authorized representative who has indicated his/her understanding and acceptance.     Dental advisory given, Only emergency history available and History available from chart only  Plan Discussed with: CRNA  Anesthesia Plan  Comments: (CODE STROKE EMERGENCY CONSENT)        Anesthesia Quick Evaluation

## 2020-01-30 NOTE — Addendum Note (Signed)
Addendum  created 01/30/20 1402 by Orlie Dakin, CRNA   Intraprocedure Event edited

## 2020-01-30 NOTE — Progress Notes (Signed)
Pt intubated, sedated and under the care of anesthesia for this case.

## 2020-01-30 NOTE — Progress Notes (Signed)
ANTICOAGULATION CONSULT NOTE - Initial Consult  Pharmacy Consult for Heparin Indication: pulmonary embolus, stroke and recent CABG  Allergies  Allergen Reactions  . Bee Venom Swelling    Massive swelling  . Penicillins Other (See Comments)    UNSPECIFIED REACTION  Did it involve swelling of the face/tongue/throat, SOB, or low BP? Unknown Did it involve sudden or severe rash/hives, skin peeling, or any reaction on the inside of your mouth or nose? Unknown Did you need to seek medical attention at a hospital or doctor's office? Unknown When did it last happen?1945 If all above answers are "NO", may proceed with cephalosporin use.  . Adhesive [Tape] Rash  . Morphine And Related Rash  . Sulfa Antibiotics Nausea And Vomiting and Rash    Patient Measurements: Height: 5\' 3"  (160 cm) Weight: 156 lb 1.4 oz (70.8 kg) IBW/kg (Calculated) : 52.4 Heparin Dosing Weight: 65.3 kg  Vital Signs: Temp: 97.5 F (36.4 C) (03/15 0400) Temp Source: Oral (03/15 0400) BP: 112/40 (03/15 0930) Pulse Rate: 67 (03/15 1030)  Labs: Recent Labs    01/27/20 1843 01/27/20 2048 01/28/20 0625 01/28/20 0915 01/28/20 1829 01/28/20 1829 01/29/20 0354 01/30/20 0438 01/30/20 0955  HGB  --  11.0*  --    < > 11.3*   < > 11.1* 10.2*  --   HCT  --  33.6*  --    < > 33.2*  --  33.6* 31.1*  --   PLT  --  137*  --    < > 158  --  184 165  --   APTT  --  33 30  --   --   --   --   --   --   LABPROT  --  17.6* 14.4  --   --   --   --   --  14.1  INR  --  1.5* 1.1  --   --   --   --   --  1.1  CREATININE   < >  --  0.80   < > 0.89  --  1.00 1.19*  --    < > = values in this interval not displayed.    Estimated Creatinine Clearance: 38 mL/min (A) (by C-G formula based on SCr of 1.19 mg/dL (H)).   Medical History: Past Medical History:  Diagnosis Date  . Coronary artery disease   . Family history of adverse reaction to anesthesia    " My Sister did "  . HTN (hypertension)   . Hyperlipidemia   .  Hypothyroidism   . Osteoporosis   . Thyroid carcinoma (HCC)     Medications:  Plavix is being held as changing to full anticoagulation with IV Heparin  Assessment: 79 year old female admitted with angina on 3/11 and s/p CABG on 3/12 who had acute stroke symptoms on 3/15 AM. CTA of Head and Neck showed an acute MCA infarct and bilateral subsegmental acute pulmonary emboli. She is now s/p IR guided mechanical thrombectomy this AM and Pharmacy has been consulted to start IV Heparin therapy. 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 Her INR is within normal limits. Fibrinogen is elevated at 653. D-dimer up at 12.5. Hgb is stable at 10.2. Platelets are stable for admission at 165.   Goal of Therapy:  Heparin level 0.3 to 0.5 units/ml Monitor platelets by anticoagulation  protocol: Yes   Plan:  Start IV Heparin - no bolus - at lower dose of 650 units/hr.  Heparin level in 6 hours.  Daily heparin level and CBC while on therapy.  Follow-up HIT screen, lupus anticoagulant screen, and DIC panel  Sloan Leiter, PharmD, BCPS, BCCCP Clinical Pharmacist Clinical phone 01/30/2020 until 3PM [phone] Please refer to [facility] for Tracy numbers 01/30/2020,11:24 AM

## 2020-01-30 NOTE — Progress Notes (Signed)
STROKE TEAM PROGRESS NOTE   INTERVAL HISTORY Pt still intubated on vent. Eyes half way open but not tracking. Eyes able to cross midline now but still incomplete gaze on the right. Not following commands. Right UE and LE some movement with pain, improved from earlier. Not able to do MRI due to external wire, but CT in IR suite showed no hemorrhage. Given her b/l PE and embolic stroke, will start heparin IV per stroke protocol and repeat CT in am.   Vitals:   01/30/20 0900 01/30/20 0904 01/30/20 0915 01/30/20 0930  BP: (!) 129/47 (!) 135/46 (!) 126/45 (!) 112/40  Pulse:  70  69  Resp:  20  14  Temp:      TempSrc:      SpO2:  96%  100%  Weight:      Height:        CBC:  Recent Labs  Lab 01/29/20 0354 01/30/20 0438  WBC 15.6* 13.0*  HGB 11.1* 10.2*  HCT 33.6* 31.1*  MCV 90.8 92.0  PLT 184 [id number]    Basic Metabolic Panel:  Recent Labs  Lab 01/28/20 0625 01/28/20 1244 01/28/20 1829 01/28/20 1829 01/29/20 0354 01/30/20 0438  NA 142   < > 138   < > 138 137  K 3.4*   < > 3.8   < > 4.3 3.8  CL 110  --  110   < > 109 101  CO2 24  --  21*   < > 22 28  GLUCOSE 159*  --  124*   < > 129* 112*  BUN 12  --  13   < > 14 19  CREATININE 0.80  --  0.89   < > 1.00 1.19*  CALCIUM 8.8*  --  9.6   < > 9.8 9.6  MG 2.9*  --  2.4  --   --   --    < > = values in this interval not displayed.   Lipid Panel: No results found for: CHOL, TRIG, HDL, CHOLHDL, VLDL, LDLCALC HgbA1c:  Lab Results  Component Value Date   HGBA1C 5.1 01/27/2020   Urine Drug Screen: No results found for: LABOPIA, COCAINSCRNUR, LABBENZ, AMPHETMU, THCU, LABBARB  Alcohol Level No results found for: ETH  IMAGING past 24 hours CT Code Stroke CTA Head W/WO contrast  Result Date: 01/30/2020 CLINICAL DATA:  Stroke symptoms EXAM: CT ANGIOGRAPHY HEAD AND NECK CT PERFUSION BRAIN TECHNIQUE: Multidetector CT imaging of the head and neck was performed using the standard protocol during bolus administration of intravenous  contrast. Multiplanar CT image reconstructions and MIPs were obtained to evaluate the vascular anatomy. Carotid stenosis measurements (when applicable) are obtained utilizing NASCET criteria, using the distal internal carotid diameter as the denominator. Multiphase CT imaging of the brain was performed following IV bolus contrast injection. Subsequent parametric perfusion maps were calculated using RAPID software. CONTRAST:  Dose is not available COMPARISON:  Head CT from earlier today FINDINGS: CTA NECK FINDINGS Aortic arch: Atherosclerosis.  Changes of recent cardiac surgery. Right carotid system: Atherosclerotic plaque primarily at the bifurcation with up to 40% stenosis Left carotid system: Advanced atheromatous narrowing at the ICA bulb, flow reducing and measuring 80% on sagittal reformats. No dissection. Vertebral arteries: No proximal subclavian stenosis. The right vertebral artery is dominant and diffusely patent. Proximal left vertebral occlusion with reconstitution and robust flow by the dura. 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 Other neck: No acute finding. Upper chest: Bilateral upper lobe subsegmental pulmonary  emboli with central branching acute appearance. Review of the MIP images confirms the above findings CTA HEAD FINDINGS Anterior circulation: Left M1 occlusion. Atherosclerotic calcification of both carotid siphons with moderate narrowing at the left paraclinoid segment. Posterior circulation: The right vertebral artery is dominant. Diffuse atheromatous plaque seen along the left V4 segment with patency to the basilar. The basilar is smooth and diffusely patent. No PCA branch occlusion or flow limiting stenosis. Mild right P2 segment narrowing. Venous sinuses: Unremarkable in the arterial phase Anatomic variants: None significant Review of the MIP images confirms the above findings CT Brain Perfusion Findings: ASPECTS: 10 CBF (<30%) Volume: 50mL Perfusion (Tmax>6.0s)  volume: 146mL Mismatch Volume: 85 cc of penumbramL Critical Value/emergent results were called by telephone at the time of interpretation on 01/30/2020 at 7:01 am to provider [facility] , who verbally acknowledged these results. IMPRESSION: 1. Emergent large vessel occlusion at the left M1 segment. 2. Aspects of 10 with CT perfusion suggesting core infarct of 74 cc and 85 cc of penumbra. 3. Flow reducing left ICA bulb stenosis. 4. Bilateral subsegmental acute pulmonary emboli. 5. Proximal occlusion of the non dominant left vertebral artery with reconstitution at the dura. Electronically Signed   By: Monte Fantasia M.D.   On: 01/30/2020 07:04   CT Code Stroke CTA Neck W/WO contrast  Result Date: 01/30/2020 CLINICAL DATA:  Stroke symptoms EXAM: CT ANGIOGRAPHY HEAD AND NECK CT PERFUSION BRAIN TECHNIQUE: Multidetector CT imaging of the head and neck was performed using the standard protocol during bolus administration of intravenous contrast. Multiplanar CT image reconstructions and MIPs were obtained to evaluate the vascular anatomy. Carotid stenosis measurements (when applicable) are obtained utilizing NASCET criteria, using the distal internal carotid diameter as the denominator. Multiphase CT imaging of the brain was performed following IV bolus contrast injection. Subsequent parametric perfusion maps were calculated using RAPID software. CONTRAST:  Dose is not available COMPARISON:  Head CT from earlier today FINDINGS: CTA NECK FINDINGS Aortic arch: Atherosclerosis.  Changes of recent cardiac surgery. Right carotid system: Atherosclerotic plaque primarily at the bifurcation with up to 40% stenosis Left carotid system: Advanced atheromatous narrowing at the ICA bulb, flow reducing and measuring 80% on sagittal reformats. No dissection. Vertebral arteries: No proximal subclavian stenosis. The right vertebral artery is dominant and diffusely patent. Proximal left vertebral occlusion with reconstitution and  robust flow by the dura. 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 Other neck: No acute finding. Upper chest: Bilateral upper lobe subsegmental pulmonary emboli with central branching acute appearance. Review of the MIP images confirms the above findings CTA HEAD FINDINGS Anterior circulation: Left M1 occlusion. Atherosclerotic calcification of both carotid siphons with moderate narrowing at the left paraclinoid segment. Posterior circulation: The right vertebral artery is dominant. Diffuse atheromatous plaque seen along the left V4 segment with patency to the basilar. The basilar is smooth and diffusely patent. No PCA branch occlusion or flow limiting stenosis. Mild right P2 segment narrowing. Venous sinuses: Unremarkable in the arterial phase Anatomic variants: None significant Review of the MIP images confirms the above findings CT Brain Perfusion Findings: ASPECTS: 10 CBF (<30%) Volume: 66mL Perfusion (Tmax>6.0s) volume: 16mL Mismatch Volume: 85 cc of penumbramL Critical Value/emergent results were called by telephone at the time of interpretation on 01/30/2020 at 7:01 am to provider [facility] , who verbally acknowledged these results. IMPRESSION: 1. Emergent large vessel occlusion at the left M1 segment. 2. Aspects of 10 with CT perfusion suggesting core infarct of 74 cc and 85  cc of penumbra. 3. Flow reducing left ICA bulb stenosis. 4. Bilateral subsegmental acute pulmonary emboli. 5. Proximal occlusion of the non dominant left vertebral artery with reconstitution at the dura. Electronically Signed   By: Monte Fantasia M.D.   On: 01/30/2020 07:04   CT Code Stroke Cerebral Perfusion with contrast  Result Date: 01/30/2020 CLINICAL DATA:  Stroke symptoms EXAM: CT ANGIOGRAPHY HEAD AND NECK CT PERFUSION BRAIN TECHNIQUE: Multidetector CT imaging of the head and neck was performed using the standard protocol during bolus administration of intravenous contrast. Multiplanar CT image  reconstructions and MIPs were obtained to evaluate the vascular anatomy. Carotid stenosis measurements (when applicable) are obtained utilizing NASCET criteria, using the distal internal carotid diameter as the denominator. Multiphase CT imaging of the brain was performed following IV bolus contrast injection. Subsequent parametric perfusion maps were calculated using RAPID software. CONTRAST:  Dose is not available COMPARISON:  Head CT from earlier today FINDINGS: CTA NECK FINDINGS Aortic arch: Atherosclerosis.  Changes of recent cardiac surgery. Right carotid system: Atherosclerotic plaque primarily at the bifurcation with up to 40% stenosis Left carotid system: Advanced atheromatous narrowing at the ICA bulb, flow reducing and measuring 80% on sagittal reformats. No dissection. Vertebral arteries: No proximal subclavian stenosis. The right vertebral artery is dominant and diffusely patent. Proximal left vertebral occlusion with reconstitution and robust flow by the dura. 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 Other neck: No acute finding. Upper chest: Bilateral upper lobe subsegmental pulmonary emboli with central branching acute appearance. Review of the MIP images confirms the above findings CTA HEAD FINDINGS Anterior circulation: Left M1 occlusion. Atherosclerotic calcification of both carotid siphons with moderate narrowing at the left paraclinoid segment. Posterior circulation: The right vertebral artery is dominant. Diffuse atheromatous plaque seen along the left V4 segment with patency to the basilar. The basilar is smooth and diffusely patent. No PCA branch occlusion or flow limiting stenosis. Mild right P2 segment narrowing. Venous sinuses: Unremarkable in the arterial phase Anatomic variants: None significant Review of the MIP images confirms the above findings CT Brain Perfusion Findings: ASPECTS: 10 CBF (<30%) Volume: 36mL Perfusion (Tmax>6.0s) volume: 198mL Mismatch Volume: 85  cc of penumbramL Critical Value/emergent results were called by telephone at the time of interpretation on 01/30/2020 at 7:01 am to provider [facility] , who verbally acknowledged these results. IMPRESSION: 1. Emergent large vessel occlusion at the left M1 segment. 2. Aspects of 10 with CT perfusion suggesting core infarct of 74 cc and 85 cc of penumbra. 3. Flow reducing left ICA bulb stenosis. 4. Bilateral subsegmental acute pulmonary emboli. 5. Proximal occlusion of the non dominant left vertebral artery with reconstitution at the dura. Electronically Signed   By: Monte Fantasia M.D.   On: 01/30/2020 07:04   DG CHEST PORT 1 VIEW  Result Date: 01/30/2020 CLINICAL DATA:  Intubation of airway EXAM: PORTABLE CHEST 1 VIEW COMPARISON:  01/30/2020 FINDINGS: Endotracheal tube tip 2 cm above the carina. NGT placed with the tip in the stomach. Right jugular central venous catheter tips unchanged. Two catheters in place in the right SVC. Bilateral chest tubes in place. Mediastinal drain in place. No pneumothorax. Right lower lobe atelectasis unchanged. Right hilar surgical clips again noted. No significant effusion. IMPRESSION: Endotracheal tube placed with the tip 2 cm above the carina. Right lower lobe atelectasis unchanged. Bilateral chest tubes without pneumothorax. Electronically Signed   By: Franchot Gallo M.D.   On: 01/30/2020 10:07   DG Chest [facility] 1 View  Result  Date: 01/30/2020 CLINICAL DATA:  Chest tube, post open heart surgery EXAM: PORTABLE CHEST 1 VIEW COMPARISON:  Portable exam 0506 hours compared to 01/29/2020 FINDINGS: Mediastinal drain and BILATERAL thoracostomy tubes present. Two RIGHT jugular central venous catheters, tips projecting over SVC and cavoatrial junction. Epicardial pacing wires present. Enlargement of cardiac silhouette with pulmonary vascular congestion. Prominent mediastinum consistent with preceding surgery. Scattered atelectasis in the mid to lower lungs bilaterally with small  RIGHT pleural effusion. Tiny LEFT apex pneumothorax, decreased. Bones demineralized with LEFT glenohumeral degenerative changes noted as well as degenerative changes of the thoracic spine. IMPRESSION: Bibasilar atelectasis and RIGHT pleural effusion. Tiny LEFT apex pneumothorax despite thoracostomy tube, decreased. Electronically Signed   By: Lavonia Dana M.D.   On: 01/30/2020 07:55   CT HEAD CODE STROKE WO CONTRAST  Result Date: 01/30/2020 CLINICAL DATA:  Code stroke. Ataxia. Less responsive. Left-sided gaze. EXAM: CT HEAD WITHOUT CONTRAST TECHNIQUE: Contiguous axial images were obtained from the base of the skull through the vertex without intravenous contrast. COMPARISON:  Brain MRI 09/06/2018 FINDINGS: Brain: No evidence of acute infarction, hemorrhage, hydrocephalus, extra-axial collection or mass lesion/mass effect. Vascular: Hyperdense left M1 segment Skull: Negative Sinuses/Orbits: Negative Other: These results were communicated to Dr. Rory Percy at 6:35 amon 3/15/2021by text page via the [facility] messaging system. ASPECTS [facility] Stroke Program Early CT Score) - Ganglionic level infarction (caudate, lentiform nuclei, internal capsule, insula, M1-M3 cortex): 7 - Supraganglionic infarction (M4-M6 cortex): 3 Total score (0-10 with 10 being normal): 10 IMPRESSION: 1. Hyperdense left M1 segment.  CTA and perfusion are in progress. 2. ASPECTS is 10. Electronically Signed   By: Monte Fantasia M.D.   On: 01/30/2020 06:38    PHYSICAL EXAM  Temp:  [96.6 F (35.9 C)-98 F (36.7 C)] 97.5 F (36.4 C) (03/15 0400) Pulse Rate:  [67-74] 67 (03/15 1030) Resp:  [8-28] 13 (03/15 1030) BP: (112-175)/(40-110) 133/64 (03/15 1130) SpO2:  [96 %-100 %] 100 % (03/15 1030) Arterial Line BP: (112-138)/(40-60) 136/60 (03/15 1100) FiO2 (%):  [100 %] 100 % (03/15 0904) Weight:  [70.8 kg] 70.8 kg (03/15 0600)  General - Well nourished, well developed, intubated on precedex.  Ophthalmologic - fundi not visualized due to  noncooperation.  Cardiovascular - Regular rate and rhythm.  Neuro - intubated on precedex, eyes half way open, bigger with voice stimulation but not following commands. With eye opening, eyes in left gaze preference position, but able to cross midline although incomplete on right gaze. More consistenly blinking to visual threat on the left but inconsistent on the right, doll's eyes present, not tracking, PERRL. Corneal reflex present, gag and cough present. Breathing over the vent.  Facial symmetry not able to test due to ET tube.  Tongue protrusion not cooperative. On pain stimulation, moving left UE and LE against gravity. However, RUE proximal 2/5 and distal 3/5 on pain, RLE 2+/5 on pain. DTR 1+ and bilateral positive babinski. Sensation, coordination and gait not tested.    ASSESSMENT/PLAN Ms. [name] [name] is a 79 y.o. female with history of coronary artery disease, hypertension, hyperlipidemia, thyroid cancer and possible bilateral carotid stenosis, admitted for treatment of symptomatic coronary artery disease and is 2 days postop from her three-vessel CABG, who while up walking with nurse became weak and developed L gaze, R sided weakness and aphasia. CT with dense L MCA. CTA w/ L M1 occlusion. Taken to IR.   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 secondary to large vessel disease source  Acute left gaze, right side weakness  Code Stroke CT head dense L MCA. ASPECTS 10.     CTA head & neck L M1 occlusion. Proximal L VA occlusion. L ICA bulb stenosis. B subsegmental PE.  CT perfusion 74cc core, 85 penumbra  Cerebral angio/IR proximal L M1 occlusion w/p mechanical thrombectomy w/TICI3 recanalization.  Post IR CT no hemorrhage  Not able to perform MRI due to external pacing wire  CT head 3/16 am pending   LE Doppler  pending  2D Echo pending  TEE (intraop  3/12) + PFO  LDL pending   HgbA1c 5.1  SCDs for VTE prophylaxis  aspirin 81 mg daily prior to admission, now on aspirin 81 mg daily and clopidogrel 75 mg daily. Recommend to IV heparin per stroke protocol given PE and embolic stroke. Discussed with Dr. Orvan Seen, will hold off plavix for now   Therapy recommendations:  pending   Disposition:  pending   B PE  Concern for acquired hemophilia vs. DVT from inactivity  HIT screen, lupus ac panel, DIC panel pending   Neuro cleared for IV heparin  LE venous doppler pending   PFO  TEE showed PFO present at the supeior aspect of the fossa ovalis with continuous left to right flow.   LE venous doppler pending  Concerning for paradoxical emboli  Acute Respiratory Failure Intubated for IR; unable to tolerate extubation post procedure  B CT post op  Monitoring for extubation  CCM on board  Coronary artery disease s/p 3v CABG 3/13 Volume Overload Acute Blood Loss Anemia  External pacer   Lasix  Hgb 11.1->10.2  Management per cardiology  On ASA and heparin IV now  Carotid stenosis  Chronic as per hx  CTA neck showed left ICA bulb tandem high grade stenosis and right ICA atherosclerosis.   outpt follow up with [facility]  Hypertension  Stable so far . BP goal 120-140 within 24h of mechanical thrombectomy . On cardene PRN . Long-term BP goal normotensive  Hyperlipidemia  Home meds:  lipitor 80, resumed in hospital  LDL pending, goal < 70  Continue statin at discharge  Dysphagia . NPO for now . Has OG tube . 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 0.89->1.0->1.19 Monitor Cre  Hx Thyroid cancer  Hypothyroidism on levothryroxine  Hospital day # 4  This patient is critically ill due to acute left MCA infarct, left M1  occlusion s/p EVT, CAD s/p CABG, PFO, PE, intubated and at significant risk of neurological worsening, death form heart failure, respiratory failure, recrurent stroke, hemorrhagic conversion. This patient's care requires constant monitoring of vital signs, hemodynamics, respiratory and cardiac monitoring, review of multiple databases, neurological assessment, discussion with family, other specialists and medical decision making of high complexity. I spent 40 minutes of neurocritical care time in the care of this patient. I discussed with Dr. Orvan Seen from [facility] and Dr. Pearline Cables from [facility].   Rosalin Hawking, MD PhD Stroke Neurology 01/30/2020 12:03 PM   To contact Stroke Continuity provider, please refer to [email or web address] After hours, contact General Neurology

## 2020-01-30 NOTE — Progress Notes (Signed)
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 Karenann Cai. Patient laying in bed intubated with sedation. She opens eyes to name and follows some simple commands. Can wiggle bilateral toes and move LUE but no movements of RUE. Right groin incision c/d/i.   Allergies: Bee venom, Penicillins, Adhesive [tape], Morphine and related, and Sulfa antibiotics  Medications: Prior to Admission medications   Medication Sig Start Date End Date Taking? Authorizing Provider  acetaminophen (TYLENOL) 500 MG tablet Take 1,000 mg by mouth in the morning and at bedtime.    Yes [provider]  aspirin EC 81 MG tablet Take 81 mg by mouth at bedtime.   Yes [provider]  atorvastatin (LIPITOR) 80 MG tablet Take 80 mg by mouth daily at 12 noon.    Yes [provider]  cholecalciferol (VITAMIN D) 1000 UNITS tablet Take 1,000 Units by mouth daily at 12 noon.    Yes [provider]  fluticasone (FLONASE) 50 MCG/ACT nasal spray Place 2 sprays into both nostrils daily. 12/19/19  Yes [provider]  levothyroxine (SYNTHROID, LEVOTHROID) 200 MCG tablet Take 100-200 mcg by mouth See admin instructions. Take 200 mcg  SUN-TUES-THUR-SAT Take 100 mcg tab MON-WED-FRI  DO NOT USE GENERIC   Yes [provider]  lisinopril (ZESTRIL) 20 MG tablet Take 20 mg by mouth daily.    Yes [provider]  metoprolol tartrate (LOPRESSOR) 25 MG tablet Take 1 tablet (25 mg total) by mouth 2 (two) times daily. Patient taking differently: Take 25 mg by mouth 2 (two) times daily after a meal.  01/04/20 04/03/20 Yes [name], [name], MD    nitroGLYCERIN (NITROSTAT) 0.4 MG SL tablet Place 1 tablet (0.4 mg total) under the tongue every 5 (five) minutes as needed for chest pain. 11/22/19 02/20/20 Yes [name], [name], MD  Prenatal Vit-Fe Fumarate-FA (PRENATAL MULTIVITAMIN) TABS tablet Take 1 tablet by mouth daily at 12 noon.   Yes [provider]  vitamin B-12 (CYANOCOBALAMIN) 1000 MCG tablet Take 2,500 mcg by mouth daily.   Yes [provider]     Vital Signs: BP 133/64   Pulse 67   Temp (!) 97.5 F (36.4 C) (Oral)   Resp 13   Ht 5\' 3"  (1.6 m)   Wt 156 lb 1.4 oz (70.8 kg)   SpO2 100%   BMI 27.65 kg/m   Physical Exam Vitals and nursing note reviewed.  Constitutional:      General: She is not in acute distress.    Comments: Intubated with sedation.  Pulmonary:     Effort: Pulmonary effort is normal. No respiratory distress.     Comments: Intubated with sedation. Skin:    General: Skin is warm and dry.     Comments: Right groin incision soft without active bleeding or hematoma.  Neurological:     Comments: Intubated with sedation. She opens eyes to name and follows some simple commands. PERRL bilaterally. Can wiggle bilateral toes and move LUE but no movements of RUE. Distal pulses 1+ bilaterally.     Imaging: CT Code Stroke CTA Head W/WO contrast  Result Date: 01/30/2020 CLINICAL DATA:  Stroke symptoms EXAM: CT ANGIOGRAPHY HEAD AND NECK CT PERFUSION BRAIN TECHNIQUE: Multidetector  CT imaging of the head and neck was performed using the standard protocol during bolus administration of intravenous contrast. Multiplanar CT image reconstructions and MIPs were obtained to evaluate the vascular anatomy. Carotid stenosis measurements (when applicable) are obtained utilizing NASCET criteria, using the distal internal carotid diameter as the denominator. Multiphase CT imaging of the brain was performed following IV bolus contrast injection. Subsequent parametric perfusion maps were calculated using RAPID  software. CONTRAST:  Dose is not available COMPARISON:  Head CT from earlier today FINDINGS: CTA NECK FINDINGS Aortic arch: Atherosclerosis.  Changes of recent cardiac surgery. Right carotid system: Atherosclerotic plaque primarily at the bifurcation with up to 40% stenosis Left carotid system: Advanced atheromatous narrowing at the ICA bulb, flow reducing and measuring 80% on sagittal reformats. No dissection. Vertebral arteries: No proximal subclavian stenosis. The right vertebral artery is dominant and diffusely patent. Proximal left vertebral occlusion with reconstitution and robust flow by the dura. 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 Other neck: No acute finding. Upper chest: Bilateral upper lobe subsegmental pulmonary emboli with central branching acute appearance. Review of the MIP images confirms the above findings CTA HEAD FINDINGS Anterior circulation: Left M1 occlusion. Atherosclerotic calcification of both carotid siphons with moderate narrowing at the left paraclinoid segment. Posterior circulation: The right vertebral artery is dominant. Diffuse atheromatous plaque seen along the left V4 segment with patency to the basilar. The basilar is smooth and diffusely patent. No PCA branch occlusion or flow limiting stenosis. Mild right P2 segment narrowing. Venous sinuses: Unremarkable in the arterial phase Anatomic variants: None significant Review of the MIP images confirms the above findings CT Brain Perfusion Findings: ASPECTS: 10 CBF (<30%) Volume: 60mL Perfusion (Tmax>6.0s) volume: 165mL Mismatch Volume: 85 cc of penumbramL Critical Value/emergent results were called by telephone at the time of interpretation on 01/30/2020 at 7:01 am to provider [facility] , who verbally acknowledged these results. IMPRESSION: 1. Emergent large vessel occlusion at the left M1 segment. 2. Aspects of 10 with CT perfusion suggesting core infarct of 74 cc and 85 cc of penumbra. 3. Flow reducing  left ICA bulb stenosis. 4. Bilateral subsegmental acute pulmonary emboli. 5. Proximal occlusion of the non dominant left vertebral artery with reconstitution at the dura. Electronically Signed   By: Monte Fantasia M.D.   On: 01/30/2020 07:04   DG Chest 2 View  Result Date: 01/27/2020 CLINICAL DATA:  Preop.  Chest pain. EXAM: CHEST - 2 VIEW COMPARISON:  Mar 25, 2005 FINDINGS: The heart size and mediastinal contours are within normal limits. Both lungs are clear. The visualized skeletal structures are unremarkable. IMPRESSION: No active cardiopulmonary disease. Electronically Signed   By: Constance Holster M.D.   On: 01/27/2020 00:29   CT Code Stroke CTA Neck W/WO contrast  Result Date: 01/30/2020 CLINICAL DATA:  Stroke symptoms EXAM: CT ANGIOGRAPHY HEAD AND NECK CT PERFUSION BRAIN TECHNIQUE: Multidetector CT imaging of the head and neck was performed using the standard protocol during bolus administration of intravenous contrast. Multiplanar CT image reconstructions and MIPs were obtained to evaluate the vascular anatomy. Carotid stenosis measurements (when applicable) are obtained utilizing NASCET criteria, using the distal internal carotid diameter as the denominator. Multiphase CT imaging of the brain was performed following IV bolus contrast injection. Subsequent parametric perfusion maps were calculated using RAPID software. CONTRAST:  Dose is not available COMPARISON:  Head CT from earlier today FINDINGS: CTA NECK FINDINGS Aortic arch: Atherosclerosis.  Changes of recent cardiac surgery. Right carotid system: Atherosclerotic plaque primarily  at the bifurcation with up to 40% stenosis Left carotid system: Advanced atheromatous narrowing at the ICA bulb, flow reducing and measuring 80% on sagittal reformats. No dissection. Vertebral arteries: No proximal subclavian stenosis. The right vertebral artery is dominant and diffusely patent. Proximal left vertebral occlusion with reconstitution and robust flow  by the dura. 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 Other neck: No acute finding. Upper chest: Bilateral upper lobe subsegmental pulmonary emboli with central branching acute appearance. Review of the MIP images confirms the above findings CTA HEAD FINDINGS Anterior circulation: Left M1 occlusion. Atherosclerotic calcification of both carotid siphons with moderate narrowing at the left paraclinoid segment. Posterior circulation: The right vertebral artery is dominant. Diffuse atheromatous plaque seen along the left V4 segment with patency to the basilar. The basilar is smooth and diffusely patent. No PCA branch occlusion or flow limiting stenosis. Mild right P2 segment narrowing. Venous sinuses: Unremarkable in the arterial phase Anatomic variants: None significant Review of the MIP images confirms the above findings CT Brain Perfusion Findings: ASPECTS: 10 CBF (<30%) Volume: 78mL Perfusion (Tmax>6.0s) volume: 149mL Mismatch Volume: 85 cc of penumbramL Critical Value/emergent results were called by telephone at the time of interpretation on 01/30/2020 at 7:01 am to provider [facility] , who verbally acknowledged these results. IMPRESSION: 1. Emergent large vessel occlusion at the left M1 segment. 2. Aspects of 10 with CT perfusion suggesting core infarct of 74 cc and 85 cc of penumbra. 3. Flow reducing left ICA bulb stenosis. 4. Bilateral subsegmental acute pulmonary emboli. 5. Proximal occlusion of the non dominant left vertebral artery with reconstitution at the dura. Electronically Signed   By: Monte Fantasia M.D.   On: 01/30/2020 07:04   CT Code Stroke Cerebral Perfusion with contrast  Result Date: 01/30/2020 CLINICAL DATA:  Stroke symptoms EXAM: CT ANGIOGRAPHY HEAD AND NECK CT PERFUSION BRAIN TECHNIQUE: Multidetector CT imaging of the head and neck was performed using the standard protocol during bolus administration of intravenous contrast. Multiplanar CT image reconstructions and  MIPs were obtained to evaluate the vascular anatomy. Carotid stenosis measurements (when applicable) are obtained utilizing NASCET criteria, using the distal internal carotid diameter as the denominator. Multiphase CT imaging of the brain was performed following IV bolus contrast injection. Subsequent parametric perfusion maps were calculated using RAPID software. CONTRAST:  Dose is not available COMPARISON:  Head CT from earlier today FINDINGS: CTA NECK FINDINGS Aortic arch: Atherosclerosis.  Changes of recent cardiac surgery. Right carotid system: Atherosclerotic plaque primarily at the bifurcation with up to 40% stenosis Left carotid system: Advanced atheromatous narrowing at the ICA bulb, flow reducing and measuring 80% on sagittal reformats. No dissection. Vertebral arteries: No proximal subclavian stenosis. The right vertebral artery is dominant and diffusely patent. Proximal left vertebral occlusion with reconstitution and robust flow by the dura. 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 Other neck: No acute finding. Upper chest: Bilateral upper lobe subsegmental pulmonary emboli with central branching acute appearance. Review of the MIP images confirms the above findings CTA HEAD FINDINGS Anterior circulation: Left M1 occlusion. Atherosclerotic calcification of both carotid siphons with moderate narrowing at the left paraclinoid segment. Posterior circulation: The right vertebral artery is dominant. Diffuse atheromatous plaque seen along the left V4 segment with patency to the basilar. The basilar is smooth and diffusely patent. No PCA branch occlusion or flow limiting stenosis. Mild right P2 segment narrowing. Venous sinuses: Unremarkable in the arterial phase Anatomic variants: None significant Review of the MIP images confirms the  above findings CT Brain Perfusion Findings: ASPECTS: 10 CBF (<30%) Volume: 46mL Perfusion (Tmax>6.0s) volume: 168mL Mismatch Volume: 85 cc of penumbramL  Critical Value/emergent results were called by telephone at the time of interpretation on 01/30/2020 at 7:01 am to provider [facility] , who verbally acknowledged these results. IMPRESSION: 1. Emergent large vessel occlusion at the left M1 segment. 2. Aspects of 10 with CT perfusion suggesting core infarct of 74 cc and 85 cc of penumbra. 3. Flow reducing left ICA bulb stenosis. 4. Bilateral subsegmental acute pulmonary emboli. 5. Proximal occlusion of the non dominant left vertebral artery with reconstitution at the dura. Electronically Signed   By: Monte Fantasia M.D.   On: 01/30/2020 07:04   DG CHEST PORT 1 VIEW  Result Date: 01/30/2020 CLINICAL DATA:  Intubation of airway EXAM: PORTABLE CHEST 1 VIEW COMPARISON:  01/30/2020 FINDINGS: Endotracheal tube tip 2 cm above the carina. NGT placed with the tip in the stomach. Right jugular central venous catheter tips unchanged. Two catheters in place in the right SVC. Bilateral chest tubes in place. Mediastinal drain in place. No pneumothorax. Right lower lobe atelectasis unchanged. Right hilar surgical clips again noted. No significant effusion. IMPRESSION: Endotracheal tube placed with the tip 2 cm above the carina. Right lower lobe atelectasis unchanged. Bilateral chest tubes without pneumothorax. Electronically Signed   By: Franchot Gallo M.D.   On: 01/30/2020 10:07   DG Chest Port 1 View  Result Date: 01/30/2020 CLINICAL DATA:  Chest tube, post open heart surgery EXAM: PORTABLE CHEST 1 VIEW COMPARISON:  Portable exam 0506 hours compared to 01/29/2020 FINDINGS: Mediastinal drain and BILATERAL thoracostomy tubes present. Two RIGHT jugular central venous catheters, tips projecting over SVC and cavoatrial junction. Epicardial pacing wires present. Enlargement of cardiac silhouette with pulmonary vascular congestion. Prominent mediastinum consistent with preceding surgery. Scattered atelectasis in the mid to lower lungs bilaterally with small RIGHT pleural  effusion. Tiny LEFT apex pneumothorax, decreased. Bones demineralized with LEFT glenohumeral degenerative changes noted as well as degenerative changes of the thoracic spine. IMPRESSION: Bibasilar atelectasis and RIGHT pleural effusion. Tiny LEFT apex pneumothorax despite thoracostomy tube, decreased. Electronically Signed   By: Lavonia Dana M.D.   On: 01/30/2020 07:55   DG Chest Port 1 View  Result Date: 01/29/2020 CLINICAL DATA:  Open heart surgery EXAM: PORTABLE CHEST 1 VIEW COMPARISON:  01/28/2020 FINDINGS: Interval extubation and removal of enteric tube. Interval removal of right IJ Swan-Ganz catheter. Stable right IJ venous sheath and additional right IJ venous catheter terminating in the upper right atrium. Bilateral chest tubes and mediastinal drain. Tiny left apical pneumothorax, new. Mild subcutaneous emphysema along the right pectoralis, unchanged. Cardiomegaly.  Postsurgical changes related to prior CABG. IMPRESSION: Postsurgical changes related to prior CABG. Tiny left apical pneumothorax, new. Stable bilateral chest tubes and mediastinal drain. Interval removal of endotracheal tube, enteric tube, and right IJ Swan-Ganz catheter. Electronically Signed   By: Julian Hy M.D.   On: 01/29/2020 07:30   DG Chest Port 1 View  Result Date: 01/28/2020 CLINICAL DATA:  Status post CABG EXAM: PORTABLE CHEST 1 VIEW COMPARISON:  Age yesterday FINDINGS: Swan-Ganz catheter tip is at the main pulmonary artery level. Right IJ line in good position. Chest drains are present with no visible pneumothorax. Endotracheal tube tip between the clavicular heads and carina, slightly advanced from before. The orogastric tube reaches the stomach. Interstitial coarsening that is likely atelectasis. No visible effusion. IMPRESSION: 1. Unremarkable hardware positioning. 2. Patchy atelectasis that is also stable. Electronically Signed  By: Monte Fantasia M.D.   On: 01/28/2020 08:49   DG Chest Port 1 View  Result  Date: 01/27/2020 CLINICAL DATA:  Status post coronary bypass grafting EXAM: PORTABLE CHEST 1 VIEW COMPARISON:  01/26/2011 FINDINGS: Cardiac shadow is within normal limits. Endotracheal tube and Swan-Ganz catheter are seen. Right jugular central line is noted at the cavoatrial junction. Gastric catheter is noted with the tip at the gastroesophageal junction. This should be advanced several cm into the stomach. Mediastinal drain and bilateral thoracostomy catheters are seen. No pneumothorax is noted. Patchy airspace opacities are noted which may represent edema. Mild platelike atelectasis is noted in the right base. IMPRESSION: Postsurgical changes with tubes and lines as described above. Patchy opacities are noted which may be related to parenchymal edema. No other focal abnormality is noted. Electronically Signed   By: Inez Catalina M.D.   On: 01/27/2020 20:32   DG Abd Portable 1V  Result Date: 01/28/2020 CLINICAL DATA:  79 year old female status post NG placement. EXAM: PORTABLE ABDOMEN - 1 VIEW COMPARISON:  Chest radiograph dated 01/27/2020. FINDINGS: An enteric tube is noted with tip in the left upper abdomen likely in the gastric fundus. Additional support apparatus in similar position. IMPRESSION: Enteric tube with tip in the gastric fundus. Electronically Signed   By: Anner Crete M.D.   On: 01/28/2020 01:11   ECHO INTRAOPERATIVE TEE  Result Date: 01/28/2020  *INTRAOPERATIVE TRANSESOPHAGEAL REPORT *  Patient Name:   [name] [name] Date of Exam: 01/27/2020 Medical Rec #:  [id number]          Height:       63.0 in Accession #:    [id number]         Weight:       144.0 lb Date of Birth:  [birth year]          BSA:          1.68 m Patient Age:    79 years           BP:           142/56 mmHg Patient Gender: F                  HR:           50 bpm. Exam Location:  Anesthesiology Transesophogeal exam was perform intraoperatively during surgical procedure. Patient was closely monitored under general  anesthesia during the entirety of examination. Indications:     Coronary artery disease Sonographer:     Dustin Flock RDCS Performing Phys: [id number] Diagnosing Phys: Roberts Gaudy MD Complications: No known complications during this procedure. PRE-OP FINDINGS  Left Ventricle: The LV cavity was normal in size. There was normal LV systolic function with the ejection fraction estimated at 60-65%. There were no regional wall moton abnormalities. There was mild concentric LV hypertrophy. The PW and antero-septum each measured 1.10 at end-diastole at the mid-papillary level. On the post-bypass exam, there was normal LV systolic function with no regional wall motion abnormalities. Right Ventricle: The right ventricle has normal systolic function. The cavity was mild to moderately enlarged. There is no increase in right ventricular wall thickness. Left Atrium: Left atrial size was normal in size. The left atrial appendage is well visualized and there is evidence of thrombus present. Right Atrium: Right atrial pressure is estimated at 10 mmHg. The right atrium was enlarged and measured 4.0 cm in the medial-lateral dimension. Interatrial Septum: There was a patent foramen ovale present at the  supeior aspect of the fossa ovalis. There was continuous left to right flow. Pericardium: There is no evidence of pericardial effusion. Tricuspid Valve: The tricuspid annulus was moderately enlarged and measured 4.0 cm in the four chamber view. 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 Aortic Valve: The aortic valve is tricuspid There is mild calcification of the aortic valve. Aortic valve regurgitation was not visualized by color flow Doppler. The aortic valve leaflets were mildly thickened. There was no aortic insufficiency. The peak  transaortic velocity was 1.9 m/sec. The peak gradient was 15 mm hg and the mean gradient was 6 mm hg. Pulmonic Valve: The pulmonic valve was normal in structure,  with normal. Pulmonic valve regurgitation is trivial by color flow Doppler. Aorta: The ascending aorta was normal in size without dilatation or effacement. There was mild intimal thickening without protruding atheromatous disease. +--------------+--------++ LEFT VENTRICLE         +--------------+--------++ PLAX 2D                +--------------+--------++ LVOT diam:    1.90 cm  +--------------+--------++ LVOT Area:    2.84 cm +--------------+--------++                        +--------------+--------++ +------------------+------------++ AORTIC VALVE                   +------------------+------------++ AV Area (Vmax):   1.60 cm     +------------------+------------++ AV Area (Vmean):  2.11 cm     +------------------+------------++ AV Area (VTI):    1.29 cm     +------------------+------------++ AV Vmax:          191.00 cm/s  +------------------+------------++ AV Vmean:         106.000 cm/s +------------------+------------++ AV VTI:           0.495 m      +------------------+------------++ AV Peak Grad:     14.6 mmHg    +------------------+------------++ AV Mean Grad:     6.0 mmHg     +------------------+------------++ LVOT Vmax:        108.00 cm/s  +------------------+------------++ LVOT Vmean:       78.950 cm/s  +------------------+------------++ LVOT VTI:         0.226 m      +------------------+------------++ LVOT/AV VTI ratio:0.46         +------------------+------------++  +--------------+-------+ SHUNTS                +--------------+-------+ Systemic VTI: 0.23 m  +--------------+-------+ Systemic Diam:1.90 cm +--------------+-------+  Roberts Gaudy MD Electronically signed by Roberts Gaudy MD Signature Date/Time: 01/28/2020/11:34:05 AM    Final    VAS US DOPPLER PRE CABG  Result Date: 01/28/2020 PREOPERATIVE VASCULAR EVALUATION  Indications:      Pre-CABG. Risk Factors:     Hypertension. Comparison Study: No prior studies.  Performing Technologist: Maudry Mayhew RDMS, RVT, RDCS  Examination Guidelines: A complete evaluation includes B-mode imaging, spectral Doppler, color Doppler, and power Doppler as needed of all accessible portions of each vessel. Bilateral testing is considered an integral part of a complete examination. Limited examinations for reoccurring indications may be performed as noted.  Left Carotid Findings: +----------+--------+--------+--------+------------+ SubclavianPSV cm/sEDV cm/sDescribeArm Pressure +----------+--------+--------+--------+------------+                                   160          +----------+--------+--------+--------+------------+  ABI Findings: +--------+------------------+-----+--------+-------+ Left    Lt Pressure (mmHg)IndexWaveformComment +--------+------------------+-----+--------+-------+ Brachial160                                    +--------+------------------+-----+--------+-------+  Right Doppler Findings: +-----------+--------+-----+---------+--------+ Site       PressureIndexDoppler  Comments +-----------+--------+-----+---------+--------+ Radial                  triphasic         +-----------+--------+-----+---------+--------+ Ulnar                   triphasic         +-----------+--------+-----+---------+--------+ Palmar Arch                      TR band  +-----------+--------+-----+---------+--------+  Left Doppler Findings: +--------+--------+-----+---------+--------+ Site    PressureIndexDoppler  Comments +--------+--------+-----+---------+--------+ [id number]                            +--------+--------+-----+---------+--------+ Radial               triphasic         +--------+--------+-----+---------+--------+ Ulnar                triphasic         +--------+--------+-----+---------+--------+  Left Upper Extremity: Doppler waveforms remain within normal limits with left radial compression. Doppler  waveforms remain within normal limits with left ulnar compression.  Electronically signed by Harold Barban MD on 01/28/2020 at 7:09:45 PM.   Final    CT HEAD CODE STROKE WO CONTRAST  Result Date: 01/30/2020 CLINICAL DATA:  Code stroke. Ataxia. Less responsive. Left-sided gaze. EXAM: CT HEAD WITHOUT CONTRAST TECHNIQUE: Contiguous axial images were obtained from the base of the skull through the vertex without intravenous contrast. COMPARISON:  Brain MRI 09/06/2018 FINDINGS: Brain: No evidence of acute infarction, hemorrhage, hydrocephalus, extra-axial collection or mass lesion/mass effect. Vascular: Hyperdense left M1 segment Skull: Negative Sinuses/Orbits: Negative Other: These results were communicated to Dr. Rory Percy at 6:35 amon 3/15/2021by text page via the [facility] messaging system. ASPECTS [facility] Stroke Program Early CT Score) - Ganglionic level infarction (caudate, lentiform nuclei, internal capsule, insula, M1-M3 cortex): 7 - Supraganglionic infarction (M4-M6 cortex): 3 Total score (0-10 with 10 being normal): 10 IMPRESSION: 1. Hyperdense left M1 segment.  CTA and perfusion are in progress. 2. ASPECTS is 10. Electronically Signed   By: Monte Fantasia M.D.   On: 01/30/2020 06:38    Labs:  CBC: Recent Labs    01/28/20 0915 01/28/20 1244 01/28/20 1829 01/29/20 0354 01/30/20 0438 01/30/20 1200  WBC 9.7  --  12.3* 15.6* 13.0*  --   HGB 10.9*   < > 11.3* 11.1* 10.2* 9.2*  HCT 32.2*   < > 33.2* 33.6* 31.1* 27.0*  PLT 153  --  158 184 165  --    < > = values in this interval not displayed.    COAGS: Recent Labs    01/27/20 0524 01/27/20 2048 01/28/20 0625 01/30/20 0955  INR 1.0 1.5* 1.1 1.1  APTT 76* 33 30  --     BMP: Recent Labs    01/28/20 0625 01/28/20 1244 01/28/20 1829 01/29/20 0354 01/30/20 0438 01/30/20 1200  NA 142   < > 138 138 137 138  K 3.4*   < > 3.8 4.3 3.8 3.9  CL 110  --  110 109 101  --   CO2 24  --  21* 22 28  --   GLUCOSE 159*  --  124* 129* 112*  --     BUN 12  --  13 14 19   --   CALCIUM 8.8*  --  9.6 9.8 9.6  --   CREATININE 0.80  --  0.89 1.00 1.19*  --   GFRNONAA >60  --  >60 NOT CALCULATED 44*  --   GFRAA >60  --  >60 NOT CALCULATED 51*  --    < > = values in this interval not displayed.     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 Karenann Cai. Patient's condition stable- intubated with sedation, follows some simple commands, can wiggle bilateral toes and move LUE but no movements of RUE. Right groin incision stable, distal pulses 1+ bilaterally. Further plans per TCTS/neurology/CCM- appreciate and agree with management. NIR to follow.   Electronically Signed: Earley Abide, PA-C 01/30/2020, 2:11 PM   I spent a total of 25 Minutes at the the patient's bedside AND on the patient's hospital floor or unit, greater than 50% of which was counseling/coordinating care for left MCA M1 occlusion s/p revascularization.

## 2020-01-30 NOTE — Progress Notes (Signed)
Initial Nutrition Assessment  DOCUMENTATION CODES:   Not applicable  INTERVENTION:   If pt remains on vent and unable to extubate recommend initiate enteral nutrition therapy.   Recommend initiate Vital AF 1.2 @ 45 ml/hr (1080 ml/day) via OG tube 30 ml Prostat daily  Provides: 1396 kcal, 96 grams protein, and 875 ml free water.    NUTRITION DIAGNOSIS:   Inadequate oral intake related to inability to eat as evidenced by NPO status.  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   3/15 during ambulating pt developed sudden R hemiplegia, CTA showed M1 occlusion s/p IR for thrombectomy. Also noted during CTA that pt has bilateral acute pulmonary emboli. Chest tubes in place Per CCM possible extubation later if mentation allows (she did not tolerate extubation attempts after procedure today). Currently off cleviprex   Patient is currently intubated on ventilator support MV: 9.4 L/min Temp (24hrs), Avg:97.5 F (36.4 C), Min:96.6 F (35.9 C), Max:98 F (36.7 C)  Medications reviewed and include: dulcolax, colace, SSI 10 mEq KCl x 3 Lasix x 1 IV Labs reviewed CBG's: 117-131  UOP: 2080 ml x 24 hrs CT: 300 ml  Pt positive 2 L since admission   16 F OG tube in place, tip in stomach per chest xray   NUTRITION - FOCUSED PHYSICAL EXAM: Most sights assessed on L side due to nursing care in progress.     Most Recent Value  Orbital Region  No depletion  Upper Arm Region  No depletion  Thoracic and Lumbar Region  No depletion  Buccal Region  Unable to assess  Temple Region  No depletion  Clavicle Bone Region  No depletion  Clavicle and Acromion Bone Region  No depletion  Scapular Bone Region  Unable to assess  Dorsal Hand  Unable to  assess  Patellar Region  No depletion  Anterior Thigh Region  No depletion  Posterior Calf Region  No depletion  Edema (RD Assessment)  Moderate  Hair  Reviewed  Eyes  Unable to assess  Mouth  Unable to assess  Skin  Reviewed  Nails  Unable to assess       Diet Order:   Diet Order            Diet NPO time specified  Diet effective now              EDUCATION NEEDS:   No education needs have been identified at this time  Skin:  Skin Assessment: Reviewed RN Assessment  Last BM:  3/10  Height:   Ht Readings from Last 1 Encounters:  01/27/20 5\' 3"  (1.6 m)    Weight:   Wt Readings from Last 1 Encounters:  01/30/20 70.8 kg    Ideal Body Weight:  52.2 kg  BMI:  Body mass index is 27.65 kg/m.  Estimated Nutritional Needs:   Kcal:  1315  Protein:  95-110 grams  Fluid:  >1.5 L/day  Lockie Pares., RD, LDN, CNSC See AMiON for contact information

## 2020-01-30 NOTE — Progress Notes (Signed)
NAME:  [name] [name], MRN:  [id number], DOB:  01/10/1941, LOS: 4 ADMISSION DATE:  01/26/2020, CONSULTATION DATE:  01/30/2020 REFERRING MD:  Rory Percy CHIEF COMPLAINT:  Right hemiplegia   Brief History        This 79 year old was admitted on 3/8 for evaluation of chest pain.  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  She was up ambulating today and suddenly developed a right hemiplegia.  CTA showed an M1 occlusion and thrombectomy was performed.  An attempt was made to extubate the patient post procedure but this was not tolerated.  Of note at the time the CTA was performed she was also found to have pulmonary emboli.  History of present illness       History is as noted above with the following additions.  Intraoperative TEE showed essentially normal LV systolic dysfunction there was some LVH and a PFO with a left to right flow.  There is no thrombus noted in the left atrial appendage.  Past Medical History  Thyroid cancer, hypertension, hyperlipidemia.  Significant Hospital Events   Cardiac cath 3/12, CABG 3/13  Consults:  Thoracic surgery, neurology, interventional radiology.  Procedures:  Four-vessel CABG, left M1 thrombectomy  Significant Diagnostic Tests:    Micro Data:  None  Antimicrobials:  None   Interim history/subjective:  As above Objective   Blood pressure (!) 112/40, pulse 69, temperature (!) 97.5 F (36.4 C), temperature source Oral, resp. rate 14, height 5\' 3"  (1.6 m), weight 70.8 kg, SpO2 100 %.    Vent Mode: PRVC FiO2 (%):  [100 %] 100 % Set Rate:  [15 bmp] 15 bmp Vt Set:  [450 mL] 450 mL PEEP:  [5 cmH20] 5 cmH20 Plateau Pressure:  [23 cmH20] 23 cmH20   Intake/Output Summary (Last 24 hours) at 01/30/2020 0943 Last data filed at 01/30/2020 0911 Gross per 24 hour  Intake 1550.69 ml  Output 2430 ml  Net -879.31 ml   Filed Weights   01/28/20 0500 01/29/20 0500 01/30/20 0600  Weight:  70.8 kg 68.9 kg 70.8 kg    Examination: General: The patient is orally intubated and sedated on 0.6 of Precedex at the time of my examination.  HENT: Unremarkable Lungs: Respirations are unlabored, there is symmetric air movement, no wheezes.  Chest tubes are in place there is no significant drainage at present time Cardiovascular: S1 and S2 are regular without murmur rub or gallop telemetry is showing a 100% atrial paced rhythm.   Abdomen: Soft and nontender without organomegaly masses tenderness guarding or rebound there is no ecchymosis or swelling at the right groin access site Extremities: He has 2+ lower extremity edema symmetrically Neuro: She does have some spontaneous eye opening she does not track or follow instructions for me.  Pupils are equal face is symmetric she moves all fours to noxious stimuli but seems to be somewhat less vigorous on the right GU: Foley is in place  Resolved Hospital Problem list     Assessment & Plan:  1.  Status post thrombectomy of a left MCA occlusion.  Currently she is on aspirin and Plavix as well as a statin.  I will need to discuss with neurology whether they feel safe fully anticoagulating this patient in light of the fact that she is also been found to have pulmonary emboli. 2.  Pulmonary emboli.  This is distinctly unusual in a post bypass patient.  I am concerned that she may  have a acquired thrombophilia with clots in both the left and right side of the circulation.  She screened negative for Covid on 3/8.  I have sent a HIT screen, lupus anticoagulant screen, and DIC panel. 3.  Respiratory failure.  We will be holding her elation entirely later and evaluating her mental status, if appropriate we will to proceed to extubation.  A blood gas to evaluate gas exchange with the PE is pending 4. Chronic renal insufficiency.  She just received a dye load, should her creatinine rise we may need to mitigate the rate at which we diurese her towards her  baseline weight.  Best practice:  Diet: N.p.o. Pain/Anxiety/Delirium protocol (if indicated):  VAP protocol (if indicated): Indicated but currently supine and flat due to angiography DVT prophylaxis: Decision regarding full anticoagulation pending discussion with neurology GI prophylaxis: Protonix ordered Glucose control:  Mobility: Bedrest  code Status: Full Family Communication: Unfortunately appears that we do not have a surrogate for this patient Disposition:   Labs   CBC: Recent Labs  Lab 01/27/20 2048 01/27/20 2048 01/28/20 0915 01/28/20 0915 01/28/20 1244 01/28/20 1545 01/28/20 1829 01/29/20 0354 01/30/20 0438  WBC 10.8*  --  9.7  --   --   --  12.3* 15.6* 13.0*  HGB 11.0*   < > 10.9*   < > 10.5* 10.5* 11.3* 11.1* 10.2*  HCT 33.6*   < > 32.2*   < > 31.0* 31.0* 33.2* 33.6* 31.1*  MCV 91.1  --  88.5  --   --   --  88.8 90.8 92.0  PLT 137*  --  153  --   --   --  158 184 165   < > = values in this interval not displayed.    Basic Metabolic Panel: Recent Labs  Lab 01/27/20 0524 01/27/20 1448 01/27/20 1843 01/27/20 2001 01/28/20 0625 01/28/20 0625 01/28/20 1244 01/28/20 1545 01/28/20 1829 01/29/20 0354 01/30/20 0438  NA 138   < > 136   < > 142   < > 143 142 138 138 137  K 3.8   < > 4.4   < > 3.4*   < > 4.0 4.0 3.8 4.3 3.8  CL 105   < > 103  --  110  --   --   --  110 109 101  CO2 21*  --   --   --  24  --   --   --  21* 22 28  GLUCOSE 98   < > 106*  --  159*  --   --   --  124* 129* 112*  BUN 14   < > 13  --  12  --   --   --  13 14 19   CREATININE 0.86   < > 0.50  --  0.80  --   --   --  0.89 1.00 1.19*  CALCIUM 10.1  --   --   --  8.8*  --   --   --  9.6 9.8 9.6  MG  --   --   --   --  2.9*  --   --   --  2.4  --   --    < > = values in this interval not displayed.   GFR: Estimated Creatinine Clearance: 38 mL/min (A) (by C-G formula based on SCr of 1.19 mg/dL (H)). Recent Labs  Lab 01/28/20 0915 01/28/20 1829 01/29/20 0354 01/30/20 0438  WBC 9.7  12.3*  15.6* 13.0*    Liver Function Tests: No results for input(s): AST, ALT, ALKPHOS, BILITOT, PROT, ALBUMIN in the last 168 hours. No results for input(s): LIPASE, AMYLASE in the last 168 hours. No results for input(s): AMMONIA in the last 168 hours.  ABG    Component Value Date/Time   PHART 7.357 01/28/2020 1545   PCO2ART 42.2 01/28/2020 1545   PO2ART 87.0 01/28/2020 1545   HCO3 23.9 01/28/2020 1545   TCO2 25 01/28/2020 1545   ACIDBASEDEF 2.0 01/28/2020 1545   O2SAT 97.0 01/28/2020 1545     Coagulation Profile: Recent Labs  Lab 01/27/20 0524 01/27/20 2048 01/28/20 0625  INR 1.0 1.5* 1.1    Cardiac Enzymes: No results for input(s): CKTOTAL, CKMB, CKMBINDEX, TROPONINI in the last 168 hours.  HbA1C: Hgb A1c MFr Bld  Date/Time Value Ref Range Status  01/27/2020 05:24 AM 5.1 4.8 - 5.6 % Final    Comment:    (NOTE) Pre diabetes:          5.7%-6.4% Diabetes:              >6.4% Glycemic control for   <7.0% adults with diabetes     CBG: Recent Labs  Lab 01/29/20 1624 01/29/20 1947 01/29/20 2350 01/30/20 0419 01/30/20 0613  GLUCAP 124* 130* 119* 114* 117*    Review of Systems:   Not obtainable  Past Medical History  She,  has a past medical history of Coronary artery disease, Family history of adverse reaction to anesthesia, HTN (hypertension), Hyperlipidemia, Hypothyroidism, Osteoporosis, and Thyroid carcinoma ([place]).   Surgical History    Past Surgical History:  Procedure Laterality Date  . ABDOMINAL HYSTERECTOMY    . ANKLE SURGERY    . APPENDECTOMY    . CARDIAC CATHETERIZATION  01/25/2010  . CHOLECYSTECTOMY    . gamma knife    . LEFT HEART CATH AND CORONARY ANGIOGRAPHY N/A 01/26/2020   Procedure: LEFT HEART CATH AND CORONARY ANGIOGRAPHY;  Surgeon: Belva Crome, MD;  Location: [facility];  Service: Cardiovascular;  Laterality: N/A;  . THYROIDECTOMY    . TOTAL HIP ARTHROPLASTY       Social History   reports that she has never smoked.  She has never used smokeless tobacco. She reports previous alcohol use. She reports that she does not use drugs.   Family History   Her family history includes Breast cancer (age of onset: 30) in her mother; Cancer in her brother; Cirrhosis in her father; Diabetes in her mother; Heart attack in her sister; Heart disease in her brother and mother; Hypertension in her mother.   Allergies Allergies  Allergen Reactions  . Bee Venom Swelling    Massive swelling  . Penicillins Other (See Comments)    UNSPECIFIED REACTION  Did it involve swelling of the face/tongue/throat, SOB, or low BP? Unknown Did it involve sudden or severe rash/hives, skin peeling, or any reaction on the inside of your mouth or nose? Unknown Did you need to seek medical attention at a hospital or doctor's office? Unknown When did it last happen?1945 If all above answers are "NO", may proceed with cephalosporin use.  . Adhesive [Tape] Rash  . Morphine And Related Rash  . Sulfa Antibiotics Nausea And Vomiting and Rash     Home Medications  Prior to Admission medications   Medication Sig Start Date End Date Taking? Authorizing Provider  acetaminophen (TYLENOL) 500 MG tablet Take 1,000 mg by mouth in the morning and at bedtime.    Yes  [provider]  aspirin EC 81 MG tablet Take 81 mg by mouth at bedtime.   Yes [provider]  atorvastatin (LIPITOR) 80 MG tablet Take 80 mg by mouth daily at 12 noon.    Yes [provider]  cholecalciferol (VITAMIN D) 1000 UNITS tablet Take 1,000 Units by mouth daily at 12 noon.    Yes [provider]  fluticasone (FLONASE) 50 MCG/ACT nasal spray Place 2 sprays into both nostrils daily. 12/19/19  Yes [provider]  levothyroxine (SYNTHROID, LEVOTHROID) 200 MCG tablet Take 100-200 mcg by mouth See admin instructions. Take 200 mcg  SUN-TUES-THUR-SAT Take 100 mcg tab MON-WED-FRI  DO NOT USE GENERIC   Yes [provider]  lisinopril  (ZESTRIL) 20 MG tablet Take 20 mg by mouth daily.    Yes [provider]  metoprolol tartrate (LOPRESSOR) 25 MG tablet Take 1 tablet (25 mg total) by mouth 2 (two) times daily. Patient taking differently: Take 25 mg by mouth 2 (two) times daily after a meal.  01/04/20 04/03/20 Yes [name], [name], MD  nitroGLYCERIN (NITROSTAT) 0.4 MG SL tablet Place 1 tablet (0.4 mg total) under the tongue every 5 (five) minutes as needed for chest pain. 11/22/19 02/20/20 Yes [name], [name], MD  Prenatal Vit-Fe Fumarate-FA (PRENATAL MULTIVITAMIN) TABS tablet Take 1 tablet by mouth daily at 12 noon.   Yes [provider]  vitamin B-12 (CYANOCOBALAMIN) 1000 MCG tablet Take 2,500 mcg by mouth daily.   Yes [provider]     Critical care time: Greater than 32 minutes was spent in the care of this acutely ill patient today    Lars Masson, MD Critical Care Medicine

## 2020-01-30 NOTE — Anesthesia Procedure Notes (Signed)
Arterial Line Insertion Start/End3/15/2021 7:10 AM, 01/30/2020 7:13 AM Performed by: Verdie Drown, CRNA, CRNA  Patient location: OOR procedure area. Preanesthetic checklist: patient identified, IV checked, monitors and equipment checked and pre-op evaluation Right, radial was placed Catheter size: 20 G Maximum sterile barriers used   Attempts: 1 Procedure performed without using ultrasound guided technique. Following insertion, dressing applied and Biopatch. Post procedure assessment: normal  Patient tolerated the procedure well with no immediate complications.

## 2020-01-30 NOTE — Progress Notes (Signed)
PT Cancellation Note  Patient Details Name: [name] [name] MRN: [id number] DOB: 1941/09/30   Cancelled Treatment:    Reason Eval/Treat Not Completed: Medical issues which prohibited therapy  Remains intubated and on flat bedrest.    Arby Barrette, PT Pager [phone]  Rexanne Mano 01/30/2020, 12:32 PM

## 2020-01-30 NOTE — Brief Op Note (Signed)
01/26/2020 - 01/30/2020  8:22 AM  PATIENT:  [name] [name]  79 y.o. female  PRE-OPERATIVE DIAGNOSIS:  Stroke - Left M1 occlusion  POST-OPERATIVE DIAGNOSIS:  Stroke - Left M1 occlusion  PROCEDURE: Diagnostic cerebral angiogram + mechanical thrombectomy  SURGEON:  Dr. Pedro Earls  PHYSICIAN ASSISTANT: None   ASSISTANTS: none   ANESTHESIA:   general  EBL:  200 mL   BLOOD ADMINISTERED:none  DRAINS: none   LOCAL MEDICATIONS USED:  NONE  SPECIMEN:  No Specimen  DISPOSITION OF SPECIMEN:  N/A  COUNTS:  YES  TOURNIQUET:  NO  FINDINGS: There was a proximal left M1/MCA occlusion. Mechanical thrombectomy performed with 6 mm solitaire + aspiration. Total 1 pass with complete recanalization (TICI3). No hemorrhage on post procedural flat panel CT.  DICTATION: .Other Dictation: Dictation Number PACS dictation  PLAN OF CARE: Admit to inpatient   PATIENT DISPOSITION:  ICU - extubated and stable.

## 2020-01-30 NOTE — Progress Notes (Signed)
At 0600 this morning patient was ambulating with RN and NT on unit hall. Patient walked some few steps and looked dizzy with knee buckling. RN and NT immediately sat pt down in her recliner chair on the hallway. Patient looked faint and when asked if she was okay stated she felt fine and wanted to continue the walk. RN encouraged her to rest a little based of how faint she looked. Few minutes later patient became more faint and looked like she was about to pass out. Patient became non verbal, less responsive and unable to follow commands. She had right arm weakness with gaze fixed to the left. Patient was rushed emergently to her room and code stroke was called by [facility] with verbal order also obtained from Dr. Orvan Seen. Patient had palpable pulses, CBG 117, vitals WDNL.  O2 sats upon arrival to room was in the 80s and pt was  placed on a non-rebreather mask. Patient was emergently rushed to head CT.

## 2020-01-30 NOTE — Progress Notes (Signed)
SLP Cancellation Note  Patient Details Name: [name] [name] MRN: [id number] DOB: 11-18-1940   Cancelled treatment:       Reason Eval/Treat Not Completed: Medical issues which prohibited therapy - intubated. Will f/u for readiness.    Osie Bond., M.A. [place] [facility] Pager [phone] Office [phone]  01/30/2020, 11:48 AM

## 2020-01-30 NOTE — Code Documentation (Signed)
79 yo female s/p CABG who was ambulating on the department at 0600 when her knees buckled and she felt lightheaded. She nonverbal and gaze to the left. Pt transported to CTH/A/P. Neuro exam per Dr. Rory Percy. Pt with Left MCA syndrome. To IR for mechanical thrombectomy per Dr. Estanislado Pandy. Emergent consent due to no NOK witnessed by [employer] and myself.

## 2020-01-30 NOTE — Progress Notes (Signed)
CT surgery p.m. Rounds  Patient resting comfortably on ventilator Sinus rhythm Ultrasound duplex of legs show DVT left leg Currently on IV heparin dosed per pharmacy

## 2020-01-30 NOTE — Anesthesia Procedure Notes (Signed)
Procedure Name: Intubation Date/Time: 01/30/2020 8:42 AM Performed by: Orlie Dakin, CRNA Pre-anesthesia Checklist: Patient identified, Emergency Drugs available, Suction available and Patient being monitored Patient Re-evaluated:Patient Re-evaluated prior to induction Oxygen Delivery Method: Circle system utilized Preoxygenation: Pre-oxygenation with 100% oxygen Induction Type: IV induction Laryngoscope Size: Miller and 3 Grade View: Grade I Tube type: Oral Tube size: 8.0 mm Number of attempts: 1 Airway Equipment and Method: Stylet Placement Confirmation: ETT inserted through vocal cords under direct vision,  positive ETCO2 and breath sounds checked- equal and bilateral Secured at: 23 cm Tube secured with: Tape Dental Injury: Teeth and Oropharynx as per pre-operative assessment

## 2020-01-30 NOTE — Progress Notes (Signed)
Report to 4N RN Jerene Pitch

## 2020-01-30 NOTE — Transfer of Care (Signed)
Immediate Anesthesia Transfer of Care Note  Patient: [name] [name]  Procedure(s) Performed: IR WITH ANESTHESIA (N/A )  Patient Location: NICU  Anesthesia Type:General  Level of Consciousness: sedated and Patient remains intubated per anesthesia plan  Airway & Oxygen Therapy: Patient Spontanous Breathing and Patient placed on Ventilator (see vital sign flow sheet for setting)  Post-op Assessment: Report given to RN and Post -op Vital signs reviewed and stable  Post vital signs: Reviewed and stable  Last Vitals:  Vitals Value Taken Time  BP 135/46 01/30/20 0904  Temp    Pulse 69 01/30/20 0906  Resp 22 01/30/20 0906  SpO2 97 % 01/30/20 0906  Vitals shown include unvalidated device data.  Last Pain:  Vitals:   01/30/20 0400  TempSrc: Oral  PainSc: 0-No pain         Complications: No apparent anesthesia complications

## 2020-01-30 NOTE — Progress Notes (Signed)
  Echocardiogram 2D Echocardiogram has been performed.  [name] [name] M 01/30/2020, 1:13 PM

## 2020-01-30 NOTE — Plan of Care (Addendum)
Patient responding to voice and pain. Pupils brisk and reactive but unable to track. No fixed gaze and able to blink eyes on command. Purposeful movement on LUE, RLE, LLE with weakness. Able to move Right hand purposefully and intermittently on command but still weak and unable to move the entire arm. UTA speech, cognitive function and orientation due to being intubated. Hemodynamically stable at this time. Will continue to monitor.   Problem: Clinical Measurements: Goal: Postoperative complications will be avoided or minimized Outcome: Progressing Problem: Clinical Measurements: Goal: Cardiovascular complication will be avoided 01/30/2020 2330 by Katha Hamming, RN Outcome: Progressing 01/30/2020 2329 by Katha Hamming, RN Outcome: Progressing   Problem: Clinical Measurements: Goal: Diagnostic test results will improve 01/30/2020 2330 by Katha Hamming, RN Outcome: Progressing 01/30/2020 2329 by Katha Hamming, RN Outcome: Progressing   Problem: Clinical Measurements: Goal: Ability to maintain clinical measurements within normal limits will improve 01/30/2020 2330 by Katha Hamming, RN Outcome: Progressing 01/30/2020 2329 by Katha Hamming, RN Outcome: Progressing

## 2020-01-30 NOTE — Addendum Note (Signed)
Addendum  created 01/30/20 1328 by Orlie Dakin, CRNA   Flowsheet accepted, Intraprocedure Flowsheets edited

## 2020-01-30 NOTE — Progress Notes (Signed)
OT Cancellation Note  Patient Details Name: [name] [name] MRN: [id number] DOB: 1941/01/31   Cancelled Treatment:    Reason Eval/Treat Not Completed: Medical issues which prohibited therapy (Recently transfer from [facility] and return from IR. Bedrest and must maintain flat position. Will return as schedule allows. Thank you.)  [place], OTR/L Acute Rehab Pager: [phone] Office: [phone] 01/30/2020, 9:35 AM

## 2020-01-31 ENCOUNTER — Inpatient Hospital Stay (HOSPITAL_COMMUNITY): Payer: PPO

## 2020-01-31 DIAGNOSIS — R4182 Altered mental status, unspecified: Secondary | ICD-10-CM

## 2020-01-31 LAB — GLUCOSE, CAPILLARY
Glucose-Capillary: 120 mg/dL — ABNORMAL HIGH (ref 70–99)
Glucose-Capillary: 136 mg/dL — ABNORMAL HIGH (ref 70–99)
Glucose-Capillary: 116 mg/dL — ABNORMAL HIGH (ref 70–99)
Glucose-Capillary: 120 mg/dL — ABNORMAL HIGH (ref 70–99)
Glucose-Capillary: 128 mg/dL — ABNORMAL HIGH (ref 70–99)
Glucose-Capillary: 122 mg/dL — ABNORMAL HIGH (ref 70–99)

## 2020-01-31 LAB — POCT I-STAT 7, (LYTES, BLD GAS, ICA,H+H)
Acid-Base Excess: 3 mmol/L — ABNORMAL HIGH (ref 0.0–2.0)
Bicarbonate: 27.4 mmol/L (ref 20.0–28.0)
Calcium, Ion: 1.39 mmol/L (ref 1.15–1.40)
HCT: 30 % — ABNORMAL LOW (ref 36.0–46.0)
Hemoglobin: 10.2 g/dL — ABNORMAL LOW (ref 12.0–15.0)
O2 Saturation: 96 %
Patient temperature: 98.6
Potassium: 3.5 mmol/L (ref 3.5–5.1)
Sodium: 139 mmol/L (ref 135–145)
TCO2: 29 mmol/L (ref 22–32)
pCO2 arterial: 41.4 mmHg (ref 32.0–48.0)
pH, Arterial: 7.429 (ref 7.350–7.450)
pO2, Arterial: 78 mmHg — ABNORMAL LOW (ref 83.0–108.0)

## 2020-01-31 LAB — BASIC METABOLIC PANEL
Anion gap: 9 (ref 5–15)
BUN: 14 mg/dL (ref 8–23)
CO2: 25 mmol/L (ref 22–32)
Calcium: 9.5 mg/dL (ref 8.9–10.3)
Chloride: 106 mmol/L (ref 98–111)
Creatinine, Ser: 0.93 mg/dL (ref 0.44–1.00)
GFR calc Af Amer: >60 mL/min (ref >60)
GFR calc non Af Amer: 60 mL/min — ABNORMAL LOW (ref >60)
Glucose, Bld: 120 mg/dL — ABNORMAL HIGH (ref 70–99)
Potassium: 3.7 mmol/L (ref 3.5–5.1)
Sodium: 140 mmol/L (ref 135–145)

## 2020-01-31 LAB — CBC WITH DIFFERENTIAL/PLATELET
Abs Immature Granulocytes: 0.1 10*3/uL — ABNORMAL HIGH (ref 0.00–0.07)
Basophils Absolute: 0 10*3/uL (ref 0.0–0.1)
Basophils Relative: 0 %
Eosinophils Absolute: 0 10*3/uL (ref 0.0–0.5)
Eosinophils Relative: 0 %
HCT: 29.2 % — ABNORMAL LOW (ref 36.0–46.0)
Hemoglobin: 9.6 g/dL — ABNORMAL LOW (ref 12.0–15.0)
Immature Granulocytes: 1 %
Lymphocytes Relative: 13 %
Lymphs Abs: 1.5 10*3/uL (ref 0.7–4.0)
MCH: 30.1 pg (ref 26.0–34.0)
MCHC: 32.9 g/dL (ref 30.0–36.0)
MCV: 91.5 fL (ref 80.0–100.0)
Monocytes Absolute: 0.5 10*3/uL (ref 0.1–1.0)
Monocytes Relative: 5 %
Neutro Abs: 9.2 10*3/uL — ABNORMAL HIGH (ref 1.7–7.7)
Neutrophils Relative %: 81 %
Platelets: 182 10*3/uL (ref 150–400)
RBC: 3.19 MIL/uL — ABNORMAL LOW (ref 3.87–5.11)
RDW: 14.5 % (ref 11.5–15.5)
WBC: 11.4 10*3/uL — ABNORMAL HIGH (ref 4.0–10.5)
nRBC: 0 % (ref 0.0–0.2)

## 2020-01-31 LAB — LIPID PANEL
Cholesterol: 81 mg/dL (ref 0–200)
HDL: 35 mg/dL — ABNORMAL LOW (ref >40)
LDL Cholesterol: 28 mg/dL (ref 0–99)
Total CHOL/HDL Ratio: 2.3 RATIO
Triglycerides: 90 mg/dL (ref <150)
VLDL: 18 mg/dL (ref 0–40)

## 2020-01-31 LAB — EXTRACTABLE NUCLEAR ANTIGEN ANTIBODY
ENA SM Ab Ser-aCnc: <0.2 AI (ref 0.0–0.9)
Ribonucleic Protein: <0.2 AI (ref 0.0–0.9)
SSA (Ro) (ENA) Antibody, IgG: <0.2 AI (ref 0.0–0.9)
SSB (La) (ENA) Antibody, IgG: <0.2 AI (ref 0.0–0.9)
Scleroderma (Scl-70) (ENA) Antibody, IgG: <0.2 AI (ref 0.0–0.9)
ds DNA Ab: <1 IU/mL (ref 0–9)

## 2020-01-31 LAB — LUPUS ANTICOAGULANT PANEL
DRVVT: 34.5 s (ref 0.0–47.0)
PTT Lupus Anticoagulant: 40.2 s (ref 0.0–51.9)

## 2020-01-31 LAB — ANTI-JO 1 ANTIBODY, IGG: Anti JO-1: <0.2 AI (ref 0.0–0.9)

## 2020-01-31 LAB — HEPARIN INDUCED PLATELET AB (HIT ANTIBODY): Heparin Induced Plt Ab: 0.093 OD (ref 0.000–0.400)

## 2020-01-31 LAB — HEPARIN LEVEL (UNFRACTIONATED)
Heparin Unfractionated: 0.36 IU/mL (ref 0.30–0.70)
Heparin Unfractionated: 0.32 IU/mL (ref 0.30–0.70)

## 2020-01-31 MED ORDER — ISOSORBIDE DINITRATE 10 MG PO TABS
5.0000 mg | ORAL_TABLET | Freq: Three times a day (TID) | ORAL | Status: DC
  Administered 2020-01-31 – 2020-02-01 (×5): 5 mg
  Filled 2020-01-31 (×4): qty 1

## 2020-01-31 MED ORDER — POTASSIUM CHLORIDE 10 MEQ/50ML IV SOLN
10.0000 meq | INTRAVENOUS | Status: AC
  Administered 2020-01-31 (×3): 10 meq via INTRAVENOUS
  Filled 2020-01-31 (×3): qty 50

## 2020-01-31 MED ORDER — SODIUM CHLORIDE 0.9 % IV SOLN
750.0000 mg | Freq: Two times a day (BID) | INTRAVENOUS | Status: AC
  Administered 2020-01-31 – 2020-02-04 (×10): 750 mg via INTRAVENOUS
  Filled 2020-01-31 (×10): qty 7.5

## 2020-01-31 MED ORDER — MIDAZOLAM HCL 2 MG/2ML IJ SOLN
1.0000 mg | INTRAMUSCULAR | Status: DC | PRN
  Administered 2020-01-31 – 2020-02-03 (×2): 2 mg via INTRAVENOUS
  Administered 2020-02-03 (×2): 1 mg via INTRAVENOUS
  Filled 2020-01-31 (×2): qty 2

## 2020-01-31 MED ORDER — MIDAZOLAM HCL 2 MG/2ML IJ SOLN: 2.0000 mg | Freq: Once | INTRAMUSCULAR | Status: AC

## 2020-01-31 MED ORDER — MIDAZOLAM HCL 2 MG/2ML IJ SOLN
INTRAMUSCULAR | Status: AC
  Administered 2020-01-31: 2 mg via INTRAVENOUS
  Filled 2020-01-31: qty 2

## 2020-01-31 NOTE — Progress Notes (Signed)
EEG ordered by CCM. EEG called twice with no response from department. Message left for them to come do pt EEG.

## 2020-01-31 NOTE — Progress Notes (Signed)
NAME:  [name] [name], MRN:  [id number], DOB:  1941-10-18, LOS: 5 ADMISSION DATE:  01/26/2020, CONSULTATION DATE:  01/30/2020 REFERRING MD:  Rory Percy CHIEF COMPLAINT:  Right hemiplegia   Brief History        This 79 year old was admitted on 3/8 for evaluation of chest pain.  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  She was up ambulating today and suddenly developed a right hemiplegia.  CTA showed an M1 occlusion and thrombectomy was performed.  An attempt was made to extubate the patient post procedure but this was not tolerated.  Of note at the time the CTA was performed she was also found to have pulmonary emboli.  History of present illness       History is as noted above with the following additions.  Intraoperative TEE showed essentially normal LV systolic dysfunction there was some LVH and a PFO with a left to right flow.  There is no thrombus noted in the left atrial appendage.  Past Medical History  Thyroid cancer, hypertension, hyperlipidemia.  Significant Hospital Events   Cardiac cath 3/12, CABG 3/13  Consults:  Thoracic surgery, neurology, interventional radiology.  Procedures:  Four-vessel CABG, left M1 thrombectomy  Significant Diagnostic Tests:    Micro Data:  None  Antimicrobials:  None   Interim history/subjective:  She remains intubated and mechanically ventilated this morning.  She is on no sedation at the time of my exam. Objective   Blood pressure 119/62, pulse 88, temperature 98.8 F (37.1 C), temperature source Oral, resp. rate (!) 0, height 5\' 3"  (1.6 m), weight 70.2 kg, SpO2 98 %.    Vent Mode: PSV;CPAP FiO2 (%):  [40 %-100 %] 40 % Set Rate:  [15 bmp] 15 bmp Vt Set:  [450 mL] 450 mL PEEP:  [5 cmH20] 5 cmH20 Pressure Support:  [10 cmH20] 10 cmH20 Plateau Pressure:  [16 cmH20-23 cmH20] 17 cmH20   Intake/Output Summary (Last 24 hours) at 01/31/2020 0852 Last data filed at 01/31/2020  0600 Gross per 24 hour  Intake 2006.62 ml  Output 2435 ml  Net -428.38 ml   Filed Weights   01/29/20 0500 01/30/20 0600 01/31/20 0500  Weight: 68.9 kg 70.8 kg 70.2 kg    Examination: General: She is orally intubated and mechanically ventilated.  She is on no sedation.    HENT: Unremarkable Lungs: Respirations are unlabored on a pressure support of 10.  There is symmetric air movement, no wheezes.  Chest tubes remain in place without significant drainage  Cardiovascular: S1 and S2 are regular without murmur rub or gallop.  Telemetry is still showing a 100% atrially paced rhythm.     Abdomen: The abdomen is soft without any organomegaly masses tenderness guarding or rebound.   Extremities: 1-2+ symmetric lower extremity edema  Neuro: Very lethargic despite being on no sedation.  She does not respond to voice.  She eye opens and lethargically and intermittently nods after being awakened with noxious stimuli.  Pupils are equal she has minimal movement of both upper extremities.   GU: Foley is in place  Resolved Hospital Problem list     Assessment & Plan:  1.  Status post thrombectomy of a left MCA occlusion.  Her mental status remains quite poor.  An EEG is in process to rule out subclinical seizures I am anticipating the need for repeat scan in light of her poor mental status and current anticoagulation.   2.  Pulmonary emboli.  As noted this is distinctly unusual in a post bypass patient.  Labs to determine whether or not she has a prothrombotic state are still pending.   3.  Respiratory failure.  She is actually easy to ventilate and oxygenate unfortunately her mental status precludes extubation.    Best practice:  Diet: N.p.o. Pain/Anxiety/Delirium protocol (if indicated):  VAP protocol (if indicated): Indicated but currently supine and flat due to angiography DVT prophylaxis: Fully anticoagulated  GI prophylaxis: Protonix ordered Glucose control:  Mobility: Bedrest  code  Status: Full Family Communication: Unfortunately appears that we do not have a surrogate for this patient Disposition:   Labs   CBC: Recent Labs  Lab 01/28/20 0915 01/28/20 1244 01/28/20 1829 01/28/20 1829 01/29/20 0354 01/30/20 0438 01/30/20 1200 01/31/20 0550 01/31/20 0559  WBC 9.7  --  12.3*  --  15.6* 13.0*  --  11.4*  --   NEUTROABS  --   --   --   --   --   --   --  9.2*  --   HGB 10.9*   < > 11.3*   < > 11.1* 10.2* 9.2* 9.6* 10.2*  HCT 32.2*   < > 33.2*   < > 33.6* 31.1* 27.0* 29.2* 30.0*  MCV 88.5  --  88.8  --  90.8 92.0  --  91.5  --   PLT 153  --  158  --  184 165  --  182  --    < > = values in this interval not displayed.    Basic Metabolic Panel: Recent Labs  Lab 01/28/20 0625 01/28/20 1244 01/28/20 1829 01/28/20 1829 01/29/20 0354 01/30/20 0438 01/30/20 1200 01/31/20 0550 01/31/20 0559  NA 142   < > 138   < > 138 137 138 140 139  K 3.4*   < > 3.8   < > 4.3 3.8 3.9 3.7 3.5  CL 110  --  110  --  109 101  --  106  --   CO2 24  --  21*  --  22 28  --  25  --   GLUCOSE 159*  --  124*  --  129* 112*  --  120*  --   BUN 12  --  13  --  14 19  --  14  --   CREATININE 0.80  --  0.89  --  1.00 1.19*  --  0.93  --   CALCIUM 8.8*  --  9.6  --  9.8 9.6  --  9.5  --   MG 2.9*  --  2.4  --   --   --   --   --   --    < > = values in this interval not displayed.   GFR: Estimated Creatinine Clearance: 48.3 mL/min (by C-G formula based on SCr of 0.93 mg/dL). Recent Labs  Lab 01/28/20 1829 01/29/20 0354 01/30/20 0438 01/31/20 0550  WBC 12.3* 15.6* 13.0* 11.4*    Liver Function Tests: No results for input(s): AST, ALT, ALKPHOS, BILITOT, PROT, ALBUMIN in the last 168 hours. No results for input(s): LIPASE, AMYLASE in the last 168 hours. No results for input(s): AMMONIA in the last 168 hours.  ABG    Component Value Date/Time   PHART 7.429 01/31/2020 0559   PCO2ART 41.4 01/31/2020 0559   PO2ART 78.0 (L) 01/31/2020 0559   HCO3 27.4 01/31/2020 0559    TCO2 29 01/31/2020 0559   ACIDBASEDEF 2.0  01/28/2020 1545   O2SAT 96.0 01/31/2020 0559     Coagulation Profile: Recent Labs  Lab 01/27/20 0524 01/27/20 2048 01/28/20 0625 01/30/20 0955  INR 1.0 1.5* 1.1 1.1    Cardiac Enzymes: No results for input(s): CKTOTAL, CKMB, CKMBINDEX, TROPONINI in the last 168 hours.  HbA1C: Hgb A1c MFr Bld  Date/Time Value Ref Range Status  01/27/2020 05:24 AM 5.1 4.8 - 5.6 % Final    Comment:    (NOTE) Pre diabetes:          5.7%-6.4% Diabetes:              >6.4% Glycemic control for   <7.0% adults with diabetes     CBG: Recent Labs  Lab 01/30/20 1555 01/30/20 2004 01/30/20 2351 01/31/20 0351 01/31/20 0834  GLUCAP 116* 118* 108* 120* 136*    Review of Systems:   Not obtainable  Past Medical History  She,  has a past medical history of Coronary artery disease, Family history of adverse reaction to anesthesia, HTN (hypertension), Hyperlipidemia, Hypothyroidism, Osteoporosis, and Thyroid carcinoma ([place]).   Surgical History    Past Surgical History:  Procedure Laterality Date  . ABDOMINAL HYSTERECTOMY    . ANKLE SURGERY    . APPENDECTOMY    . CARDIAC CATHETERIZATION  01/25/2010  . CHOLECYSTECTOMY    . gamma knife    . IR CT HEAD LTD  01/30/2020  . IR PERCUTANEOUS ART THROMBECTOMY/INFUSION INTRACRANIAL INC DIAG ANGIO  01/30/2020  . LEFT HEART CATH AND CORONARY ANGIOGRAPHY N/A 01/26/2020   Procedure: LEFT HEART CATH AND CORONARY ANGIOGRAPHY;  Surgeon: Belva Crome, MD;  Location: [facility];  Service: Cardiovascular;  Laterality: N/A;  . THYROIDECTOMY    . TOTAL HIP ARTHROPLASTY       Social History   reports that she has never smoked. She has never used smokeless tobacco. She reports previous alcohol use. She reports that she does not use drugs.   Family History   Her family history includes Breast cancer (age of onset: 11) in her mother; Cancer in her brother; Cirrhosis in her father; Diabetes in her mother; Heart  attack in her sister; Heart disease in her brother and mother; Hypertension in her mother.   Allergies Allergies  Allergen Reactions  . Bee Venom Swelling    Massive swelling  . Penicillins Other (See Comments)    UNSPECIFIED REACTION  Did it involve swelling of the face/tongue/throat, SOB, or low BP? Unknown Did it involve sudden or severe rash/hives, skin peeling, or any reaction on the inside of your mouth or nose? Unknown Did you need to seek medical attention at a hospital or doctor's office? Unknown When did it last happen?1945 If all above answers are "NO", may proceed with cephalosporin use.  . Adhesive [Tape] Rash  . Morphine And Related Rash  . Sulfa Antibiotics Nausea And Vomiting and Rash     Home Medications  Prior to Admission medications   Medication Sig Start Date End Date Taking? Authorizing Provider  acetaminophen (TYLENOL) 500 MG tablet Take 1,000 mg by mouth in the morning and at bedtime.    Yes [provider]  aspirin EC 81 MG tablet Take 81 mg by mouth at bedtime.   Yes [provider]  atorvastatin (LIPITOR) 80 MG tablet Take 80 mg by mouth daily at 12 noon.    Yes [provider]  cholecalciferol (VITAMIN D) 1000 UNITS tablet Take 1,000 Units by mouth daily at 12 noon.  Yes [provider]  fluticasone (FLONASE) 50 MCG/ACT nasal spray Place 2 sprays into both nostrils daily. 12/19/19  Yes [provider]  levothyroxine (SYNTHROID, LEVOTHROID) 200 MCG tablet Take 100-200 mcg by mouth See admin instructions. Take 200 mcg  SUN-TUES-THUR-SAT Take 100 mcg tab MON-WED-FRI  DO NOT USE GENERIC   Yes [provider]  lisinopril (ZESTRIL) 20 MG tablet Take 20 mg by mouth daily.    Yes [provider]  metoprolol tartrate (LOPRESSOR) 25 MG tablet Take 1 tablet (25 mg total) by mouth 2 (two) times daily. Patient taking differently: Take 25 mg by mouth 2 (two) times daily after a meal.  01/04/20 04/03/20  Yes [name], [name], MD  nitroGLYCERIN (NITROSTAT) 0.4 MG SL tablet Place 1 tablet (0.4 mg total) under the tongue every 5 (five) minutes as needed for chest pain. 11/22/19 02/20/20 Yes [name], [name], MD  Prenatal Vit-Fe Fumarate-FA (PRENATAL MULTIVITAMIN) TABS tablet Take 1 tablet by mouth daily at 12 noon.   Yes [provider]  vitamin B-12 (CYANOCOBALAMIN) 1000 MCG tablet Take 2,500 mcg by mouth daily.   Yes [provider]     Critical care time: Greater than 32 minutes was spent in the care of this acutely ill patient today    Lars Masson, MD Critical Care Medicine

## 2020-01-31 NOTE — Progress Notes (Addendum)
Referring Physician(s): Code Stroke- [name]  Supervising Physician: Pedro Earls  Patient Status:  [facility] - In-pt  Chief Complaint: None- intubated without sedation  Subjective:  History of acute CVA s/p cerebral arteriogram with emergent mechanical thrombectomy of left MCA M1 occlusion achieving a TICI 3 revascularization 01/30/2020 by Dr. Karenann Cai. Patient laying in bed intubated without sedation. She opens eyes to name and quickly shuts them. Does not follow simple commands. Can wiggle bilateral toes and move LUE but no movements of RUE. Right groin incision c/d/i.   Allergies: Bee venom, Penicillins, Adhesive [tape], Morphine and related, and Sulfa antibiotics  Medications: Prior to Admission medications   Medication Sig Start Date End Date Taking? Authorizing Provider  acetaminophen (TYLENOL) 500 MG tablet Take 1,000 mg by mouth in the morning and at bedtime.    Yes [provider]  aspirin EC 81 MG tablet Take 81 mg by mouth at bedtime.   Yes [provider]  atorvastatin (LIPITOR) 80 MG tablet Take 80 mg by mouth daily at 12 noon.    Yes [provider]  cholecalciferol (VITAMIN D) 1000 UNITS tablet Take 1,000 Units by mouth daily at 12 noon.    Yes [provider]  fluticasone (FLONASE) 50 MCG/ACT nasal spray Place 2 sprays into both nostrils daily. 12/19/19  Yes [provider]  levothyroxine (SYNTHROID, LEVOTHROID) 200 MCG tablet Take 100-200 mcg by mouth See admin instructions. Take 200 mcg  SUN-TUES-THUR-SAT Take 100 mcg tab MON-WED-FRI  DO NOT USE GENERIC   Yes [provider]  lisinopril (ZESTRIL) 20 MG tablet Take 20 mg by mouth daily.    Yes [provider]  metoprolol tartrate (LOPRESSOR) 25 MG tablet Take 1 tablet (25 mg total) by mouth 2 (two) times daily. Patient taking differently: Take 25 mg by mouth 2 (two) times daily after a meal.  01/04/20 04/03/20 Yes [name],  [name], MD  nitroGLYCERIN (NITROSTAT) 0.4 MG SL tablet Place 1 tablet (0.4 mg total) under the tongue every 5 (five) minutes as needed for chest pain. 11/22/19 02/20/20 Yes [name], [name], MD  Prenatal Vit-Fe Fumarate-FA (PRENATAL MULTIVITAMIN) TABS tablet Take 1 tablet by mouth daily at 12 noon.   Yes [provider]  vitamin B-12 (CYANOCOBALAMIN) 1000 MCG tablet Take 2,500 mcg by mouth daily.   Yes [provider]     Vital Signs: BP (!) 123/58   Pulse 88   Temp 98.8 F (37.1 C) (Oral)   Resp (!) 23   Ht '5\' 3"'$  (1.6 m)   Wt 154 lb 12.2 oz (70.2 kg)   SpO2 96%   BMI 27.42 kg/m   Physical Exam Vitals and nursing note reviewed.  Constitutional:      General: She is not in acute distress.    Comments: Intubated without sedation.  Pulmonary:     Effort: Pulmonary effort is normal. No respiratory distress.     Comments: Intubated without sedation. Skin:    General: Skin is warm and dry.     Comments: Right groin incision soft without active bleeding or hematoma.  Neurological:     Comments: Intubated without sedation. She opens eyes to name and quickly shuts them. Does not follow simple commands. PERRL bilaterally. Can wiggle bilateral toes and move LUE but no movements of RUE. Distal pulses 1+ bilaterally.  Psychiatric:     Comments: Intubated without sedation.     Imaging: CT Code Stroke CTA Head W/WO contrast  Result  Date: 01/30/2020 CLINICAL DATA:  Stroke symptoms EXAM: CT ANGIOGRAPHY HEAD AND NECK CT PERFUSION BRAIN TECHNIQUE: Multidetector CT imaging of the head and neck was performed using the standard protocol during bolus administration of intravenous contrast. Multiplanar CT image reconstructions and MIPs were obtained to evaluate the vascular anatomy. Carotid stenosis measurements (when applicable) are obtained utilizing NASCET criteria, using the distal internal carotid diameter as the denominator. Multiphase CT imaging of the brain was  performed following IV bolus contrast injection. Subsequent parametric perfusion maps were calculated using RAPID software. CONTRAST:  Dose is not available COMPARISON:  Head CT from earlier today FINDINGS: CTA NECK FINDINGS Aortic arch: Atherosclerosis.  Changes of recent cardiac surgery. Right carotid system: Atherosclerotic plaque primarily at the bifurcation with up to 40% stenosis Left carotid system: Advanced atheromatous narrowing at the ICA bulb, flow reducing and measuring 80% on sagittal reformats. No dissection. Vertebral arteries: No proximal subclavian stenosis. The right vertebral artery is dominant and diffusely patent. Proximal left vertebral occlusion with reconstitution and robust flow by the dura. 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 Other neck: No acute finding. Upper chest: Bilateral upper lobe subsegmental pulmonary emboli with central branching acute appearance. Review of the MIP images confirms the above findings CTA HEAD FINDINGS Anterior circulation: Left M1 occlusion. Atherosclerotic calcification of both carotid siphons with moderate narrowing at the left paraclinoid segment. Posterior circulation: The right vertebral artery is dominant. Diffuse atheromatous plaque seen along the left V4 segment with patency to the basilar. The basilar is smooth and diffusely patent. No PCA branch occlusion or flow limiting stenosis. Mild right P2 segment narrowing. Venous sinuses: Unremarkable in the arterial phase Anatomic variants: None significant Review of the MIP images confirms the above findings CT Brain Perfusion Findings: ASPECTS: 10 CBF (<30%) Volume: 16m Perfusion (Tmax>6.0s) volume: 1520mMismatch Volume: 85 cc of penumbramL Critical Value/emergent results were called by telephone at the time of interpretation on 01/30/2020 at 7:01 am to [facility] who verbally acknowledged these results. IMPRESSION: 1. Emergent large vessel occlusion at the left M1  segment. 2. Aspects of 10 with CT perfusion suggesting core infarct of 74 cc and 85 cc of penumbra. 3. Flow reducing left ICA bulb stenosis. 4. Bilateral subsegmental acute pulmonary emboli. 5. Proximal occlusion of the non dominant left vertebral artery with reconstitution at the dura. Electronically Signed   By: JoMonte Fantasia.D.   On: 01/30/2020 07:04   CT HEAD WO CONTRAST  Result Date: 01/31/2020 CLINICAL DATA:  Status post revascularization of left M1 occlusion. EXAM: CT HEAD WITHOUT CONTRAST TECHNIQUE: Contiguous axial images were obtained from the base of the skull through the vertex without intravenous contrast. COMPARISON:  CT head without contrast 01/30/2020 FINDINGS: Brain: Small nonhemorrhagic cortical infarct is present in the inferior left frontal gyrus on axial image 24 and coronal image 31. No other acute or focal cortical infarct is present. Precentral gyrus is intact. Mild white matter changes are again seen. The left lentiform nucleus is less well-defined than on the right. Insular cortex is intact. Caudate lobe is normal. Internal capsule is normal. No hemorrhage is present. The ventricles are of normal size. No significant extraaxial fluid collection is present. The brainstem and cerebellum are within normal limits. Vascular: Atherosclerotic calcifications are present within the cavernous internal carotid arteries and at the dural margin of both vertebral arteries. No hyperdense vessel is present. Skull: Calvarium is intact. No focal lytic or blastic lesions are present. No significant extracranial soft tissue lesion  is present. Sinuses/Orbits: The paranasal sinuses and mastoid air cells are clear. The globes and orbits are within normal limits. IMPRESSION: 1. No significant hemorrhage following revascularization of left M1 occlusion. 2. Small cortical infarct in the anterior left frontal lobe. 3. Question ischemic infarct of the left lentiform nucleus, less well seen than on the right.  4. No other significant cortical or basal ganglia infarct. Electronically Signed   By: San Morelle M.D.   On: 01/31/2020 07:20   CT Code Stroke CTA Neck W/WO contrast  Result Date: 01/30/2020 CLINICAL DATA:  Stroke symptoms EXAM: CT ANGIOGRAPHY HEAD AND NECK CT PERFUSION BRAIN TECHNIQUE: Multidetector CT imaging of the head and neck was performed using the standard protocol during bolus administration of intravenous contrast. Multiplanar CT image reconstructions and MIPs were obtained to evaluate the vascular anatomy. Carotid stenosis measurements (when applicable) are obtained utilizing NASCET criteria, using the distal internal carotid diameter as the denominator. Multiphase CT imaging of the brain was performed following IV bolus contrast injection. Subsequent parametric perfusion maps were calculated using RAPID software. CONTRAST:  Dose is not available COMPARISON:  Head CT from earlier today FINDINGS: CTA NECK FINDINGS Aortic arch: Atherosclerosis.  Changes of recent cardiac surgery. Right carotid system: Atherosclerotic plaque primarily at the bifurcation with up to 40% stenosis Left carotid system: Advanced atheromatous narrowing at the ICA bulb, flow reducing and measuring 80% on sagittal reformats. No dissection. Vertebral arteries: No proximal subclavian stenosis. The right vertebral artery is dominant and diffusely patent. Proximal left vertebral occlusion with reconstitution and robust flow by the dura. 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 Other neck: No acute finding. Upper chest: Bilateral upper lobe subsegmental pulmonary emboli with central branching acute appearance. Review of the MIP images confirms the above findings CTA HEAD FINDINGS Anterior circulation: Left M1 occlusion. Atherosclerotic calcification of both carotid siphons with moderate narrowing at the left paraclinoid segment. Posterior circulation: The right vertebral artery is dominant. Diffuse  atheromatous plaque seen along the left V4 segment with patency to the basilar. The basilar is smooth and diffusely patent. No PCA branch occlusion or flow limiting stenosis. Mild right P2 segment narrowing. Venous sinuses: Unremarkable in the arterial phase Anatomic variants: None significant Review of the MIP images confirms the above findings CT Brain Perfusion Findings: ASPECTS: 10 CBF (<30%) Volume: 50m Perfusion (Tmax>6.0s) volume: 1526mMismatch Volume: 85 cc of penumbramL Critical Value/emergent results were called by telephone at the time of interpretation on 01/30/2020 at 7:01 am to [facility] who verbally acknowledged these results. IMPRESSION: 1. Emergent large vessel occlusion at the left M1 segment. 2. Aspects of 10 with CT perfusion suggesting core infarct of 74 cc and 85 cc of penumbra. 3. Flow reducing left ICA bulb stenosis. 4. Bilateral subsegmental acute pulmonary emboli. 5. Proximal occlusion of the non dominant left vertebral artery with reconstitution at the dura. Electronically Signed   By: JoMonte Fantasia.D.   On: 01/30/2020 07:04   IR CT Head Ltd  Result Date: 01/30/2020 CLINICAL DATA:  [age]ear old female with past medical history of coronary artery disease, hypertension, hyperlipidemia and thyroid cancer who was admitted for three-vessel CABG performed 2 days ago. She developed the sudden right-sided weakness. Last known well was 6 a.m. today. Baseline mRS is unknown (likely 0-1). No IV tPA given due to recent surgery. Head CT showed no acute infarct or hemorrhage. CT angiogram showed a left ICA T occlusion extending to the MCA bifurcation with moderate to poor collaterals. CT  perfusion showing possible core infarct of 74 mL with a mismatch core/penumbra of 85 mL. She was brought to our service for an emergency mechanical thrombectomy. EXAM: IR PERCUTANEOUS ART THORMBECTOMY/INFUSION INTRACRANIAL INCLUDE DIAG ANGIO; IR CT HEAD LIMITED ANESTHESIA/SEDATION: General  MEDICATIONS: Intra-arterial verapamil 5 mg PROCEDURE: DIAGNOSTIC CEREBRAL ANGIOGRAM AND MECHANICAL THROMBECTOMY/ASPIRATION Operator: Dr. Pedro Earls, MD. Informed consent was obtained from patient's next of kin and attach to patient's chart. The patient was prepped and draped using usual sterile technique. Using a micropuncture kit and modified Seldinger technique, access was gained to the right common femoral artery and an 8 French sheath was placed. Under fluoroscopy, an 8 French walrus balloon guide catheter was navigated over a 6 [place] Berenstein II catheter and a 0.038" Terumo Glidewire into the aortic arch. Under fluoroscopy, the catheter was placed into the left common carotid artery. Frontal and lateral angiograms of the neck were obtained. Atherosclerotic disease is seen involving the distal left common carotid artery, carotid bifurcation and proximal cervical left ICA, more pronounced at the level of the bulb where a calcified plaque causes approximately 45% stenosis. A proximal left M1/MCA occlusion is identified with moderate collaterals from the left ACA to the left MCA territory. Using biplane roadmap, a catalyst 6 aspiration catheter was navigated over a phenom 21 microcatheter and a synchro support microguidewire into the petrous segment of the left ICA. Frontal and lateral angiograms of the head were obtained, confirming the proximal left M1 occlusion. The microcatheter was then navigated over the microwire into the left M2/MCA middle division branch. A 6 mm solitaire thrombectomy device was then deployed spanning the left M1 and left M2/MCA middle division branch. The microcatheter was removed under fluoroscopy. The thrombectomy device was allowed to intercalated with the clot for 4 minutes. The guiding catheter balloon was inflated in the upper cervical segment of the left ICA. The thrombectomy device and aspiration catheter were then removed under constant aspiration. Follow-up  angiogram showed recanalization of the left M1/MCA with slow flow in the middle division branch related to device induced vasospasm. Intra arterial infusion of 5 mg of verapamil was performed to the left ICA over 10 minutes. Follow-up angiogram showed near complete resolution of the vasospasm with complete restoration of flow (TICI 3). Postprocedure flat panel CT of the head was obtained. Images were post processed in a separate workstation under concurrent attending physician supervision. No evidence of hemorrhagic complication identified. Left common femoral artery angiogram in frontal and lateral views showed access at the level of the common femoral artery. The sheath was removed and the access site was closed with a Perclose ProGlide. Immediate hemostasis achieved. COMPLICATIONS: None immediate FINDINGS: 1. 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 2. 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 IMPRESSION: 1. Successful and uncomplicated mechanical thrombectomy and aspiration for treatment of a left M1/MCA proximal occlusion with complete restoration of flow (TICI 3) after 1 thrombectomy device pass. 2. No hemorrhagic complication or embolus to new territory. PLAN: Follow-up 1 month after discharge. Carotid duplex recommended prior to consultation. Electronically Signed   By: Pedro Earls M.D.   On: 01/30/2020 16:43   CT Code Stroke Cerebral Perfusion with contrast  Result Date: 01/30/2020 CLINICAL DATA:  Stroke symptoms EXAM: CT ANGIOGRAPHY HEAD AND NECK CT PERFUSION BRAIN TECHNIQUE: Multidetector CT imaging of the head and neck was performed using the standard protocol during bolus administration of intravenous contrast. Multiplanar CT image reconstructions and MIPs  were obtained to evaluate the vascular anatomy. Carotid stenosis measurements (when applicable) are obtained utilizing NASCET criteria, using the  distal internal carotid diameter as the denominator. Multiphase CT imaging of the brain was performed following IV bolus contrast injection. Subsequent parametric perfusion maps were calculated using RAPID software. CONTRAST:  Dose is not available COMPARISON:  Head CT from earlier today FINDINGS: CTA NECK FINDINGS Aortic arch: Atherosclerosis.  Changes of recent cardiac surgery. Right carotid system: Atherosclerotic plaque primarily at the bifurcation with up to 40% stenosis Left carotid system: Advanced atheromatous narrowing at the ICA bulb, flow reducing and measuring 80% on sagittal reformats. No dissection. Vertebral arteries: No proximal subclavian stenosis. The right vertebral artery is dominant and diffusely patent. Proximal left vertebral occlusion with reconstitution and robust flow by the dura. 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 Other neck: No acute finding. Upper chest: Bilateral upper lobe subsegmental pulmonary emboli with central branching acute appearance. Review of the MIP images confirms the above findings CTA HEAD FINDINGS Anterior circulation: Left M1 occlusion. Atherosclerotic calcification of both carotid siphons with moderate narrowing at the left paraclinoid segment. Posterior circulation: The right vertebral artery is dominant. Diffuse atheromatous plaque seen along the left V4 segment with patency to the basilar. The basilar is smooth and diffusely patent. No PCA branch occlusion or flow limiting stenosis. Mild right P2 segment narrowing. Venous sinuses: Unremarkable in the arterial phase Anatomic variants: None significant Review of the MIP images confirms the above findings CT Brain Perfusion Findings: ASPECTS: 10 CBF (<30%) Volume: 62m Perfusion (Tmax>6.0s) volume: 1554mMismatch Volume: 85 cc of penumbramL Critical Value/emergent results were called by telephone at the time of interpretation on 01/30/2020 at 7:01 am to provider [facility] who verbally  acknowledged these results. IMPRESSION: 1. Emergent large vessel occlusion at the left M1 segment. 2. Aspects of 10 with CT perfusion suggesting core infarct of 74 cc and 85 cc of penumbra. 3. Flow reducing left ICA bulb stenosis. 4. Bilateral subsegmental acute pulmonary emboli. 5. Proximal occlusion of the non dominant left vertebral artery with reconstitution at the dura. Electronically Signed   By: JoMonte Fantasia.D.   On: 01/30/2020 07:04   DG CHEST PORT 1 VIEW  Result Date: 01/30/2020 CLINICAL DATA:  Intubation of airway EXAM: PORTABLE CHEST 1 VIEW COMPARISON:  01/30/2020 FINDINGS: Endotracheal tube tip 2 cm above the carina. NGT placed with the tip in the stomach. Right jugular central venous catheter tips unchanged. Two catheters in place in the right SVC. Bilateral chest tubes in place. Mediastinal drain in place. No pneumothorax. Right lower lobe atelectasis unchanged. Right hilar surgical clips again noted. No significant effusion. IMPRESSION: Endotracheal tube placed with the tip 2 cm above the carina. Right lower lobe atelectasis unchanged. Bilateral chest tubes without pneumothorax. Electronically Signed   By: ChFranchot Gallo.D.   On: 01/30/2020 10:07   DG Chest Port 1 View  Result Date: 01/30/2020 CLINICAL DATA:  Chest tube, post open heart surgery EXAM: PORTABLE CHEST 1 VIEW COMPARISON:  Portable exam 0506 hours compared to 01/29/2020 FINDINGS: Mediastinal drain and BILATERAL thoracostomy tubes present. Two RIGHT jugular central venous catheters, tips projecting over SVC and cavoatrial junction. Epicardial pacing wires present. Enlargement of cardiac silhouette with pulmonary vascular congestion. Prominent mediastinum consistent with preceding surgery. Scattered atelectasis in the mid to lower lungs bilaterally with small RIGHT pleural effusion. Tiny LEFT apex pneumothorax, decreased. Bones demineralized with LEFT glenohumeral degenerative changes noted as well as degenerative changes of  the  thoracic spine. IMPRESSION: Bibasilar atelectasis and RIGHT pleural effusion. Tiny LEFT apex pneumothorax despite thoracostomy tube, decreased. Electronically Signed   By: Lavonia Dana M.D.   On: 01/30/2020 07:55   DG Chest Port 1 View  Result Date: 01/29/2020 CLINICAL DATA:  Open heart surgery EXAM: PORTABLE CHEST 1 VIEW COMPARISON:  01/28/2020 FINDINGS: Interval extubation and removal of enteric tube. Interval removal of right IJ Swan-Ganz catheter. Stable right IJ venous sheath and additional right IJ venous catheter terminating in the upper right atrium. Bilateral chest tubes and mediastinal drain. Tiny left apical pneumothorax, new. Mild subcutaneous emphysema along the right pectoralis, unchanged. Cardiomegaly.  Postsurgical changes related to prior CABG. IMPRESSION: Postsurgical changes related to prior CABG. Tiny left apical pneumothorax, new. Stable bilateral chest tubes and mediastinal drain. Interval removal of endotracheal tube, enteric tube, and right IJ Swan-Ganz catheter. Electronically Signed   By: Julian Hy M.D.   On: 01/29/2020 07:30   DG Chest Port 1 View  Result Date: 01/28/2020 CLINICAL DATA:  Status post CABG EXAM: PORTABLE CHEST 1 VIEW COMPARISON:  Age yesterday FINDINGS: Swan-Ganz catheter tip is at the main pulmonary artery level. Right IJ line in good position. Chest drains are present with no visible pneumothorax. Endotracheal tube tip between the clavicular heads and carina, slightly advanced from before. The orogastric tube reaches the stomach. Interstitial coarsening that is likely atelectasis. No visible effusion. IMPRESSION: 1. Unremarkable hardware positioning. 2. Patchy atelectasis that is also stable. Electronically Signed   By: Monte Fantasia M.D.   On: 01/28/2020 08:49   DG Chest Port 1 View  Result Date: 01/27/2020 CLINICAL DATA:  Status post coronary bypass grafting EXAM: PORTABLE CHEST 1 VIEW COMPARISON:  01/26/2011 FINDINGS: Cardiac shadow is within  normal limits. Endotracheal tube and Swan-Ganz catheter are seen. Right jugular central line is noted at the cavoatrial junction. Gastric catheter is noted with the tip at the gastroesophageal junction. This should be advanced several cm into the stomach. Mediastinal drain and bilateral thoracostomy catheters are seen. No pneumothorax is noted. Patchy airspace opacities are noted which may represent edema. Mild platelike atelectasis is noted in the right base. IMPRESSION: Postsurgical changes with tubes and lines as described above. Patchy opacities are noted which may be related to parenchymal edema. No other focal abnormality is noted. Electronically Signed   By: Inez Catalina M.D.   On: 01/27/2020 20:32   DG Abd Portable 1V  Result Date: 01/28/2020 CLINICAL DATA:  78 year old female status post NG placement. EXAM: PORTABLE ABDOMEN - 1 VIEW COMPARISON:  Chest radiograph dated 01/27/2020. FINDINGS: An enteric tube is noted with tip in the left upper abdomen likely in the gastric fundus. Additional support apparatus in similar position. IMPRESSION: Enteric tube with tip in the gastric fundus. Electronically Signed   By: Anner Crete M.D.   On: 01/28/2020 01:11   ECHOCARDIOGRAM COMPLETE  Result Date: 01/30/2020    ECHOCARDIOGRAM REPORT   Patient Name:   [name] [name] Date of Exam: 01/30/2020 Medical Rec #:  [id number]          Height:       63.0 in Accession #:    [id number]         Weight:       156.1 lb Date of Birth:  11/12/1941          BSA:          1.740 m Patient Age:    26 years  BP:           133/64 mmHg Patient Gender: F                  HR:           67 bpm. Exam Location:  Inpatient Procedure: 2D Echo Indications:    Stroke 434.91 / I163.9  History:        Patient has prior history of Echocardiogram examinations. CAD,                 Prior CABG; Risk Factors:Hypertension and Dyslipidemia. External                 pacemaker.  Sonographer:    Darlina Sicilian RDCS Referring Phys:  [id number] ASHISH ARORA IMPRESSIONS  1. Left ventricular ejection fraction, by estimation, is 30 to 35%. The left ventricle has moderately decreased function. The left ventricle demonstrates global hypokinesis. There is mild asymmetric left ventricular hypertrophy of the septal segment. Left ventricular diastolic parameters are consistent with Grade I diastolic dysfunction (impaired relaxation). Elevated left ventricular end-diastolic pressure.  2. Right ventricular systolic function is normal. The right ventricular size is normal. There is mildly elevated pulmonary artery systolic pressure.  3. Left atrial size was mildly dilated.  4. The mitral valve is normal in structure. Trivial mitral valve regurgitation. No evidence of mitral stenosis.  5. The aortic valve is normal in structure. Aortic valve regurgitation is not visualized. No aortic stenosis is present.  6. There is mild dilatation of the ascending aorta measuring 38 mm.  7. The inferior vena cava is normal in size with greater than 50% respiratory variability, suggesting right atrial pressure of 3 mmHg. FINDINGS  Left Ventricle: Left ventricular ejection fraction, by estimation, is 30 to 35%. The left ventricle has moderately decreased function. The left ventricle demonstrates global hypokinesis. The left ventricular internal cavity size was normal in size. There is mild asymmetric left ventricular hypertrophy of the septal segment. Left ventricular diastolic parameters are consistent with Grade I diastolic dysfunction (impaired relaxation). Elevated left ventricular end-diastolic pressure. Right Ventricle: The right ventricular size is normal. No increase in right ventricular wall thickness. Right ventricular systolic function is normal. There is mildly elevated pulmonary artery systolic pressure. The tricuspid regurgitant velocity is 2.40  m/s, and with an assumed right atrial pressure of 15 mmHg, the estimated right ventricular systolic pressure is 10.6  mmHg. Left Atrium: Left atrial size was mildly dilated. Right Atrium: Right atrial size was normal in size. Pericardium: There is no evidence of pericardial effusion. Mitral Valve: The mitral valve is normal in structure. Normal mobility of the mitral valve leaflets. Trivial mitral valve regurgitation. No evidence of mitral valve stenosis. Tricuspid Valve: The tricuspid valve is normal in structure. Tricuspid valve regurgitation is mild . No evidence of tricuspid stenosis. Aortic Valve: The aortic valve is normal in structure. Aortic valve regurgitation is not visualized. No aortic stenosis is present. Pulmonic Valve: The pulmonic valve was normal in structure. Pulmonic valve regurgitation is mild. No evidence of pulmonic stenosis. Aorta: The aortic root is normal in size and structure. There is mild dilatation of the ascending aorta measuring 38 mm. Venous: The inferior vena cava is normal in size with greater than 50% respiratory variability, suggesting right atrial pressure of 3 mmHg. IAS/Shunts: No atrial level shunt detected by color flow Doppler.  LEFT VENTRICLE PLAX 2D LVIDd:         3.60 cm     Diastology LVIDs:  2.40 cm     LV e' lateral:   4.91 cm/s LV PW:         1.00 cm     LV E/e' lateral: 14.3 LV IVS:        1.10 cm     LV e' medial:    3.59 cm/s                            LV E/e' medial:  19.6  LV Volumes (MOD) LV vol d, MOD A2C: 69.0 ml LV vol d, MOD A4C: 88.7 ml LV vol s, MOD A2C: 48.3 ml LV vol s, MOD A4C: 57.1 ml LV SV MOD A2C:     20.7 ml LV SV MOD A4C:     88.7 ml LV SV MOD BP:      26.0 ml LEFT ATRIUM             Index       RIGHT ATRIUM           Index LA diam:        3.40 cm 1.95 cm/m  RA Area:     11.10 cm LA Vol (A2C):   32.3 ml 18.56 ml/m RA Volume:   21.20 ml  12.18 ml/m LA Vol (A4C):   55.4 ml 31.83 ml/m LA Biplane Vol: 43.2 ml 24.82 ml/m  AORTIC VALVE LVOT Vmax:   71.10 cm/s LVOT Vmean:  47.400 cm/s LVOT VTI:    0.141 m  AORTA Ao Root diam: 2.60 cm Ao Asc diam:  3.80 cm  MITRAL VALVE                TRICUSPID VALVE MV Area (PHT): 3.53 cm     TR Peak grad:   23.0 mmHg MV Decel Time: 215 msec     TR Vmax:        240.00 cm/s MV E velocity: 70.30 cm/s MV A velocity: 104.00 cm/s  SHUNTS MV E/A ratio:  0.68         Systemic VTI: 0.14 m Skeet Latch MD Electronically signed by Skeet Latch MD Signature Date/Time: 01/30/2020/5:25:49 PM    Final    ECHO INTRAOPERATIVE TEE  Result Date: 01/28/2020  *INTRAOPERATIVE TRANSESOPHAGEAL REPORT *  Patient Name:   [name] [name] Date of Exam: 01/27/2020 Medical Rec #:  [id number]          Height:       63.0 in Accession #:    [id number]         Weight:       144.0 lb Date of Birth:  Aug 12, 1941          BSA:          1.68 m Patient Age:    76 years           BP:           142/56 mmHg Patient Gender: F                  HR:           50 bpm. Exam Location:  Anesthesiology Transesophogeal exam was perform intraoperatively during surgical procedure. Patient was closely monitored under general anesthesia during the entirety of examination. Indications:     Coronary artery disease Sonographer:     Dustin Flock RDCS Performing Phys: [id number] Z ATKINS Diagnosing Phys: Roberts Gaudy MD Complications: No known complications during this procedure. PRE-OP FINDINGS  Left Ventricle: The LV cavity was normal in size. There was normal LV systolic function with the ejection fraction estimated at 60-65%. There were no regional wall moton abnormalities. There was mild concentric LV hypertrophy. The PW and antero-septum each measured 1.10 at end-diastole at the mid-papillary level. On the post-bypass exam, there was normal LV systolic function with no regional wall motion abnormalities. Right Ventricle: The right ventricle has normal systolic function. The cavity was mild to moderately enlarged. There is no increase in right ventricular wall thickness. Left Atrium: Left atrial size was normal in size. The left atrial appendage is well visualized  and there is evidence of thrombus present. Right Atrium: Right atrial pressure is estimated at 10 mmHg. The right atrium was enlarged and measured 4.0 cm in the medial-lateral dimension. Interatrial Septum: There was a patent foramen ovale present at the supeior aspect of the fossa ovalis. There was continuous left to right flow. Pericardium: There is no evidence of pericardial effusion. Tricuspid Valve: The tricuspid annulus was moderately enlarged and measured 4.0 cm in the four chamber view. 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 Aortic Valve: The aortic valve is tricuspid There is mild calcification of the aortic valve. Aortic valve regurgitation was not visualized by color flow Doppler. The aortic valve leaflets were mildly thickened. There was no aortic insufficiency. The peak  transaortic velocity was 1.9 m/sec. The peak gradient was 15 mm hg and the mean gradient was 6 mm hg. Pulmonic Valve: The pulmonic valve was normal in structure, with normal. Pulmonic valve regurgitation is trivial by color flow Doppler. Aorta: The ascending aorta was normal in size without dilatation or effacement. There was mild intimal thickening without protruding atheromatous disease. +--------------+--------++ LEFT VENTRICLE         +--------------+--------++ PLAX 2D                +--------------+--------++ LVOT diam:    1.90 cm  +--------------+--------++ LVOT Area:    2.84 cm +--------------+--------++                        +--------------+--------++ +------------------+------------++ AORTIC VALVE                   +------------------+------------++ AV Area (Vmax):   1.60 cm     +------------------+------------++ AV Area (Vmean):  2.11 cm     +------------------+------------++ AV Area (VTI):    1.29 cm     +------------------+------------++ AV Vmax:          191.00 cm/s  +------------------+------------++ AV Vmean:         106.000 cm/s  +------------------+------------++ AV VTI:           0.495 m      +------------------+------------++ AV Peak Grad:     14.6 mmHg    +------------------+------------++ AV Mean Grad:     6.0 mmHg     +------------------+------------++ LVOT Vmax:        108.00 cm/s  +------------------+------------++ LVOT Vmean:       78.950 cm/s  +------------------+------------++ LVOT VTI:         0.226 m      +------------------+------------++ LVOT/AV VTI ratio:0.46         +------------------+------------++  +--------------+-------+ SHUNTS                +--------------+-------+ Systemic VTI: 0.23 m  +--------------+-------+ Systemic Diam:1.90 cm +--------------+-------+  Roberts Gaudy MD Electronically signed by Roberts Gaudy MD Signature Date/Time: 01/28/2020/11:34:05 AM  Final    IR PERCUTANEOUS ART THROMBECTOMY/INFUSION INTRACRANIAL INC DIAG ANGIO  Result Date: 01/30/2020 CLINICAL DATA:  79 year old female with past medical history of coronary artery disease, hypertension, hyperlipidemia and thyroid cancer who was admitted for three-vessel CABG performed 2 days ago. She developed the sudden right-sided weakness. Last known well was 6 a.m. today. Baseline mRS is unknown (likely 0-1). No IV tPA given due to recent surgery. Head CT showed no acute infarct or hemorrhage. CT angiogram showed a left ICA T occlusion extending to the MCA bifurcation with moderate to poor collaterals. CT perfusion showing possible core infarct of 74 mL with a mismatch core/penumbra of 85 mL. She was brought to our service for an emergency mechanical thrombectomy. EXAM: IR PERCUTANEOUS ART THORMBECTOMY/INFUSION INTRACRANIAL INCLUDE DIAG ANGIO; IR CT HEAD LIMITED ANESTHESIA/SEDATION: General MEDICATIONS: Intra-arterial verapamil 5 mg PROCEDURE: DIAGNOSTIC CEREBRAL ANGIOGRAM AND MECHANICAL THROMBECTOMY/ASPIRATION Operator: Dr. Pedro Earls, MD. Informed consent was obtained from patient's next of kin  and attach to patient's chart. The patient was prepped and draped using usual sterile technique. Using a micropuncture kit and modified Seldinger technique, access was gained to the right common femoral artery and an 8 French sheath was placed. Under fluoroscopy, an 8 French walrus balloon guide catheter was navigated over a 6 [place] Berenstein II catheter and a 0.038" Terumo Glidewire into the aortic arch. Under fluoroscopy, the catheter was placed into the left common carotid artery. Frontal and lateral angiograms of the neck were obtained. Atherosclerotic disease is seen involving the distal left common carotid artery, carotid bifurcation and proximal cervical left ICA, more pronounced at the level of the bulb where a calcified plaque causes approximately 45% stenosis. A proximal left M1/MCA occlusion is identified with moderate collaterals from the left ACA to the left MCA territory. Using biplane roadmap, a catalyst 6 aspiration catheter was navigated over a phenom 21 microcatheter and a synchro support microguidewire into the petrous segment of the left ICA. Frontal and lateral angiograms of the head were obtained, confirming the proximal left M1 occlusion. The microcatheter was then navigated over the microwire into the left M2/MCA middle division branch. A 6 mm solitaire thrombectomy device was then deployed spanning the left M1 and left M2/MCA middle division branch. The microcatheter was removed under fluoroscopy. The thrombectomy device was allowed to intercalated with the clot for 4 minutes. The guiding catheter balloon was inflated in the upper cervical segment of the left ICA. The thrombectomy device and aspiration catheter were then removed under constant aspiration. Follow-up angiogram showed recanalization of the left M1/MCA with slow flow in the middle division branch related to device induced vasospasm. Intra arterial infusion of 5 mg of verapamil was performed to the left ICA over 10 minutes.  Follow-up angiogram showed near complete resolution of the vasospasm with complete restoration of flow (TICI 3). Postprocedure flat panel CT of the head was obtained. Images were post processed in a separate workstation under concurrent attending physician supervision. No evidence of hemorrhagic complication identified. Left common femoral artery angiogram in frontal and lateral views showed access at the level of the common femoral artery. The sheath was removed and the access site was closed with a Perclose ProGlide. Immediate hemostasis achieved. COMPLICATIONS: None immediate FINDINGS: 1. 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 2. 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 IMPRESSION: 1. Successful and uncomplicated mechanical thrombectomy and aspiration for treatment of a left M1/MCA proximal occlusion with complete restoration of flow (  TICI 3) after 1 thrombectomy device pass. 2. No hemorrhagic complication or embolus to new territory. PLAN: Follow-up 1 month after discharge. Carotid duplex recommended prior to consultation. Electronically Signed   By: Pedro Earls M.D.   On: 01/30/2020 16:43   CT HEAD CODE STROKE WO CONTRAST  Result Date: 01/30/2020 CLINICAL DATA:  Code stroke. Ataxia. Less responsive. Left-sided gaze. EXAM: CT HEAD WITHOUT CONTRAST TECHNIQUE: Contiguous axial images were obtained from the base of the skull through the vertex without intravenous contrast. COMPARISON:  Brain MRI 09/06/2018 FINDINGS: Brain: No evidence of acute infarction, hemorrhage, hydrocephalus, extra-axial collection or mass lesion/mass effect. Vascular: Hyperdense left M1 segment Skull: Negative Sinuses/Orbits: Negative Other: These results were communicated to Dr. Rory Percy at 6:35 amon 3/15/2021by text page via the [facility] messaging system. ASPECTS [facility] Stroke Program Early CT Score) - Ganglionic level infarction (caudate,  lentiform nuclei, internal capsule, insula, M1-M3 cortex): 7 - Supraganglionic infarction (M4-M6 cortex): 3 Total score (0-10 with 10 being normal): 10 IMPRESSION: 1. Hyperdense left M1 segment.  CTA and perfusion are in progress. 2. ASPECTS is 10. Electronically Signed   By: Monte Fantasia M.D.   On: 01/30/2020 06:38   VAS [place] LOWER EXTREMITY VENOUS (DVT)  Result Date: 01/30/2020  Lower Venous DVTStudy Indications: Pulmonary embolism.  Risk Factors: Confirmed PE. Limitations: Poor ultrasound/tissue interface and patient positioning, patient immobility. Comparison Study: No prior studies. Performing Technologist: Oliver Hum RVT  Examination Guidelines: A complete evaluation includes B-mode imaging, spectral Doppler, color Doppler, and power Doppler as needed of all accessible portions of each vessel. Bilateral testing is considered an integral part of a complete examination. Limited examinations for reoccurring indications may be performed as noted. The reflux portion of the exam is performed with the patient in reverse Trendelenburg.  +---------+---------------+---------+-----------+----------+--------------+ RIGHT    CompressibilityPhasicitySpontaneityPropertiesThrombus Aging +---------+---------------+---------+-----------+----------+--------------+ CFV      Full           Yes      Yes                                 +---------+---------------+---------+-----------+----------+--------------+ SFJ      Full                                                        +---------+---------------+---------+-----------+----------+--------------+ FV Prox  Full                                                        +---------+---------------+---------+-----------+----------+--------------+ FV Mid   Full                                                        +---------+---------------+---------+-----------+----------+--------------+ FV DistalFull                                                         +---------+---------------+---------+-----------+----------+--------------+  PFV      Full                                                        +---------+---------------+---------+-----------+----------+--------------+ POP      Full           Yes      Yes                                 +---------+---------------+---------+-----------+----------+--------------+ PTV      Full                                                        +---------+---------------+---------+-----------+----------+--------------+ PERO     Full                                                        +---------+---------------+---------+-----------+----------+--------------+   +---------+---------------+---------+-----------+----------+--------------+ LEFT     CompressibilityPhasicitySpontaneityPropertiesThrombus Aging +---------+---------------+---------+-----------+----------+--------------+ CFV      Full           Yes      Yes                                 +---------+---------------+---------+-----------+----------+--------------+ SFJ      Full                                                        +---------+---------------+---------+-----------+----------+--------------+ FV Prox  Full                                                        +---------+---------------+---------+-----------+----------+--------------+ FV Mid   Full                                                        +---------+---------------+---------+-----------+----------+--------------+ FV DistalFull                                                        +---------+---------------+---------+-----------+----------+--------------+ PFV      Full                                                        +---------+---------------+---------+-----------+----------+--------------+  POP      Full           Yes      Yes                                  +---------+---------------+---------+-----------+----------+--------------+ PTV      Partial                                      Acute          +---------+---------------+---------+-----------+----------+--------------+ PERO     Full                                                        +---------+---------------+---------+-----------+----------+--------------+     Summary: RIGHT: - There is no evidence of deep vein thrombosis in the lower extremity.  - No cystic structure found in the popliteal fossa.  LEFT: - Findings consistent with acute deep vein thrombosis involving the left posterior tibial veins. - No cystic structure found in the popliteal fossa.  *See table(s) above for measurements and observations. Electronically signed by Ruta Hinds MD on 01/30/2020 at 7:04:03 PM.    Final     Labs:  CBC: Recent Labs    01/28/20 1829 01/28/20 1829 01/29/20 0354 01/29/20 0354 01/30/20 0438 01/30/20 1200 01/31/20 0550 01/31/20 0559  WBC 12.3*  --  15.6*  --  13.0*  --  11.4*  --   HGB 11.3*   < > 11.1*   < > 10.2* 9.2* 9.6* 10.2*  HCT 33.2*   < > 33.6*   < > 31.1* 27.0* 29.2* 30.0*  PLT 158  --  184  --  165  --  182  --    < > = values in this interval not displayed.    COAGS: Recent Labs    01/27/20 0524 01/27/20 2048 01/28/20 0625 01/30/20 0955  INR 1.0 1.5* 1.1 1.1  APTT 76* 33 30  --     BMP: Recent Labs    01/28/20 1829 01/28/20 1829 01/29/20 0354 01/29/20 0354 01/30/20 0438 01/30/20 1200 01/31/20 0550 01/31/20 0559  NA 138   < > 138   < > 137 138 140 139  K 3.8   < > 4.3   < > 3.8 3.9 3.7 3.5  CL 110  --  109  --  101  --  106  --   CO2 21*  --  22  --  28  --  25  --   GLUCOSE 124*  --  129*  --  112*  --  120*  --   BUN 13  --  14  --  19  --  14  --   CALCIUM 9.6  --  9.8  --  9.6  --  9.5  --   CREATININE 0.89  --  1.00  --  1.19*  --  0.93  --   GFRNONAA >60  --  NOT CALCULATED  --  44*  --  60*  --   GFRAA >60  --  NOT CALCULATED  --   51*  --  >60  --    < > = values  in this interval not displayed.     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 Karenann Cai. Patient's condition stable- intubated without sedation, opens eyes to name only, can wiggle bilateral toes and move LUE but no movements of RUE. Right groin incision stable, distal pulses 1+ bilaterally. Further plans per TCTS/neurology/CCM- appreciate and agree with management. NIR to follow.   Electronically Signed: Earley Abide, PA-C 01/31/2020, 9:15 AM   I spent a total of 25 Minutes at the the patient's bedside AND on the patient's hospital floor or unit, greater than 50% of which was counseling/coordinating care for left MCA M1 occlusion s/p revascularization.

## 2020-01-31 NOTE — Progress Notes (Signed)
SLP Cancellation Note  Patient Details Name: [name] [name] MRN: [id number] DOB: 1941-08-19   Cancelled treatment:       Reason Eval/Treat Not Completed: Patient not medically ready. Will follow for readiness.    Oprah Camarena, Katherene Ponto 01/31/2020, 7:53 AM

## 2020-01-31 NOTE — Progress Notes (Signed)
PT Cancellation Note  Patient Details Name: KENZLEI KEMNITZ MRN: [id number] DOB: [birth year]   Cancelled Treatment:    Reason Eval/Treat Not Completed: Medical issues which prohibited therapy;Patient not medically ready;Patient's level of consciousness Pt continues to be intubated. Decreased arousal and only responding to noxious stimulus. Not following commands. Not ready for PT per RN. Will follow.   Marguarite Arbour A Valon Glasscock 01/31/2020, 9:00 AM Marisa Severin, PT, DPT Acute Rehabilitation Services Pager [phone] Office [phone]

## 2020-01-31 NOTE — Progress Notes (Signed)
Pt transported to ct and back with no complications. 

## 2020-01-31 NOTE — Progress Notes (Signed)
STROKE TEAM PROGRESS NOTE   INTERVAL HISTORY RN at bedside.  Patient still intubated, lethargic, only open eyes with pain stimulation.  Still have some right-sided weakness, but able to move with pain stimulation.  CT repeat showed likely small left frontal and basal ganglia infarct.  Reported overnight right arm and leg tremor versus seizure-like activity.  EEG done today showed left frontotemporal epileptogenicity.  Patient has been put on Keppra.  Vitals:   01/31/20 1400 01/31/20 1500 01/31/20 1524 01/31/20 1551  BP: 121/62 (!) 119/57    Pulse: 88 88    Resp: 17 17    Temp:    98.7 F (37.1 C)  TempSrc:    Oral  SpO2: 97% 98% 98%   Weight:      Height:        CBC:  Recent Labs  Lab 01/30/20 0438 01/30/20 1200 01/31/20 0550 01/31/20 0559  WBC 13.0*  --  11.4*  --   NEUTROABS  --   --  9.2*  --   HGB 10.2*   < > 9.6* 10.2*  HCT 31.1*   < > 29.2* 30.0*  MCV 92.0  --  91.5  --   PLT 165  --  182  --    < > = values in this interval not displayed.    Basic Metabolic Panel:  Recent Labs  Lab 01/28/20 0625 01/28/20 1244 01/28/20 1829 01/29/20 0354 01/30/20 0438 01/30/20 1200 01/31/20 0550 01/31/20 0559  NA 142   < > 138   < > 137   < > 140 139  K 3.4*   < > 3.8   < > 3.8   < > 3.7 3.5  CL 110  --  110   < > 101  --  106  --   CO2 24  --  21*   < > 28  --  25  --   GLUCOSE 159*  --  124*   < > 112*  --  120*  --   BUN 12  --  13   < > 19  --  14  --   CREATININE 0.80  --  0.89   < > 1.19*  --  0.93  --   CALCIUM 8.8*  --  9.6   < > 9.6  --  9.5  --   MG 2.9*  --  2.4  --   --   --   --   --    < > = values in this interval not displayed.   Lipid Panel:     Component Value Date/Time   CHOL 81 01/31/2020 0550   TRIG 90 01/31/2020 0550   HDL 35 (L) 01/31/2020 0550   CHOLHDL 2.3 01/31/2020 0550   VLDL 18 01/31/2020 0550   LDLCALC 28 01/31/2020 0550   HgbA1c:  Lab Results  Component Value Date   HGBA1C 5.1 01/27/2020   Urine Drug Screen: No results found  for: LABOPIA, COCAINSCRNUR, LABBENZ, AMPHETMU, THCU, LABBARB  Alcohol Level No results found for: ETH  IMAGING past 24 hours CT HEAD WO CONTRAST  Result Date: 01/31/2020 CLINICAL DATA:  Post left M1 thrombectomy EXAM: CT HEAD WITHOUT CONTRAST TECHNIQUE: Contiguous axial images were obtained from the base of the skull through the vertex without intravenous contrast. COMPARISON:  Earlier same day FINDINGS: Brain: There is diminished visualization the left lentiform nucleus. Stable potential small area of cortical hypoattenuation in the left frontal lobe, which could reflect volume averaging. There  is no acute intracranial hemorrhage or mass effect. Ventricles are stable in size. Vascular: No new findings. Skull: Calvarium is unremarkable. Sinuses/Orbits: No acute finding. Other: None. IMPRESSION: No acute intracranial hemorrhage. Likely evolving recent infarction of the lentiform nucleus. Electronically Signed   By: Macy Mis M.D.   On: 01/31/2020 12:06   CT HEAD WO CONTRAST  Result Date: 01/31/2020 CLINICAL DATA:  Status post revascularization of left M1 occlusion. EXAM: CT HEAD WITHOUT CONTRAST TECHNIQUE: Contiguous axial images were obtained from the base of the skull through the vertex without intravenous contrast. COMPARISON:  CT head without contrast 01/30/2020 FINDINGS: Brain: Small nonhemorrhagic cortical infarct is present in the inferior left frontal gyrus on axial image 24 and coronal image 31. No other acute or focal cortical infarct is present. Precentral gyrus is intact. Mild white matter changes are again seen. The left lentiform nucleus is less well-defined than on the right. Insular cortex is intact. Caudate lobe is normal. Internal capsule is normal. No hemorrhage is present. The ventricles are of normal size. No significant extraaxial fluid collection is present. The brainstem and cerebellum are within normal limits. Vascular: Atherosclerotic calcifications are present within the  cavernous internal carotid arteries and at the dural margin of both vertebral arteries. No hyperdense vessel is present. Skull: Calvarium is intact. No focal lytic or blastic lesions are present. No significant extracranial soft tissue lesion is present. Sinuses/Orbits: The paranasal sinuses and mastoid air cells are clear. The globes and orbits are within normal limits. IMPRESSION: 1. No significant hemorrhage following revascularization of left M1 occlusion. 2. Small cortical infarct in the anterior left frontal lobe. 3. Question ischemic infarct of the left lentiform nucleus, less well seen than on the right. 4. No other significant cortical or basal ganglia infarct. Electronically Signed   By: San Morelle M.D.   On: 01/31/2020 07:20   EEG adult  Result Date: 01/31/2020 Lora Havens, MD     01/31/2020  9:42 AM Patient Name: [name] [name] MRN: [id number] Epilepsy Attending: Lora Havens Referring Physician/Provider: Dr Rosalin Hawking Date: 01/31/2020 Duration: 24.48 mins Patient history: 79yo F with left MCA infarct continues to be lethargic. EEG to evaluate for seizure. Level of alertness: lethargic AEDs during EEG study: LEV Technical aspects: This EEG study was done with scalp electrodes positioned according to the 10-20 International system of electrode placement. Electrical activity was acquired at a sampling rate of 500Hz  and reviewed with a high frequency filter of 70Hz  and a low frequency filter of 1Hz . EEG data were recorded continuously and digitally stored. DESCRIPTION: EEG showed continuous generalized and lateralized left hemisphere 3-6hz  theta-delta slowing. Sharp waves were seen in left frontotemporal region, maximal T7. Hyperventilation and photic stimulation were not performed. ABNORMALITY - Continuous slow, generalized and lateralized left - Sharp waves, left frontotemporal region, maximal T7 IMPRESSION: This study showed evidence of epileptogenicity arising from left  frontotemporal as well as cortical dysfunction in left hemisphere likely secondary to underlying stroke. No seizures were seen throughout the recording. Priyanka Barbra Sarks    PHYSICAL EXAM   Temp:  [97.6 F (36.4 C)-99.2 F (37.3 C)] 98.7 F (37.1 C) (03/16 1551) Pulse Rate:  [71-89] 88 (03/16 1500) Resp:  [0-23] 17 (03/16 1500) BP: (111-137)/(36-63) 119/57 (03/16 1500) SpO2:  [96 %-100 %] 98 % (03/16 1524) Arterial Line BP: (126-160)/(36-56) 145/46 (03/16 1500) FiO2 (%):  [40 %-60 %] 40 % (03/16 1524) Weight:  [70.2 kg] 70.2 kg (03/16 0500)  General - Well nourished, well  developed, intubated off sedation.  Ophthalmologic - fundi not visualized due to noncooperation.  Cardiovascular - Regular rate and rhythm.  Neuro - intubated off sedation, eyes closed, able to open with pain stimulation but not following commands. With eye opening, eyes in mid position. More consistenly blinking to visual threat on the left but inconsistent on the right, doll's eyes present, not tracking, PERRL. Corneal reflex present, gag and cough present. Breathing over the vent.  Facial symmetry not able to test due to ET tube.  Tongue protrusion not cooperative. On pain stimulation, moving left UE and LE against gravity. However, RUE proximal 2/5 and distal 3/5 on pain, RLE 2+/5 on pain. DTR 1+ and bilateral positive babinski. Sensation, coordination and gait not tested.   ASSESSMENT/PLAN [name] [name] is a 79 y.o. female with history of coronary artery disease, hypertension, hyperlipidemia, thyroid cancer and possible bilateral carotid stenosis, admitted for treatment of symptomatic coronary artery disease and is 2 days postop from her three-vessel CABG, who while up walking with nurse became weak and developed L gaze, R sided weakness and aphasia. CT with dense L MCA. CTA w/ L M1 occlusion. Taken to IR.   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 secondary to large vessel disease source  Acute left gaze, right side weakness  Code Stroke CT head dense L MCA. ASPECTS 10.     CTA head & neck L M1 occlusion. Proximal L VA occlusion. L ICA bulb stenosis. B subsegmental PE.  CT perfusion 74cc core, 85 penumbra  Cerebral angio/IR proximal L M1 occlusion w/p mechanical thrombectomy w/TICI3 recanalization.  Post IR CT no hemorrhage  Not able to perform MRI due to external pacing wire  CT head 3/16 am no hemorrhage. Small anterior L frontal lobe infarct. ? L lentiform nucleus infarct.  CT head 3/16 noon no ICH. L lentiform nucleus evolving infarct   LE Doppler  L posterior tib DVT   2D Echo EF 30-35%. No source of embolus   TEE (intraop 3/12) + PFO  LDL 28  HgbA1c 5.1  Heparin IV for VTE prophylaxis  aspirin 81 mg daily prior to admission, now on aspirin 81 mg daily and clopidogrel 75 mg daily. On IV heparin per stroke protocol given PE and embolic stroke. Discussed with Dr. 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 DVT from inactivity  HIT screen, lupus ac panel, DIC panel pending   Neuro cleared for IV heparin  LE venous doppler L posterior tib DVT   PFO  TEE showed PFO present at the supeior aspect of the fossa ovalis with continuous left to right flow.   LE venous doppler L posterior tib DVT   Concerning for paradoxical emboli  Acute Respiratory Failure Intubated for IR; unable to tolerate extubation post procedure  B CT post op  Monitoring for extubation  CCM on board  Seizure-like activity  Jerking/tremoring movements R arm and shoulder 01/31/2020  Resolved w/ ativan  Put on Keppra 750 q 12h  EEG L frontotemporal sharp waves at T7, slow on L - d/t stroke  Continue Keppra  Coronary artery disease  s/p 3v CABG 3/13 Volume Overload Acute Blood Loss Anemia  External pacer   Lasix  Hgb 11.1->10.2  Management  per cardiology  On ASA and heparin IV now  Carotid stenosis  Chronic as per hx  CTA neck showed left ICA bulb tandem high grade stenosis and right ICA atherosclerosis.   outpt follow up with [facility]  Hypertension  Stable so far . Gradually normalize BP goal to normotensive . Long-term BP goal normotensive  Hyperlipidemia  Home meds:  lipitor 80, resumed in hospital  LDL 28, goal < 70  Continue statin at discharge  Dysphagia . NPO for now . Has OG tube . SLP following   Other Stroke Risk Factors  Advanced age  Overweight, Body mass index is 27.42 kg/m., recommend weight loss, diet and exercise as appropriate   CHF on isosorbid   Other Active Problems  AKI s/p dye load. 0.89->1.0->1.19 Monitor Cre  Hx Thyroid cancer  Hypothyroidism on levothryroxine  Hospital day # 5  This patient is critically ill due to acute left MCA infarct, left M1 occlusion s/p EVT, CAD s/p CABG, PFO, PE, intubated, seizure-like activity and at significant risk of neurological worsening, death form heart failure, respiratory failure, recrurent stroke, hemorrhagic conversion, status epilepticus. This patient's care requires constant monitoring of vital signs, hemodynamics, respiratory and cardiac monitoring, review of multiple databases, neurological assessment, discussion with family, other specialists and medical decision making of high complexity. I spent 30 minutes of neurocritical care time in the care of this patient.   Rosalin Hawking, MD PhD Stroke Neurology 01/31/2020 4:06 PM   To contact Stroke Continuity provider, please refer to [email or web address] After hours, contact General Neurology

## 2020-01-31 NOTE — Progress Notes (Signed)
      [address]       [place] [address]             [phone]      POD # 4 CABG  Intubated. Some spontaneous movement  BP (!) 129/55   Pulse 92   Temp 98.7 F (37.1 C) (Oral)   Resp 20   Ht 5\' 3"  (1.6 m)   Wt 70.2 kg   SpO2 97%   BMI 27.42 kg/m  PRVC 15, 40% 5 PEEP, did do some time on PS this AM  Om nicardipine at 5 mg/hr  Intake/Output Summary (Last 24 hours) at 01/31/2020 1813 Last data filed at 01/31/2020 1800 Gross per 24 hour  Intake 1854.56 ml  Output 1905 ml  Net -50.44 ml   CVA - Neuro following, PT/OT and Speech involved Atrial paced at [place]. Roxan Hockey, MD [facility] [phone]

## 2020-01-31 NOTE — Progress Notes (Signed)
[facility] called and notified of pt ABG taken this am. No new orders. Will continue to monitor.

## 2020-01-31 NOTE — Progress Notes (Signed)
RT note- transported to and from CT, remains on current settings.

## 2020-01-31 NOTE — Progress Notes (Signed)
OT Cancellation Note  Patient Details Name: [name] [name] MRN: [id number] DOB: 09-15-1941   Cancelled Treatment:    Reason Eval/Treat Not Completed: Medical issues which prohibited therapy;Other (comment) Patient's level of consciousness.  Pt continues to be intubated. Decreased arousal and only responding to noxious stimulus. Not following commands. Not ready for OT per RN. Will follow.  Jefferey Pica, OTR/L Acute Rehabilitation Services Pager: [phone] Office: [place] 01/31/2020, 1:28 PM

## 2020-01-31 NOTE — Procedures (Signed)
Patient Name: [name] [name]  MRN: [id number]  Epilepsy Attending: Lora Havens  Referring Physician/Provider: Dr Rosalin Hawking Date: 01/31/2020  Duration: 24.48 mins  Patient history: 79yo F with left MCA infarct continues to be lethargic. EEG to evaluate for seizure.  Level of alertness: lethargic  AEDs during EEG study: LEV  Technical aspects: This EEG study was done with scalp electrodes positioned according to the 10-20 International system of electrode placement. Electrical activity was acquired at a sampling rate of 500Hz  and reviewed with a high frequency filter of 70Hz  and a low frequency filter of 1Hz . EEG data were recorded continuously and digitally stored.   DESCRIPTION: EEG showed continuous generalized and lateralized left hemisphere 3-6hz  theta-delta slowing. Sharp waves were seen in left frontotemporal region, maximal T7. Hyperventilation and photic stimulation were not performed.  ABNORMALITY - Continuous slow, generalized and lateralized left  - Sharp waves, left frontotemporal region, maximal T7  IMPRESSION: This study showed evidence of epileptogenicity arising from left frontotemporal as well as cortical dysfunction in left hemisphere likely secondary to underlying stroke. No seizures were seen throughout the recording.      [name] [name]

## 2020-01-31 NOTE — Progress Notes (Signed)
eLink Physician-Brief Progress Note Patient Name: [name] [name] DOB: 11/27/1940 MRN: [id number]   Date of Service  01/31/2020  HPI/Events of Note  Pt with large ischemic CVA due to left M1 occlusion, now with jerking movements of her right arm and shoulder suspicious for seizures, interrupted by Versed 2 mg iv x 1.  eICU Interventions  Continuous EEG ordered, Keppra 750 mg iv Q 12 hours, Versed PRN seizure-like activity, Neurology consultation in a.m, MRI brain.        Kerry Kass Leotta Weingarten 01/31/2020, 12:48 AM

## 2020-01-31 NOTE — Progress Notes (Signed)
EEG completed, results pending. 

## 2020-01-31 NOTE — Progress Notes (Addendum)
ANTICOAGULATION CONSULT NOTE - [facility] for Heparin Indication: pulmonary embolus, stroke and DVT, and recent CABG  Allergies  Allergen Reactions  . Bee Venom Swelling    Massive swelling  . Penicillins Other (See Comments)    UNSPECIFIED REACTION  Did it involve swelling of the face/tongue/throat, SOB, or low BP? Unknown Did it involve sudden or severe rash/hives, skin peeling, or any reaction on the inside of your mouth or nose? Unknown Did you need to seek medical attention at a hospital or doctor's office? Unknown When did it last happen?1945 If all above answers are "NO", may proceed with cephalosporin use.  . Adhesive [Tape] Rash  . Morphine And Related Rash  . Sulfa Antibiotics Nausea And Vomiting and Rash    Patient Measurements: Height: 5\' 3"  (160 cm) Weight: 154 lb 12.2 oz (70.2 kg) IBW/kg (Calculated) : 52.4 Heparin Dosing Weight: 65.3 kg  Vital Signs: Temp: 98.4 F (36.9 C) (03/16 0353) Temp Source: Oral (03/16 0353) BP: 121/59 (03/16 0600) Pulse Rate: 88 (03/16 0600)  Labs: Recent Labs    01/29/20 0354 01/29/20 0354 01/30/20 0438 01/30/20 0438 01/30/20 0955 01/30/20 1200 01/30/20 1200 01/30/20 1746 01/31/20 0550 01/31/20 0559 01/31/20 0626  HGB 11.1*   < > 10.2*   < >  --  9.2*   < >  --  9.6* 10.2*  --   HCT 33.6*   < > 31.1*   < >  --  27.0*  --   --  29.2* 30.0*  --   PLT 184  --  165  --   --   --   --   --  182  --   --   LABPROT  --   --   --   --  14.1  --   --   --   --   --   --   INR  --   --   --   --  1.1  --   --   --   --   --   --   HEPARINUNFRC  --   --   --   --   --   --   --  0.15*  --   --  0.36  CREATININE 1.00  --  1.19*  --   --   --   --   --  0.93  --   --    < > = values in this interval not displayed.    Estimated Creatinine Clearance: 48.3 mL/min (by C-G formula based on SCr of 0.93 mg/dL).   Medical History: Past Medical History:  Diagnosis Date  . Coronary artery disease   .  Family history of adverse reaction to anesthesia    " My Sister did "  . HTN (hypertension)   . Hyperlipidemia   . Hypothyroidism   . Osteoporosis   . Thyroid carcinoma (HCC)     Medications:  Plavix is being held as changing to full anticoagulation with IV Heparin  Assessment: 79 year old female admitted with angina on 3/11 and s/p CABG on 3/12 who had acute stroke symptoms on 3/15 AM. CTA of Head and Neck showed an acute MCA infarct and bilateral subsegmental acute pulmonary emboli. She is now s/p IR guided mechanical thrombectomy this AM and Pharmacy has been consulted to start IV Heparin therapy. Critical care is concerned for acquired thrombophilia and has sent a HIT screen, lupus anticoagulant screen, and a DIC  panel. 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 D-dimer was 1.25, fibrinogen 652. INR was normal at 1.1  Heparin level this evening is therapeutic at 0.36, on 750 units/hr. Hgb stable at 10.2, plt 182. No s/sx of bleeding or infusion issues.   Goal of Therapy:  Heparin level 0.3 to 0.5 units/ml Monitor platelets by anticoagulation protocol: Yes   Plan:  - Continue heparin infusion at 750 units/hr (7.5 ml/hr) - Follow-up HIT screen, lupus anticoagulant screen, and DIC panel - Will continue to monitor for any signs/symptoms of bleeding and will follow up with heparin level in 8 hours   Thank you for allowing pharmacy to be a part of this patient's care.  Antonietta Jewel, PharmD, BCCCP Clinical Pharmacist  Phone: [phone]  Please check AMION for all [place] phone numbers After 10:00 PM, call [place] [phone] 01/31/2020 7:36 AM     ADDENDUM Heparin level came back therapeutic at 0.32, on 750 units/hr. No s/sx of bleeding or infusion issues. Continue at same rate of 750 units/hr and monitor with daily labs.  Antonietta Jewel, PharmD, [place] Clinical Pharmacist

## 2020-02-01 ENCOUNTER — Inpatient Hospital Stay (HOSPITAL_COMMUNITY): Payer: PPO

## 2020-02-01 DIAGNOSIS — I82441 Acute embolism and thrombosis of right tibial vein: Secondary | ICD-10-CM

## 2020-02-01 DIAGNOSIS — I63512 Cerebral infarction due to unspecified occlusion or stenosis of left middle cerebral artery: Secondary | ICD-10-CM

## 2020-02-01 LAB — CBC
HCT: 30.8 % — ABNORMAL LOW (ref 36.0–46.0)
Hemoglobin: 9.9 g/dL — ABNORMAL LOW (ref 12.0–15.0)
MCH: 29.5 pg (ref 26.0–34.0)
MCHC: 32.1 g/dL (ref 30.0–36.0)
MCV: 91.7 fL (ref 80.0–100.0)
Platelets: 199 10*3/uL (ref 150–400)
RBC: 3.36 MIL/uL — ABNORMAL LOW (ref 3.87–5.11)
RDW: 14.6 % (ref 11.5–15.5)
WBC: 10.6 10*3/uL — ABNORMAL HIGH (ref 4.0–10.5)
nRBC: 0 % (ref 0.0–0.2)

## 2020-02-01 LAB — GLUCOSE, CAPILLARY
Glucose-Capillary: 119 mg/dL — ABNORMAL HIGH (ref 70–99)
Glucose-Capillary: 128 mg/dL — ABNORMAL HIGH (ref 70–99)
Glucose-Capillary: 105 mg/dL — ABNORMAL HIGH (ref 70–99)
Glucose-Capillary: 117 mg/dL — ABNORMAL HIGH (ref 70–99)
Glucose-Capillary: 122 mg/dL — ABNORMAL HIGH (ref 70–99)

## 2020-02-01 LAB — BASIC METABOLIC PANEL
Anion gap: 10 (ref 5–15)
BUN: 12 mg/dL (ref 8–23)
CO2: 23 mmol/L (ref 22–32)
Calcium: 9.7 mg/dL (ref 8.9–10.3)
Chloride: 109 mmol/L (ref 98–111)
Creatinine, Ser: 0.76 mg/dL (ref 0.44–1.00)
GFR calc Af Amer: >60 mL/min (ref >60)
GFR calc non Af Amer: >60 mL/min (ref >60)
Glucose, Bld: 124 mg/dL — ABNORMAL HIGH (ref 70–99)
Potassium: 3.6 mmol/L (ref 3.5–5.1)
Sodium: 142 mmol/L (ref 135–145)

## 2020-02-01 LAB — PHOSPHORUS
Phosphorus: 2 mg/dL — ABNORMAL LOW (ref 2.5–4.6)
Phosphorus: 2 mg/dL — ABNORMAL LOW (ref 2.5–4.6)

## 2020-02-01 LAB — HEPARIN LEVEL (UNFRACTIONATED): Heparin Unfractionated: 0.36 IU/mL (ref 0.30–0.70)

## 2020-02-01 LAB — MAGNESIUM
Magnesium: 2 mg/dL (ref 1.7–2.4)
Magnesium: 1.9 mg/dL (ref 1.7–2.4)

## 2020-02-01 MED ORDER — METOPROLOL TARTRATE 25 MG PO TABS: 25.0000 mg | ORAL_TABLET | Freq: Two times a day (BID) | ORAL | Status: DC

## 2020-02-01 MED ORDER — POTASSIUM CHLORIDE 10 MEQ/50ML IV SOLN
10.0000 meq | INTRAVENOUS | Status: AC
  Administered 2020-02-01 (×3): 10 meq via INTRAVENOUS
  Filled 2020-02-01: qty 50

## 2020-02-01 MED ORDER — ISOSORBIDE DINITRATE 10 MG PO TABS
10.0000 mg | ORAL_TABLET | Freq: Three times a day (TID) | ORAL | Status: DC
  Administered 2020-02-01 – 2020-02-17 (×47): 10 mg
  Filled 2020-02-01 (×45): qty 1

## 2020-02-01 MED ORDER — LISINOPRIL 20 MG PO TABS: 20.0000 mg | ORAL_TABLET | Freq: Every day | ORAL | Status: DC

## 2020-02-01 MED ORDER — PRO-STAT SUGAR FREE PO LIQD
30.0000 mL | Freq: Every day | ORAL | Status: DC
  Administered 2020-02-01 – 2020-02-13 (×13): 30 mL
  Filled 2020-02-01 (×13): qty 30

## 2020-02-01 MED ORDER — LISINOPRIL 20 MG PO TABS
20.0000 mg | ORAL_TABLET | Freq: Every day | ORAL | Status: DC
  Administered 2020-02-01 – 2020-02-02 (×2): 20 mg
  Filled 2020-02-01 (×2): qty 1

## 2020-02-01 MED ORDER — METOPROLOL TARTRATE 25 MG PO TABS
25.0000 mg | ORAL_TABLET | Freq: Two times a day (BID) | ORAL | Status: DC
  Administered 2020-02-01 – 2020-02-17 (×33): 25 mg
  Filled 2020-02-01 (×35): qty 1

## 2020-02-01 MED ORDER — VITAL AF 1.2 CAL PO LIQD
1000.0000 mL | ORAL | Status: DC
  Administered 2020-02-01 – 2020-02-07 (×6): 1000 mL

## 2020-02-01 NOTE — Progress Notes (Signed)
2 Days Post-Op Procedure(s) (LRB): IR WITH ANESTHESIA (N/A) Subjective: Intubated/sedated  Objective: Vital signs in last 24 hours: Temp:  [97.7 F (36.5 C)-98.8 F (37.1 C)] 98.4 F (36.9 C) (03/17 1601) Pulse Rate:  [70-95] 74 (03/17 1543) Cardiac Rhythm: Normal sinus rhythm (03/17 0845) Resp:  [14-27] 19 (03/17 1543) BP: (121-162)/(49-72) 162/64 (03/17 1543) SpO2:  [79 %-100 %] 100 % (03/17 1543) Arterial Line BP: (132-190)/(45-66) 189/56 (03/17 1400) FiO2 (%):  [40 %] 40 % (03/17 1543) Weight:  [69.1 kg] 69.1 kg (03/17 0500)  Hemodynamic parameters for last 24 hours:    Intake/Output from previous day: 03/16 0701 - 03/17 0700 In: 1472.2 [I.V.:1004.9; NG/GT:40; IV Piggyback:427.3] Out: 2030 [Urine:1840; Chest Tube:190] Intake/Output this shift: Total I/O In: 455.4 [I.V.:244.2; IV Piggyback:211.2] Out: 1150 [Urine:970; Chest Tube:180]  General appearance: sedated Neurologic: right sided weakness Heart: regular rate and rhythm, S1, S2 normal, no murmur, click, rub or gallop Lungs: diminished breath sounds RLL Abdomen: soft, non-tender; bowel sounds normal; no masses,  no organomegaly Extremities: edema 1+ Wound: dressed, dry  Lab Results: Recent Labs    01/31/20 0550 01/31/20 0550 01/31/20 0559 02/01/20 0349  WBC 11.4*  --   --  10.6*  HGB 9.6*   < > 10.2* 9.9*  HCT 29.2*   < > 30.0* 30.8*  PLT 182  --   --  199   < > = values in this interval not displayed.   BMET:  Recent Labs    01/31/20 0550 01/31/20 0550 01/31/20 0559 02/01/20 0349  NA 140   < > 139 142  K 3.7   < > 3.5 3.6  CL 106  --   --  109  CO2 25  --   --  23  GLUCOSE 120*  --   --  124*  BUN 14  --   --  12  CREATININE 0.93  --   --  0.76  CALCIUM 9.5  --   --  9.7   < > = values in this interval not displayed.    PT/INR:  Recent Labs    01/30/20 0955  LABPROT 14.1  INR 1.1   ABG    Component Value Date/Time   PHART 7.429 01/31/2020 0559   HCO3 27.4 01/31/2020 0559   TCO2  29 01/31/2020 0559   ACIDBASEDEF 2.0 01/28/2020 1545   O2SAT 96.0 01/31/2020 0559   CBG (last 3)  Recent Labs    02/01/20 0827 02/01/20 1216 02/01/20 1600  GLUCAP 128* 105* 117*    Assessment/Plan: S/P Procedure(s) (LRB): IR WITH ANESTHESIA (N/A) Extubate soon or consider tracheostomy.    LOS: 6 days    [name] [name] 02/01/2020

## 2020-02-01 NOTE — Progress Notes (Signed)
Nutrition Follow-up  DOCUMENTATION CODES:   Not applicable  INTERVENTION:   Tube Feeding via Cortrak:  Vital AF 1.2 at 45 ml/hr  Pro-Stat 30 mL daily Provides 1396 kcals, 96 of protein and 875 mL of free water Meets 100% of estimated calorie and protein needs  NUTRITION DIAGNOSIS:   Inadequate oral intake related to inability to eat as evidenced by NPO status.  Being addressed via TF   GOAL:   Patient will meet greater than or equal to 90% of their needs  Progressing  MONITOR:   Vent status, I & O's  REASON FOR ASSESSMENT:   Ventilator    ASSESSMENT:   Pt with PMH of HTN, HLD, and thyroid cancer admitted 3/11 for chest pain found to have 4-vessel CAD and underwent CABG 3/13.   3/11 Admitted 3/13 CABG 3/15 L MCA infarct with left M1 occlusion s/p thromecbtomy, Intubated 3/16 Seizures, started on Keppra 3/17 Cortrak placed  Patient is currently intubated on ventilator support, no sedation, minimally responsive MV: 7.5 L/min Temp (24hrs), Avg:98.4 F (36.9 C), Min:97.7 F (36.5 C), Max:98.8 F (37.1 C)  Propofol: NONE  Cortrak today  Current weight 69.1 kg; admission weight 65 kg.   Labs: reviewed Meds: reviewed    Diet Order:   Diet Order            Diet NPO time specified  Diet effective now              EDUCATION NEEDS:   No education needs have been identified at this time  Skin:  Skin Assessment: Reviewed RN Assessment  Last BM:  3/17  Height:   Ht Readings from Last 1 Encounters:  01/27/20 5\' 3"  (1.6 m)    Weight:   Wt Readings from Last 1 Encounters:  02/01/20 69.1 kg    Ideal Body Weight:  52.2 kg  BMI:  Body mass index is 26.99 kg/m.  Estimated Nutritional Needs:   Kcal:  1315  Protein:  95-110 grams  Fluid:  >1.5 L/day   Kerman Passey MS, RDN, LDN, CNSC RD Pager Number and Weekend/On-Call After Hours Pager Located in [place]

## 2020-02-01 NOTE — Progress Notes (Signed)
Referring Physician(s): Code Stroke- [name]  Supervising Physician: Pedro Earls  Patient Status:  [name] - In-pt  Chief Complaint: None- intubated without sedation  Subjective:  History of acute CVA s/p cerebral arteriogram with emergent mechanical thrombectomy of left MCA M1 occlusion achieving a TICI 3 revascularization 01/30/2020 by Dr. Karenann Cai. Patient laying in bed intubated without sedation. Appears more alert today- opens eyes to name and follows simple commands. Can wiggle bilateral toes, can spontaneously move LUE, and demonstrates minimal flicker of RUE. Right groin incision c/d/i.   Allergies: Bee venom, Penicillins, Adhesive [tape], Morphine and related, and Sulfa antibiotics  Medications: Prior to Admission medications   Medication Sig Start Date End Date Taking? Authorizing Provider  acetaminophen (TYLENOL) 500 MG tablet Take 1,000 mg by mouth in the morning and at bedtime.    Yes [provider]  aspirin EC 81 MG tablet Take 81 mg by mouth at bedtime.   Yes [provider]  atorvastatin (LIPITOR) 80 MG tablet Take 80 mg by mouth daily at 12 noon.    Yes [provider]  cholecalciferol (VITAMIN D) 1000 UNITS tablet Take 1,000 Units by mouth daily at 12 noon.    Yes [provider]  fluticasone (FLONASE) 50 MCG/ACT nasal spray Place 2 sprays into both nostrils daily. 12/19/19  Yes [provider]  levothyroxine (SYNTHROID, LEVOTHROID) 200 MCG tablet Take 100-200 mcg by mouth See admin instructions. Take 200 mcg  SUN-TUES-THUR-SAT Take 100 mcg tab MON-WED-FRI  DO NOT USE GENERIC   Yes [provider]  lisinopril (ZESTRIL) 20 MG tablet Take 20 mg by mouth daily.    Yes [provider]  metoprolol tartrate (LOPRESSOR) 25 MG tablet Take 1 tablet (25 mg total) by mouth 2 (two) times daily. Patient taking differently: Take 25 mg by mouth 2 (two) times daily after a meal.   01/04/20 04/03/20 Yes [name], [name], MD  nitroGLYCERIN (NITROSTAT) 0.4 MG SL tablet Place 1 tablet (0.4 mg total) under the tongue every 5 (five) minutes as needed for chest pain. 11/22/19 02/20/20 Yes [name], [name], MD  Prenatal Vit-Fe Fumarate-FA (PRENATAL MULTIVITAMIN) TABS tablet Take 1 tablet by mouth daily at 12 noon.   Yes [provider]  vitamin B-12 (CYANOCOBALAMIN) 1000 MCG tablet Take 2,500 mcg by mouth daily.   Yes [provider]     Vital Signs: BP (!) 146/61   Pulse 88   Temp 98.4 F (36.9 C) (Oral)   Resp (!) 23   Ht _0  (1.6 m)   Wt 152 lb 5.4 oz (69.1 kg)   SpO2 99%   BMI 26.99 kg/m   Physical Exam Vitals and nursing note reviewed.  Constitutional:      General: She is not in acute distress.    Comments: Intubated without sedation.   Pulmonary:     Effort: Pulmonary effort is normal. No respiratory distress.     Comments: Intubated without sedation.  Skin:    General: Skin is warm and dry.     Comments: Right groin incision soft without active bleeding or hematoma.  Neurological:     Comments: Intubated without sedation. Appears more alert today- opens eyes to voice and follows simple commands. PERRL bilaterally. Can wiggle bilateral toes, can spontaneously move LUE, and demonstrates minimal flicker of RUE. Distal pulses 1+ bilaterally.  Psychiatric:     Comments: Intubated without sedation.      Imaging: CT Code Stroke CTA Head W/WO  contrast  Result Date: 01/30/2020 CLINICAL DATA:  Stroke symptoms EXAM: CT ANGIOGRAPHY HEAD AND NECK CT PERFUSION BRAIN TECHNIQUE: Multidetector CT imaging of the head and neck was performed using the standard protocol during bolus administration of intravenous contrast. Multiplanar CT image reconstructions and MIPs were obtained to evaluate the vascular anatomy. Carotid stenosis measurements (when applicable) are obtained utilizing NASCET criteria, using the distal internal carotid diameter as  the denominator. Multiphase CT imaging of the brain was performed following IV bolus contrast injection. Subsequent parametric perfusion maps were calculated using RAPID software. CONTRAST:  Dose is not available COMPARISON:  Head CT from earlier today FINDINGS: CTA NECK FINDINGS Aortic arch: Atherosclerosis.  Changes of recent cardiac surgery. Right carotid system: Atherosclerotic plaque primarily at the bifurcation with up to 40% stenosis Left carotid system: Advanced atheromatous narrowing at the ICA bulb, flow reducing and measuring 80% on sagittal reformats. No dissection. Vertebral arteries: No proximal subclavian stenosis. The right vertebral artery is dominant and diffusely patent. Proximal left vertebral occlusion with reconstitution and robust flow by the dura. 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 Other neck: No acute finding. Upper chest: Bilateral upper lobe subsegmental pulmonary emboli with central branching acute appearance. Review of the MIP images confirms the above findings CTA HEAD FINDINGS Anterior circulation: Left M1 occlusion. Atherosclerotic calcification of both carotid siphons with moderate narrowing at the left paraclinoid segment. Posterior circulation: The right vertebral artery is dominant. Diffuse atheromatous plaque seen along the left V4 segment with patency to the basilar. The basilar is smooth and diffusely patent. No PCA branch occlusion or flow limiting stenosis. Mild right P2 segment narrowing. Venous sinuses: Unremarkable in the arterial phase Anatomic variants: None significant Review of the MIP images confirms the above findings CT Brain Perfusion Findings: ASPECTS: 10 CBF (<30%) Volume: 13m Perfusion (Tmax>6.0s) volume: 1567mMismatch Volume: 85 cc of penumbramL Critical Value/emergent results were called by telephone at the time of interpretation on 01/30/2020 at 7:01 am [facility] verbally acknowledged these results.  IMPRESSION: 1. Emergent large vessel occlusion at the left M1 segment. 2. Aspects of 10 with CT perfusion suggesting core infarct of 74 cc and 85 cc of penumbra. 3. Flow reducing left ICA bulb stenosis. 4. Bilateral subsegmental acute pulmonary emboli. 5. Proximal occlusion of the non dominant left vertebral artery with reconstitution at the dura. Electronically Signed   By: JoMonte Fantasia.D.   On: 01/30/2020 07:04   CT HEAD WO CONTRAST  Result Date: 01/31/2020 CLINICAL DATA:  Post left M1 thrombectomy EXAM: CT HEAD WITHOUT CONTRAST TECHNIQUE: Contiguous axial images were obtained from the base of the skull through the vertex without intravenous contrast. COMPARISON:  Earlier same day FINDINGS: Brain: There is diminished visualization the left lentiform nucleus. Stable potential small area of cortical hypoattenuation in the left frontal lobe, which could reflect volume averaging. There is no acute intracranial hemorrhage or mass effect. Ventricles are stable in size. Vascular: No new findings. Skull: Calvarium is unremarkable. Sinuses/Orbits: No acute finding. Other: None. IMPRESSION: No acute intracranial hemorrhage. Likely evolving recent infarction of the lentiform nucleus. Electronically Signed   By: PrMacy Mis.D.   On: 01/31/2020 12:06   CT HEAD WO CONTRAST  Result Date: 01/31/2020 CLINICAL DATA:  Status post revascularization of left M1 occlusion. EXAM: CT HEAD WITHOUT CONTRAST TECHNIQUE: Contiguous axial images were obtained from the base of the skull through the vertex without intravenous contrast. COMPARISON:  CT head without contrast 01/30/2020 FINDINGS: Brain: Small  nonhemorrhagic cortical infarct is present in the inferior left frontal gyrus on axial image 24 and coronal image 31. No other acute or focal cortical infarct is present. Precentral gyrus is intact. Mild white matter changes are again seen. The left lentiform nucleus is less well-defined than on the right. Insular cortex is  intact. Caudate lobe is normal. Internal capsule is normal. No hemorrhage is present. The ventricles are of normal size. No significant extraaxial fluid collection is present. The brainstem and cerebellum are within normal limits. Vascular: Atherosclerotic calcifications are present within the cavernous internal carotid arteries and at the dural margin of both vertebral arteries. No hyperdense vessel is present. Skull: Calvarium is intact. No focal lytic or blastic lesions are present. No significant extracranial soft tissue lesion is present. Sinuses/Orbits: The paranasal sinuses and mastoid air cells are clear. The globes and orbits are within normal limits. IMPRESSION: 1. No significant hemorrhage following revascularization of left M1 occlusion. 2. Small cortical infarct in the anterior left frontal lobe. 3. Question ischemic infarct of the left lentiform nucleus, less well seen than on the right. 4. No other significant cortical or basal ganglia infarct. Electronically Signed   By: San Morelle M.D.   On: 01/31/2020 07:20   CT Code Stroke CTA Neck W/WO contrast  Result Date: 01/30/2020 CLINICAL DATA:  Stroke symptoms EXAM: CT ANGIOGRAPHY HEAD AND NECK CT PERFUSION BRAIN TECHNIQUE: Multidetector CT imaging of the head and neck was performed using the standard protocol during bolus administration of intravenous contrast. Multiplanar CT image reconstructions and MIPs were obtained to evaluate the vascular anatomy. Carotid stenosis measurements (when applicable) are obtained utilizing NASCET criteria, using the distal internal carotid diameter as the denominator. Multiphase CT imaging of the brain was performed following IV bolus contrast injection. Subsequent parametric perfusion maps were calculated using RAPID software. CONTRAST:  Dose is not available COMPARISON:  Head CT from earlier today FINDINGS: CTA NECK FINDINGS Aortic arch: Atherosclerosis.  Changes of recent cardiac surgery. Right carotid  system: Atherosclerotic plaque primarily at the bifurcation with up to 40% stenosis Left carotid system: Advanced atheromatous narrowing at the ICA bulb, flow reducing and measuring 80% on sagittal reformats. No dissection. Vertebral arteries: No proximal subclavian stenosis. The right vertebral artery is dominant and diffusely patent. Proximal left vertebral occlusion with reconstitution and robust flow by the dura. 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 Other neck: No acute finding. Upper chest: Bilateral upper lobe subsegmental pulmonary emboli with central branching acute appearance. Review of the MIP images confirms the above findings CTA HEAD FINDINGS Anterior circulation: Left M1 occlusion. Atherosclerotic calcification of both carotid siphons with moderate narrowing at the left paraclinoid segment. Posterior circulation: The right vertebral artery is dominant. Diffuse atheromatous plaque seen along the left V4 segment with patency to the basilar. The basilar is smooth and diffusely patent. No PCA branch occlusion or flow limiting stenosis. Mild right P2 segment narrowing. Venous sinuses: Unremarkable in the arterial phase Anatomic variants: None significant Review of the MIP images confirms the above findings CT Brain Perfusion Findings: ASPECTS: 10 CBF (<30%) Volume: 51m Perfusion (Tmax>6.0s) volume: 154mMismatch Volume: 85 cc of penumbramL Critical Value/emergent results were called by telephone at the time of interpretation on 01/30/2020 at 7:01 am [facility] who verbally acknowledged these results. IMPRESSION: 1. Emergent large vessel occlusion at the left M1 segment. 2. Aspects of 10 with CT perfusion suggesting core infarct of 74 cc and 85 cc of penumbra. 3. Flow reducing left  ICA bulb stenosis. 4. Bilateral subsegmental acute pulmonary emboli. 5. Proximal occlusion of the non dominant left vertebral artery with reconstitution at the dura. Electronically Signed    By: Monte Fantasia M.D.   On: 01/30/2020 07:04   IR CT Head Ltd  Result Date: 01/30/2020 CLINICAL DATA:  79 year old female with past medical history of coronary artery disease, hypertension, hyperlipidemia and thyroid cancer who was admitted for three-vessel CABG performed 2 days ago. She developed the sudden right-sided weakness. Last known well was 6 a.m. today. Baseline mRS is unknown (likely 0-1). No IV tPA given due to recent surgery. Head CT showed no acute infarct or hemorrhage. CT angiogram showed a left ICA T occlusion extending to the MCA bifurcation with moderate to poor collaterals. CT perfusion showing possible core infarct of 74 mL with a mismatch core/penumbra of 85 mL. She was brought to our service for an emergency mechanical thrombectomy. EXAM: IR PERCUTANEOUS ART THORMBECTOMY/INFUSION INTRACRANIAL INCLUDE DIAG ANGIO; IR CT HEAD LIMITED ANESTHESIA/SEDATION: General MEDICATIONS: Intra-arterial verapamil 5 mg PROCEDURE: DIAGNOSTIC CEREBRAL ANGIOGRAM AND MECHANICAL THROMBECTOMY/ASPIRATION Operator: Dr. Pedro Earls, MD. Informed consent was obtained from patient's next of kin and attach to patient's chart. The patient was prepped and draped using usual sterile technique. Using a micropuncture kit and modified Seldinger technique, access was gained to the right common femoral artery and an 8 French sheath was placed. Under fluoroscopy, an 8 French walrus balloon guide catheter was navigated over a 6 [place] Berenstein II catheter and a 0.038" Terumo Glidewire into the aortic arch. Under fluoroscopy, the catheter was placed into the left common carotid artery. Frontal and lateral angiograms of the neck were obtained. Atherosclerotic disease is seen involving the distal left common carotid artery, carotid bifurcation and proximal cervical left ICA, more pronounced at the level of the bulb where a calcified plaque causes approximately 45% stenosis. A proximal left M1/MCA occlusion  is identified with moderate collaterals from the left ACA to the left MCA territory. Using biplane roadmap, a catalyst 6 aspiration catheter was navigated over a phenom 21 microcatheter and a synchro support microguidewire into the petrous segment of the left ICA. Frontal and lateral angiograms of the head were obtained, confirming the proximal left M1 occlusion. The microcatheter was then navigated over the microwire into the left M2/MCA middle division branch. A 6 mm solitaire thrombectomy device was then deployed spanning the left M1 and left M2/MCA middle division branch. The microcatheter was removed under fluoroscopy. The thrombectomy device was allowed to intercalated with the clot for 4 minutes. The guiding catheter balloon was inflated in the upper cervical segment of the left ICA. The thrombectomy device and aspiration catheter were then removed under constant aspiration. Follow-up angiogram showed recanalization of the left M1/MCA with slow flow in the middle division branch related to device induced vasospasm. Intra arterial infusion of 5 mg of verapamil was performed to the left ICA over 10 minutes. Follow-up angiogram showed near complete resolution of the vasospasm with complete restoration of flow (TICI 3). Postprocedure flat panel CT of the head was obtained. Images were post processed in a separate workstation under concurrent attending physician supervision. No evidence of hemorrhagic complication identified. Left common femoral artery angiogram in frontal and lateral views showed access at the level of the common femoral artery. The sheath was removed and the access site was closed with a Perclose ProGlide. Immediate hemostasis achieved. COMPLICATIONS: None immediate FINDINGS: 1. Atherosclerotic disease of the left carotid bifurcation with calcified plaque resulting in  45% stenosis. 2. 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 IMPRESSION: 1. Successful and uncomplicated mechanical  thrombectomy and aspiration for treatment of a left M1/MCA proximal occlusion with complete restoration of flow (TICI 3) after 1 thrombectomy device pass. 2. No hemorrhagic complication or embolus to new territory. PLAN: Follow-up 1 month after discharge. Carotid duplex recommended prior to consultation. Electronically Signed   By: Pedro Earls M.D.   On: 01/30/2020 16:43   CT Code Stroke Cerebral Perfusion with contrast  Result Date: 01/30/2020 CLINICAL DATA:  Stroke symptoms EXAM: CT ANGIOGRAPHY HEAD AND NECK CT PERFUSION BRAIN TECHNIQUE: Multidetector CT imaging of the head and neck was performed using the standard protocol during bolus administration of intravenous contrast. Multiplanar CT image reconstructions and MIPs were obtained to evaluate the vascular anatomy. Carotid stenosis measurements (when applicable) are obtained utilizing NASCET criteria, using the distal internal carotid diameter as the denominator. Multiphase CT imaging of the brain was performed following IV bolus contrast injection. Subsequent parametric perfusion maps were calculated using RAPID software. CONTRAST:  Dose is not available COMPARISON:  Head CT from earlier today FINDINGS: CTA NECK FINDINGS Aortic arch: Atherosclerosis.  Changes of recent cardiac surgery. Right carotid system: Atherosclerotic plaque primarily at the bifurcation with up to 40% stenosis Left carotid system: Advanced atheromatous narrowing at the ICA bulb, flow reducing and measuring 80% on sagittal reformats. No dissection. Vertebral arteries: No proximal subclavian stenosis. The right vertebral artery is dominant and diffusely patent. Proximal left vertebral occlusion with reconstitution and robust flow by the dura. 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 Other neck: No acute finding. Upper chest: Bilateral upper lobe subsegmental pulmonary emboli with central branching acute appearance. Review of the MIP images  confirms the above findings CTA HEAD FINDINGS Anterior circulation: Left M1 occlusion. Atherosclerotic calcification of both carotid siphons with moderate narrowing at the left paraclinoid segment. Posterior circulation: The right vertebral artery is dominant. Diffuse atheromatous plaque seen along the left V4 segment with patency to the basilar. The basilar is smooth and diffusely patent. No PCA branch occlusion or flow limiting stenosis. Mild right P2 segment narrowing. Venous sinuses: Unremarkable in the arterial phase Anatomic variants: None significant Review of the MIP images confirms the above findings CT Brain Perfusion Findings: ASPECTS: 10 CBF (<30%) Volume: 60m Perfusion (Tmax>6.0s) volume: 1533mMismatch Volume: 85 cc of penumbramL Critical Value/emergent results were called by telephone at the time of interpretation on 01/30/2020 at 7:01 am [facility] who verbally acknowledged these results. IMPRESSION: 1. Emergent large vessel occlusion at the left M1 segment. 2. Aspects of 10 with CT perfusion suggesting core infarct of 74 cc and 85 cc of penumbra. 3. Flow reducing left ICA bulb stenosis. 4. Bilateral subsegmental acute pulmonary emboli. 5. Proximal occlusion of the non dominant left vertebral artery with reconstitution at the dura. Electronically Signed   By: JoMonte Fantasia.D.   On: 01/30/2020 07:04   DG CHEST PORT 1 VIEW  Result Date: 01/30/2020 CLINICAL DATA:  Intubation of airway EXAM: PORTABLE CHEST 1 VIEW COMPARISON:  01/30/2020 FINDINGS: Endotracheal tube tip 2 cm above the carina. NGT placed with the tip in the stomach. Right jugular central venous catheter tips unchanged. Two catheters in place in the right SVC. Bilateral chest tubes in place. Mediastinal drain in place. No pneumothorax. Right lower lobe atelectasis unchanged. Right hilar surgical clips again noted. No significant effusion. IMPRESSION: Endotracheal tube placed with the tip 2 cm above the carina. Right  lower lobe atelectasis unchanged. Bilateral chest tubes without pneumothorax. Electronically Signed   By: Franchot Gallo M.D.   On: 01/30/2020 10:07   DG Chest Port 1 View  Result Date: 01/30/2020 CLINICAL DATA:  Chest tube, post open heart surgery EXAM: PORTABLE CHEST 1 VIEW COMPARISON:  Portable exam 0506 hours compared to 01/29/2020 FINDINGS: Mediastinal drain and BILATERAL thoracostomy tubes present. Two RIGHT jugular central venous catheters, tips projecting over SVC and cavoatrial junction. Epicardial pacing wires present. Enlargement of cardiac silhouette with pulmonary vascular congestion. Prominent mediastinum consistent with preceding surgery. Scattered atelectasis in the mid to lower lungs bilaterally with small RIGHT pleural effusion. Tiny LEFT apex pneumothorax, decreased. Bones demineralized with LEFT glenohumeral degenerative changes noted as well as degenerative changes of the thoracic spine. IMPRESSION: Bibasilar atelectasis and RIGHT pleural effusion. Tiny LEFT apex pneumothorax despite thoracostomy tube, decreased. Electronically Signed   By: Lavonia Dana M.D.   On: 01/30/2020 07:55   DG Chest Port 1 View  Result Date: 01/29/2020 CLINICAL DATA:  Open heart surgery EXAM: PORTABLE CHEST 1 VIEW COMPARISON:  01/28/2020 FINDINGS: Interval extubation and removal of enteric tube. Interval removal of right IJ Swan-Ganz catheter. Stable right IJ venous sheath and additional right IJ venous catheter terminating in the upper right atrium. Bilateral chest tubes and mediastinal drain. Tiny left apical pneumothorax, new. Mild subcutaneous emphysema along the right pectoralis, unchanged. Cardiomegaly.  Postsurgical changes related to prior CABG. IMPRESSION: Postsurgical changes related to prior CABG. Tiny left apical pneumothorax, new. Stable bilateral chest tubes and mediastinal drain. Interval removal of endotracheal tube, enteric tube, and right IJ Swan-Ganz catheter. Electronically Signed   By:  Julian Hy M.D.   On: 01/29/2020 07:30   EEG adult  Result Date: 01/31/2020 Lora Havens, MD     01/31/2020  9:42 AM Patient Name: [name] [name] MRN: [id number] Epilepsy Attending: Lora Havens Referring Physician/Provider: Dr Rosalin Hawking Date: 01/31/2020 Duration: 24.48 mins Patient history: 79yo F with left MCA infarct continues to be lethargic. EEG to evaluate for seizure. Level of alertness: lethargic AEDs during EEG study: LEV Technical aspects: This EEG study was done with scalp electrodes positioned according to the 10-20 International system of electrode placement. Electrical activity was acquired at a sampling rate of _0  and reviewed with a high frequency filter of _1  and a low frequency filter of _2 . EEG data were recorded continuously and digitally stored. DESCRIPTION: EEG showed continuous generalized and lateralized left hemisphere 3-_3  theta-delta slowing. Sharp waves were seen in left frontotemporal region, maximal T7. Hyperventilation and photic stimulation were not performed. 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 No seizures were seen throughout the recording. Lora Havens   ECHOCARDIOGRAM COMPLETE  Result Date: 01/30/2020    ECHOCARDIOGRAM REPORT   Patient Name:   [name] [name] Date of Exam: 01/30/2020 Medical Rec #:  [id number]          Height:       63.0 in Accession #:    [id number]         Weight:       156.1 lb Date of Birth:  10/18/1941          BSA:          1.740 m Patient Age:    30 years  BP:           133/64 mmHg Patient Gender: F                  HR:           67 bpm. Exam Location:  Inpatient Procedure: 2D Echo Indications:    Stroke 434.91 / I163.9  History:        Patient has  prior history of Echocardiogram examinations. CAD,                 Prior CABG; Risk Factors:Hypertension and Dyslipidemia. External                 pacemaker.  Sonographer:    Darlina Sicilian RDCS Referring Phys: [id number] ASHISH ARORA IMPRESSIONS  1. Left ventricular ejection fraction, by estimation, is 30 to 35%. The left ventricle has moderately decreased function. The left ventricle demonstrates global hypokinesis. There is mild asymmetric left ventricular hypertrophy of the septal segment. Left ventricular diastolic parameters are consistent with Grade I diastolic dysfunction (impaired relaxation). Elevated left ventricular end-diastolic pressure.  2. Right ventricular systolic function is normal. The right ventricular size is normal. There is mildly elevated pulmonary artery systolic pressure.  3. Left atrial size was mildly dilated.  4. The mitral valve is normal in structure. Trivial mitral valve regurgitation. No evidence of mitral stenosis.  5. The aortic valve is normal in structure. Aortic valve regurgitation is not visualized. No aortic stenosis is present.  6. There is mild dilatation of the ascending aorta measuring 38 mm.  7. The inferior vena cava is normal in size with greater than 50% respiratory variability, suggesting right atrial pressure of 3 mmHg. FINDINGS  Left Ventricle: Left ventricular ejection fraction, by estimation, is 30 to 35%. The left ventricle has moderately decreased function. The left ventricle demonstrates global hypokinesis. The left ventricular internal cavity size was normal in size. There is mild asymmetric left ventricular hypertrophy of the septal segment. Left ventricular diastolic parameters are consistent with Grade I diastolic dysfunction (impaired relaxation). Elevated left ventricular end-diastolic pressure. Right Ventricle: The right ventricular size is normal. No increase in right ventricular wall thickness. Right ventricular systolic function is normal. There is  mildly elevated pulmonary artery systolic pressure. The tricuspid regurgitant velocity is 2.40  m/s, and with an assumed right atrial pressure of 15 mmHg, the estimated right ventricular systolic pressure is 39.0 mmHg. Left Atrium: Left atrial size was mildly dilated. Right Atrium: Right atrial size was normal in size. Pericardium: There is no evidence of pericardial effusion. Mitral Valve: The mitral valve is normal in structure. Normal mobility of the mitral valve leaflets. Trivial mitral valve regurgitation. No evidence of mitral valve stenosis. Tricuspid Valve: The tricuspid valve is normal in structure. Tricuspid valve regurgitation is mild . No evidence of tricuspid stenosis. Aortic Valve: The aortic valve is normal in structure. Aortic valve regurgitation is not visualized. No aortic stenosis is present. Pulmonic Valve: The pulmonic valve was normal in structure. Pulmonic valve regurgitation is mild. No evidence of pulmonic stenosis. Aorta: The aortic root is normal in size and structure. There is mild dilatation of the ascending aorta measuring 38 mm. Venous: The inferior vena cava is normal in size with greater than 50% respiratory variability, suggesting right atrial pressure of 3 mmHg. IAS/Shunts: No atrial level shunt detected by color flow Doppler.  LEFT VENTRICLE PLAX 2D LVIDd:         3.60 cm     Diastology LVIDs:  2.40 cm     LV e' lateral:   4.91 cm/s LV PW:         1.00 cm     LV E/e' lateral: 14.3 LV IVS:        1.10 cm     LV e' medial:    3.59 cm/s                            LV E/e' medial:  19.6  LV Volumes (MOD) LV vol d, MOD A2C: 69.0 ml LV vol d, MOD A4C: 88.7 ml LV vol s, MOD A2C: 48.3 ml LV vol s, MOD A4C: 57.1 ml LV SV MOD A2C:     20.7 ml LV SV MOD A4C:     88.7 ml LV SV MOD BP:      26.0 ml LEFT ATRIUM             Index       RIGHT ATRIUM           Index LA diam:        3.40 cm 1.95 cm/m  RA Area:     11.10 cm LA Vol (A2C):   32.3 ml 18.56 ml/m RA Volume:   21.20 ml  12.18  ml/m LA Vol (A4C):   55.4 ml 31.83 ml/m LA Biplane Vol: 43.2 ml 24.82 ml/m  AORTIC VALVE LVOT Vmax:   71.10 cm/s LVOT Vmean:  47.400 cm/s LVOT VTI:    0.141 m  AORTA Ao Root diam: 2.60 cm Ao Asc diam:  3.80 cm MITRAL VALVE                TRICUSPID VALVE MV Area (PHT): 3.53 cm     TR Peak grad:   23.0 mmHg MV Decel Time: 215 msec     TR Vmax:        240.00 cm/s MV E velocity: 70.30 cm/s MV A velocity: 104.00 cm/s  SHUNTS MV E/A ratio:  0.68         Systemic VTI: 0.14 m Skeet Latch MD Electronically signed by Skeet Latch MD Signature Date/Time: 01/30/2020/5:25:49 PM    Final    IR PERCUTANEOUS ART THROMBECTOMY/INFUSION INTRACRANIAL INC DIAG ANGIO  Result Date: 01/30/2020 CLINICAL DATA:  79 year old female with past medical history of coronary artery disease, hypertension, hyperlipidemia and thyroid cancer who was admitted for three-vessel CABG performed 2 days ago. She developed the sudden right-sided weakness. Last known well was 6 a.m. today. Baseline mRS is unknown (likely 0-1). No IV tPA given due to recent surgery. Head CT showed no acute infarct or hemorrhage. CT angiogram showed a left ICA T occlusion extending to the MCA bifurcation with moderate to poor collaterals. CT perfusion showing possible core infarct of 74 mL with a mismatch core/penumbra of 85 mL. She was brought to our service for an emergency mechanical thrombectomy. EXAM: IR PERCUTANEOUS ART THORMBECTOMY/INFUSION INTRACRANIAL INCLUDE DIAG ANGIO; IR CT HEAD LIMITED ANESTHESIA/SEDATION: General MEDICATIONS: Intra-arterial verapamil 5 mg PROCEDURE: DIAGNOSTIC CEREBRAL ANGIOGRAM AND MECHANICAL THROMBECTOMY/ASPIRATION Operator: Dr. Pedro Earls, MD. Informed consent was obtained from patient's next of kin and attach to patient's chart. The patient was prepped and draped using usual sterile technique. Using a micropuncture kit and modified Seldinger technique, access was gained to the right common femoral artery and an 8  French sheath was placed. Under fluoroscopy, an 8 French walrus balloon guide catheter was navigated over a 6 [place] II  catheter and a 0.038" Terumo Glidewire into the aortic arch. Under fluoroscopy, the catheter was placed into the left common carotid artery. Frontal and lateral angiograms of the neck were obtained. Atherosclerotic disease is seen involving the distal left common carotid artery, carotid bifurcation and proximal cervical left ICA, more pronounced at the level of the bulb where a calcified plaque causes approximately 45% stenosis. A proximal left M1/MCA occlusion is identified with moderate collaterals from the left ACA to the left MCA territory. Using biplane roadmap, a catalyst 6 aspiration catheter was navigated over a phenom 21 microcatheter and a synchro support microguidewire into the petrous segment of the left ICA. Frontal and lateral angiograms of the head were obtained, confirming the proximal left M1 occlusion. The microcatheter was then navigated over the microwire into the left M2/MCA middle division branch. A 6 mm solitaire thrombectomy device was then deployed spanning the left M1 and left M2/MCA middle division branch. The microcatheter was removed under fluoroscopy. The thrombectomy device was allowed to intercalated with the clot for 4 minutes. The guiding catheter balloon was inflated in the upper cervical segment of the left ICA. The thrombectomy device and aspiration catheter were then removed under constant aspiration. Follow-up angiogram showed recanalization of the left M1/MCA with slow flow in the middle division branch related to device induced vasospasm. Intra arterial infusion of 5 mg of verapamil was performed to the left ICA over 10 minutes. Follow-up angiogram showed near complete resolution of the vasospasm with complete restoration of flow (TICI 3). Postprocedure flat panel CT of the head was obtained. Images were post processed in a separate workstation  under concurrent attending physician supervision. No evidence of hemorrhagic complication identified. Left common femoral artery angiogram in frontal and lateral views showed access at the level of the common femoral artery. The sheath was removed and the access site was closed with a Perclose ProGlide. Immediate hemostasis achieved. COMPLICATIONS: None immediate FINDINGS: 1. 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 2. 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 IMPRESSION: 1. Successful and uncomplicated mechanical thrombectomy and aspiration for treatment of a left M1/MCA proximal occlusion with complete restoration of flow (TICI 3) after 1 thrombectomy device pass. 2. No hemorrhagic complication or embolus to new territory. PLAN: Follow-up 1 month after discharge. Carotid duplex recommended prior to consultation. Electronically Signed   By: Pedro Earls M.D.   On: 01/30/2020 16:43   CT HEAD CODE STROKE WO CONTRAST  Result Date: 01/30/2020 CLINICAL DATA:  Code stroke. Ataxia. Less responsive. Left-sided gaze. EXAM: CT HEAD WITHOUT CONTRAST TECHNIQUE: Contiguous axial images were obtained from the base of the skull through the vertex without intravenous contrast. COMPARISON:  Brain MRI 09/06/2018 FINDINGS: Brain: No evidence of acute infarction, hemorrhage, hydrocephalus, extra-axial collection or mass lesion/mass effect. Vascular: Hyperdense left M1 segment Skull: Negative Sinuses/Orbits: Negative Other: These results were communicated to Dr. Rory Percy at 6:35 amon 3/15/2021by text page via the [facility] messaging system. ASPECTS [facility] Stroke Program Early CT Score) - Ganglionic level infarction (caudate, lentiform nuclei, internal capsule, insula, M1-M3 cortex): 7 - Supraganglionic infarction (M4-M6 cortex): 3 Total score (0-10 with 10 being normal): 10 IMPRESSION: 1. Hyperdense left M1 segment.  CTA and perfusion are in  progress. 2. ASPECTS is 10. Electronically Signed   By: Monte Fantasia M.D.   On: 01/30/2020 06:38   VAS [place] LOWER EXTREMITY VENOUS (DVT)  Result Date: 01/30/2020  Lower Venous DVTStudy Indications: Pulmonary embolism.  Risk  Factors: Confirmed PE. Limitations: Poor ultrasound/tissue interface and patient positioning, patient immobility. Comparison Study: No prior studies. Performing Technologist: Oliver Hum RVT  Examination Guidelines: A complete evaluation includes B-mode imaging, spectral Doppler, color Doppler, and power Doppler as needed of all accessible portions of each vessel. Bilateral testing is considered an integral part of a complete examination. Limited examinations for reoccurring indications may be performed as noted. The reflux portion of the exam is performed with the patient in reverse Trendelenburg.  +---------+---------------+---------+-----------+----------+--------------+ RIGHT    CompressibilityPhasicitySpontaneityPropertiesThrombus Aging +---------+---------------+---------+-----------+----------+--------------+ CFV      Full           Yes      Yes                                 +---------+---------------+---------+-----------+----------+--------------+ SFJ      Full                                                        +---------+---------------+---------+-----------+----------+--------------+ FV Prox  Full                                                        +---------+---------------+---------+-----------+----------+--------------+ FV Mid   Full                                                        +---------+---------------+---------+-----------+----------+--------------+ FV DistalFull                                                        +---------+---------------+---------+-----------+----------+--------------+ PFV      Full                                                         +---------+---------------+---------+-----------+----------+--------------+ POP      Full           Yes      Yes                                 +---------+---------------+---------+-----------+----------+--------------+ PTV      Full                                                        +---------+---------------+---------+-----------+----------+--------------+ PERO     Full                                                        +---------+---------------+---------+-----------+----------+--------------+   +---------+---------------+---------+-----------+----------+--------------+  LEFT     CompressibilityPhasicitySpontaneityPropertiesThrombus Aging +---------+---------------+---------+-----------+----------+--------------+ CFV      Full           Yes      Yes                                 +---------+---------------+---------+-----------+----------+--------------+ SFJ      Full                                                        +---------+---------------+---------+-----------+----------+--------------+ FV Prox  Full                                                        +---------+---------------+---------+-----------+----------+--------------+ FV Mid   Full                                                        +---------+---------------+---------+-----------+----------+--------------+ FV DistalFull                                                        +---------+---------------+---------+-----------+----------+--------------+ PFV      Full                                                        +---------+---------------+---------+-----------+----------+--------------+ POP      Full           Yes      Yes                                 +---------+---------------+---------+-----------+----------+--------------+ PTV      Partial                                      Acute           +---------+---------------+---------+-----------+----------+--------------+ PERO     Full                                                        +---------+---------------+---------+-----------+----------+--------------+     Summary: RIGHT: - There is no evidence of deep vein thrombosis in the lower extremity.  - No cystic structure found in the popliteal fossa.  LEFT: - Findings consistent with acute deep vein thrombosis involving the left posterior tibial veins. - No cystic structure found in the popliteal fossa.  *  See table(s) above for measurements and observations. Electronically signed by Ruta Hinds MD on 01/30/2020 at 7:04:03 PM.    Final     Labs:  CBC: Recent Labs    01/29/20 0354 01/29/20 0354 01/30/20 4098 01/30/20 0438 01/30/20 1200 01/31/20 0550 01/31/20 0559 02/01/20 0349  WBC 15.6*  --  13.0*  --   --  11.4*  --  10.6*  HGB 11.1*   < > 10.2*   < > 9.2* 9.6* 10.2* 9.9*  HCT 33.6*   < > 31.1*   < > 27.0* 29.2* 30.0* 30.8*  PLT 184  --  165  --   --  182  --  199   < > = values in this interval not displayed.    COAGS: Recent Labs    01/27/20 0524 01/27/20 2048 01/28/20 0625 01/30/20 0955  INR 1.0 1.5* 1.1 1.1  APTT 76* 33 30  --     BMP: Recent Labs    01/29/20 0354 01/29/20 0354 01/30/20 0438 01/30/20 0438 01/30/20 1200 01/31/20 0550 01/31/20 0559 02/01/20 0349  NA 138   < > 137   < > 138 140 139 142  K 4.3   < > 3.8   < > 3.9 3.7 3.5 3.6  CL 109  --  101  --   --  106  --  109  CO2 22  --  28  --   --  25  --  23  GLUCOSE 129*  --  112*  --   --  120*  --  124*  BUN 14  --  19  --   --  14  --  12  CALCIUM 9.8  --  9.6  --   --  9.5  --  9.7  CREATININE 1.00  --  1.19*  --   --  0.93  --  0.76  GFRNONAA NOT CALCULATED  --  44*  --   --  60*  --  >60  GFRAA NOT CALCULATED  --  51*  --   --  >60  --  >60   < > = values in this interval not displayed.     Assessment and Plan:  History of acute CVA s/p cerebral arteriogram with  emergent mechanical thrombectomy of left MCA M1 occlusion achieving a TICI 3 revascularization 01/30/2020 by Dr. Karenann Cai. Patient's condition slightly improved- still remains intubated without sedation but more alert today, opens eyes to name, follows simple commands, can wiggle bilateral toes, can spontaneously move LUE, and demonstrates minimal flicker of RUE. Right groin incision stable, distal pulses 1+ bilaterally. Further plans per TCTS/neurology/CCM- appreciate and agree with management. NIR to follow.   Electronically Signed: Earley Abide, PA-C 02/01/2020, 9:45 AM   I spent a total of 25 Minutes at the the patient's bedside AND on the patient's hospital floor or unit, greater than 50% of which was counseling/coordinating care for left MCA M1 occlusion s/p revascularization.

## 2020-02-01 NOTE — Progress Notes (Signed)
ANTICOAGULATION CONSULT NOTE - [place] for Heparin Indication: pulmonary embolus, stroke and DVT, and recent CABG  Allergies  Allergen Reactions  . Bee Venom Swelling    Massive swelling  . Penicillins Other (See Comments)    UNSPECIFIED REACTION  Did it involve swelling of the face/tongue/throat, SOB, or low BP? Unknown Did it involve sudden or severe rash/hives, skin peeling, or any reaction on the inside of your mouth or nose? Unknown Did you need to seek medical attention at a hospital or doctor's office? Unknown When did it last happen?1945 If all above answers are "NO", may proceed with cephalosporin use.  . Adhesive [Tape] Rash  . Morphine And Related Rash  . Sulfa Antibiotics Nausea And Vomiting and Rash    Patient Measurements: Height: 5\' 3"  (160 cm) Weight: 152 lb 5.4 oz (69.1 kg) IBW/kg (Calculated) : 52.4 Heparin Dosing Weight: 65.3 kg  Vital Signs: Temp: 98.2 F (36.8 C) (03/17 0300) Temp Source: Oral (03/17 0300) BP: 153/64 (03/17 0600) Pulse Rate: 88 (03/17 0600)  Labs: Recent Labs    01/30/20 0438 01/30/20 0955 01/30/20 1200 01/30/20 1746 01/31/20 0550 01/31/20 0550 01/31/20 0559 01/31/20 0626 01/31/20 1355 02/01/20 0349  HGB 10.2*  --    < >  --  9.6*   < > 10.2*  --   --  9.9*  HCT 31.1*  --    < >  --  29.2*  --  30.0*  --   --  30.8*  PLT 165  --   --   --  182  --   --   --   --  199  LABPROT  --  14.1  --   --   --   --   --   --   --   --   INR  --  1.1  --   --   --   --   --   --   --   --   HEPARINUNFRC  --   --    < >   < >  --   --   --  0.36 0.32 0.36  CREATININE 1.19*  --   --   --  0.93  --   --   --   --  0.76   < > = values in this interval not displayed.    Estimated Creatinine Clearance: 55.8 mL/min (by C-G formula based on SCr of 0.76 mg/dL).   Medical History: Past Medical History:  Diagnosis Date  . Coronary artery disease   . Family history of adverse reaction to anesthesia    "  My Sister did "  . HTN (hypertension)   . Hyperlipidemia   . Hypothyroidism   . Osteoporosis   . Thyroid carcinoma (HCC)     Medications:  Plavix is being held as changing to full anticoagulation with IV Heparin  Assessment: 79 year old female admitted with angina on 3/11 and s/p CABG on 3/12 who had acute stroke symptoms on 3/15 AM. CTA of Head and Neck showed an acute MCA infarct and bilateral subsegmental acute pulmonary emboli. She is now s/p IR guided mechanical thrombectomy this AM and Pharmacy has been consulted to start IV Heparin therapy. 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 D-dimer was 1.25, fibrinogen 652. INR was normal at 1.1  Heparin level this morning is therapeutic at 0.36, on 750 units/hr. Hgb stable at 9.9, plt 199. No s/sx of bleeding or infusion issues. 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   Goal of Therapy:  Heparin level 0.3 to 0.5 units/ml Monitor platelets by anticoagulation protocol: Yes   Plan:  -Continue heparin infusion at 750 units/hr (7.5 ml/hr) -Will continue to monitor for any signs/symptoms of bleeding and will follow up with heparin level daily given remains therapeutic   Thank you for allowing pharmacy to be a part of this patient's care.  Antonietta Jewel, PharmD, BCCCP Clinical Pharmacist  Phone: [phone]  Please check AMION for all [place] phone numbers After 10:00 PM, call [place] [phone] 02/01/2020 7:25 AM

## 2020-02-01 NOTE — Progress Notes (Signed)
NAME:  [name] [name], MRN:  [id number], DOB:  [birth year], LOS: 6 ADMISSION DATE:  01/26/2020, CONSULTATION DATE:  01/30/2020 REFERRING MD:  Rory Percy CHIEF COMPLAINT:  Right hemiplegia   Brief History        This 79 year old was admitted on 3/8 for evaluation of chest pain.  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  She was up ambulating today and suddenly developed a right hemiplegia.  CTA showed an M1 occlusion and thrombectomy was performed.  An attempt was made to extubate the patient post procedure but this was not tolerated.  Of note at the time the CTA was performed she was also found to have pulmonary emboli.  History of present illness       History is as noted above with the following additions.  Intraoperative TEE showed essentially normal LV systolic dysfunction there was some LVH and a PFO with a left to right flow.  There is no thrombus noted in the left atrial appendage.  Past Medical History  Thyroid cancer, hypertension, hyperlipidemia.  Significant Hospital Events   Cardiac cath 3/12, CABG 3/13  Consults:  Thoracic surgery, neurology, interventional radiology.  Procedures:  Four-vessel CABG, left M1 thrombectomy  Significant Diagnostic Tests:  3/15 LE venous doppler:  RIGHT:  - There is no evidence of deep vein thrombosis in the lower extremity.    - No cystic structure found in the popliteal fossa.    LEFT:  - Findings consistent with acute deep vein thrombosis involving the left  posterior tibial veins.  - No cystic structure found in the popliteal fossa.  3/15 echo: LVEF [id number], grade 1 diastolic dysfunction, LA dilation, RVSP mildly elevated 3/16 cth: No acute intracranial hemorrhage. Likely evolving recent infarction of the lentiform nucleus. 3/15 cta head/neck 1. Emergent large vessel occlusion at the left M1 segment. 2. Aspects of 10 with CT perfusion suggesting core infarct of 74 cc  and 85 cc of penumbra. 3. Flow reducing left ICA bulb stenosis. 4. Bilateral subsegmental acute pulmonary emboli. 5. Proximal occlusion of the non dominant left vertebral artery with reconstitution at the dura. 3/16 eeg: This study showed evidence of epileptogenicity arising from left frontotemporal as well as cortical dysfunction in left hemisphere likely secondary to underlying stroke. No seizures were seen throughout the recording.  Micro Data:  3/8 sars2: neg  Antimicrobials:  None   Interim history/subjective:  3/17: sz yesterday placed on keppra. RN reports wakes up and weakly follows commands but unable to illicit any response on my exam.  3/16: She remains intubated and mechanically ventilated this morning.  She is on no sedation at the time of my exam. Objective   Blood pressure (!) 153/64, pulse 88, temperature 98.2 F (36.8 C), temperature source Oral, resp. rate 18, height 5\' 3"  (1.6 m), weight 69.1 kg, SpO2 100 %.    Vent Mode: PRVC FiO2 (%):  [40 %] 40 % Set Rate:  [15 bmp] 15 bmp Vt Set:  [450 mL] 450 mL PEEP:  [5 cmH20] 5 cmH20 Pressure Support:  [10 cmH20] 10 cmH20 Plateau Pressure:  [16 cmH20-19 cmH20] 16 cmH20   Intake/Output Summary (Last 24 hours) at 02/01/2020 0801 Last data filed at 02/01/2020 0700 Gross per 24 hour  Intake 1403.38 ml  Output 2030 ml  Net -626.62 ml   Filed Weights   01/30/20 0600 01/31/20 0500 02/01/20 0500  Weight: 70.8 kg 70.2 kg 69.1 kg    Examination:  General: She is orally intubated and mechanically ventilated.  She is on no sedation.   Minimally responsive, not following commands HENT: Unremarkable Lungs: Respirations are unlabored on a pressure support of 10.  There is symmetric air movement, no wheezes.  Chest tubes remain in place 135ml out this am with repositioning Cardiovascular: S1 and S2 are regular without murmur rub or gallop.  Pacer wires in on back up rate Abdomen: The abdomen is soft without any organomegaly  masses tenderness guarding or rebound.   Extremities: 1-2+ symmetric lower extremity edema  Neuro: Very lethargic despite being on no sedation.  She does not respond to voice.  She eye opens and lethargically and intermittently nods after being awakened with noxious stimuli.  Pupils are equal she has minimal movement of both upper extremities.   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   -eeg with focus noted and started on keppra Seizure: keppra Per neuro  Acute Pulmonary emboli:  -As noted this is distinctly unusual in a post bypass patient.   -Hep ab negative, anti jo, lupus, negative -remains on a/c Acute hypoxic Respiratory failure.   -She is actually easy to ventilate and oxygenate unfortunately her mental status precludes extubation.   R Pleural effusion:  -still present despite chest tube in place.  -will do bedside u/s but would hold on drainage if needed until off positive pressure as this effusion is not inhibiting vent or oxygenation or even sbt (mental status is).   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 HTN:  On cardene infusion, titrate to SBP<160 -will add PO home lisinopril and bb  Acute L LE DVT:  -on a/c   Hypothyroid:  -levothyroxine    Best practice:  Diet: start TF Pain/Anxiety/Delirium protocol (if indicated): avoid as able VAP protocol (if indicated): per protocol DVT prophylaxis: Fully anticoagulated  GI prophylaxis: Protonix  Glucose control: monitoring Mobility: Bedrest  code Status: Full Family Communication: Unfortunately appears that we do not have a surrogate for this patient Disposition: ICU  Labs   CBC: Recent Labs  Lab 01/28/20 1829 01/28/20 1829 01/29/20 0354 01/29/20 0354 01/30/20 0438 01/30/20 1200 01/31/20 0550 01/31/20 0559 02/01/20 0349  WBC 12.3*  --  15.6*  --  13.0*  --   11.4*  --  10.6*  NEUTROABS  --   --   --   --   --   --  9.2*  --   --   HGB 11.3*   < > 11.1*   < > 10.2* 9.2* 9.6* 10.2* 9.9*  HCT 33.2*   < > 33.6*   < > 31.1* 27.0* 29.2* 30.0* 30.8*  MCV 88.8  --  90.8  --  92.0  --  91.5  --  91.7  PLT 158  --  184  --  165  --  182  --  199   < > = values in this interval not displayed.    Basic Metabolic Panel: Recent Labs  Lab 01/28/20 0625 01/28/20 1244 01/28/20 1829 01/28/20 1829 01/29/20 0354 01/29/20 0354 01/30/20 0438 01/30/20 1200 01/31/20 0550 01/31/20 0559 02/01/20 0349  NA 142   < > 138   < > 138   < > 137 138 140 139 142  K 3.4*   < > 3.8   < > 4.3   < >  3.8 3.9 3.7 3.5 3.6  CL 110  --  110  --  109  --  101  --  106  --  109  CO2 24  --  21*  --  22  --  28  --  25  --  23  GLUCOSE 159*  --  124*  --  129*  --  112*  --  120*  --  124*  BUN 12  --  13  --  14  --  19  --  14  --  12  CREATININE 0.80  --  0.89  --  1.00  --  1.19*  --  0.93  --  0.76  CALCIUM 8.8*  --  9.6  --  9.8  --  9.6  --  9.5  --  9.7  MG 2.9*  --  2.4  --   --   --   --   --   --   --   --    < > = values in this interval not displayed.   GFR: Estimated Creatinine Clearance: 55.8 mL/min (by C-G formula based on SCr of 0.76 mg/dL). Recent Labs  Lab 01/29/20 0354 01/30/20 0438 01/31/20 0550 02/01/20 0349  WBC 15.6* 13.0* 11.4* 10.6*    Liver Function Tests: No results for input(s): AST, ALT, ALKPHOS, BILITOT, PROT, ALBUMIN in the last 168 hours. No results for input(s): LIPASE, AMYLASE in the last 168 hours. No results for input(s): AMMONIA in the last 168 hours.  ABG    Component Value Date/Time   PHART 7.429 01/31/2020 0559   PCO2ART 41.4 01/31/2020 0559   PO2ART 78.0 (L) 01/31/2020 0559   HCO3 27.4 01/31/2020 0559   TCO2 29 01/31/2020 0559   ACIDBASEDEF 2.0 01/28/2020 1545   O2SAT 96.0 01/31/2020 0559     Coagulation Profile: Recent Labs  Lab 01/27/20 0524 01/27/20 2048 01/28/20 0625 01/30/20 0955  INR 1.0 1.5* 1.1 1.1     Cardiac Enzymes: No results for input(s): CKTOTAL, CKMB, CKMBINDEX, TROPONINI in the last 168 hours.  HbA1C: Hgb A1c MFr Bld  Date/Time Value Ref Range Status  01/27/2020 05:24 AM 5.1 4.8 - 5.6 % Final    Comment:    (NOTE) Pre diabetes:          5.7%-6.4% Diabetes:              >6.4% Glycemic control for   <7.0% adults with diabetes     CBG: Recent Labs  Lab 01/31/20 1133 01/31/20 1552 01/31/20 1945 01/31/20 2319 02/01/20 0354  GLUCAP 116* 120* 128* 122* 119*     Critical care time: The patient is critically ill with multiple organ systems failure and requires high complexity decision making for assessment and support, frequent evaluation and titration of therapies, application of advanced monitoring technologies and extensive interpretation of multiple databases.  Critical care time 37 mins. This represents my time independent of the NPs time taking care of the pt. This is excluding procedures.    [facility] 02/01/2020, 8:01 AM

## 2020-02-01 NOTE — Plan of Care (Signed)
  Problem: Activity: Goal: Risk for activity intolerance will decrease Outcome: Not Progressing   Problem: Cardiac: Goal: Will achieve and/or maintain hemodynamic stability Outcome: Not Progressing   Problem: Clinical Measurements: Goal: Postoperative complications will be avoided or minimized Outcome: Not Progressing   Problem: Respiratory: Goal: Respiratory status will improve Outcome: Progressing   Problem: Skin Integrity: Goal: Wound healing without signs and symptoms of infection Outcome: Progressing   Problem: Urinary Elimination: Goal: Ability to achieve and maintain adequate renal perfusion and functioning will improve Outcome: Progressing

## 2020-02-01 NOTE — Progress Notes (Signed)
Patient ID: [name] [name], female   DOB: June 25, 1941, 79 y.o.   MRN: [id number] TCTS Evening Rounds:  Hypertensive today and started on Cardene drip but now BP on low side so weaning it off. Aim for MAP >70 with recent CNS event.  Remains intubated. sats 100%  Urine output ok.  CT output low.  Extubation plans per CCM.

## 2020-02-01 NOTE — Procedures (Signed)
Cortrak  Person Inserting Tube:  Esaw Dace, RD Tube Type:  Cortrak - 43 inches Tube Location:  Right nare Initial Placement:  Stomach Secured by: Bridle Technique Used to Measure Tube Placement:  Documented cm marking at nare/ corner of mouth Cortrak Secured At:  70 cm    Cortrak Tube Team Note:  Consult received to place a Cortrak feeding tube. Sucessfull tube placement.   Pt responsive during feeding tube placement. Pt even grabbed Cortrak tube and pulled out initial insertion. RN assisted with Cortrak placement; pt awake, following commands, wiggling toes and squeezing hands upon command. RN to notify MD   No x-ray is required. RN may begin using tube.   If the tube becomes dislodged please keep the tube and contact the Cortrak team at [email or web address] (password [email or web address]) for replacement.  If after hours and replacement cannot be delayed, place a NG tube and confirm placement with an abdominal x-ray.    Kerman Passey MS, RDN, LDN, CNSC RD Pager Number and Weekend/On-Call After Hours Pager Located in [place]

## 2020-02-01 NOTE — Progress Notes (Signed)
SLP Cancellation Note  Patient Details Name: BRIARROSE SEGALE MRN: [id number] DOB: 1941/08/05   Cancelled treatment:       Reason Eval/Treat Not Completed: Patient not medically ready(Pt remains on vent at this time. SLP will follow up. )  Westly Hinnant I. Hardin Negus, [place], [place] Office number [phone] Pager [place] 02/01/2020, 8:18 AM

## 2020-02-01 NOTE — Progress Notes (Signed)
STROKE TEAM PROGRESS NOTE   INTERVAL HISTORY Pt doing well, no family at bedside. She is still intubated but open eyes on voice and following simple commands. Mild right hemiparesis comparing with left. Plan to extubate soon as per RN. Off cardene. Has cortrak.    Vitals:   02/01/20 1130 02/01/20 1145 02/01/20 1200 02/01/20 1218  BP: (!) 127/58  (!) 127/58   Pulse: 70 70 70   Resp: 15 17 19    Temp:    98.8 F (37.1 C)  TempSrc:    Oral  SpO2: 100% 100% 100%   Weight:      Height:        CBC:  Recent Labs  Lab 01/31/20 0550 01/31/20 0550 01/31/20 0559 02/01/20 0349  WBC 11.4*  --   --  10.6*  NEUTROABS 9.2*  --   --   --   HGB 9.6*   < > 10.2* 9.9*  HCT 29.2*   < > 30.0* 30.8*  MCV 91.5  --   --  91.7  PLT 182  --   --  199   < > = values in this interval not displayed.    Basic Metabolic Panel:  Recent Labs  Lab 01/28/20 1829 01/29/20 0354 01/31/20 0550 01/31/20 0550 01/31/20 0559 02/01/20 0349 02/01/20 1021  NA 138   < > 140   < > 139 142  --   K 3.8   < > 3.7   < > 3.5 3.6  --   CL 110   < > 106  --   --  109  --   CO2 21*   < > 25  --   --  23  --   GLUCOSE 124*   < > 120*  --   --  124*  --   BUN 13   < > 14  --   --  12  --   CREATININE 0.89   < > 0.93  --   --  0.76  --   CALCIUM 9.6   < > 9.5  --   --  9.7  --   MG 2.4  --   --   --   --   --  2.0  PHOS  --   --   --   --   --   --  2.0*   < > = values in this interval not displayed.   Lipid Panel:     Component Value Date/Time   CHOL 81 01/31/2020 0550   TRIG 90 01/31/2020 0550   HDL 35 (L) 01/31/2020 0550   CHOLHDL 2.3 01/31/2020 0550   VLDL 18 01/31/2020 0550   LDLCALC 28 01/31/2020 0550   HgbA1c:  Lab Results  Component Value Date   HGBA1C 5.1 01/27/2020   Urine Drug Screen: No results found for: LABOPIA, COCAINSCRNUR, LABBENZ, AMPHETMU, THCU, LABBARB  Alcohol Level No results found for: ETH  IMAGING past 24 hours DG CHEST PORT 1 VIEW  Result Date: 02/01/2020 CLINICAL DATA:   Intubated patient status post left frontal and basal ganglia infarct. EXAM: PORTABLE CHEST 1 VIEW COMPARISON:  Single-view of the chest 01/30/2020 and 01/29/2020. FINDINGS: Endotracheal tube remains in place with the tip 1.6 cm above the carina. Right IJ central venous catheters, OG tube, mediastinal drain and bilateral chest tubes are again seen. Small to moderate pleural effusions are present, greater on the right, with associated basilar atelectasis. No pneumothorax. Heart size is upper normal. Atherosclerosis noted.  IMPRESSION: Support tubes and lines project in good position.  No pneumothorax. No notable change in a small to moderate right pleural effusion and basilar atelectasis. Very small left pleural effusion noted. Electronically Signed   By: Inge Rise M.D.   On: 02/01/2020 09:50    PHYSICAL EXAM    Temp:  [97.7 F (36.5 C)-98.8 F (37.1 C)] 98.8 F (37.1 C) (03/17 1218) Pulse Rate:  [70-95] 70 (03/17 1200) Resp:  [14-27] 19 (03/17 1200) BP: (119-160)/(49-72) 127/58 (03/17 1200) SpO2:  [79 %-100 %] 100 % (03/17 1200) Arterial Line BP: (132-190)/(45-66) 157/48 (03/17 1200) FiO2 (%):  [40 %] 40 % (03/17 1101) Weight:  [69.1 kg] 69.1 kg (03/17 0500)  General - Well nourished, well developed, intubated off sedation.  Ophthalmologic - fundi not visualized due to noncooperation.  Cardiovascular - Regular rate and rhythm.  Neuro - intubated off sedation, eyes closed, eailiy open with voice and following simple commands. With eye opening, eyes in mid position, able to have bilateral gaze, blinking to visual threat bilaterally, tracking bilaterally, PERRL. Corneal reflex present, gag and cough present. Breathing over the vent.  Facial symmetry not able to test due to ET tube.  Tongue protrusion not cooperative. BUE in restrain, but at least 3/5 proximal and distal. BLE 3/5 proximal and left distal 4/5 and RLE 3/5 distally. DTR 1+ and bilateral neg babinski. Sensation, coordination not  cooperative and gait not tested.   ASSESSMENT/PLAN Ms. [name] [name] is a 79 y.o. female with history of coronary artery disease, hypertension, hyperlipidemia, thyroid cancer and possible bilateral carotid stenosis, admitted for treatment of symptomatic coronary artery disease and is 2 days postop from her three-vessel CABG, who while up walking with nurse became weak and developed L gaze, R sided weakness and aphasia. CT with dense L MCA. CTA w/ L M1 occlusion. Taken to IR.   Stroke: L MCA frontal and BG infarct with left M1 occlusion s/p IR w/ TICI3 revascularization, etiology could be paradoxical emboli in the setting of PE/DVT and positive PFO vs. secondary to large vessel disease source  Acute left gaze, right side weakness  Code Stroke CT head dense L MCA. ASPECTS 10.     CTA head & neck L M1 occlusion. Proximal L VA occlusion. L ICA bulb stenosis. B subsegmental PE.  CT perfusion 74cc core, 85 penumbra  Cerebral angio/IR proximal L M1 occlusion s/p mechanical thrombectomy w/TICI3 recanalization.  Post IR CT no hemorrhage  Not able to perform MRI due to external pacing wire  CT head 3/16 am no hemorrhage. Small anterior L frontal lobe infarct. ? L lentiform nucleus infarct.  CT head 3/16 noon no ICH. L lentiform nucleus evolving infarct   LE Doppler  L posterior tib DVT   2D Echo EF 30-35%. No source of embolus   TEE (intraop 3/12) + PFO  LDL 28  HgbA1c 5.1  Heparin IV for VTE prophylaxis  aspirin 81 mg daily prior to admission, now on aspirin 81 mg daily and clopidogrel 75 mg daily. On IV heparin per stroke protocol given PE and embolic stroke. Discussed with Dr. Orvan Seen, will hold off plavix and continue aspirin. Transition IV heparin to DOAC once appropriate.   Therapy recommendations:  pending   Disposition:  pending   B PE, DVT  Concern for acquired hemophilia vs. DVT from inactivity  HIT screen, lupus ac panel, DIC panel neg  on IV heparin  LE  venous doppler L posterior tib DVT   Transition to po  DOAC once appropriate  PFO  TEE showed PFO present at the supeior aspect of the fossa ovalis with continuous left to right flow.   LE venous doppler L posterior tib DVT   Concerning for paradoxical emboli  Acute Respiratory Failure Intubated for IR; unable to tolerate extubation post procedure  B CT post op  Mental status improved and plan for extubation today or tomorrow  CCM on board  Seizure-like activity  Jerking/tremoring movements R arm and shoulder 01/31/2020  Resolved w/ ativan  Put on Keppra 750 q 12h  EEG L frontotemporal sharp waves at T7, slow on L - d/t stroke  Continue Keppra  Coronary artery disease s/p 3v CABG 3/13 Volume Overload Acute Blood Loss Anemia  External pacer   Lasix  Hgb 9.9  Management per cardiology  On ASA and heparin IV now  Carotid stenosis  Chronic as per hx  CTA neck showed left ICA bulb tandem high grade stenosis and right ICA atherosclerosis.   outpt follow up with [facility]  Hypertension  Stable so far  Off cardene . Gradually normalize BP goal to normotensive . BP goal 120-180 . Long-term BP goal normotensive  Hyperlipidemia  Home meds:  lipitor 80, resumed in hospital  LDL 28, goal < 70  Continue statin at discharge  Dysphagia . NPO for now . Cortrack placed  . SLP following . On tube feeding   Other Stroke Risk Factors  Advanced age  Overweight, Body mass index is 26.99 kg/m., recommend weight loss, diet and exercise as appropriate   Combined CHF   Other Active Problems  AKI s/p dye load, resolved. 0.76 Monitor Cre  Hx Thyroid cancer  Hypothyroidism on levothryroxine  Hospital day # 6  Neurology will sign off. Please call with questions. Pt will follow up with stroke clinic NP at [facility] in about 4 weeks. Thanks for the consult.   Rosalin Hawking, MD PhD Stroke Neurology 02/01/2020 1:41 PM   To contact Stroke Continuity provider,  please refer to [email or web address] After hours, contact General Neurology

## 2020-02-01 NOTE — Progress Notes (Signed)
OT Cancellation Note  Patient Details Name: [name] [name] MRN: [id number] DOB: [birth year]   Cancelled Treatment:    Reason Eval/Treat Not Completed: Medical issues which prohibited therapy(Pt intubated and following few commands)  OTR to continue to follow for next available treatment time as pt intubated and may get extubated this PM.   [name] [name], [name] [name] [facility] Pager: [phone] Office: [phone]   [name] [name] 02/01/2020, 2:06 PM

## 2020-02-02 ENCOUNTER — Inpatient Hospital Stay (HOSPITAL_COMMUNITY): Payer: PPO

## 2020-02-02 ENCOUNTER — Inpatient Hospital Stay: Payer: Self-pay

## 2020-02-02 DIAGNOSIS — Z789 Other specified health status: Secondary | ICD-10-CM

## 2020-02-02 DIAGNOSIS — T884XXA Failed or difficult intubation, initial encounter: Secondary | ICD-10-CM

## 2020-02-02 LAB — BASIC METABOLIC PANEL
Anion gap: 8 (ref 5–15)
BUN: 15 mg/dL (ref 8–23)
CO2: 25 mmol/L (ref 22–32)
Calcium: 9.5 mg/dL (ref 8.9–10.3)
Chloride: 111 mmol/L (ref 98–111)
Creatinine, Ser: 0.65 mg/dL (ref 0.44–1.00)
GFR calc Af Amer: >60 mL/min (ref >60)
GFR calc non Af Amer: >60 mL/min (ref >60)
Glucose, Bld: 136 mg/dL — ABNORMAL HIGH (ref 70–99)
Potassium: 3.4 mmol/L — ABNORMAL LOW (ref 3.5–5.1)
Sodium: 144 mmol/L (ref 135–145)

## 2020-02-02 LAB — POCT I-STAT 7, (LYTES, BLD GAS, ICA,H+H)
Acid-Base Excess: 4 mmol/L — ABNORMAL HIGH (ref 0.0–2.0)
Bicarbonate: 28.5 mmol/L — ABNORMAL HIGH (ref 20.0–28.0)
Calcium, Ion: 1.48 mmol/L — ABNORMAL HIGH (ref 1.15–1.40)
HCT: 28 % — ABNORMAL LOW (ref 36.0–46.0)
Hemoglobin: 9.5 g/dL — ABNORMAL LOW (ref 12.0–15.0)
O2 Saturation: 100 %
Potassium: 3.9 mmol/L (ref 3.5–5.1)
Sodium: 143 mmol/L (ref 135–145)
TCO2: 30 mmol/L (ref 22–32)
pCO2 arterial: 43.3 mmHg (ref 32.0–48.0)
pH, Arterial: 7.427 (ref 7.350–7.450)
pO2, Arterial: 301 mmHg — ABNORMAL HIGH (ref 83.0–108.0)

## 2020-02-02 LAB — CBC
HCT: 30.3 % — ABNORMAL LOW (ref 36.0–46.0)
Hemoglobin: 9.8 g/dL — ABNORMAL LOW (ref 12.0–15.0)
MCH: 29.7 pg (ref 26.0–34.0)
MCHC: 32.3 g/dL (ref 30.0–36.0)
MCV: 91.8 fL (ref 80.0–100.0)
Platelets: 213 10*3/uL (ref 150–400)
RBC: 3.3 MIL/uL — ABNORMAL LOW (ref 3.87–5.11)
RDW: 14.6 % (ref 11.5–15.5)
WBC: 12.9 10*3/uL — ABNORMAL HIGH (ref 4.0–10.5)
nRBC: 0 % (ref 0.0–0.2)

## 2020-02-02 LAB — MAGNESIUM
Magnesium: 1.7 mg/dL (ref 1.7–2.4)
Magnesium: 1.7 mg/dL (ref 1.7–2.4)

## 2020-02-02 LAB — GLUCOSE, CAPILLARY
Glucose-Capillary: 120 mg/dL — ABNORMAL HIGH (ref 70–99)
Glucose-Capillary: 125 mg/dL — ABNORMAL HIGH (ref 70–99)
Glucose-Capillary: 118 mg/dL — ABNORMAL HIGH (ref 70–99)
Glucose-Capillary: 114 mg/dL — ABNORMAL HIGH (ref 70–99)
Glucose-Capillary: 119 mg/dL — ABNORMAL HIGH (ref 70–99)
Glucose-Capillary: 176 mg/dL — ABNORMAL HIGH (ref 70–99)

## 2020-02-02 LAB — HEPARIN LEVEL (UNFRACTIONATED)
Heparin Unfractionated: 0.26 IU/mL — ABNORMAL LOW (ref 0.30–0.70)
Heparin Unfractionated: 0.34 IU/mL (ref 0.30–0.70)

## 2020-02-02 LAB — PHOSPHORUS
Phosphorus: 2 mg/dL — ABNORMAL LOW (ref 2.5–4.6)
Phosphorus: 2.7 mg/dL (ref 2.5–4.6)

## 2020-02-02 MED ORDER — ORAL CARE MOUTH RINSE
15.0000 mL | OROMUCOSAL | Status: DC
  Administered 2020-02-02 – 2020-02-20 (×110): 15 mL via OROMUCOSAL

## 2020-02-02 MED ORDER — ORAL CARE MOUTH RINSE
15.0000 mL | Freq: Two times a day (BID) | OROMUCOSAL | Status: DC
  Administered 2020-02-02: 15 mL via OROMUCOSAL

## 2020-02-02 MED ORDER — RACEPINEPHRINE HCL 2.25 % IN NEBU
INHALATION_SOLUTION | RESPIRATORY_TRACT | Status: AC
  Filled 2020-02-02: qty 0.5

## 2020-02-02 MED ORDER — RACEPINEPHRINE HCL 2.25 % IN NEBU
0.5000 mL | INHALATION_SOLUTION | Freq: Once | RESPIRATORY_TRACT | Status: AC
  Administered 2020-02-02: 0.5 mL via RESPIRATORY_TRACT
  Filled 2020-02-02 (×2): qty 0.5

## 2020-02-02 MED ORDER — CHLORHEXIDINE GLUCONATE 0.12% ORAL RINSE (MEDLINE KIT)
15.0000 mL | Freq: Two times a day (BID) | OROMUCOSAL | Status: DC
  Administered 2020-02-02 – 2020-02-18 (×28): 15 mL via OROMUCOSAL

## 2020-02-02 MED ORDER — DEXAMETHASONE SODIUM PHOSPHATE 10 MG/ML IJ SOLN
10.0000 mg | Freq: Once | INTRAMUSCULAR | Status: AC
  Administered 2020-02-02: 10 mg via INTRAVENOUS
  Filled 2020-02-02: qty 1

## 2020-02-02 MED ORDER — DEXAMETHASONE SODIUM PHOSPHATE 10 MG/ML IJ SOLN
10.0000 mg | Freq: Four times a day (QID) | INTRAMUSCULAR | Status: AC
  Administered 2020-02-02 – 2020-02-03 (×4): 10 mg via INTRAVENOUS
  Filled 2020-02-02 (×4): qty 1

## 2020-02-02 MED ORDER — LISINOPRIL 20 MG PO TABS
20.0000 mg | ORAL_TABLET | Freq: Once | ORAL | Status: AC
  Administered 2020-02-02: 20 mg
  Filled 2020-02-02: qty 1

## 2020-02-02 MED ORDER — MIDAZOLAM HCL 2 MG/2ML IJ SOLN
INTRAMUSCULAR | Status: AC
  Administered 2020-02-02: 2 mg
  Filled 2020-02-02: qty 2

## 2020-02-02 MED ORDER — SODIUM CHLORIDE 0.9% FLUSH
10.0000 mL | Freq: Two times a day (BID) | INTRAVENOUS | Status: DC
  Administered 2020-02-02: 10 mL
  Administered 2020-02-02: 20 mL
  Administered 2020-02-03 – 2020-02-10 (×14): 10 mL
  Administered 2020-02-11: 20 mL
  Administered 2020-02-12 – 2020-02-21 (×19): 10 mL

## 2020-02-02 MED ORDER — SODIUM CHLORIDE 0.9% FLUSH: 10.0000 mL | INTRAVENOUS | Status: DC | PRN

## 2020-02-02 MED ORDER — LISINOPRIL 40 MG PO TABS
40.0000 mg | ORAL_TABLET | Freq: Every day | ORAL | Status: DC
  Administered 2020-02-03 – 2020-02-17 (×15): 40 mg
  Filled 2020-02-02: qty 2
  Filled 2020-02-02: qty 1
  Filled 2020-02-02: qty 2
  Filled 2020-02-02: qty 1
  Filled 2020-02-02: qty 2
  Filled 2020-02-02 (×2): qty 1
  Filled 2020-02-02: qty 2
  Filled 2020-02-02 (×4): qty 1
  Filled 2020-02-02: qty 2
  Filled 2020-02-02: qty 1
  Filled 2020-02-02: qty 2

## 2020-02-02 MED ORDER — CHLORHEXIDINE GLUCONATE 0.12 % MT SOLN: 15.0000 mL | Freq: Two times a day (BID) | OROMUCOSAL | Status: DC

## 2020-02-02 MED ORDER — ETOMIDATE 2 MG/ML IV SOLN
INTRAVENOUS | Status: AC
  Administered 2020-02-02: 20 mg
  Filled 2020-02-02: qty 10

## 2020-02-02 MED ORDER — POTASSIUM CHLORIDE 10 MEQ/50ML IV SOLN
10.0000 meq | INTRAVENOUS | Status: AC
  Administered 2020-02-02 (×3): 10 meq via INTRAVENOUS
  Filled 2020-02-02 (×3): qty 50

## 2020-02-02 MED FILL — Heparin Sodium (Porcine) Inj 1000 Unit/ML: INTRAMUSCULAR | Qty: 10 | Status: AC

## 2020-02-02 MED FILL — Mannitol IV Soln 20%: INTRAVENOUS | Qty: 500 | Status: AC

## 2020-02-02 MED FILL — Lidocaine HCl Local Soln Prefilled Syringe 100 MG/5ML (2%): INTRAMUSCULAR | Qty: 5 | Status: AC

## 2020-02-02 MED FILL — Electrolyte-R (PH 7.4) Solution: INTRAVENOUS | Qty: 4000 | Status: AC

## 2020-02-02 MED FILL — Sodium Chloride IV Soln 0.9%: INTRAVENOUS | Qty: 2000 | Status: AC

## 2020-02-02 MED FILL — Sodium Bicarbonate IV Soln 8.4%: INTRAVENOUS | Qty: 50 | Status: AC

## 2020-02-02 NOTE — Progress Notes (Signed)
SLP Cancellation Note  Patient Details Name: [name] [name] MRN: [id number] DOB: April 19, 1941   Cancelled treatment:       Reason Eval/Treat Not Completed: Patient not medically ready(Pt remains intubated at this time. SLP will follow up. )  Retina Bernardy I. Hardin Negus, [place], [place] Office number [phone] Pager [place] 02/02/2020, 8:05 AM

## 2020-02-02 NOTE — Progress Notes (Signed)
ANTICOAGULATION CONSULT NOTE - [place] for Heparin Indication: pulmonary embolus, stroke and DVT, and recent CABG  Allergies  Allergen Reactions  . Bee Venom Swelling    Massive swelling  . Penicillins Other (See Comments)    UNSPECIFIED REACTION  Did it involve swelling of the face/tongue/throat, SOB, or low BP? Unknown Did it involve sudden or severe rash/hives, skin peeling, or any reaction on the inside of your mouth or nose? Unknown Did you need to seek medical attention at a hospital or doctor's office? Unknown When did it last happen?1945 If all above answers are "NO", may proceed with cephalosporin use.  . Adhesive [Tape] Rash  . Morphine And Related Rash  . Sulfa Antibiotics Nausea And Vomiting and Rash    Patient Measurements: Height: 5\' 3"  (160 cm) Weight: 151 lb 0.2 oz (68.5 kg) IBW/kg (Calculated) : 52.4 Heparin Dosing Weight: 65.3 kg  Vital Signs: Temp: 98.4 F (36.9 C) (03/18 1509) Temp Source: Oral (03/18 0817) BP: 142/60 (03/18 1330) Pulse Rate: 84 (03/18 1345)  Labs: Recent Labs    01/31/20 0550 01/31/20 0550 01/31/20 0559 01/31/20 0626 02/01/20 0349 02/02/20 0411 02/02/20 1317  HGB 9.6*   < > 10.2*   < > 9.9* 9.8*  --   HCT 29.2*   < > 30.0*  --  30.8* 30.3*  --   PLT 182  --   --   --  199 213  --   HEPARINUNFRC  --   --   --    < > 0.36 0.26* 0.34  CREATININE 0.93  --   --   --  0.76 0.65  --    < > = values in this interval not displayed.    Estimated Creatinine Clearance: 55.5 mL/min (by C-G formula based on SCr of 0.65 mg/dL).  Medications:  Plavix is being held as changing to full anticoagulation with IV Heparin  Assessment: 79 year old female admitted with angina on 3/11 and s/p CABG on 3/12 who had acute stroke symptoms on 3/15 AM. CTA of Head and Neck showed an acute MCA infarct and bilateral subsegmental acute pulmonary emboli. She is now s/p IR guided mechanical thrombectomy this AM and Pharmacy  has been consulted to start IV Heparin therapy. 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 D-dimer was 1.25, fibrinogen 652. INR was normal at 1.1  Heparin level 0.32 is therapeutic on heparin gtt at 850 units/hr. No issues with line or bleeding reported per RN.  Goal of Therapy:  Heparin level 0.3 to 0.5 units/ml Monitor platelets by anticoagulation protocol: Yes   Plan:  - Continue  heparin infusion to 850 units/hr -daily CBC,Bmet  Bonnita Nasuti Pharm.D. CPP, BCPS Clinical Pharmacist [phone] 02/02/2020 3:15 PM

## 2020-02-02 NOTE — Progress Notes (Addendum)
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 Karenann [name].  Subjective:  [name] [name] is now extubated. She is reclined in bed. More alert today and able to nod to answer questions.   Allergies: Bee venom, Penicillins, Adhesive [tape], Morphine and related, and Sulfa antibiotics  Medications: Prior to Admission medications   Medication Sig Start Date End Date Taking? Authorizing Provider  acetaminophen (TYLENOL) 500 MG tablet Take 1,000 mg by mouth in the morning and at bedtime.    Yes [provider]  aspirin EC 81 MG tablet Take 81 mg by mouth at bedtime.   Yes [provider]  atorvastatin (LIPITOR) 80 MG tablet Take 80 mg by mouth daily at 12 noon.    Yes [provider]  cholecalciferol (VITAMIN D) 1000 UNITS tablet Take 1,000 Units by mouth daily at 12 noon.    Yes [provider]  fluticasone (FLONASE) 50 MCG/ACT nasal spray Place 2 sprays into both nostrils daily. 12/19/19  Yes [provider]  levothyroxine (SYNTHROID, LEVOTHROID) 200 MCG tablet Take 100-200 mcg by mouth See admin instructions. Take 200 mcg  SUN-TUES-THUR-SAT Take 100 mcg tab MON-WED-FRI  DO NOT USE GENERIC   Yes [provider]  lisinopril (ZESTRIL) 20 MG tablet Take 20 mg by mouth daily.    Yes [provider]  metoprolol tartrate (LOPRESSOR) 25 MG tablet Take 1 tablet (25 mg total) by mouth 2 (two) times daily. Patient taking differently: Take 25 mg by mouth 2 (two) times daily after a meal.  01/04/20 04/03/20 Yes [name], [name] Freer, MD  nitroGLYCERIN (NITROSTAT) 0.4 MG SL tablet Place 1 tablet (0.4 mg total) under the tongue every 5 (five)  minutes as needed for chest pain. 11/22/19 02/20/20 Yes [name], [name] Freer, MD  Prenatal Vit-Fe Fumarate-FA (PRENATAL MULTIVITAMIN) TABS tablet Take 1 tablet by mouth daily at 12 noon.   Yes [provider]  vitamin B-12 (CYANOCOBALAMIN) 1000 MCG tablet Take 2,500 mcg by mouth daily.   Yes [provider]     Vital Signs: BP (!) 147/56   Pulse 84   Temp 98.6 F (37 C) (Oral)   Resp 16   Ht '5\' 3"'  (1.6 m)   Wt 68.5 kg   SpO2 100%   BMI 26.75 kg/m   Physical Exam Vitals reviewed.  Constitutional:      Comments: Extubated this morning.  Cardiovascular:     Rate and Rhythm: Normal rate.  Pulmonary:     Effort: Pulmonary effort is normal. No respiratory distress.  Abdominal:     Palpations: Abdomen is soft.  Musculoskeletal:     Comments: Right arm some movement, slowly improving. Wiggles toes, can raise right leg slightly. Right groin stable  Neurological:     Mental Status: She is alert.     Imaging: CT Code Stroke CTA Head W/WO contrast  Result Date: 01/30/2020 CLINICAL DATA:  Stroke symptoms EXAM: CT ANGIOGRAPHY HEAD AND NECK CT PERFUSION BRAIN TECHNIQUE: Multidetector CT imaging of the head and neck was performed using the standard protocol during bolus administration of intravenous contrast. Multiplanar CT image reconstructions and MIPs were obtained to evaluate the vascular anatomy. Carotid stenosis measurements (when applicable) are obtained utilizing NASCET criteria, using the  distal internal carotid diameter as the denominator. Multiphase CT imaging of the brain was performed following IV bolus contrast injection. Subsequent parametric perfusion maps were calculated using RAPID software. CONTRAST:  Dose is not available COMPARISON:  Head CT from earlier today FINDINGS: CTA NECK FINDINGS Aortic arch: Atherosclerosis.  Changes of recent cardiac surgery. Right carotid system: Atherosclerotic plaque primarily at the bifurcation with up to 40% stenosis Left  carotid system: Advanced atheromatous narrowing at the ICA bulb, flow reducing and measuring 80% on sagittal reformats. No dissection. Vertebral arteries: No proximal subclavian stenosis. The right vertebral artery is dominant and diffusely patent. Proximal left vertebral occlusion with reconstitution and robust flow by the dura. 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 Other neck: No acute finding. Upper chest: Bilateral upper lobe subsegmental pulmonary emboli with central branching acute appearance. Review of the MIP images confirms the above findings CTA HEAD FINDINGS Anterior circulation: Left M1 occlusion. Atherosclerotic calcification of both carotid siphons with moderate narrowing at the left paraclinoid segment. Posterior circulation: The right vertebral artery is dominant. Diffuse atheromatous plaque seen along the left V4 segment with patency to the basilar. The basilar is smooth and diffusely patent. No PCA branch occlusion or flow limiting stenosis. Mild right P2 segment narrowing. Venous sinuses: Unremarkable in the arterial phase Anatomic variants: None significant Review of the MIP images confirms the above findings CT Brain Perfusion Findings: ASPECTS: 10 CBF (<30%) Volume: 84m Perfusion (Tmax>6.0s) volume: 1518mMismatch Volume: 85 cc of penumbramL Critical Value/emergent results were called by telephone at the time of interpretation on 01/30/2020 at 7:01 am [facility] who verbally acknowledged these results. IMPRESSION: 1. Emergent large vessel occlusion at the left M1 segment. 2. Aspects of 10 with CT perfusion suggesting core infarct of 74 cc and 85 cc of penumbra. 3. Flow reducing left ICA bulb stenosis. 4. Bilateral subsegmental acute pulmonary emboli. 5. Proximal occlusion of the non dominant left vertebral artery with reconstitution at the dura. Electronically Signed   By: JoMonte Fantasia.D.   On: 01/30/2020 07:04   CT HEAD WO CONTRAST  Result  Date: 01/31/2020 CLINICAL DATA:  Post left M1 thrombectomy EXAM: CT HEAD WITHOUT CONTRAST TECHNIQUE: Contiguous axial images were obtained from the base of the skull through the vertex without intravenous contrast. COMPARISON:  Earlier same day FINDINGS: Brain: There is diminished visualization the left lentiform nucleus. Stable potential small area of cortical hypoattenuation in the left frontal lobe, which could reflect volume averaging. There is no acute intracranial hemorrhage or mass effect. Ventricles are stable in size. Vascular: No new findings. Skull: Calvarium is unremarkable. Sinuses/Orbits: No acute finding. Other: None. IMPRESSION: No acute intracranial hemorrhage. Likely evolving recent infarction of the lentiform nucleus. Electronically Signed   By: PrMacy Mis.D.   On: 01/31/2020 12:06   CT HEAD WO CONTRAST  Result Date: 01/31/2020 CLINICAL DATA:  Status post revascularization of left M1 occlusion. EXAM: CT HEAD WITHOUT CONTRAST TECHNIQUE: Contiguous axial images were obtained from the base of the skull through the vertex without intravenous contrast. COMPARISON:  CT head without contrast 01/30/2020 FINDINGS: Brain: Small nonhemorrhagic cortical infarct is present in the inferior left frontal gyrus on axial image 24 and coronal image 31. No other acute or focal cortical infarct is present. Precentral gyrus is intact. Mild white matter changes are again seen. The left lentiform nucleus is less well-defined than on the right. Insular cortex is intact. Caudate lobe is normal. Internal capsule is normal. No hemorrhage is  present. The ventricles are of normal size. No significant extraaxial fluid collection is present. The brainstem and cerebellum are within normal limits. Vascular: Atherosclerotic calcifications are present within the cavernous internal carotid arteries and at the dural margin of both vertebral arteries. No hyperdense vessel is present. Skull: Calvarium is intact. No focal lytic  or blastic lesions are present. No significant extracranial soft tissue lesion is present. Sinuses/Orbits: The paranasal sinuses and mastoid air cells are clear. The globes and orbits are within normal limits. IMPRESSION: 1. No significant hemorrhage following revascularization of left M1 occlusion. 2. Small cortical infarct in the anterior left frontal lobe. 3. Question ischemic infarct of the left lentiform nucleus, less well seen than on the right. 4. No other significant cortical or basal ganglia infarct. Electronically Signed   By: San Morelle M.D.   On: 01/31/2020 07:20   CT Code Stroke CTA Neck W/WO contrast  Result Date: 01/30/2020 CLINICAL DATA:  Stroke symptoms EXAM: CT ANGIOGRAPHY HEAD AND NECK CT PERFUSION BRAIN TECHNIQUE: Multidetector CT imaging of the head and neck was performed using the standard protocol during bolus administration of intravenous contrast. Multiplanar CT image reconstructions and MIPs were obtained to evaluate the vascular anatomy. Carotid stenosis measurements (when applicable) are obtained utilizing NASCET criteria, using the distal internal carotid diameter as the denominator. Multiphase CT imaging of the brain was performed following IV bolus contrast injection. Subsequent parametric perfusion maps were calculated using RAPID software. CONTRAST:  Dose is not available COMPARISON:  Head CT from earlier today FINDINGS: CTA NECK FINDINGS Aortic arch: Atherosclerosis.  Changes of recent cardiac surgery. Right carotid system: Atherosclerotic plaque primarily at the bifurcation with up to 40% stenosis Left carotid system: Advanced atheromatous narrowing at the ICA bulb, flow reducing and measuring 80% on sagittal reformats. No dissection. Vertebral arteries: No proximal subclavian stenosis. The right vertebral artery is dominant and diffusely patent. Proximal left vertebral occlusion with reconstitution and robust flow by the dura. Skeleton: Advanced degenerative disease  in the cervical spine. Other neck: No acute finding. Upper chest: Bilateral upper lobe subsegmental pulmonary emboli with central branching acute appearance. Review of the MIP images confirms the above findings CTA HEAD FINDINGS Anterior circulation: Left M1 occlusion. Atherosclerotic calcification of both carotid siphons with moderate narrowing at the left paraclinoid segment. Posterior circulation: The right vertebral artery is dominant. Diffuse atheromatous plaque seen along the left V4 segment with patency to the basilar. The basilar is smooth and diffusely patent. No PCA branch occlusion or flow limiting stenosis. Mild right P2 segment narrowing. Venous sinuses: Unremarkable in the arterial phase Anatomic variants: None significant Review of the MIP images confirms the above findings CT Brain Perfusion Findings: ASPECTS: 10 CBF (<30%) Volume: 2m Perfusion (Tmax>6.0s) volume: 1568mMismatch Volume: 85 cc of penumbramL Critical Value/emergent results were called by telephone at the time of interpretation on 01/30/2020 at 7:01 am [facility] these results. IMPRESSION: 1. Emergent large vessel occlusion at the left M1 segment. 2. Aspects of 10 with CT perfusion suggesting core infarct of 74 cc and 85 cc of penumbra. 3. Flow reducing left ICA bulb stenosis. 4. Bilateral subsegmental acute pulmonary emboli. 5. Proximal occlusion of the non dominant left vertebral artery with reconstitution at the dura. Electronically Signed   By: JoMonte Fantasia.D.   On: 01/30/2020 07:04   IR CT Head Ltd  Result Date: 01/30/2020 CLINICAL DATA:  [age]ear old female with past medical history of coronary artery disease, hypertension, hyperlipidemia and thyroid  cancer who was admitted for three-vessel CABG performed 2 days ago. She developed the sudden right-sided weakness. Last known well was 6 a.m. today. Baseline mRS is unknown (likely 0-1). No IV tPA given due to recent surgery. Head CT  showed no acute infarct or hemorrhage. CT angiogram showed a left ICA T occlusion extending to the MCA bifurcation with moderate to poor collaterals. CT perfusion showing possible core infarct of 74 mL with a mismatch core/penumbra of 85 mL. She was brought to our service for an emergency mechanical thrombectomy. EXAM: IR PERCUTANEOUS ART THORMBECTOMY/INFUSION INTRACRANIAL INCLUDE DIAG ANGIO; IR CT HEAD LIMITED ANESTHESIA/SEDATION: General MEDICATIONS: Intra-arterial verapamil 5 mg PROCEDURE: DIAGNOSTIC CEREBRAL ANGIOGRAM AND MECHANICAL THROMBECTOMY/ASPIRATION Operator: Dr. Pedro Earls, MD. Informed consent was obtained from patient's next of kin and attach to patient's chart. The patient was prepped and draped using usual sterile technique. Using a micropuncture kit and modified Seldinger technique, access was gained to the right common femoral artery and an 8 French sheath was placed. Under fluoroscopy, an 8 French walrus balloon guide catheter was navigated over a 6 [place] Berenstein II catheter and a 0.038" Terumo Glidewire into the aortic arch. Under fluoroscopy, the catheter was placed into the left common carotid artery. Frontal and lateral angiograms of the neck were obtained. Atherosclerotic disease is seen involving the distal left common carotid artery, carotid bifurcation and proximal cervical left ICA, more pronounced at the level of the bulb where a calcified plaque causes approximately 45% stenosis. A proximal left M1/MCA occlusion is identified with moderate collaterals from the left ACA to the left MCA territory. Using biplane roadmap, a catalyst 6 aspiration catheter was navigated over a phenom 21 microcatheter and a synchro support microguidewire into the petrous segment of the left ICA. Frontal and lateral angiograms of the head were obtained, confirming the proximal left M1 occlusion. The microcatheter was then navigated over the microwire into the left M2/MCA middle division  branch. A 6 mm solitaire thrombectomy device was then deployed spanning the left M1 and left M2/MCA middle division branch. The microcatheter was removed under fluoroscopy. The thrombectomy device was allowed to intercalated with the clot for 4 minutes. The guiding catheter balloon was inflated in the upper cervical segment of the left ICA. The thrombectomy device and aspiration catheter were then removed under constant aspiration. Follow-up angiogram showed recanalization of the left M1/MCA with slow flow in the middle division branch related to device induced vasospasm. Intra arterial infusion of 5 mg of verapamil was performed to the left ICA over 10 minutes. Follow-up angiogram showed near complete resolution of the vasospasm with complete restoration of flow (TICI 3). Postprocedure flat panel CT of the head was obtained. Images were post processed in a separate workstation under concurrent attending physician supervision. No evidence of hemorrhagic complication identified. Left common femoral artery angiogram in frontal and lateral views showed access at the level of the common femoral artery. The sheath was removed and the access site was closed with a Perclose ProGlide. Immediate hemostasis achieved. COMPLICATIONS: None immediate FINDINGS: 1. 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 2. 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 IMPRESSION: 1. Successful and uncomplicated mechanical thrombectomy and aspiration for treatment of a left M1/MCA proximal occlusion with complete restoration of flow (TICI 3) after 1 thrombectomy device pass. 2. No hemorrhagic complication or embolus to new territory. PLAN: Follow-up 1 month after discharge. Carotid duplex recommended prior to consultation. Electronically Signed   By:  [name] [name] M.D.   On: 01/30/2020 16:43   CT Code Stroke Cerebral Perfusion with contrast  Result  Date: 01/30/2020 CLINICAL DATA:  Stroke symptoms EXAM: CT ANGIOGRAPHY HEAD AND NECK CT PERFUSION BRAIN TECHNIQUE: Multidetector CT imaging of the head and neck was performed using the standard protocol during bolus administration of intravenous contrast. Multiplanar CT image reconstructions and MIPs were obtained to evaluate the vascular anatomy. Carotid stenosis measurements (when applicable) are obtained utilizing NASCET criteria, using the distal internal carotid diameter as the denominator. Multiphase CT imaging of the brain was performed following IV bolus contrast injection. Subsequent parametric perfusion maps were calculated using RAPID software. CONTRAST:  Dose is not available COMPARISON:  Head CT from earlier today FINDINGS: CTA NECK FINDINGS Aortic arch: Atherosclerosis.  Changes of recent cardiac surgery. Right carotid system: Atherosclerotic plaque primarily at the bifurcation with up to 40% stenosis Left carotid system: Advanced atheromatous narrowing at the ICA bulb, flow reducing and measuring 80% on sagittal reformats. No dissection. Vertebral arteries: No proximal subclavian stenosis. The right vertebral artery is dominant and diffusely patent. Proximal left vertebral occlusion with reconstitution and robust flow by the dura. 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 Other neck: No acute finding. Upper chest: Bilateral upper lobe subsegmental pulmonary emboli with central branching acute appearance. Review of the MIP images confirms the above findings CTA HEAD FINDINGS Anterior circulation: Left M1 occlusion. Atherosclerotic calcification of both carotid siphons with moderate narrowing at the left paraclinoid segment. Posterior circulation: The right vertebral artery is dominant. Diffuse atheromatous plaque seen along the left V4 segment with patency to the basilar. The basilar is smooth and diffusely patent. No PCA branch occlusion or flow limiting stenosis. Mild right P2  segment narrowing. Venous sinuses: Unremarkable in the arterial phase Anatomic variants: None significant Review of the MIP images confirms the above findings CT Brain Perfusion Findings: ASPECTS: 10 CBF (<30%) Volume: 25m Perfusion (Tmax>6.0s) volume: 153mMismatch Volume: 85 cc of penumbramL Critical Value/emergent results were called by telephone at the time of interpretation on 01/30/2020 at 7:01 am [facility] who verbally acknowledged these results. IMPRESSION: 1. Emergent large vessel occlusion at the left M1 segment. 2. Aspects of 10 with CT perfusion suggesting core infarct of 74 cc and 85 cc of penumbra. 3. Flow reducing left ICA bulb stenosis. 4. Bilateral subsegmental acute pulmonary emboli. 5. Proximal occlusion of the non dominant left vertebral artery with reconstitution at the dura. Electronically Signed   By: JoMonte Fantasia.D.   On: 01/30/2020 07:04   DG CHEST PORT 1 VIEW  Result Date: 02/02/2020 CLINICAL DATA:  Chest tube present. EXAM: PORTABLE CHEST 1 VIEW COMPARISON:  February 01, 2020. FINDINGS: Stable cardiomegaly. Endotracheal and feeding tubes are in grossly good position. Bilateral chest tubes are noted without pneumothorax. Left lung is clear. Mild right basilar subsegmental atelectasis is noted with small right pleural effusion. Bony thorax is unremarkable. Stable position of right internal jugular catheter. IMPRESSION: Stable support apparatus. Bilateral chest tubes are noted without pneumothorax. Mild right basilar subsegmental atelectasis is noted with small right pleural effusion. Electronically Signed   By: JaMarijo Conception.D.   On: 02/02/2020 08:37   DG CHEST PORT 1 VIEW  Result Date: 02/01/2020 CLINICAL DATA:  Intubated patient status post left frontal and basal ganglia infarct. EXAM: PORTABLE CHEST 1 VIEW COMPARISON:  Single-view of the chest 01/30/2020 and 01/29/2020. FINDINGS: Endotracheal tube remains in place with the tip 1.6 cm above  the carina. Right  IJ central venous catheters, OG tube, mediastinal drain and bilateral chest tubes are again seen. Small to moderate pleural effusions are present, greater on the right, with associated basilar atelectasis. No pneumothorax. Heart size is upper normal. Atherosclerosis noted. IMPRESSION: Support tubes and lines project in good position.  No pneumothorax. No notable change in a small to moderate right pleural effusion and basilar atelectasis. Very small left pleural effusion noted. Electronically Signed   By: Inge Rise M.D.   On: 02/01/2020 09:50   DG CHEST PORT 1 VIEW  Result Date: 01/30/2020 CLINICAL DATA:  Intubation of airway EXAM: PORTABLE CHEST 1 VIEW COMPARISON:  01/30/2020 FINDINGS: Endotracheal tube tip 2 cm above the carina. NGT placed with the tip in the stomach. Right jugular central venous catheter tips unchanged. Two catheters in place in the right SVC. Bilateral chest tubes in place. Mediastinal drain in place. No pneumothorax. Right lower lobe atelectasis unchanged. Right hilar surgical clips again noted. No significant effusion. IMPRESSION: Endotracheal tube placed with the tip 2 cm above the carina. Right lower lobe atelectasis unchanged. Bilateral chest tubes without pneumothorax. Electronically Signed   By: Franchot Gallo M.D.   On: 01/30/2020 10:07   DG Chest Port 1 View  Result Date: 01/30/2020 CLINICAL DATA:  Chest tube, post open heart surgery EXAM: PORTABLE CHEST 1 VIEW COMPARISON:  Portable exam 0506 hours compared to 01/29/2020 FINDINGS: Mediastinal drain and BILATERAL thoracostomy tubes present. Two RIGHT jugular central venous catheters, tips projecting over SVC and cavoatrial junction. Epicardial pacing wires present. Enlargement of cardiac silhouette with pulmonary vascular congestion. Prominent mediastinum consistent with preceding surgery. Scattered atelectasis in the mid to lower lungs bilaterally with small RIGHT pleural effusion. Tiny LEFT apex pneumothorax, decreased.  Bones demineralized with LEFT glenohumeral degenerative changes noted as well as degenerative changes of the thoracic spine. IMPRESSION: Bibasilar atelectasis and RIGHT pleural effusion. Tiny LEFT apex pneumothorax despite thoracostomy tube, decreased. Electronically Signed   By: Lavonia Dana M.D.   On: 01/30/2020 07:55   EEG adult  Result Date: 01/31/2020 Lora Havens, MD     01/31/2020  9:42 AM Patient Name: [name] [name] MRN: [id number] Epilepsy Attending: Lora Havens Referring Physician/Provider: Dr Rosalin Hawking Date: 01/31/2020 Duration: 24.48 mins Patient history: 79yo F with left MCA infarct continues to be lethargic. EEG to evaluate for seizure. Level of alertness: lethargic AEDs during EEG study: LEV Technical aspects: This EEG study was done with scalp electrodes positioned according to the 10-20 International system of electrode placement. Electrical activity was acquired at a sampling rate of '500Hz'  and reviewed with a high frequency filter of '70Hz'  and a low frequency filter of '1Hz' . EEG data were recorded continuously and digitally stored. DESCRIPTION: EEG showed continuous generalized and lateralized left hemisphere 3-'6hz'  theta-delta slowing. Sharp waves were seen in left frontotemporal region, maximal T7. Hyperventilation and photic stimulation were not performed. 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 No seizures were seen throughout the recording. Lora Havens   ECHOCARDIOGRAM COMPLETE  Result Date: 01/30/2020    ECHOCARDIOGRAM REPORT   Patient Name:   [name] [name] Date of Exam: 01/30/2020 Medical Rec #:  [id number]          Height:       63.0 in Accession #:  [id number]         Weight:       156.1 lb Date of Birth:   1941/04/23          BSA:          1.740 m Patient Age:    41 years           BP:           133/64 mmHg Patient Gender: F                  HR:           67 bpm. Exam Location:  Inpatient Procedure: 2D Echo Indications:    Stroke 434.91 / I163.9  History:        Patient has prior history of Echocardiogram examinations. CAD,                 Prior CABG; Risk Factors:Hypertension and Dyslipidemia. External                 pacemaker.  Sonographer:    Darlina Sicilian RDCS Referring Phys: [id number] ASHISH ARORA IMPRESSIONS  1. Left ventricular ejection fraction, by estimation, is 30 to 35%. The left ventricle has moderately decreased function. The left ventricle demonstrates global hypokinesis. There is mild asymmetric left ventricular hypertrophy of the septal segment. Left ventricular diastolic parameters are consistent with Grade I diastolic dysfunction (impaired relaxation). Elevated left ventricular end-diastolic pressure.  2. Right ventricular systolic function is normal. The right ventricular size is normal. There is mildly elevated pulmonary artery systolic pressure.  3. Left atrial size was mildly dilated.  4. The mitral valve is normal in structure. Trivial mitral valve regurgitation. No evidence of mitral stenosis.  5. The aortic valve is normal in structure. Aortic valve regurgitation is not visualized. No aortic stenosis is present.  6. There is mild dilatation of the ascending aorta measuring 38 mm.  7. The inferior vena cava is normal in size with greater than 50% respiratory variability, suggesting right atrial pressure of 3 mmHg. FINDINGS  Left Ventricle: Left ventricular ejection fraction, by estimation, is 30 to 35%. The left ventricle has moderately decreased function. The left ventricle demonstrates global hypokinesis. The left ventricular internal cavity size was normal in size. There is mild asymmetric left ventricular hypertrophy of the septal segment. Left ventricular diastolic parameters are  consistent with Grade I diastolic dysfunction (impaired relaxation). Elevated left ventricular end-diastolic pressure. Right Ventricle: The right ventricular size is normal. No increase in right ventricular wall thickness. Right ventricular systolic function is normal. There is mildly elevated pulmonary artery systolic pressure. The tricuspid regurgitant velocity is 2.40  m/s, and with an assumed right atrial pressure of 15 mmHg, the estimated right ventricular systolic pressure is 63.8 mmHg. Left Atrium: Left atrial size was mildly dilated. Right Atrium: Right atrial size was normal in size. Pericardium: There is no evidence of pericardial effusion. Mitral Valve: The mitral valve is normal in structure. Normal mobility of the mitral valve leaflets. Trivial mitral valve regurgitation. No evidence of mitral valve stenosis. Tricuspid Valve: The tricuspid valve is normal in structure. Tricuspid valve regurgitation is mild . No evidence of tricuspid stenosis. Aortic Valve: The aortic valve is normal in structure. Aortic valve regurgitation is not visualized. No aortic stenosis is present. Pulmonic Valve: The pulmonic valve was normal in structure. Pulmonic valve regurgitation is mild. No evidence of pulmonic stenosis. Aorta: The aortic root is normal in size and structure. There is mild dilatation of  the ascending aorta measuring 38 mm. Venous: The inferior vena cava is normal in size with greater than 50% respiratory variability, suggesting right atrial pressure of 3 mmHg. IAS/Shunts: No atrial level shunt detected by color flow Doppler.  LEFT VENTRICLE PLAX 2D LVIDd:         3.60 cm     Diastology LVIDs:         2.40 cm     LV e' lateral:   4.91 cm/s LV PW:         1.00 cm     LV E/e' lateral: 14.3 LV IVS:        1.10 cm     LV e' medial:    3.59 cm/s                            LV E/e' medial:  19.6  LV Volumes (MOD) LV vol d, MOD A2C: 69.0 ml LV vol d, MOD A4C: 88.7 ml LV vol s, MOD A2C: 48.3 ml LV vol s, MOD A4C:  57.1 ml LV SV MOD A2C:     20.7 ml LV SV MOD A4C:     88.7 ml LV SV MOD BP:      26.0 ml LEFT ATRIUM             Index       RIGHT ATRIUM           Index LA diam:        3.40 cm 1.95 cm/m  RA Area:     11.10 cm LA Vol (A2C):   32.3 ml 18.56 ml/m RA Volume:   21.20 ml  12.18 ml/m LA Vol (A4C):   55.4 ml 31.83 ml/m LA Biplane Vol: 43.2 ml 24.82 ml/m  AORTIC VALVE LVOT Vmax:   71.10 cm/s LVOT Vmean:  47.400 cm/s LVOT VTI:    0.141 m  AORTA Ao Root diam: 2.60 cm Ao Asc diam:  3.80 cm MITRAL VALVE                TRICUSPID VALVE MV Area (PHT): 3.53 cm     TR Peak grad:   23.0 mmHg MV Decel Time: 215 msec     TR Vmax:        240.00 cm/s MV E velocity: 70.30 cm/s MV A velocity: 104.00 cm/s  SHUNTS MV E/A ratio:  0.68         Systemic VTI: 0.14 m Skeet Latch MD Electronically signed by Skeet Latch MD Signature Date/Time: 01/30/2020/5:25:49 PM    Final    IR PERCUTANEOUS ART THROMBECTOMY/INFUSION INTRACRANIAL INC DIAG ANGIO  Result Date: 01/30/2020 CLINICAL DATA:  79 year old female with past medical history of coronary artery disease, hypertension, hyperlipidemia and thyroid cancer who was admitted for three-vessel CABG performed 2 days ago. She developed the sudden right-sided weakness. Last known well was 6 a.m. today. Baseline mRS is unknown (likely 0-1). No IV tPA given due to recent surgery. Head CT showed no acute infarct or hemorrhage. CT angiogram showed a left ICA T occlusion extending to the MCA bifurcation with moderate to poor collaterals. CT perfusion showing possible core infarct of 74 mL with a mismatch core/penumbra of 85 mL. She was brought to our service for an emergency mechanical thrombectomy. EXAM: IR PERCUTANEOUS ART THORMBECTOMY/INFUSION INTRACRANIAL INCLUDE DIAG ANGIO; IR CT HEAD LIMITED ANESTHESIA/SEDATION: General MEDICATIONS: Intra-arterial verapamil 5 mg PROCEDURE: DIAGNOSTIC CEREBRAL ANGIOGRAM AND MECHANICAL THROMBECTOMY/ASPIRATION Operator: Dr. Jeanella Craze  Debbrah Alar,  MD. Informed consent was obtained from patient's next of kin and attach to patient's chart. The patient was prepped and draped using usual sterile technique. Using a micropuncture kit and modified Seldinger technique, access was gained to the right common femoral artery and an 8 French sheath was placed. Under fluoroscopy, an 8 French walrus balloon guide catheter was navigated over a 6 [place] Berenstein II catheter and a 0.038" Terumo Glidewire into the aortic arch. Under fluoroscopy, the catheter was placed into the left common carotid artery. Frontal and lateral angiograms of the neck were obtained. Atherosclerotic disease is seen involving the distal left common carotid artery, carotid bifurcation and proximal cervical left ICA, more pronounced at the level of the bulb where a calcified plaque causes approximately 45% stenosis. A proximal left M1/MCA occlusion is identified with moderate collaterals from the left ACA to the left MCA territory. Using biplane roadmap, a catalyst 6 aspiration catheter was navigated over a phenom 21 microcatheter and a synchro support microguidewire into the petrous segment of the left ICA. Frontal and lateral angiograms of the head were obtained, confirming the proximal left M1 occlusion. The microcatheter was then navigated over the microwire into the left M2/MCA middle division branch. A 6 mm solitaire thrombectomy device was then deployed spanning the left M1 and left M2/MCA middle division branch. The microcatheter was removed under fluoroscopy. The thrombectomy device was allowed to intercalated with the clot for 4 minutes. The guiding catheter balloon was inflated in the upper cervical segment of the left ICA. The thrombectomy device and aspiration catheter were then removed under constant aspiration. Follow-up angiogram showed recanalization of the left M1/MCA with slow flow in the middle division branch related to device induced vasospasm. Intra arterial infusion of 5 mg of  verapamil was performed to the left ICA over 10 minutes. Follow-up angiogram showed near complete resolution of the vasospasm with complete restoration of flow (TICI 3). Postprocedure flat panel CT of the head was obtained. Images were post processed in a separate workstation under concurrent attending physician supervision. No evidence of hemorrhagic complication identified. Left common femoral artery angiogram in frontal and lateral views showed access at the level of the common femoral artery. The sheath was removed and the access site was closed with a Perclose ProGlide. Immediate hemostasis achieved. COMPLICATIONS: None immediate FINDINGS: 1. 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 2. 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 IMPRESSION: 1. Successful and uncomplicated mechanical thrombectomy and aspiration for treatment of a left M1/MCA proximal occlusion with complete restoration of flow (TICI 3) after 1 thrombectomy device pass. 2. No hemorrhagic complication or embolus to new territory. PLAN: Follow-up 1 month after discharge. Carotid duplex recommended prior to consultation. Electronically Signed   By: [name] [name] M.D.   On: 01/30/2020 16:43   CT HEAD CODE STROKE WO CONTRAST  Result Date: 01/30/2020 CLINICAL DATA:  Code stroke. Ataxia. Less responsive. Left-sided gaze. EXAM: CT HEAD WITHOUT CONTRAST TECHNIQUE: Contiguous axial images were obtained from the base of the skull through the vertex without intravenous contrast. COMPARISON:  Brain MRI 09/06/2018 FINDINGS: Brain: No evidence of acute infarction, hemorrhage, hydrocephalus, extra-axial collection or mass lesion/mass effect. Vascular: Hyperdense left M1 segment Skull: Negative Sinuses/Orbits: Negative Other: These results were communicated to Dr. Rory Percy at 6:35 amon 3/15/2021by text page via the [facility] messaging system. ASPECTS [facility] Stroke Program  Early CT Score) - Ganglionic level infarction (caudate, lentiform nuclei, internal capsule, insula, M1-M3  cortex): 7 - Supraganglionic infarction (M4-M6 cortex): 3 Total score (0-10 with 10 being normal): 10 IMPRESSION: 1. Hyperdense left M1 segment.  CTA and perfusion are in progress. 2. ASPECTS is 10. Electronically Signed   By: Monte Fantasia M.D.   On: 01/30/2020 06:38   VAS [place] LOWER EXTREMITY VENOUS (DVT)  Result Date: 01/30/2020  Lower Venous DVTStudy Indications: Pulmonary embolism.  Risk Factors: Confirmed PE. Limitations: Poor ultrasound/tissue interface and patient positioning, patient immobility. Comparison Study: No prior studies. Performing Technologist: Oliver Hum RVT  Examination Guidelines: A complete evaluation includes B-mode imaging, spectral Doppler, color Doppler, and power Doppler as needed of all accessible portions of each vessel. Bilateral testing is considered an integral part of a complete examination. Limited examinations for reoccurring indications may be performed as noted. The reflux portion of the exam is performed with the patient in reverse Trendelenburg.  +---------+---------------+---------+-----------+----------+--------------+ RIGHT    CompressibilityPhasicitySpontaneityPropertiesThrombus Aging +---------+---------------+---------+-----------+----------+--------------+ CFV      Full           Yes      Yes                                 +---------+---------------+---------+-----------+----------+--------------+ SFJ      Full                                                        +---------+---------------+---------+-----------+----------+--------------+ FV Prox  Full                                                        +---------+---------------+---------+-----------+----------+--------------+ FV Mid   Full                                                         +---------+---------------+---------+-----------+----------+--------------+ FV DistalFull                                                        +---------+---------------+---------+-----------+----------+--------------+ PFV      Full                                                        +---------+---------------+---------+-----------+----------+--------------+ POP      Full           Yes      Yes                                 +---------+---------------+---------+-----------+----------+--------------+ PTV      Full                                                        +---------+---------------+---------+-----------+----------+--------------+  PERO     Full                                                        +---------+---------------+---------+-----------+----------+--------------+   +---------+---------------+---------+-----------+----------+--------------+ LEFT     CompressibilityPhasicitySpontaneityPropertiesThrombus Aging +---------+---------------+---------+-----------+----------+--------------+ CFV      Full           Yes      Yes                                 +---------+---------------+---------+-----------+----------+--------------+ SFJ      Full                                                        +---------+---------------+---------+-----------+----------+--------------+ FV Prox  Full                                                        +---------+---------------+---------+-----------+----------+--------------+ FV Mid   Full                                                        +---------+---------------+---------+-----------+----------+--------------+ FV DistalFull                                                        +---------+---------------+---------+-----------+----------+--------------+ PFV      Full                                                         +---------+---------------+---------+-----------+----------+--------------+ POP      Full           Yes      Yes                                 +---------+---------------+---------+-----------+----------+--------------+ PTV      Partial                                      Acute          +---------+---------------+---------+-----------+----------+--------------+ PERO     Full                                                        +---------+---------------+---------+-----------+----------+--------------+  Summary: RIGHT: - There is no evidence of deep vein thrombosis in the lower extremity.  - No cystic structure found in the popliteal fossa.  LEFT: - Findings consistent with acute deep vein thrombosis involving the left posterior tibial veins. - No cystic structure found in the popliteal fossa.  *See table(s) above for measurements and observations. Electronically signed by Ruta Hinds MD on 01/30/2020 at 7:04:03 PM.    Final     Labs:  CBC: Recent Labs    01/30/20 0438 01/30/20 1200 01/31/20 0550 01/31/20 0559 02/01/20 0349 02/02/20 0411  WBC 13.0*  --  11.4*  --  10.6* 12.9*  HGB 10.2*   < > 9.6* 10.2* 9.9* 9.8*  HCT 31.1*   < > 29.2* 30.0* 30.8* 30.3*  PLT 165  --  182  --  199 213   < > = values in this interval not displayed.    COAGS: Recent Labs    01/27/20 0524 01/27/20 2048 01/28/20 0625 01/30/20 0955  INR 1.0 1.5* 1.1 1.1  APTT 76* 33 30  --     BMP: Recent Labs    01/30/20 0438 01/30/20 1200 01/31/20 0550 01/31/20 0559 02/01/20 0349 02/02/20 0411  NA 137   < > 140 139 142 144  K 3.8   < > 3.7 3.5 3.6 3.4*  CL 101  --  106  --  109 111  CO2 28  --  25  --  23 25  GLUCOSE 112*  --  120*  --  124* 136*  BUN 19  --  14  --  12 15  CALCIUM 9.6  --  9.5  --  9.7 9.5  CREATININE 1.19*  --  0.93  --  0.76 0.65  GFRNONAA 44*  --  60*  --  >60 >60  GFRAA 51*  --  >60  --  >60 >60   < > = values in this interval not displayed.    LIVER  FUNCTION TESTS: No results for input(s): BILITOT, AST, ALT, ALKPHOS, PROT, ALBUMIN in the last 8760 hours.  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 Karenann [name].  She has improved since exam yesterday.  Further plans per TCTS/neurology/CCM.  Electronically Signed: Murrell Redden, PA-C 02/02/2020, 9:48 AM    I spent a total of 15 Minutes at the the patient's bedside AND on the patient's hospital floor or unit, greater than 50% of which was counseling/coordinating care for f/u stroke/revasc.

## 2020-02-02 NOTE — Procedures (Signed)
Extubation Procedure Note  Patient Details:   Name: [name] [name] DOB: April 19, 1941 MRN: [id number]   Airway Documentation:    Vent end date: 02/02/20 Vent end time: 0859   Evaluation  O2 sats: stable throughout Complications: No apparent complications Patient did tolerate procedure well. Bilateral Breath Sounds: Clear, Diminished   No  PT was extubated to a 3L [place]  PT was unable to talk but effort was made  Sats are stable and RT to monitor   Aftyn Nott, Leonie Douglas 02/02/2020, 9:01 AM

## 2020-02-02 NOTE — Progress Notes (Signed)
Madison RN aware of order to d/c cvc once PICC line placed.

## 2020-02-02 NOTE — Progress Notes (Signed)
Rehab Admissions Coordinator Note:  Patient was screened by Cleatrice Burke for appropriateness for an Inpatient Acute Rehab Consult per PT recs.  At this time, we are recommending Inpatient Rehab consult it pt would like to be considered for CIR admit. Please advise.  Cleatrice Burke RN MSN 02/02/2020, 2:25 PM  I can be reached at [phone].

## 2020-02-02 NOTE — Procedures (Signed)
Intubation Procedure Note [name] [name] [id number] 06-Feb-1941  Procedure: Intubation Indications: Respiratory insufficiency  Procedure Details Consent: Risks of procedure as well as the alternatives and risks of each were explained to the (patient/caregiver).  Consent for procedure obtained. Time Out: Verified patient identification, verified procedure, site/side was marked, verified correct patient position, special equipment/implants available, medications/allergies/relevent history reviewed, required imaging and test results available.  Performed  Maximum sterile technique was used including gloves and mask.  MAC and 4    Evaluation Hemodynamic Status: BP stable throughout; O2 sats: stable throughout Patient's Current Condition: stable Complications: Complications of pt tongue, vocal cords and arytenoids were extremely edematous and friable. unable to pass 7.5 ett, given steroids, able to get 7.0 tube pass with some difficulty. copious blood tinged secretions suctioned in line post extubation.  Patient did tolerate procedure well. Chest X-ray ordered to verify placement.  CXR: pending.  sats remained >98% thru entire procedure.  Audria Nine 02/02/2020

## 2020-02-02 NOTE — Progress Notes (Signed)
Peripherally Inserted Central Catheter Placement  The IV Nurse has discussed with the patient and/or persons authorized to consent for the patient, the purpose of this procedure and the potential benefits and risks involved with this procedure.  The benefits include less needle sticks, lab draws from the catheter, and the patient may be discharged home with the catheter. Risks include, but not limited to, infection, bleeding, blood clot (thrombus formation), and puncture of an artery; nerve damage and irregular heartbeat and possibility to perform a PICC exchange if needed/ordered by physician.  Alternatives to this procedure were also discussed.  Bard Power PICC patient education guide, fact sheet on infection prevention and patient information card has been provided to patient /or left at bedside.  Phone consent obtain from nephew.   PICC Placement Documentation  PICC Double Lumen [id number] PICC Right Basilic 40 cm 0 cm (Active)  Indication for Insertion or Continuance of Line Vasoactive infusions 02/02/20 1532  Exposed Catheter (cm) 0 cm 02/02/20 1532  Site Assessment Clean;Dry;Intact 02/02/20 1532  Lumen #1 Status Flushed;Saline locked;Blood return noted 02/02/20 1532  Lumen #2 Status Flushed;Saline locked;Blood return noted 02/02/20 1532  Dressing Type Transparent 02/02/20 1532  Dressing Status Clean;Dry;Intact;Antimicrobial disc in place 02/02/20 1532  Dressing Change Due 02/09/20 02/02/20 1532       Gordan Payment 02/02/2020, 3:33 PM

## 2020-02-02 NOTE — Progress Notes (Signed)
Patient ID: [name] [name], female   DOB: October 16, 1941, 79 y.o.   MRN: [id number] EVENING ROUNDS NOTE :     [address]       [place] [address]             [phone]                 3 Days Post-Op Procedure(s) (LRB): IR WITH ANESTHESIA (N/A)  Total Length of Stay:  LOS: 7 days  BP (!) 148/64   Pulse 75   Temp 98.4 F (36.9 C)   Resp 16   Ht 5\' 3"  (1.6 m)   Wt 68.5 kg   SpO2 100%   BMI 26.75 kg/m   .Intake/Output      03/17 0701 - 03/18 0700 03/18 0701 - 03/19 0700   I.V. (mL/kg) 599.8 (8.8) 245.4 (3.6)   NG/GT 691.5 360   IV Piggyback 211.2 268.7   Total Intake(mL/kg) 1502.5 (21.9) 874 (12.8)   Urine (mL/kg/hr) 2635 (1.6) 925 (1.2)   Emesis/NG output 62    Stool     Chest Tube 280 70   Total Output 2977 995   Net -1474.5 -121          . sodium chloride    . sodium chloride Stopped (01/30/20 1457)  . feeding supplement (VITAL AF 1.2 CAL) 1,000 mL (02/01/20 1438)  . heparin 850 Units/hr (02/02/20 1700)  . insulin Stopped (01/28/20 1543)  . lactated ringers    . levETIRAcetam Stopped (02/02/20 0939)  . niCARDipine Stopped (02/02/20 [id number])     Lab Results  Component Value Date   WBC 12.9 (H) 02/02/2020   HGB 9.5 (L) 02/02/2020   HCT 28.0 (L) 02/02/2020   PLT 213 02/02/2020   GLUCOSE 136 (H) 02/02/2020   CHOL 81 01/31/2020   TRIG 90 01/31/2020   HDL 35 (L) 01/31/2020   LDLCALC 28 01/31/2020   NA 143 02/02/2020   K 3.9 02/02/2020   CL 111 02/02/2020   CREATININE 0.65 02/02/2020   BUN 15 02/02/2020   CO2 25 02/02/2020   INR 1.1 01/30/2020   HGBA1C 5.1 01/27/2020   Did not tolerated extubation Back on vent   Grace Isaac MD  Beeper [phone] Office [phone] 02/02/2020 5:55 PM

## 2020-02-02 NOTE — Progress Notes (Signed)
ANTICOAGULATION CONSULT NOTE - [place] for Heparin Indication: pulmonary embolus, stroke and DVT, and recent CABG  Allergies  Allergen Reactions  . Bee Venom Swelling    Massive swelling  . Penicillins Other (See Comments)    UNSPECIFIED REACTION  Did it involve swelling of the face/tongue/throat, SOB, or low BP? Unknown Did it involve sudden or severe rash/hives, skin peeling, or any reaction on the inside of your mouth or nose? Unknown Did you need to seek medical attention at a hospital or doctor's office? Unknown When did it last happen?1945 If all above answers are "NO", may proceed with cephalosporin use.  . Adhesive [Tape] Rash  . Morphine And Related Rash  . Sulfa Antibiotics Nausea And Vomiting and Rash    Patient Measurements: Height: 5\' 3"  (160 cm) Weight: 152 lb 5.4 oz (69.1 kg) IBW/kg (Calculated) : 52.4 Heparin Dosing Weight: 65.3 kg  Vital Signs: Temp: 96.6 F (35.9 C) (03/18 0342) Temp Source: Axillary (03/18 0342) BP: 140/67 (03/18 0300) Pulse Rate: 70 (03/18 0300)  Labs: Recent Labs    01/30/20 0955 01/30/20 1200 01/31/20 0550 01/31/20 0550 01/31/20 0559 01/31/20 0559 01/31/20 0626 01/31/20 1355 02/01/20 0349 02/02/20 0411  HGB  --    < > 9.6*   < > 10.2*   < >  --   --  9.9* 9.8*  HCT  --    < > 29.2*   < > 30.0*  --   --   --  30.8* 30.3*  PLT  --   --  182  --   --   --   --   --  199 213  LABPROT 14.1  --   --   --   --   --   --   --   --   --   INR 1.1  --   --   --   --   --   --   --   --   --   HEPARINUNFRC  --    < >  --   --   --   --    < > 0.32 0.36 0.26*  CREATININE  --   --  0.93  --   --   --   --   --  0.76 0.65   < > = values in this interval not displayed.    Estimated Creatinine Clearance: 55.8 mL/min (by C-G formula based on SCr of 0.65 mg/dL).  Medications:  Plavix is being held as changing to full anticoagulation with IV Heparin  Assessment: 79 year old female admitted with angina  on 3/11 and s/p CABG on 3/12 who had acute stroke symptoms on 3/15 AM. CTA of Head and Neck showed an acute MCA infarct and bilateral subsegmental acute pulmonary emboli. She is now s/p IR guided mechanical thrombectomy this AM and Pharmacy has been consulted to start IV Heparin therapy. 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 D-dimer was 1.25, fibrinogen 652. INR was normal at 1.1  Heparin level this morning is slightly subtherapeutic (0.26) on gtt at 750 units/hr. No issues with line or bleeding reported per RN.  Goal of Therapy:  Heparin level 0.3 to 0.5 units/ml Monitor platelets by anticoagulation protocol: Yes   Plan:  - Increase heparin infusion to 850 units/hr - F/u 8 hr heparin level  Sherlon Handing, PharmD, BCPS Please see amion for complete clinical pharmacist phone list 02/02/2020 6:04 AM

## 2020-02-02 NOTE — Progress Notes (Signed)
NAME:  [name] [name], MRN:  [id number], DOB:  [birth year], LOS: 7 ADMISSION DATE:  01/26/2020, CONSULTATION DATE:  01/30/2020 REFERRING MD:  Rory Percy CHIEF COMPLAINT:  Right hemiplegia   Brief History        This 79 year old was admitted on 3/8 for evaluation of chest pain.  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  She was up ambulating today and suddenly developed a right hemiplegia.  CTA showed an M1 occlusion and thrombectomy was performed.  An attempt was made to extubate the patient post procedure but this was not tolerated.  Of note at the time the CTA was performed she was also found to have pulmonary emboli.  History of present illness       History is as noted above with the following additions.  Intraoperative TEE showed essentially normal LV systolic dysfunction there was some LVH and a PFO with a left to right flow.  There is no thrombus noted in the left atrial appendage.  Past Medical History  Thyroid cancer, hypertension, hyperlipidemia.  Significant Hospital Events   Cardiac cath 3/12, CABG 3/13  Consults:  Thoracic surgery, neurology, interventional radiology.  Procedures:  Four-vessel CABG, left M1 thrombectomy  Significant Diagnostic Tests:  3/15 LE venous doppler:  RIGHT:  - There is no evidence of deep vein thrombosis in the lower extremity.    - No cystic structure found in the popliteal fossa.    LEFT:  - Findings consistent with acute deep vein thrombosis involving the left  posterior tibial veins.  - No cystic structure found in the popliteal fossa.  3/15 echo: LVEF [id number], grade 1 diastolic dysfunction, LA dilation, RVSP mildly elevated 3/16 cth: No acute intracranial hemorrhage. Likely evolving recent infarction of the lentiform nucleus. 3/15 cta head/neck 1. Emergent large vessel occlusion at the left M1 segment. 2. Aspects of 10 with CT perfusion suggesting core infarct of 74  cc and 85 cc of penumbra. 3. Flow reducing left ICA bulb stenosis. 4. Bilateral subsegmental acute pulmonary emboli. 5. Proximal occlusion of the non dominant left vertebral artery with reconstitution at the dura. 3/16 eeg: This study showed evidence of epileptogenicity arising from left frontotemporal as well as cortical dysfunction in left hemisphere likely secondary to underlying stroke. No seizures were seen throughout the recording.  Micro Data:  3/8 sars2: neg  Antimicrobials:  None   Interim history/subjective:  3/18: remains off sedation but not consistently awake yesterday to extubate. Otherwise tolerated cpap all day however.  However this am, awakens easily and is doing well. Following commands, tongue thrust and sustained head lift. Will extubate.   3/17: sz yesterday placed on keppra. RN reports wakes up and weakly follows commands but unable to illicit any response on my exam.  3/16: She remains intubated and mechanically ventilated this morning.  She is on no sedation at the time of my exam. Objective   Blood pressure (!) 142/60, pulse 70, temperature (!) 96.6 F (35.9 C), temperature source Axillary, resp. rate 19, height 5\' 3"  (1.6 m), weight 68.5 kg, SpO2 100 %.    Vent Mode: PRVC FiO2 (%):  [40 %] 40 % Set Rate:  [15 bmp] 15 bmp Vt Set:  [420 mL-450 mL] 420 mL PEEP:  [5 cmH20] 5 cmH20 Pressure Support:  [10 cmH20] 10 cmH20 Plateau Pressure:  [15 cmH20-24 cmH20] 15 cmH20   Intake/Output Summary (Last 24 hours) at 02/02/2020 [id number] Last data filed at 02/02/2020  [id number] Gross per 24 hour  Intake 1502.47 ml  Output 2977 ml  Net -1474.53 ml   Filed Weights   01/31/20 0500 02/01/20 0500 02/02/20 0600  Weight: 70.2 kg 69.1 kg 68.5 kg    Examination: General: She is orally intubated and mechanically ventilated.  She is on no sedation.  Awake and following commands bilaterally  HENT:NCAT, EOMI, PERRLA Lungs: Respirations are unlabored on a pressure support of 10.   There is symmetric air movement, no wheezes.  Chest tubes remain in place 280 out over 24hr Cardiovascular: S1 and S2 are regular without murmur rub or gallop.  Pacer wires in on back up rate Abdomen: The abdomen is soft without any organomegaly masses tenderness guarding or rebound.   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 Will eval again once extubated.  -eeg with focus noted and started on keppra Seizure: keppra Per neuro  Acute Pulmonary emboli:  -As noted this is distinctly unusual in a post bypass patient.   -Hep ab negative, anti jo, lupus, negative -remains on a/c Acute hypoxic Respiratory failure.   -improved and on cpap -will move to extubate and monitor closely   R Pleural effusion:  -still present despite chest tube in place.  -bedside u/s with small effusion. Cont to hold on drainage and see if mobilization with current chest tube allows for drainage. Again would rather do this extubated off positive pressure. Does not appear to be inhibiting resp status at this time.  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  Disposition: ICU  Labs   CBC: Recent Labs  Lab 01/29/20 0354 01/29/20 0354 01/30/20 0438  01/30/20 0438 01/30/20 1200 01/31/20 0550 01/31/20 0559 02/01/20 0349 02/02/20 0411  WBC 15.6*  --  13.0*  --   --  11.4*  --  10.6* 12.9*  NEUTROABS  --   --   --   --   --  9.2*  --   --   --   HGB 11.1*   < > 10.2*   < > 9.2* 9.6* 10.2* 9.9* 9.8*  HCT 33.6*   < > 31.1*   < > 27.0* 29.2* 30.0* 30.8* 30.3*  MCV 90.8  --  92.0  --   --  91.5  --  91.7 91.8  PLT 184  --  165  --   --  182  --  199 213   < > = values in this interval not displayed.    Basic Metabolic Panel: Recent Labs  Lab 01/28/20 0625 01/28/20 1244 01/28/20 1829 01/28/20 1829 01/29/20 0354 01/29/20 0354 01/30/20 [id number] 01/30/20 0438 01/30/20 1200 01/31/20 0550 01/31/20 0559 02/01/20 0349 02/01/20 1021 02/01/20 1631 02/02/20 0411  NA 142   < > 138   < >  138   < > 137   < > 138 140 139 142  --   --  144  K 3.4*   < > 3.8   < > 4.3   < > 3.8   < > 3.9 3.7 3.5 3.6  --   --  3.4*  CL 110  --  110   < > 109  --  101  --   --  106  --  109  --   --  111  CO2 24  --  21*   < > 22  --  28  --   --  25  --  23  --   --  25  GLUCOSE 159*  --  124*   < > 129*  --  112*  --   --  120*  --  124*  --   --  136*  BUN 12  --  13   < > 14  --  19  --   --  14  --  12  --   --  15  CREATININE 0.80  --  0.89   < > 1.00  --  1.19*  --   --  0.93  --  0.76  --   --  0.65  CALCIUM 8.8*  --  9.6   < > 9.8  --  9.6  --   --  9.5  --  9.7  --   --  9.5  MG 2.9*  --  2.4  --   --   --   --   --   --   --   --   --  2.0 1.9 1.7  PHOS  --   --   --   --   --   --   --   --   --   --   --   --  2.0* 2.0* 2.0*   < > = values in this interval not displayed.   GFR: Estimated Creatinine Clearance: 55.5 mL/min (by C-G formula based on SCr of 0.65 mg/dL). Recent Labs  Lab 01/30/20 0438 01/31/20 0550 02/01/20 0349 02/02/20 0411  WBC 13.0* 11.4* 10.6* 12.9*    Liver Function Tests: No results for input(s): AST, ALT, ALKPHOS, BILITOT, PROT, ALBUMIN in the last 168 hours. No results for input(s): LIPASE, AMYLASE in the last 168  hours. No results for input(s): AMMONIA in the last 168 hours.  ABG    Component Value Date/Time   PHART 7.429 01/31/2020 0559   PCO2ART 41.4 01/31/2020 0559   PO2ART 78.0 (L) 01/31/2020 0559   HCO3 27.4 01/31/2020 0559   TCO2 29 01/31/2020 0559   ACIDBASEDEF 2.0 01/28/2020 1545   O2SAT 96.0 01/31/2020 0559     Coagulation Profile: Recent Labs  Lab 01/27/20 0524 01/27/20 2048 01/28/20 0625 01/30/20 0955  INR 1.0 1.5* 1.1 1.1    Cardiac Enzymes: No results for input(s): CKTOTAL, CKMB, CKMBINDEX, TROPONINI in the last 168 hours.  HbA1C: Hgb A1c MFr Bld  Date/Time Value Ref Range Status  01/27/2020 05:24 AM 5.1 4.8 - 5.6 % Final    Comment:    (NOTE) Pre diabetes:          5.7%-6.4% Diabetes:              >6.4% Glycemic control for   <7.0% adults with diabetes     CBG: Recent Labs  Lab 02/01/20 1216 02/01/20 1600 02/01/20 2100 02/02/20  0152 02/02/20 0339  GLUCAP 105* 117* 122* 120* 125*     Critical care time: The patient is critically ill with multiple organ systems failure and requires high complexity decision making for assessment and support, frequent evaluation and titration of therapies, application of advanced monitoring technologies and extensive interpretation of multiple databases.  Critical care time 34 mins. This represents my time independent of the NPs time taking care of the pt. This is excluding procedures.    [facility] 02/02/2020, 7:52 AM

## 2020-02-02 NOTE — Progress Notes (Signed)
Called emergently to bedside for change in resp status. Pt is not able to clear secretions and too weak to remove NIV should there be issue making her not an NIV candidate.   We will move forward with intubation. D/w nephew who is in agreement and RN has updated Dr Orvan Seen.

## 2020-02-02 NOTE — Evaluation (Addendum)
Physical Therapy Evaluation Patient Details Name: [name] [name] MRN: [id number] DOB: 29-Jul-1941 Today's Date: 02/02/2020   History of Present Illness  Pt adm 3/11 after cardiac cath showed severe 3 vessel disease. Underwent CABG x 4 on 3/13. On 3/15 pt amb with nursing when she developed rt side weakness and decr responsiveness. 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 Pt taken to IR and found to have lt M1/MCA occlusion and underwent emergent mechanical thrombectomy. Pt extubated 3/18. PMH - CAD, HTN, thyroid CA, osteoporosis, THR  Clinical Impression  Pt admitted with above diagnosis and presents to PT with functional limitations due to deficits listed below (See PT problem list). Pt needs skilled PT to maximize independence and safety to allow discharge to CIR for further rehab. Expect pt will make good progress. Feel she needs intensity of inpatient rehab to maximize her potential. Needs frequent verbal cues for sternal precautions.     Follow Up Recommendations CIR    Equipment Recommendations  Other (comment)(To be determined)    Recommendations for Other Services       Precautions / Restrictions Precautions Precautions: Sternal;Fall Precaution Booklet Issued: No      Mobility  Bed Mobility Overal bed mobility: Needs Assistance Bed Mobility: Supine to Sit;Sit to Supine     Supine to sit: +2 for physical assistance;Mod assist;HOB elevated Sit to supine: +2 for safety/equipment;Mod assist   General bed mobility comments: Assist to bring RLE off of bed, elevate trunk into sitting and bring hips to EOB. Assist to lower trunk and bring RLE up into bed.  Transfers Overall transfer level: Needs assistance Equipment used: 2 person hand held assist Transfers: Sit to/from Stand Sit to Stand: +2 physical assistance;Mod assist         General transfer comment: Assist to bring hips up and for balance.   Ambulation/Gait Ambulation/Gait assistance: +2 physical  assistance;Mod assist Gait Distance (Feet): 3 Feet Assistive device: 2 person hand held assist Gait Pattern/deviations: Step-to pattern;Decreased step length - right;Decreased step length - left;Shuffle;Trunk flexed Gait velocity: decr Gait velocity interpretation: <1.31 ft/sec, indicative of household ambulator General Gait Details: Assist for balance and support. Side stepped up side of bed.  Stairs            Wheelchair Mobility    Modified Rankin (Stroke Patients Only) Modified Rankin (Stroke Patients Only) Pre-Morbid Rankin Score: No symptoms Modified Rankin: Moderately severe disability     Balance Overall balance assessment: Needs assistance Sitting-balance support: Single extremity supported;Feet supported Sitting balance-Leahy Scale: Poor Sitting balance - Comments: UE support and min guard to min assist for static sitting Postural control: Posterior lean Standing balance support: Single extremity supported Standing balance-Leahy Scale: Poor Standing balance comment: Stood x 3 for 20-45 sec with mod assist for static standing                             Pertinent Vitals/Pain Pain Assessment: Faces Faces Pain Scale: Hurts little more Pain Location: generalized Pain Descriptors / Indicators: Grimacing Pain Intervention(s): Limited activity within patient's tolerance;Monitored during session;Repositioned    Home Living Family/patient expects to be discharged to:: Private residence Living Arrangements: Alone   Type of Home: House Home Access: Stairs to enter Entrance Stairs-Rails: Right Entrance Stairs-Number of Steps: 3-4 Home Layout: One level   Additional Comments: Limited info due to pt not verbalizing. Nodding/shaking head appropriately    Prior Function Level of Independence: Independent  Hand Dominance   Dominant Hand: Right    Extremity/Trunk Assessment   Upper Extremity Assessment Upper Extremity Assessment:  Defer to OT evaluation    Lower Extremity Assessment Lower Extremity Assessment: RLE deficits/detail RLE Deficits / Details: hip/knee grossly 4-/5, ankle 1-2/5       Communication   Communication: Expressive difficulties(non verbal, nodding shaking head )  Cognition Arousal/Alertness: Awake/alert Behavior During Therapy: WFL for tasks assessed/performed Overall Cognitive Status: Impaired/Different from baseline Area of Impairment: Orientation;Following commands;Safety/judgement;Problem solving                 Orientation Level: Place;Time;Situation     Following Commands: Follows one step commands with increased time Safety/Judgement: Decreased awareness of safety;Decreased awareness of deficits   Problem Solving: Slow processing;Decreased initiation;Requires verbal cues;Requires tactile cues        General Comments General comments (skin integrity, edema, etc.): Pt with RR in mid 20's with wheezing/stidor at times. SpO2 high 90's throughout. Pt on 4L of O2    Exercises     Assessment/Plan    PT Assessment Patient needs continued PT services  PT Problem List Decreased strength;Decreased activity tolerance;Decreased balance;Decreased mobility;Decreased cognition;Decreased safety awareness;Decreased knowledge of precautions       PT Treatment Interventions DME instruction;Gait training;Functional mobility training;Therapeutic activities;Therapeutic exercise;Balance training;Neuromuscular re-education;Cognitive remediation;Patient/family education    PT Goals (Current goals can be found in the Care Plan section)  Acute Rehab PT Goals Patient Stated Goal: Pt unable to state PT Goal Formulation: With patient Time For Goal Achievement: 02/16/20 Potential to Achieve Goals: Good    Frequency Min 4X/week   Barriers to discharge Decreased caregiver support;Inaccessible home environment Stairs to enter and lives alone    Co-evaluation PT/OT/SLP  Co-Evaluation/Treatment: Yes Reason for Co-Treatment: Complexity of the patient's impairments (multi-system involvement);For patient/therapist safety PT goals addressed during session: Mobility/safety with mobility;Balance         AM-PAC PT "6 Clicks" Mobility  Outcome Measure Help needed turning from your back to your side while in a flat bed without using bedrails?: A Little Help needed moving from lying on your back to sitting on the side of a flat bed without using bedrails?: A Lot Help needed moving to and from a bed to a chair (including a wheelchair)?: A Lot Help needed standing up from a chair using your arms (e.g., wheelchair or bedside chair)?: A Lot Help needed to walk in hospital room?: A Lot Help needed climbing 3-5 steps with a railing? : Total 6 Click Score: 12    End of Session Equipment Utilized During Treatment: Gait belt;Oxygen Activity Tolerance: Patient tolerated treatment well Patient left: in bed;with call bell/phone within reach;with bed alarm set Nurse Communication: Mobility status PT Visit Diagnosis: Other abnormalities of gait and mobility (R26.89);Hemiplegia and hemiparesis Hemiplegia - Right/Left: Right Hemiplegia - dominant/non-dominant: Dominant Hemiplegia - caused by: Cerebral infarction    Time: 1231-1302 PT Time Calculation (min) (ACUTE ONLY): 31 min   Charges:   PT Evaluation $PT Eval Moderate Complexity: [place] Pager [phone] Office [place] 02/02/2020, 2:14 PM

## 2020-02-02 NOTE — Evaluation (Signed)
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 Underwent CABG x 4 on 3/13. On 3/15 pt amb with nursing when she developed rt side weakness and decr responsiveness. 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 Pt taken to IR and found to have lt M1/MCA occlusion and underwent emergent mechanical thrombectomy. Pt extubated 3/18. PMH - CAD, HTN, thyroid CA, osteoporosis, THR   Clinical Impression   Pt admitted with above diagnoses, obtained as much home information as possible considering pt nonverbal and only nodding yes/no at time of session. Pt able to communicate that she lives alone with the home information listed below. At time of eval, pt is mod A +2 for bed mobility and sit <> stands with multimodal cueing to follow sternal precautions. While seated EOB pt engaged in 2 grooming tasks with max A/hand over hand assist. Pt can complete weak flexion in RUE, cannot perform active extension past trace contraction. R inattention also noted. Pt with difficulties with cognition in areas of orientation, awareness, attention, and problem solving. Noted audible respirations throughout session with SpO2 sats stable on 4L [place]. Informed RN. Given current status, recommend CIR at d/c for continued intensive progression of BADL prior to returning home. Will continue to follow per POC listed below.     Follow Up Recommendations  CIR;Supervision/Assistance - 24 hour    Equipment Recommendations  Other (comment)(TBD)    Recommendations for Other Services       Precautions / Restrictions Precautions Precautions: Sternal;Fall Precaution Booklet Issued: No      Mobility Bed Mobility Overal bed mobility: Needs Assistance Bed Mobility: Supine to Sit;Sit to Supine     Supine to sit: +2 for physical assistance;Mod assist;HOB  elevated Sit to supine: +2 for safety/equipment;Mod assist   General bed mobility comments: Assist to bring RLE off of bed, elevate trunk into sitting and bring hips to EOB. Assist to lower trunk and bring RLE up into bed.  Transfers Overall transfer level: Needs assistance Equipment used: 2 person hand held assist Transfers: Sit to/from Stand Sit to Stand: +2 physical assistance;Mod assist         General transfer comment: Assist to bring hips up and for balance.     Balance Overall balance assessment: Needs assistance Sitting-balance support: Single extremity supported;Feet supported Sitting balance-Leahy Scale: Poor Sitting balance - Comments: UE support and min guard to min assist for static sitting Postural control: Posterior lean Standing balance support: Single extremity supported Standing balance-Leahy Scale: Poor Standing balance comment: Stood x 3 for 20-45 sec with mod assist for static standing                           ADL either performed or assessed with clinical judgement   ADL Overall ADL's : Needs assistance/impaired Eating/Feeding: NPO   Grooming: Maximal assistance;Sitting;Wash/dry face;Brushing hair Grooming Details (indicate cue type and reason): hand over hand assist to maintain reaching face Upper Body Bathing: Moderate assistance;Sitting   Lower Body Bathing: Maximal assistance;Sit to/from stand   Upper Body Dressing : Moderate assistance;Sitting   Lower Body Dressing: Maximal assistance;Sit to/from stand   Toilet Transfer: Moderate assistance;+2 for physical assistance;+2 for safety/equipment;Stand-pivot   Toileting- Clothing Manipulation and Hygiene: Maximal assistance;Sit to/from stand       Functional mobility during ADLs: Moderate assistance;+2 for physical assistance;+2  for safety/equipment(side steps up in bed only) General ADL Comments: limited 2/2 respiratory status     Vision Baseline Vision/History: No visual  deficits Patient Visual Report: Other (comment)(unable to report) Vision Assessment?: Vision impaired- to be further tested in functional context Additional Comments: R inattention, difficulty following commands for formal visual assessment     Perception     Praxis      Pertinent Vitals/Pain Pain Assessment: Faces Faces Pain Scale: Hurts little more Pain Location: generalized Pain Descriptors / Indicators: Grimacing Pain Intervention(s): Limited activity within patient's tolerance;Monitored during session;Repositioned     Hand Dominance Right   Extremity/Trunk Assessment Upper Extremity Assessment Upper Extremity Assessment: Generalized weakness;RUE deficits/detail RUE Deficits / Details: inattention, weakness, able to complete hand to mouth pattern ROM but no resistance; difficulty with extension   Lower Extremity Assessment Lower Extremity Assessment: RLE deficits/detail RLE Deficits / Details: hip/knee grossly 4-/5, ankle 1-2/5       Communication Communication Communication: Expressive difficulties   Cognition Arousal/Alertness: Awake/alert Behavior During Therapy: WFL for tasks assessed/performed Overall Cognitive Status: Impaired/Different from baseline Area of Impairment: Orientation;Following commands;Safety/judgement;Problem solving                 Orientation Level: Place;Time;Situation     Following Commands: Follows one step commands with increased time Safety/Judgement: Decreased awareness of safety;Decreased awareness of deficits   Problem Solving: Slow processing;Decreased initiation;Requires verbal cues;Requires tactile cues General Comments: increased time and cueing for safety   General Comments  Pt with RR in mid 20's with wheezing/stidor at times. SpO2 high 90's throughout. Pt on 4L of O2    Exercises     Shoulder Instructions      Home Living Family/patient expects to be discharged to:: Private residence Living Arrangements: Alone    Type of Home: House Home Access: Stairs to enter [employer] of Steps: 3-4 Entrance Stairs-Rails: Right Home Layout: One level                   Additional Comments: Limited info due to pt not verbalizing. Nodding/shaking head appropriately      Prior Functioning/Environment Level of Independence: Independent                 OT Problem List: Decreased strength;Impaired vision/perception;Decreased knowledge of use of DME or AE;Decreased coordination;Decreased knowledge of precautions;Decreased activity tolerance;Decreased cognition;Cardiopulmonary status limiting activity;Impaired UE functional use;Impaired balance (sitting and/or standing);Decreased safety awareness;Pain;Impaired sensation      OT Treatment/Interventions: Self-care/ADL training;Visual/perceptual remediation/compensation;Therapeutic exercise;Patient/family education;Balance training;Neuromuscular education;Energy conservation;Therapeutic activities;DME and/or AE instruction;Cognitive remediation/compensation    OT Goals(Current goals can be found in the care plan section) Acute Rehab OT Goals Patient Stated Goal: Pt unable to state OT Goal Formulation: Patient unable to participate in goal setting Time For Goal Achievement: 02/16/20 Potential to Achieve Goals: Good  OT Frequency: Min 3X/week   Barriers to D/C:            Co-evaluation PT/OT/SLP Co-Evaluation/Treatment: Yes Reason for Co-Treatment: Complexity of the patient's impairments (multi-system involvement);For patient/therapist safety PT goals addressed during session: Mobility/safety with mobility OT goals addressed during session: ADL's and self-care      AM-PAC OT "6 Clicks" Daily Activity     Outcome Measure Help from another person eating meals?: Total(NPO) Help from another person taking care of personal grooming?: A Lot Help from another person toileting, which includes using toliet, bedpan, or urinal?: A Lot Help from  another person bathing (including washing, rinsing, drying)?: A Lot Help from another person to  put on and taking off regular upper body clothing?: A Lot Help from another person to put on and taking off regular lower body clothing?: A Lot 6 Click Score: 11   End of Session Nurse Communication: Mobility status  Activity Tolerance: Patient limited by fatigue Patient left: in bed;with call bell/phone within reach;with bed alarm set  OT Visit Diagnosis: Unsteadiness on feet (R26.81);Other abnormalities of gait and mobility (R26.89);Muscle weakness (generalized) (M62.81);Hemiplegia and hemiparesis Hemiplegia - Right/Left: Right Hemiplegia - dominant/non-dominant: Dominant Hemiplegia - caused by: Cerebral infarction                Time: 1231-1301 OT Time Calculation (min): 30 min Charges:  OT General Charges $OT Visit: 1 Visit OT Evaluation $OT Eval High Complexity: 1 High  Zenovia Jarred, MSOT, OTR/L Acute Rehabilitation Services [facility] Office Number: [phone] Pager: [phone]  Zenovia Jarred 02/02/2020, 5:17 PM

## 2020-02-03 ENCOUNTER — Inpatient Hospital Stay (HOSPITAL_COMMUNITY): Payer: PPO

## 2020-02-03 DIAGNOSIS — J9601 Acute respiratory failure with hypoxia: Secondary | ICD-10-CM

## 2020-02-03 DIAGNOSIS — Z930 Tracheostomy status: Secondary | ICD-10-CM

## 2020-02-03 LAB — BASIC METABOLIC PANEL
Anion gap: 9 (ref 5–15)
BUN: 21 mg/dL (ref 8–23)
CO2: 24 mmol/L (ref 22–32)
Calcium: 9.9 mg/dL (ref 8.9–10.3)
Chloride: 109 mmol/L (ref 98–111)
Creatinine, Ser: 0.69 mg/dL (ref 0.44–1.00)
GFR calc Af Amer: >60 mL/min (ref >60)
GFR calc non Af Amer: >60 mL/min (ref >60)
Glucose, Bld: 177 mg/dL — ABNORMAL HIGH (ref 70–99)
Potassium: 4.5 mmol/L (ref 3.5–5.1)
Sodium: 142 mmol/L (ref 135–145)

## 2020-02-03 LAB — CBC
HCT: 28.1 % — ABNORMAL LOW (ref 36.0–46.0)
Hemoglobin: 9 g/dL — ABNORMAL LOW (ref 12.0–15.0)
MCH: 30 pg (ref 26.0–34.0)
MCHC: 32 g/dL (ref 30.0–36.0)
MCV: 93.7 fL (ref 80.0–100.0)
Platelets: 194 10*3/uL (ref 150–400)
RBC: 3 MIL/uL — ABNORMAL LOW (ref 3.87–5.11)
RDW: 14.6 % (ref 11.5–15.5)
WBC: 6.9 10*3/uL (ref 4.0–10.5)
nRBC: 0 % (ref 0.0–0.2)

## 2020-02-03 LAB — PHOSPHORUS: Phosphorus: 3.1 mg/dL (ref 2.5–4.6)

## 2020-02-03 LAB — GLUCOSE, CAPILLARY
Glucose-Capillary: 115 mg/dL — ABNORMAL HIGH (ref 70–99)
Glucose-Capillary: 118 mg/dL — ABNORMAL HIGH (ref 70–99)
Glucose-Capillary: 143 mg/dL — ABNORMAL HIGH (ref 70–99)
Glucose-Capillary: 147 mg/dL — ABNORMAL HIGH (ref 70–99)
Glucose-Capillary: 155 mg/dL — ABNORMAL HIGH (ref 70–99)
Glucose-Capillary: 169 mg/dL — ABNORMAL HIGH (ref 70–99)

## 2020-02-03 LAB — MAGNESIUM: Magnesium: 1.7 mg/dL (ref 1.7–2.4)

## 2020-02-03 LAB — HEPARIN LEVEL (UNFRACTIONATED): Heparin Unfractionated: 0.37 IU/mL (ref 0.30–0.70)

## 2020-02-03 MED ORDER — VECURONIUM BROMIDE 10 MG IV SOLR
10.0000 mg | Freq: Once | INTRAVENOUS | Status: AC
  Administered 2020-02-03: 10 mg via INTRAVENOUS
  Filled 2020-02-03: qty 10

## 2020-02-03 MED ORDER — MAGNESIUM SULFATE 2 GM/50ML IV SOLN
2.0000 g | Freq: Once | INTRAVENOUS | Status: AC
  Administered 2020-02-03: 2 g via INTRAVENOUS
  Filled 2020-02-03: qty 50

## 2020-02-03 MED ORDER — ETOMIDATE 2 MG/ML IV SOLN
20.0000 mg | Freq: Once | INTRAVENOUS | Status: AC
  Administered 2020-02-03: 20 mg via INTRAVENOUS
  Filled 2020-02-03: qty 10

## 2020-02-03 MED ORDER — MIDAZOLAM HCL 2 MG/2ML IJ SOLN
5.0000 mg | Freq: Once | INTRAMUSCULAR | Status: AC
  Administered 2020-02-03: 2 mg via INTRAVENOUS
  Filled 2020-02-03: qty 6

## 2020-02-03 MED ORDER — POTASSIUM CHLORIDE 10 MEQ/50ML IV SOLN: 10.0000 meq | INTRAVENOUS | Status: DC

## 2020-02-03 MED ORDER — FENTANYL CITRATE (PF) 100 MCG/2ML IJ SOLN
200.0000 ug | Freq: Once | INTRAMUSCULAR | Status: AC
  Administered 2020-02-03: 50 ug via INTRAVENOUS
  Filled 2020-02-03: qty 4

## 2020-02-03 NOTE — Procedures (Signed)
Critical care bedside tracheostomy procedure note  Procedure indication:  79 year old woman status post CABG.  Developed stroke.  Status post thrombectomy.  Urgent reintubation for upper airway stridor.  Significant upper airway edema.  Given difficulty of airway have opted for early tracheostomy..   Procedure description:  A preprocedural timeout was performed.  Patient received fentanyl 50 mcg, Versed 2 mg as premedication.  Patient was anesthetized with 20 mg of etomidate and received 10 mg of vecuronium.  A fiberoptic flexible bronchoscope was inserted through the endotracheal tube.  The patient was prepped with chlorhexidine and draped in the usual fashion.  The tracheal rings were identified by palpation and a midline incision was extended from the suprasternal notch to the base of the cricoid cartilage over approximately 2 cm.  Blunt dissection was then performed down to the pretracheal aponeurosis.  Light from the bronchoscope was identified at the bottom of the incision.  The introducer needle was inserted and was visualized on the sidewall of the trachea and Reep was repositioned back to midline.  The guidewire was passed through the introducer under direct bronchoscopic visualization.  A one-step dilation was performed to the block skin mark.  A #6 cuffed Shiley tracheostomy was loaded on the the introducer and inserted under direct visualization.  The inner cannula was then inserted and the Ronca scope was brought across to the tracheostomy and confirmed position within the trachea.  The tracheostomy was then secured using 2 stitches on either side.  Skin the protective dressing was placed on the underside of the tracheostomy as per protocol.  There was negligible blood loss.  The patient remained hemodynamically stable throughout.  There was no desaturation.  Procedure was well-tolerated.  Dr. Ina Homes proctored this procedure and was present throughout.  Kipp Brood, MD  [facility] ICU Physician [place]  Pager: [phone] Mobile: [phone] After hours: [phone].  02/03/2020, 4:40 PM

## 2020-02-03 NOTE — Progress Notes (Signed)
ANTICOAGULATION CONSULT NOTE - [place] for Heparin Indication: pulmonary embolus, stroke and DVT, and recent CABG  Allergies  Allergen Reactions  . Bee Venom Swelling    Massive swelling  . Penicillins Other (See Comments)    UNSPECIFIED REACTION  Did it involve swelling of the face/tongue/throat, SOB, or low BP? Unknown Did it involve sudden or severe rash/hives, skin peeling, or any reaction on the inside of your mouth or nose? Unknown Did you need to seek medical attention at a hospital or doctor's office? Unknown When did it last happen?1945 If all above answers are "NO", may proceed with cephalosporin use.  . Adhesive [Tape] Rash  . Morphine And Related Rash  . Sulfa Antibiotics Nausea And Vomiting and Rash    Patient Measurements: Height: 5\' 3"  (160 cm) Weight: 151 lb 7.3 oz (68.7 kg) IBW/kg (Calculated) : 52.4 Heparin Dosing Weight: 65.3 kg  Vital Signs: Temp: 98.9 F (37.2 C) (03/19 1200) Temp Source: Axillary (03/19 1200) BP: 151/65 (03/19 1500) Pulse Rate: 72 (03/19 1500)  Labs: Recent Labs    02/01/20 0349 02/01/20 0349 02/02/20 0411 02/02/20 0411 02/02/20 1317 02/02/20 1735 02/03/20 0241  HGB 9.9*   < > 9.8*   < >  --  9.5* 9.0*  HCT 30.8*   < > 30.3*  --   --  28.0* 28.1*  PLT 199  --  213  --   --   --  194  HEPARINUNFRC 0.36   < > 0.26*  --  0.34  --  0.37  CREATININE 0.76  --  0.65  --   --   --  0.69   < > = values in this interval not displayed.    Estimated Creatinine Clearance: 55.6 mL/min (by C-G formula based on SCr of 0.69 mg/dL).  Medications:  Plavix is being held as changing to full anticoagulation with IV Heparin  Assessment: 79 year old female admitted with angina on 3/11 and s/p CABG on 3/12 who had acute stroke symptoms on 3/15 AM. CTA of Head and Neck showed an acute MCA infarct and bilateral subsegmental acute pulmonary emboli. She is now s/p IR guided mechanical thrombectomy and Pharmacy has  been consulted to start IV Heparin therapy. 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   She is now s/p trach and heparin to restart at 6:30pm  Goal of Therapy:  Heparin level 0.3 to 0.5 units/ml Monitor platelets by anticoagulation protocol: Yes   Plan:  -restart heparin at 850 units/hr at 6:30pm -Heparin level in 6 hours and daily wth CBC daily  Hildred Laser, PharmD Clinical Pharmacist **Pharmacist phone directory can now be found on [email or web address] ([email or web address]).  Listed under [place].

## 2020-02-03 NOTE — Progress Notes (Signed)
EVENING ROUNDS NOTE :     [address]       [place], [address]             [phone]                 4 Days Post-Op Procedure(s) (LRB): IR WITH ANESTHESIA (N/A)   Total Length of Stay:  LOS: 8 days  Events:  Bedside trach performed No events    BP (!) 151/65   Pulse 78   Temp 98.9 F (37.2 C) (Axillary)   Resp 17   Ht 5\' 3"  (1.6 m)   Wt 68.7 kg   SpO2 97%   BMI 26.83 kg/m      Vent Mode: PRVC FiO2 (%):  [40 %] 40 % Set Rate:  [15 bmp] 15 bmp Vt Set:  [420 mL] 420 mL PEEP:  [5 cmH20] 5 cmH20 Pressure Support:  [8 cmH20] 8 cmH20 Plateau Pressure:  [13 cmH20-20 cmH20] 20 cmH20  . sodium chloride    . sodium chloride 20 mL/hr at 02/03/20 1600  . feeding supplement (VITAL AF 1.2 CAL) 45 mL/hr at 02/02/20 1900  . heparin Stopped (02/03/20 1214)  . insulin Stopped (01/28/20 1543)  . lactated ringers    . levETIRAcetam Stopped (02/03/20 0906)    I/O last 3 completed shifts: In: 3014.3 [I.V.:901.1; NG/GT:1736.5; IV Piggyback:376.7] Out: [id number] [Urine:3460; Emesis/NG output:62; Chest Tube:330]   CBC Latest Ref Rng & Units 02/03/2020 02/02/2020 02/02/2020  WBC 4.0 - 10.5 K/uL 6.9 - 12.9(H)  Hemoglobin 12.0 - 15.0 g/dL 9.0(L) 9.5(L) 9.8(L)  Hematocrit 36.0 - 46.0 % 28.1(L) 28.0(L) 30.3(L)  Platelets 150 - 400 K/uL 194 - 213    BMP Latest Ref Rng & Units 02/03/2020 02/02/2020 02/02/2020  Glucose 70 - 99 mg/dL 177(H) - 136(H)  BUN 8 - 23 mg/dL 21 - 15  Creatinine 0.44 - 1.00 mg/dL 0.69 - 0.65  BUN/Creat Ratio 12 - 28 - - -  Sodium 135 - 145 mmol/L 142 143 144  Potassium 3.5 - 5.1 mmol/L 4.5 3.9 3.4(L)  Chloride 98 - 111 mmol/L 109 - 111  CO2 22 - 32 mmol/L 24 - 25  Calcium 8.9 - 10.3 mg/dL 9.9 - 9.5    ABG    Component Value Date/Time   PHART 7.427 02/02/2020 1735   PCO2ART 43.3 02/02/2020 1735   PO2ART 301.0 (H) 02/02/2020 1735   HCO3 28.5 (H) 02/02/2020 1735   TCO2 30 02/02/2020 1735   ACIDBASEDEF 2.0 01/28/2020 1545   O2SAT 100.0  02/02/2020 1735       Melodie Bouillon, MD 02/03/2020 5:29 PM

## 2020-02-03 NOTE — Progress Notes (Signed)
ANTICOAGULATION CONSULT NOTE - [place] for Heparin Indication: pulmonary embolus, stroke and DVT, and recent CABG  Allergies  Allergen Reactions  . Bee Venom Swelling    Massive swelling  . Penicillins Other (See Comments)    UNSPECIFIED REACTION  Did it involve swelling of the face/tongue/throat, SOB, or low BP? Unknown Did it involve sudden or severe rash/hives, skin peeling, or any reaction on the inside of your mouth or nose? Unknown Did you need to seek medical attention at a hospital or doctor's office? Unknown When did it last happen?1945 If all above answers are "NO", may proceed with cephalosporin use.  . Adhesive [Tape] Rash  . Morphine And Related Rash  . Sulfa Antibiotics Nausea And Vomiting and Rash    Patient Measurements: Height: 5\' 3"  (160 cm) Weight: 151 lb 7.3 oz (68.7 kg) IBW/kg (Calculated) : 52.4 Heparin Dosing Weight: 65.3 kg  Vital Signs: Temp: 98.9 F (37.2 C) (03/19 1200) Temp Source: Axillary (03/19 1200) BP: 137/64 (03/19 1151) Pulse Rate: 71 (03/19 1151)  Labs: Recent Labs    02/01/20 0349 02/01/20 0349 02/02/20 0411 02/02/20 0411 02/02/20 1317 02/02/20 1735 02/03/20 0241  HGB 9.9*   < > 9.8*   < >  --  9.5* 9.0*  HCT 30.8*   < > 30.3*  --   --  28.0* 28.1*  PLT 199  --  213  --   --   --  194  HEPARINUNFRC 0.36   < > 0.26*  --  0.34  --  0.37  CREATININE 0.76  --  0.65  --   --   --  0.69   < > = values in this interval not displayed.    Estimated Creatinine Clearance: 55.6 mL/min (by C-G formula based on SCr of 0.69 mg/dL).  Medications:  Plavix is being held as changing to full anticoagulation with IV Heparin  Assessment: 79 year old female admitted with angina on 3/11 and s/p CABG on 3/12 who had acute stroke symptoms on 3/15 AM. CTA of Head and Neck showed an acute MCA infarct and bilateral subsegmental acute pulmonary emboli. She is now s/p IR guided mechanical thrombectomy this AM and  Pharmacy has been consulted to start IV Heparin therapy. 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 D-dimer was 1.25, fibrinogen 652. INR was normal at 1.1  Heparin level 0.37 is therapeutic on heparin gtt at 850 units/hr. No issues with line or bleeding reported per RN.  Goal of Therapy:  Heparin level 0.3 to 0.5 units/ml Monitor platelets by anticoagulation protocol: Yes   Plan:  - Continue  heparin infusion to 850 units/hr Plan to hold for trach placement this afternoon - will follow up restart after -daily [place].D. CPP, BCPS Clinical Pharmacist [phone] 02/03/2020 1:43 PM

## 2020-02-03 NOTE — Progress Notes (Signed)
PT Cancellation Note  Patient Details Name: [name] [name] MRN: [id number] DOB: [birth year]   Cancelled Treatment:    Reason Eval/Treat Not Completed: Patient not medically ready PT held for today. Pt re intubated 3/18. PT will continue to follow acutely.    Earney Navy, PTA Acute Rehabilitation Services Pager: [phone] Office: [phone]   02/03/2020, 8:41 AM

## 2020-02-03 NOTE — Progress Notes (Signed)
Referring Physician(s): Dr. Rory Percy  Supervising Physician: Dr. Karenann Cai  Patient Status:  [name] [name] - In-pt  Chief Complaint: Code Stroke  Subjective: Re-intubated yesterday due to lethargy. Plans for trach placement today.  Neuro exam appears stable.  Brother at bedside.   Allergies: Bee venom, Penicillins, Adhesive [tape], Morphine and related, and Sulfa antibiotics  Medications: Prior to Admission medications   Medication Sig Start Date End Date Taking? Authorizing Provider  acetaminophen (TYLENOL) 500 MG tablet Take 1,000 mg by mouth in the morning and at bedtime.    Yes [provider]  aspirin EC 81 MG tablet Take 81 mg by mouth at bedtime.   Yes [provider]  atorvastatin (LIPITOR) 80 MG tablet Take 80 mg by mouth daily at 12 noon.    Yes [provider]  cholecalciferol (VITAMIN D) 1000 UNITS tablet Take 1,000 Units by mouth daily at 12 noon.    Yes [provider]  fluticasone (FLONASE) 50 MCG/ACT nasal spray Place 2 sprays into both nostrils daily. 12/19/19  Yes [provider]  levothyroxine (SYNTHROID, LEVOTHROID) 200 MCG tablet Take 100-200 mcg by mouth See admin instructions. Take 200 mcg  SUN-TUES-THUR-SAT Take 100 mcg tab MON-WED-FRI  DO NOT USE GENERIC   Yes [provider]  lisinopril (ZESTRIL) 20 MG tablet Take 20 mg by mouth daily.    Yes [provider]  metoprolol tartrate (LOPRESSOR) 25 MG tablet Take 1 tablet (25 mg total) by mouth 2 (two) times daily. Patient taking differently: Take 25 mg by mouth 2 (two) times daily after a meal.  01/04/20 04/03/20 Yes [name], [name], MD  nitroGLYCERIN (NITROSTAT) 0.4 MG SL tablet Place 1 tablet (0.4 mg total) under the tongue every 5 (five) minutes as needed for chest pain. 11/22/19 02/20/20 Yes [name], [name], MD  Prenatal Vit-Fe Fumarate-FA (PRENATAL MULTIVITAMIN) TABS tablet Take 1 tablet by mouth daily at 12 noon.   Yes [provider]  vitamin B-12 (CYANOCOBALAMIN) 1000 MCG tablet Take 2,500 mcg by mouth daily.   Yes [provider]     Vital Signs: BP (!) 150/65   Pulse 73   Temp 98 F (36.7 C) (Oral)   Resp 19   Ht 5\' 3"  (1.6 m)   Wt 151 lb 7.3 oz (68.7 kg)   SpO2 100%   BMI 26.83 kg/m   Physical Exam  Somnolent, not on sedation, awakens with agitation/pain Intubated. Neuro:  Awakens, but little interaction.  Nods to recognition of her brother.  Withdrawals both legs to pain, but does not follow commands to move on her own. RUE edematous, decreased hand grip strength.  Moving left upper arm without issue.  Imaging: CT HEAD WO CONTRAST  Result Date: 01/31/2020 CLINICAL DATA:  Post left M1 thrombectomy EXAM: CT HEAD WITHOUT CONTRAST TECHNIQUE: Contiguous axial images were obtained from the base of the skull through the vertex without intravenous contrast. COMPARISON:  Earlier same day FINDINGS: Brain: There is diminished visualization the left lentiform nucleus. Stable potential small area of cortical hypoattenuation in the left frontal lobe, which could reflect volume averaging. There is no acute intracranial hemorrhage or mass effect. Ventricles are stable in size. Vascular: No new findings. Skull: Calvarium is unremarkable. Sinuses/Orbits: No acute finding. Other: None. IMPRESSION: No acute intracranial hemorrhage. Likely evolving recent infarction of the lentiform nucleus. Electronically Signed   By: Macy Mis M.D.   On: 01/31/2020 12:06   CT HEAD WO CONTRAST  Result Date: 01/31/2020 CLINICAL  DATA:  Status post revascularization of left M1 occlusion. EXAM: CT HEAD WITHOUT CONTRAST TECHNIQUE: Contiguous axial images were obtained from the base of the skull through the vertex without intravenous contrast. COMPARISON:  CT head without contrast 01/30/2020 FINDINGS: Brain: Small nonhemorrhagic cortical infarct is present in the inferior left frontal gyrus on axial image 24 and coronal image  31. No other acute or focal cortical infarct is present. Precentral gyrus is intact. Mild white matter changes are again seen. The left lentiform nucleus is less well-defined than on the right. Insular cortex is intact. Caudate lobe is normal. Internal capsule is normal. No hemorrhage is present. The ventricles are of normal size. No significant extraaxial fluid collection is present. The brainstem and cerebellum are within normal limits. Vascular: Atherosclerotic calcifications are present within the cavernous internal carotid arteries and at the dural margin of both vertebral arteries. No hyperdense vessel is present. Skull: Calvarium is intact. No focal lytic or blastic lesions are present. No significant extracranial soft tissue lesion is present. Sinuses/Orbits: The paranasal sinuses and mastoid air cells are clear. The globes and orbits are within normal limits. IMPRESSION: 1. No significant hemorrhage following revascularization of left M1 occlusion. 2. Small cortical infarct in the anterior left frontal lobe. 3. Question ischemic infarct of the left lentiform nucleus, less well seen than on the right. 4. No other significant cortical or basal ganglia infarct. Electronically Signed   By: San Morelle M.D.   On: 01/31/2020 07:20   DG CHEST PORT 1 VIEW  Result Date: 02/03/2020 CLINICAL DATA:  Acute hypoxic respiratory failure EXAM: PORTABLE CHEST 1 VIEW COMPARISON:  Yesterday FINDINGS: Endotracheal tube tip is just below the clavicular heads. The feeding tube at least reaches the stomach. Right PICC with tip at the upper cavoatrial junction. Cardiomegaly. Elevated right diaphragm with basal atelectasis. Chest tubes in place. No visible pneumothorax. IMPRESSION: Stable hardware positioning and right base atelectasis. Electronically Signed   By: Monte Fantasia M.D.   On: 02/03/2020 09:35   DG CHEST PORT 1 VIEW  Result Date: 02/02/2020 CLINICAL DATA:  Endotracheal tube placement. EXAM: PORTABLE  CHEST 1 VIEW COMPARISON:  02/02/2020 FINDINGS: Endotracheal tube tip projects 2.5 cm above the carina, well positioned and unchanged. Right internal jugular introducer sees, right internal jugular central venous line are both stable from the prior exam. Enteric tube passes well below the diaphragm least into the distal stomach. New right PICC, tip projecting in the lower superior vena cava. Left chest tube is stable. Cardiac silhouette is mildly enlarged. There is lung base opacity, greater on the right, consistent with a combination of small pleural effusions and atelectasis. Consider pneumonia if there are consistent clinical findings. No evidence of pulmonary edema. No pneumothorax. IMPRESSION: 1. Right PICC placed since the study obtained earlier today, tip projecting in the lower superior vena cava. 2. No other change. Other support apparatus is stable and well positioned. 3. Persistent lung base opacity, greater on the right, consistent with a combination of small effusions and atelectasis. Pneumonia is not excluded. Electronically Signed   By: Lajean Manes M.D.   On: 02/02/2020 16:58   DG CHEST PORT 1 VIEW  Result Date: 02/02/2020 CLINICAL DATA:  Chest tube present. EXAM: PORTABLE CHEST 1 VIEW COMPARISON:  February 01, 2020. FINDINGS: Stable cardiomegaly. Endotracheal and feeding tubes are in grossly good position. Bilateral chest tubes are noted without pneumothorax. Left lung is clear. Mild right basilar subsegmental atelectasis is noted with small right pleural effusion. Bony thorax is unremarkable.  Stable position of right internal jugular catheter. IMPRESSION: Stable support apparatus. Bilateral chest tubes are noted without pneumothorax. Mild right basilar subsegmental atelectasis is noted with small right pleural effusion. Electronically Signed   By: Marijo Conception M.D.   On: 02/02/2020 08:37   DG CHEST PORT 1 VIEW  Result Date: 02/01/2020 CLINICAL DATA:  Intubated patient status post left  frontal and basal ganglia infarct. EXAM: PORTABLE CHEST 1 VIEW COMPARISON:  Single-view of the chest 01/30/2020 and 01/29/2020. FINDINGS: Endotracheal tube remains in place with the tip 1.6 cm above the carina. Right IJ central venous catheters, OG tube, mediastinal drain and bilateral chest tubes are again seen. Small to moderate pleural effusions are present, greater on the right, with associated basilar atelectasis. No pneumothorax. Heart size is upper normal. Atherosclerosis noted. IMPRESSION: Support tubes and lines project in good position.  No pneumothorax. No notable change in a small to moderate right pleural effusion and basilar atelectasis. Very small left pleural effusion noted. Electronically Signed   By: Inge Rise M.D.   On: 02/01/2020 09:50   EEG adult  Result Date: 01/31/2020 Lora Havens, MD     01/31/2020  9:42 AM Patient Name: [name] [name] MRN: [id number] Epilepsy Attending: Lora Havens Referring Physician/Provider: Dr Rosalin Hawking Date: 01/31/2020 Duration: 24.48 mins Patient history: 79yo F with left MCA infarct continues to be lethargic. EEG to evaluate for seizure. Level of alertness: lethargic AEDs during EEG study: LEV Technical aspects: This EEG study was done with scalp electrodes positioned according to the 10-20 International system of electrode placement. Electrical activity was acquired at a sampling rate of 500Hz  and reviewed with a high frequency filter of 70Hz  and a low frequency filter of 1Hz . EEG data were recorded continuously and digitally stored. DESCRIPTION: EEG showed continuous generalized and lateralized left hemisphere 3-6hz  theta-delta slowing. Sharp waves were seen in left frontotemporal region, maximal T7. Hyperventilation and photic stimulation were not performed. ABNORMALITY - Continuous slow, generalized and lateralized left - Sharp waves, left frontotemporal region, maximal T7 IMPRESSION: This study showed evidence of epileptogenicity arising  from left frontotemporal as well as cortical dysfunction in left hemisphere likely secondary to underlying stroke. No seizures were seen throughout the recording. Lora Havens   ECHOCARDIOGRAM COMPLETE  Result Date: 01/30/2020    ECHOCARDIOGRAM REPORT   Patient Name:   [name] [name] Date of Exam: 01/30/2020 Medical Rec #:  [id number]          Height:       63.0 in Accession #:    [id number]         Weight:       156.1 lb Date of Birth:  [birth year]          BSA:          1.740 m Patient Age:    6 years           BP:           133/64 mmHg Patient Gender: F                  HR:           67 bpm. Exam Location:  Inpatient Procedure: 2D Echo Indications:    Stroke 434.91 / I163.9  History:        Patient has prior history of Echocardiogram examinations. CAD,                 Prior CABG; Risk Factors:Hypertension and  Dyslipidemia. External                 pacemaker.  Sonographer:    Darlina Sicilian RDCS Referring Phys: [id number] ASHISH ARORA IMPRESSIONS  1. Left ventricular ejection fraction, by estimation, is 30 to 35%. The left ventricle has moderately decreased function. The left ventricle demonstrates global hypokinesis. There is mild asymmetric left ventricular hypertrophy of the septal segment. Left ventricular diastolic parameters are consistent with Grade I diastolic dysfunction (impaired relaxation). Elevated left ventricular end-diastolic pressure.  2. Right ventricular systolic function is normal. The right ventricular size is normal. There is mildly elevated pulmonary artery systolic pressure.  3. Left atrial size was mildly dilated.  4. The mitral valve is normal in structure. Trivial mitral valve regurgitation. No evidence of mitral stenosis.  5. The aortic valve is normal in structure. Aortic valve regurgitation is not visualized. No aortic stenosis is present.  6. There is mild dilatation of the ascending aorta measuring 38 mm.  7. The inferior vena cava is normal in size with greater than 50%  respiratory variability, suggesting right atrial pressure of 3 mmHg. FINDINGS  Left Ventricle: Left ventricular ejection fraction, by estimation, is 30 to 35%. The left ventricle has moderately decreased function. The left ventricle demonstrates global hypokinesis. The left ventricular internal cavity size was normal in size. There is mild asymmetric left ventricular hypertrophy of the septal segment. Left ventricular diastolic parameters are consistent with Grade I diastolic dysfunction (impaired relaxation). Elevated left ventricular end-diastolic pressure. Right Ventricle: The right ventricular size is normal. No increase in right ventricular wall thickness. Right ventricular systolic function is normal. There is mildly elevated pulmonary artery systolic pressure. The tricuspid regurgitant velocity is 2.40  m/s, and with an assumed right atrial pressure of 15 mmHg, the estimated right ventricular systolic pressure is 99991111 mmHg. Left Atrium: Left atrial size was mildly dilated. Right Atrium: Right atrial size was normal in size. Pericardium: There is no evidence of pericardial effusion. Mitral Valve: The mitral valve is normal in structure. Normal mobility of the mitral valve leaflets. Trivial mitral valve regurgitation. No evidence of mitral valve stenosis. Tricuspid Valve: The tricuspid valve is normal in structure. Tricuspid valve regurgitation is mild . No evidence of tricuspid stenosis. Aortic Valve: The aortic valve is normal in structure. Aortic valve regurgitation is not visualized. No aortic stenosis is present. Pulmonic Valve: The pulmonic valve was normal in structure. Pulmonic valve regurgitation is mild. No evidence of pulmonic stenosis. Aorta: The aortic root is normal in size and structure. There is mild dilatation of the ascending aorta measuring 38 mm. Venous: The inferior vena cava is normal in size with greater than 50% respiratory variability, suggesting right atrial pressure of 3 mmHg.  IAS/Shunts: No atrial level shunt detected by color flow Doppler.  LEFT VENTRICLE PLAX 2D LVIDd:         3.60 cm     Diastology LVIDs:         2.40 cm     LV e' lateral:   4.91 cm/s LV PW:         1.00 cm     LV E/e' lateral: 14.3 LV IVS:        1.10 cm     LV e' medial:    3.59 cm/s                            LV E/e' medial:  19.6  LV Volumes (MOD)  LV vol d, MOD A2C: 69.0 ml LV vol d, MOD A4C: 88.7 ml LV vol s, MOD A2C: 48.3 ml LV vol s, MOD A4C: 57.1 ml LV SV MOD A2C:     20.7 ml LV SV MOD A4C:     88.7 ml LV SV MOD BP:      26.0 ml LEFT ATRIUM             Index       RIGHT ATRIUM           Index LA diam:        3.40 cm 1.95 cm/m  RA Area:     11.10 cm LA Vol (A2C):   32.3 ml 18.56 ml/m RA Volume:   21.20 ml  12.18 ml/m LA Vol (A4C):   55.4 ml 31.83 ml/m LA Biplane Vol: 43.2 ml 24.82 ml/m  AORTIC VALVE LVOT Vmax:   71.10 cm/s LVOT Vmean:  47.400 cm/s LVOT VTI:    0.141 m  AORTA Ao Root diam: 2.60 cm Ao Asc diam:  3.80 cm MITRAL VALVE                TRICUSPID VALVE MV Area (PHT): 3.53 cm     TR Peak grad:   23.0 mmHg MV Decel Time: 215 msec     TR Vmax:        240.00 cm/s MV E velocity: 70.30 cm/s MV A velocity: 104.00 cm/s  SHUNTS MV E/A ratio:  0.68         Systemic VTI: 0.14 m Skeet Latch MD Electronically signed by Skeet Latch MD Signature Date/Time: 01/30/2020/5:25:49 PM    Final    VAS [place] LOWER EXTREMITY VENOUS (DVT)  Result Date: 01/30/2020  Lower Venous DVTStudy Indications: Pulmonary embolism.  Risk Factors: Confirmed PE. Limitations: Poor ultrasound/tissue interface and patient positioning, patient immobility. Comparison Study: No prior studies. Performing Technologist: Oliver Hum RVT  Examination Guidelines: A complete evaluation includes B-mode imaging, spectral Doppler, color Doppler, and power Doppler as needed of all accessible portions of each vessel. Bilateral testing is considered an integral part of a complete examination. Limited examinations for reoccurring indications  may be performed as noted. The reflux portion of the exam is performed with the patient in reverse Trendelenburg.  +---------+---------------+---------+-----------+----------+--------------+ RIGHT    CompressibilityPhasicitySpontaneityPropertiesThrombus Aging +---------+---------------+---------+-----------+----------+--------------+ CFV      Full           Yes      Yes                                 +---------+---------------+---------+-----------+----------+--------------+ SFJ      Full                                                        +---------+---------------+---------+-----------+----------+--------------+ FV Prox  Full                                                        +---------+---------------+---------+-----------+----------+--------------+ FV Mid   Full                                                        +---------+---------------+---------+-----------+----------+--------------+  FV DistalFull                                                        +---------+---------------+---------+-----------+----------+--------------+ PFV      Full                                                        +---------+---------------+---------+-----------+----------+--------------+ POP      Full           Yes      Yes                                 +---------+---------------+---------+-----------+----------+--------------+ PTV      Full                                                        +---------+---------------+---------+-----------+----------+--------------+ PERO     Full                                                        +---------+---------------+---------+-----------+----------+--------------+   +---------+---------------+---------+-----------+----------+--------------+ LEFT     CompressibilityPhasicitySpontaneityPropertiesThrombus Aging +---------+---------------+---------+-----------+----------+--------------+ CFV       Full           Yes      Yes                                 +---------+---------------+---------+-----------+----------+--------------+ SFJ      Full                                                        +---------+---------------+---------+-----------+----------+--------------+ FV Prox  Full                                                        +---------+---------------+---------+-----------+----------+--------------+ FV Mid   Full                                                        +---------+---------------+---------+-----------+----------+--------------+ FV DistalFull                                                        +---------+---------------+---------+-----------+----------+--------------+  PFV      Full                                                        +---------+---------------+---------+-----------+----------+--------------+ POP      Full           Yes      Yes                                 +---------+---------------+---------+-----------+----------+--------------+ PTV      Partial                                      Acute          +---------+---------------+---------+-----------+----------+--------------+ PERO     Full                                                        +---------+---------------+---------+-----------+----------+--------------+     Summary: RIGHT: - There is no evidence of deep vein thrombosis in the lower extremity.  - No cystic structure found in the popliteal fossa.  LEFT: - Findings consistent with acute deep vein thrombosis involving the left posterior tibial veins. - No cystic structure found in the popliteal fossa.  *See table(s) above for measurements and observations. Electronically signed by Ruta Hinds MD on 01/30/2020 at 7:04:03 PM.    Final    [place] EKG SITE RITE  Result Date: 02/02/2020 If Site Rite image not attached, placement could not be confirmed due to current cardiac  rhythm.   Labs:  CBC: Recent Labs    01/31/20 0550 01/31/20 0559 02/01/20 0349 02/02/20 0411 02/02/20 1735 02/03/20 0241  WBC 11.4*  --  10.6* 12.9*  --  6.9  HGB 9.6*   < > 9.9* 9.8* 9.5* 9.0*  HCT 29.2*   < > 30.8* 30.3* 28.0* 28.1*  PLT 182  --  199 213  --  194   < > = values in this interval not displayed.    COAGS: Recent Labs    01/27/20 0524 01/27/20 2048 01/28/20 0625 01/30/20 0955  INR 1.0 1.5* 1.1 1.1  APTT 76* 33 30  --     BMP: Recent Labs    01/31/20 0550 01/31/20 0559 02/01/20 0349 02/02/20 0411 02/02/20 1735 02/03/20 0241  NA 140   < > 142 144 143 142  K 3.7   < > 3.6 3.4* 3.9 4.5  CL 106  --  109 111  --  109  CO2 25  --  23 25  --  24  GLUCOSE 120*  --  124* 136*  --  177*  BUN 14  --  12 15  --  21  CALCIUM 9.5  --  9.7 9.5  --  9.9  CREATININE 0.93  --  0.76 0.65  --  0.69  GFRNONAA 60*  --  >60 >60  --  >60  GFRAA >60  --  >60 >60  --  >60   < > = values in this interval  not displayed.    LIVER FUNCTION TESTS: No results for input(s): BILITOT, AST, ALT, ALKPHOS, PROT, ALBUMIN in the last 8760 hours.  Assessment and Plan: History of acute CVA s/p cerebral arteriogram with emergent mechanical thrombectomy of left MCA M1 occlusion achieving a TICI 3 revascularization 01/30/2020 by Dr. Karenann Cai. Patient was extubated yesterday, however required reintubation overnight due to lethargy/fatigue. Appears respiratory driven.  Neuro exam overall stable- although remains lethargic today.   Further plans per TCTS/neurology/CCM.  Electronically Signed: Docia Barrier, PA 02/03/2020, 11:29 AM   I spent a total of 15 Minutes at the the patient's bedside AND on the patient's [name] floor or unit, greater than 50% of which was counseling/coordinating care for L MCA CVA.

## 2020-02-03 NOTE — Progress Notes (Signed)
VAST received consult regarding PIV infusing heparin in same arm as PICC; unit RN questioning if this is ok. Unit RN verbalized she was going to try to obtain another IV access, but wanted to know if PIV could stay in place. Educated if PICC had been placed first, it would not be okay to stick that arm for PIV access. However, since PIV was in place prior to PICC placement, PIV can continue to be used until new PIV access obtained. Unit RN verbalized understanding.

## 2020-02-03 NOTE — Progress Notes (Signed)
Chaplain engaged in follow-up visit with [name] [name] and met her brother, [name] [name].  Chaplain offered support and engaged in initial visit with [name] [name].  Chaplain will continue to follow-up.

## 2020-02-03 NOTE — Procedures (Signed)
Bronchoscopy Procedure Note [name] [name] [id number] 1941-11-12  Procedure: Bronchoscopy Indications: tracheostomy tube placement   Procedure Details Consent: Risks of procedure as well as the alternatives and risks of each were explained to the (patient/caregiver).  Consent for procedure obtained. Time Out: Verified patient identification, verified procedure, site/side was marked, verified correct patient position, special equipment/implants available, medications/allergies/relevent history reviewed, required imaging and test results available.  Performed  In preparation for procedure, patient was given 100% FiO2 and bronchoscope lubricated. Sedation: Benzodiazepines, Muscle relaxants and Etomidate  Procedure done by P Destinae Neubecker ACNP-BC, under direct supervision of Dr Tamala Julian . At first bronch was introduce through ET tube and structures of tracheal rings, carina identified for operator of tracheostomy who was Dr Lynetta Mare. Light of bronch passed through trachea and skin for indentification of tracheal rings for tracheostomy puncture. After this, under bronchoscopy guidance,  ET tube was pulled back sufficiently and very carefully. The ET tube was  pulled back enough to give room for tracheostomy operator and yet at same time to to ensure a secured airway. After this was accomplished, bronchoscope was withdrawn into the ET tube. After this,  Dr Lynetta Mare  then performed tracheostomy under video visual provided by flexible video bronchoscopy. Followng introduction of tracheostomy,  the bronchoscope was removed from ET tube and introduced through tracheostomy. Correct position of tracheostomy was ensured, with enough room between carina and distal tracheostomy and no evidence of bleeding. The bronchoscope was then withdrawn. Respiratory therapist was then instructed to remove the ET tube.  Dr Lynetta Mare  then proceeded to complete the tracheostomy with stay sutures   No complications   Erick Colace  ACNP-BC [place] Pager # [phone] OR # [phone] if no answer

## 2020-02-03 NOTE — Progress Notes (Signed)
NAME:  [name] [name], MRN:  [id number], DOB:  [birth year], LOS: 8 ADMISSION DATE:  01/26/2020, CONSULTATION DATE:  01/30/2020 REFERRING MD:  Rory Percy CHIEF COMPLAINT:  Right hemiplegia   Brief History        This 79 year old was admitted on 3/8 for evaluation of chest pain.  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  She was up ambulating today and suddenly developed a right hemiplegia.  CTA showed an M1 occlusion and thrombectomy was performed.  An attempt was made to extubate the patient post procedure but this was not tolerated.  Of note at the time the CTA was performed she was also found to have pulmonary emboli.  History of present illness       History is as noted above with the following additions.  Intraoperative TEE showed essentially normal LV systolic dysfunction there was some LVH and a PFO with a left to right flow.  There is no thrombus noted in the left atrial appendage.  Past Medical History  Thyroid cancer, hypertension, hyperlipidemia.  Significant Hospital Events   Cardiac cath 3/12, CABG 3/13  Consults:  Thoracic surgery, neurology, interventional radiology.  Procedures:  Four-vessel CABG, left M1 thrombectomy  Significant Diagnostic Tests:  3/15 LE venous doppler:  RIGHT:  - There is no evidence of deep vein thrombosis in the lower extremity.    - No cystic structure found in the popliteal fossa.    LEFT:  - Findings consistent with acute deep vein thrombosis involving the left  posterior tibial veins.  - No cystic structure found in the popliteal fossa.  3/15 echo: LVEF [id number], grade 1 diastolic dysfunction, LA dilation, RVSP mildly elevated 3/16 cth: No acute intracranial hemorrhage. Likely evolving recent infarction of the lentiform nucleus. 3/15 cta head/neck 1. Emergent large vessel occlusion at the left M1 segment. 2. Aspects of 10 with CT perfusion suggesting core infarct of 74  cc and 85 cc of penumbra. 3. Flow reducing left ICA bulb stenosis. 4. Bilateral subsegmental acute pulmonary emboli. 5. Proximal occlusion of the non dominant left vertebral artery with reconstitution at the dura. 3/16 eeg: This study showed evidence of epileptogenicity arising from left frontotemporal as well as cortical dysfunction in left hemisphere likely secondary to underlying stroke. No seizures were seen throughout the recording.  Micro Data:  3/8 sars2: neg  Antimicrobials:  None   Interim history/subjective:  3/19: reintubated yseterday afternoon with some difficulty 2/2 severe airway edema. Plan for trach today. Pt alert and following commands.  3/18: remains off sedation but not consistently awake yesterday to extubate. Otherwise tolerated cpap all day however.  However this am, awakens easily and is doing well. Following commands, tongue thrust and sustained head lift. Will extubate.   3/17: sz yesterday placed on keppra. RN reports wakes up and weakly follows commands but unable to illicit any response on my exam.  3/16: She remains intubated and mechanically ventilated this morning.  She is on no sedation at the time of my exam. Objective   Blood pressure 131/84, pulse 74, temperature 98 F (36.7 C), temperature source Oral, resp. rate 14, height 5\' 3"  (1.6 m), weight 68.7 kg, SpO2 100 %.    Vent Mode: PRVC FiO2 (%):  [40 %-100 %] 40 % Set Rate:  [15 bmp] 15 bmp Vt Set:  [420 mL] 420 mL PEEP:  [5 cmH20] 5 cmH20 Plateau Pressure:  [18 cmH20-21 cmH20] 18 cmH20   Intake/Output  Summary (Last 24 hours) at 02/03/2020 0858 Last data filed at 02/03/2020 0851 Gross per 24 hour  Intake 2016.5 ml  Output 1810 ml  Net 206.5 ml   Filed Weights   02/01/20 0500 02/02/20 0600 02/03/20 0400  Weight: 69.1 kg 68.5 kg 68.7 kg    Examination: General: She is orally intubated and mechanically ventilated.  She is on no continuous sedation.  Awake and following commands bilaterally,  weak on R HENT:NCAT, EOMI, PERRLA Lungs: Respirations are unlabored on a pressure support of 8.  There is symmetric air movement, no wheezes.  Chest tubes remain in place 330 out over 24hr Cardiovascular: S1 and S2 are regular without murmur rub or gallop.  Pacer wires in on back up rate Abdomen: The abdomen is soft without any organomegaly masses tenderness guarding or rebound.   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 If that was contributing 2/2 pain or hypophonation as pt appeared to attempt to speak.  -eeg with focus noted and started on keppra -decadron x4 doses Seizure: keppra Per neuro  Acute Pulmonary emboli:  -As noted this is distinctly unusual in a post bypass patient.   -Hep ab negative, anti jo, lupus, negative -remains on a/c Acute hypoxic Respiratory failure.   -will trach today -likely can transition to atc quickly after R Pleural effusion:  -cxr pending today.  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  Disposition: ICU  Labs   CBC: Recent Labs  Lab 01/30/20 0438 01/30/20 1200 01/31/20 0550 01/31/20 0550  01/31/20 0559 02/01/20 0349 02/02/20 0411 02/02/20 1735 02/03/20 0241  WBC 13.0*  --  11.4*  --   --  10.6* 12.9*  --  6.9  NEUTROABS  --   --  9.2*  --   --   --   --   --   --   HGB 10.2*   < > 9.6*   < > 10.2* 9.9* 9.8* 9.5* 9.0*  HCT 31.1*   < > 29.2*   < > 30.0* 30.8* 30.3* 28.0* 28.1*  MCV 92.0  --  91.5  --   --  91.7 91.8  --  93.7  PLT 165  --  182  --   --  199 213  --  194   < > = values in this interval not displayed.    Basic Metabolic Panel: Recent Labs  Lab 01/28/20 1829 01/30/20 0438 01/30/20 1200 01/31/20 0550 01/31/20 0550 01/31/20 0559 02/01/20 0349 02/01/20 1021 02/01/20 1631 02/02/20 0411 02/02/20 1703 02/02/20 1735 02/03/20 0241  NA  --  137   < > 140   < > 139 142  --   --  144  --  143 142  K  --  3.8   < > 3.7   < > 3.5 3.6  --   --  3.4*  --  3.9 4.5  CL  --  101  --  106  --   --  109  --   --  111  --   --  109  CO2  --  28  --  25  --   --  23  --   --  25  --   --  24  GLUCOSE  --  112*  --  120*  --   --  124*  --   --  136*  --   --  177*  BUN  --  19  --  14  --   --  12  --   --  15  --   --  21  CREATININE  --  1.19*  --  0.93  --   --  0.76  --   --  0.65  --   --  0.69  CALCIUM  --  9.6  --  9.5  --   --  9.7  --   --  9.5  --   --  9.9  MG   < >  --   --   --   --   --   --  2.0 1.9 1.7 1.7  --  1.7  PHOS  --   --   --   --   --   --   --  2.0* 2.0* 2.0* 2.7  --  3.1   < > = values in this interval not displayed.   GFR: Estimated Creatinine Clearance: 55.6 mL/min (by C-G formula based on SCr of 0.69 mg/dL). Recent Labs  Lab 01/31/20 0550 02/01/20 0349 02/02/20 0411 02/03/20 0241  WBC 11.4* 10.6* 12.9* 6.9    Liver Function Tests: No results for input(s): AST, ALT, ALKPHOS, BILITOT, PROT, ALBUMIN in the last 168 hours. No results for input(s): LIPASE, AMYLASE in the last 168 hours. No results for input(s): AMMONIA in the last 168 hours.  ABG    Component Value Date/Time   PHART 7.427 02/02/2020 1735   PCO2ART 43.3  02/02/2020 1735   PO2ART 301.0 (H) 02/02/2020 1735   HCO3 28.5 (H) 02/02/2020 1735   TCO2 30 02/02/2020 1735   ACIDBASEDEF 2.0 01/28/2020 1545   O2SAT 100.0 02/02/2020 1735     Coagulation Profile: Recent Labs  Lab 01/27/20 2048 01/28/20 0625 01/30/20 0955  INR 1.5* 1.1 1.1    Cardiac Enzymes: No results for input(s): CKTOTAL, CKMB, CKMBINDEX, TROPONINI in the last 168 hours.  HbA1C: Hgb A1c MFr Bld  Date/Time Value Ref Range Status  01/27/2020 05:24 AM 5.1 4.8 - 5.6 % Final    Comment:    (NOTE) Pre diabetes:          5.7%-6.4% Diabetes:              >6.4% Glycemic control for   <7.0% adults with diabetes     CBG: Recent Labs  Lab 02/02/20 1508 02/02/20 2045 02/03/20 0011 02/03/20 0359 02/03/20 0759  GLUCAP 119* 176* 155* 169* 147*     Critical care time: The patient is critically ill with multiple organ systems failure and requires high complexity decision making for assessment and support, frequent evaluation and titration of therapies, application of advanced monitoring technologies and extensive interpretation of multiple databases.  Critical care time 36 mins. This represents my time independent of the NPs time taking care of the pt. This is excluding procedures.    [facility] 02/03/2020, 8:58 AM

## 2020-02-04 ENCOUNTER — Inpatient Hospital Stay (HOSPITAL_COMMUNITY): Payer: PPO

## 2020-02-04 LAB — BASIC METABOLIC PANEL
Anion gap: 9 (ref 5–15)
BUN: 32 mg/dL — ABNORMAL HIGH (ref 8–23)
CO2: 26 mmol/L (ref 22–32)
Calcium: 9.9 mg/dL (ref 8.9–10.3)
Chloride: 107 mmol/L (ref 98–111)
Creatinine, Ser: 0.85 mg/dL (ref 0.44–1.00)
GFR calc Af Amer: >60 mL/min (ref >60)
GFR calc non Af Amer: >60 mL/min (ref >60)
Glucose, Bld: 131 mg/dL — ABNORMAL HIGH (ref 70–99)
Potassium: 3.8 mmol/L (ref 3.5–5.1)
Sodium: 142 mmol/L (ref 135–145)

## 2020-02-04 LAB — CBC
HCT: 27.3 % — ABNORMAL LOW (ref 36.0–46.0)
Hemoglobin: 8.7 g/dL — ABNORMAL LOW (ref 12.0–15.0)
MCH: 29.9 pg (ref 26.0–34.0)
MCHC: 31.9 g/dL (ref 30.0–36.0)
MCV: 93.8 fL (ref 80.0–100.0)
Platelets: 229 10*3/uL (ref 150–400)
RBC: 2.91 MIL/uL — ABNORMAL LOW (ref 3.87–5.11)
RDW: 14.3 % (ref 11.5–15.5)
WBC: 8.9 10*3/uL (ref 4.0–10.5)
nRBC: 0 % (ref 0.0–0.2)

## 2020-02-04 LAB — GLUCOSE, CAPILLARY
Glucose-Capillary: 103 mg/dL — ABNORMAL HIGH (ref 70–99)
Glucose-Capillary: 108 mg/dL — ABNORMAL HIGH (ref 70–99)
Glucose-Capillary: 110 mg/dL — ABNORMAL HIGH (ref 70–99)
Glucose-Capillary: 111 mg/dL — ABNORMAL HIGH (ref 70–99)
Glucose-Capillary: 117 mg/dL — ABNORMAL HIGH (ref 70–99)
Glucose-Capillary: 124 mg/dL — ABNORMAL HIGH (ref 70–99)
Glucose-Capillary: 144 mg/dL — ABNORMAL HIGH (ref 70–99)

## 2020-02-04 LAB — MAGNESIUM: Magnesium: 2.1 mg/dL (ref 1.7–2.4)

## 2020-02-04 LAB — HEPARIN LEVEL (UNFRACTIONATED)
Heparin Unfractionated: 0.3 IU/mL (ref 0.30–0.70)
Heparin Unfractionated: 0.4 IU/mL (ref 0.30–0.70)

## 2020-02-04 NOTE — Progress Notes (Signed)
  Speech Language Pathology  Patient Details Name: [name] [name] MRN: [id number] DOB: [birth year] Today's Date: 02/04/2020 Time:  -     Orders received for PMV and swallow. Pt trach'd yesterday and currently on ventilator. ST will plan to follow up 3/22 for appropriateness for inline vent valve or if placed on TC.             GO                Houston Siren 02/04/2020, 7:48 AM 01-23-2513

## 2020-02-04 NOTE — Progress Notes (Signed)
ANTICOAGULATION CONSULT NOTE - Follow Up Consult  Pharmacy Consult for heparin Indication: PE/DVT/CVA/CABG  Labs: Recent Labs    02/01/20 0349 02/01/20 0349 02/02/20 0411 02/02/20 0411 02/02/20 1317 02/02/20 1735 02/03/20 0241 02/04/20 0014  HGB 9.9*   < > 9.8*   < >  --  9.5* 9.0*  --   HCT 30.8*   < > 30.3*  --   --  28.0* 28.1*  --   PLT 199  --  213  --   --   --  194  --   HEPARINUNFRC 0.36   < > 0.26*   < > 0.34  --  0.37 0.30  CREATININE 0.76  --  0.65  --   --   --  0.69  --    < > = values in this interval not displayed.    Assessment/Plan:  79yo female therapeutic on heparin after resumed s/p bedside trach. Will continue gtt at current rate and confirm stable with additional level.   Wynona Neat, PharmD, BCPS  02/04/2020,12:55 AM

## 2020-02-04 NOTE — Progress Notes (Signed)
      [phone]       [place] [address]             [phone]                 5 Days Post-Op Procedure(s) (LRB): IR WITH ANESTHESIA (N/A) S/p CABG, complicated by post-op stroke  Events: No events.  Tolerated trach _______________________________________________________________ Vitals: BP (!) 136/57   Pulse 71   Temp 98.6 F (37 C) (Axillary)   Resp 17   Ht 5\' 3"  (1.6 m)   Wt 66.8 kg   SpO2 100%   BMI 26.09 kg/m   - Neuro: arousable.  - Cardiovascular: Sinus  Drips: heparin .      - Pulm: Clear Vent Mode: CPAP;PSV FiO2 (%):  [40 %] 40 % Set Rate:  [15 bmp] 15 bmp Vt Set:  [420 mL] 420 mL PEEP:  [5 cmH20] 5 cmH20 Pressure Support:  [8 cmH20] 8 cmH20 Plateau Pressure:  [17 cmH20-20 cmH20] 17 cmH20  ABG    Component Value Date/Time   PHART 7.427 02/02/2020 1735   PCO2ART 43.3 02/02/2020 1735   PO2ART 301.0 (H) 02/02/2020 1735   HCO3 28.5 (H) 02/02/2020 1735   TCO2 30 02/02/2020 1735   ACIDBASEDEF 2.0 01/28/2020 1545   O2SAT 100.0 02/02/2020 1735    - Abd: soft - Extremity: warm  .Intake/Output      03/19 0701 - 03/20 0700 03/20 0701 - 03/21 0700   I.V. (mL/kg) 426.6 (6.4) 8.5 (0.1)   NG/GT 290 45   IV Piggyback 266    Total Intake(mL/kg) 982.6 (14.7) 53.5 (0.8)   Urine (mL/kg/hr) 1550 (1) 175 (1.1)   Emesis/NG output     Chest Tube 60    Total Output 1610 175   Net -627.4 -121.5           _______________________________________________________________ Labs: CBC Latest Ref Rng & Units 02/04/2020 02/03/2020 02/02/2020  WBC 4.0 - 10.5 K/uL 8.9 6.9 -  Hemoglobin 12.0 - 15.0 g/dL 8.7(L) 9.0(L) 9.5(L)  Hematocrit 36.0 - 46.0 % 27.3(L) 28.1(L) 28.0(L)  Platelets 150 - 400 K/uL 229 194 -   CMP Latest Ref Rng & Units 02/04/2020 02/03/2020 02/02/2020  Glucose 70 - 99 mg/dL 131(H) 177(H) -  BUN 8 - 23 mg/dL 32(H) 21 -  Creatinine 0.44 - 1.00 mg/dL 0.85 0.69 -  Sodium 135 - 145 mmol/L 142 142 143  Potassium 3.5 - 5.1 mmol/L 3.8  4.5 3.9  Chloride 98 - 111 mmol/L 107 109 -  CO2 22 - 32 mmol/L 26 24 -  Calcium 8.9 - 10.3 mg/dL 9.9 9.9 -    CXR: Right pleuiral effusion  _______________________________________________________________  Assessment and Plan: POD 8 s/p CABG, complicated by post-op stroke  Neuro: mental status stable CV: stable, on asp, statin BB Pulm: wean vent as tolerated.  Would consider thoracentesis on the right Renal: good uop.  Creat stable GI: on tube feeds Heme: stable ID: [email or web address] Endo: SSI  Dispo: continue ICU care  Melodie Bouillon, MD 02/04/2020 9:25 AM

## 2020-02-04 NOTE — Evaluation (Signed)
Physical Therapy Re-Evaluation Patient Details Name: [name] [name] MRN: [id number] DOB: December 17, 1940 Today's Date: 02/04/2020   History of Present Illness   Pt is a 79 y.o. female admitted 01/26/20 after cardiac cath showed severe vessel disease; s/p CABG x4 on 3/13. 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 Pt with bilateral PEs. Taken to IR and found to have L M1/MCA occlusion, s/p emergent mechanical thrombectomy. ETT 3/12-3/13 (for procedure), 3/15-3/19. S/p trach 3/19. PMH includes CAD, HTN, THA, osteoporosis.   Clinical Impression  Pt re-evaluated s/p reintubation and trach placement. RN request bed-level evaluation today secondary to new trach and bloody drainage. Pt presents with an overall decrease in functional mobility secondary to above. Pt pleasant and agreeable to participate, demonstrates improved attention to R-side although persistent R-side hemiparesis. Following majority of simple commands, nodding yes/no appropriately. Bed left in chair position and pt demonstrating good tolerance to upright. SpO2 97-100% on 10L O2 via trach collar at 40% FiO2. Pt would benefit from intensive CIR-level therapies to maximize functional mobility and independence prior to return home.    Follow Up Recommendations CIR;Supervision/Assistance - 24 hour    Equipment Recommendations  (TBD)    Recommendations for Other Services       Precautions / Restrictions Precautions Precautions: Sternal;Fall;Other (comment) Precaution Comments: R-side weakness      Mobility  Bed Mobility Overal bed mobility: Needs Assistance             General bed mobility comments: RN request pt remain supine secondary to new trach with significant bloody drainage; pt able to come to long sit from elevated HOB with assist; maxA+2 to scoot up in bed  Transfers                    Ambulation/Gait                 Stairs            Wheelchair Mobility    Modified Rankin (Stroke Patients Only)       Balance                                             Pertinent Vitals/Pain Pain Assessment: Faces Faces Pain Scale: Hurts a little bit Pain Location: With RUE ROM Pain Descriptors / Indicators: Grimacing Pain Intervention(s): Monitored during session    Home Living Family/patient expects to be discharged to:: Private residence Living Arrangements: Alone Available Help at Discharge: Family;Available PRN/intermittently Type of Home: House Home Access: Stairs to enter Entrance Stairs-Rails: Right Entrance Stairs-Number of Steps: 3-4 Home Layout: One level   Additional Comments: Limited info due to pt not verbalizing. Nodding/shaking head appropriately    Prior Function Level of Independence: Independent               Hand Dominance   Dominant Hand: Right    Extremity/Trunk Assessment   Upper Extremity Assessment Upper Extremity Assessment: RUE deficits/detail;LUE deficits/detail RUE Deficits / Details: Slight inattention noted (improved from initial eval?); significant R arm/hand swelling; wrist/fingers at least 3/5, elbow flex ext <3/5, shoulder <3/5 LUE Deficits / Details: Functionally at least 3/5; increased swelling in wrist/fingers    Lower Extremity Assessment Lower Extremity Assessment: RLE deficits/detail;LLE deficits/detail RLE Deficits / Details: Gross strength testing while supine - R hip flex  3-/5, knee flex/ext <3/5, ankle DF <3/5 LLE Deficits / Details: LLE grossly at least 3/5 throughout    Cervical / Trunk Assessment Cervical / Trunk Assessment: (initiating anterior cervical motion well despite new trach/bloody drainage)  Communication   Communication: Tracheostomy  Cognition Arousal/Alertness: Awake/alert Behavior During Therapy: WFL for tasks assessed/performed Overall Cognitive Status: Difficult to assess                          Following Commands: Follows one step commands consistently;Follows one step commands with increased time       General Comments: Following one step commands, nodding yes/no and giving 'thumbs up' appropriately. Pleasant and agreeable      General Comments General comments (skin integrity, edema, etc.): SpO2 97-100% on 10L O2 via trach collar at 40% FiO2; supine BP 148/61, bed in chair position BP 160/67    Exercises     Assessment/Plan    PT Assessment Patient needs continued PT services  PT Problem List Decreased strength;Decreased activity tolerance;Decreased balance;Decreased mobility;Decreased cognition;Decreased safety awareness;Decreased knowledge of precautions;Decreased range of motion;Decreased knowledge of use of DME;Cardiopulmonary status limiting activity       PT Treatment Interventions DME instruction;Gait training;Functional mobility training;Therapeutic activities;Therapeutic exercise;Balance training;Neuromuscular re-education;Cognitive remediation;Patient/family education;Stair training    PT Goals (Current goals can be found in the Care Plan section)  Acute Rehab PT Goals Patient Stated Goal: Pt unable to state - agreeable to participate PT Goal Formulation: With patient Time For Goal Achievement: 02/18/20 Potential to Achieve Goals: Good    Frequency Min 4X/week   Barriers to discharge Decreased caregiver support;Inaccessible home environment      Co-evaluation               AM-PAC PT "6 Clicks" Mobility  Outcome Measure Help needed turning from your back to your side while in a flat bed without using bedrails?: A Lot Help needed moving from lying on your back to sitting on the side of a flat bed without using bedrails?: A Lot Help needed moving to and from a bed to a chair (including a wheelchair)?: A Lot Help needed standing up from a chair using your arms (e.g., wheelchair or bedside chair)?: A Lot Help needed to walk in  hospital room?: A Lot Help needed climbing 3-5 steps with a railing? : Total 6 Click Score: 11    End of Session Equipment Utilized During Treatment: Oxygen Activity Tolerance: Patient tolerated treatment well Patient left: in bed;with call bell/phone within reach;with bed alarm set;with SCD's reapplied;Other (comment)(in chair position) Nurse Communication: Mobility status PT Visit Diagnosis: Other abnormalities of gait and mobility (R26.89);Hemiplegia and hemiparesis Hemiplegia - Right/Left: Right Hemiplegia - dominant/non-dominant: Dominant Hemiplegia - caused by: Cerebral infarction    Time: 1435-1501 PT Time Calculation (min) (ACUTE ONLY): 26 min   Charges:   PT Evaluation $PT Re-evaluation: 1 Re-eval PT Treatments $Therapeutic Activity: 8-22 mins   Mabeline Caras, PT, DPT Acute Rehabilitation Services  Pager [phone] Office [place] 02/04/2020, 4:00 PM

## 2020-02-04 NOTE — Progress Notes (Signed)
NAME:  [name] [name], MRN:  [id number], DOB:  [birth year], LOS: 9 ADMISSION DATE:  01/26/2020, CONSULTATION DATE:  01/30/2020 REFERRING MD:  Rory Percy CHIEF COMPLAINT:  Right hemiplegia   Brief History        This 79 year old was admitted on 3/8 for evaluation of chest pain.  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  She was up ambulating today and suddenly developed a right hemiplegia.  CTA showed an M1 occlusion and thrombectomy was performed.  An attempt was made to extubate the patient post procedure but this was not tolerated.  Of note at the time the CTA was performed she was also found to have pulmonary emboli.  History of present illness       History is as noted above with the following additions.  Intraoperative TEE showed essentially normal LV systolic dysfunction there was some LVH and a PFO with a left to right flow.  There is no thrombus noted in the left atrial appendage.  Past Medical History  Thyroid cancer, hypertension, hyperlipidemia.  Significant Hospital Events   Cardiac cath 3/12, CABG 3/13  Consults:  Thoracic surgery, neurology, interventional radiology.  Procedures:  Four-vessel CABG, left M1 thrombectomy 3/19 trach  Significant Diagnostic Tests:  3/15 LE venous doppler:  RIGHT:  - There is no evidence of deep vein thrombosis in the lower extremity.    - No cystic structure found in the popliteal fossa.    LEFT:  - Findings consistent with acute deep vein thrombosis involving the left  posterior tibial veins.  - No cystic structure found in the popliteal fossa.  3/15 echo: LVEF 99991111, grade 1 diastolic dysfunction, LA dilation, RVSP mildly elevated 3/16 cth: No acute intracranial hemorrhage. Likely evolving recent infarction of the lentiform nucleus. 3/15 cta head/neck 1. Emergent large vessel occlusion at the left M1 segment. 2. Aspects of 10 with CT perfusion suggesting core infarct  of 74 cc and 85 cc of penumbra. 3. Flow reducing left ICA bulb stenosis. 4. Bilateral subsegmental acute pulmonary emboli. 5. Proximal occlusion of the non dominant left vertebral artery with reconstitution at the dura. 3/16 eeg: This study showed evidence of epileptogenicity arising from left frontotemporal as well as cortical dysfunction in left hemisphere likely secondary to underlying stroke. No seizures were seen throughout the recording.  Micro Data:  3/8 sars2: neg  Antimicrobials:  None   Interim history/subjective:  3/20 no acute events o/n. Awakens easily and interactive. 3/19: reintubated yseterday afternoon with some difficulty 2/2 severe airway edema. Plan for trach today. Pt alert and following commands.  3/18: remains off sedation but not consistently awake yesterday to extubate. Otherwise tolerated cpap all day however.  However this am, awakens easily and is doing well. Following commands, tongue thrust and sustained head lift. Will extubate.   3/17: sz yesterday placed on keppra. RN reports wakes up and weakly follows commands but unable to illicit any response on my exam.  3/16: She remains intubated and mechanically ventilated this morning.  She is on no sedation at the time of my exam. Objective   Blood pressure (!) 147/58, pulse 66, temperature 98.6 F (37 C), temperature source Axillary, resp. rate 15, height 5\' 3"  (1.6 m), weight 66.8 kg, SpO2 100 %.    Vent Mode: PRVC FiO2 (%):  [40 %] 40 % Set Rate:  [15 bmp] 15 bmp Vt Set:  [420 mL] 420 mL PEEP:  [5 cmH20] 5 cmH20  Pressure Support:  [8 [place] Pressure:  [13 cmH20-20 cmH20] 17 cmH20   Intake/Output Summary (Last 24 hours) at 02/04/2020 0831 Last data filed at 02/04/2020 0800 Gross per 24 hour  Intake 962.55 ml  Output 1785 ml  Net -822.45 ml   Filed Weights   02/02/20 0600 02/03/20 0400 02/04/20 0500  Weight: 68.5 kg 68.7 kg 66.8 kg    Examination: General: .  She is on no  continuous sedation.  Awake and following commands bilaterally, weak on R, NAD.  HENT:[place]/AT, EOMI, PERRLA; She has fresh tracheostomy, with dried blood around site and dressings.   Lungs: On PRVC this a.m.  There is symmetric air movement, no wheezes.  Chest tubes remain in place.   Cardiovascular: RRR; no r/m/g. Pacemaker on backup. Sternotomy c/d/i.   Abdomen: The abdomen is soft without any organomegaly masses tenderness guarding or rebound.    Extremities: 1-2+ symmetric lower extremity edema   Neuro: CNII-XII intact to confrontation. 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   -Hep ab negative, anti jo, lupus, negative -remains on heparin  Acute hypoxic Respiratory failure.   -trach 3/19 -likely can transition to atc quickly after R Pleural effusion:  -cxr pending today.  ->300 out of chest tube 3/18; 60cc 3/19.  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2*  --   --   --   --   --   --   HGB 9.6*   < > 9.9* 9.8* 9.5* 9.0* 8.7*  HCT 29.2*   < > 30.8* 30.3* 28.0* 28.1* 27.3*  MCV 91.5  --  91.7 91.8  --  93.7 93.8  PLT 182  --  199 213  --  194 229   < > = values in this interval not displayed.    Basic Metabolic Panel: Recent Labs  Lab 01/28/20 1829 01/31/20 0550 01/31/20 0559 02/01/20 0349 02/01/20 1021 02/01/20 1021 02/01/20 1631 02/02/20 0411 02/02/20 1703 02/02/20 1735 02/03/20 0241 02/04/20 0413  NA  --  140   < > 142  --   --   --  144  --  143 142 142  K  --  3.7   < > 3.6  --   --   --  3.4*  --  3.9 4.5 3.8  CL  --  106  --  109  --   --   --  111  --   --  109 107  CO2  --  25  --  23  --   --   --  25  --   --  24 26  GLUCOSE  --  120*  --  124*  --   --   --  136*  --   --  177* 131*  BUN  --  14  --  12  --   --   --  15  --   --  21 32*  CREATININE  --  0.93  --  0.76  --   --   --  0.65  --   --  0.69 0.85  CALCIUM  --  9.5  --  9.7  --   --   --  9.5  --   --  9.9 9.9  MG   < >  --   --   --  2.0   < > 1.9 1.7 1.7  --  1.7 2.1  PHOS  --   --   --   --  2.0*  --  2.0* 2.0* 2.7  --  3.1  --    < > = values in this interval not displayed.   GFR: Estimated Creatinine Clearance: 51.7 mL/min (by C-G formula based on SCr of 0.85 mg/dL). Recent Labs  Lab 02/01/20 0349 02/02/20 0411 02/03/20 0241 02/04/20 0413  WBC 10.6* 12.9* 6.9 8.9    Liver Function Tests: No results for input(s): AST, ALT, ALKPHOS, BILITOT, PROT, ALBUMIN in the last 168 hours. No results for input(s): LIPASE, AMYLASE in the last 168 hours. No results for input(s): AMMONIA in the last 168 hours.  ABG    Component Value Date/Time   PHART 7.427 02/02/2020 1735   PCO2ART 43.3 02/02/2020 1735   PO2ART 301.0 (H) 02/02/2020 1735   HCO3 28.5 (H) 02/02/2020 1735   TCO2 30 02/02/2020 1735   ACIDBASEDEF 2.0 01/28/2020 1545   O2SAT 100.0  02/02/2020 1735     Coagulation Profile: Recent Labs  Lab 01/30/20 0955  INR 1.1    Cardiac Enzymes: No results for input(s): CKTOTAL, CKMB, CKMBINDEX, TROPONINI in the last 168 hours.  HbA1C: Hgb A1c MFr Bld  Date/Time Value Ref Range Status  01/27/2020 05:24 AM 5.1 4.8 - 5.6 % Final    Comment:    (NOTE) Pre diabetes:          5.7%-6.4% Diabetes:              >6.4% Glycemic control for   <7.0% adults with diabetes     CBG: Recent Labs  Lab 02/03/20 1759 02/03/20 1958 02/04/20 0006 02/04/20 0348 02/04/20 0805  GLUCAP 115* 143* 144* 117* 124*      I have independently seen and examined the patient, reviewed data, and developed an assessment and plan. A total of 39 minutes were spent in critical care assessment and medical decision making. This critical care time does not reflect procedure time, or teaching time or supervisory time of PA/NP/Med student/Med Resident, etc but could involve care discussion time.  Bonna Gains, MD PhD 02/04/2020, 8:44 AM

## 2020-02-04 NOTE — Progress Notes (Signed)
ANTICOAGULATION CONSULT NOTE - [place] for Heparin Indication: pulmonary embolus, stroke and DVT, and recent CABG  Allergies  Allergen Reactions  . Bee Venom Swelling    Massive swelling  . Penicillins Other (See Comments)    UNSPECIFIED REACTION  Did it involve swelling of the face/tongue/throat, SOB, or low BP? Unknown Did it involve sudden or severe rash/hives, skin peeling, or any reaction on the inside of your mouth or nose? Unknown Did you need to seek medical attention at a hospital or doctor's office? Unknown When did it last happen?1945 If all above answers are "NO", may proceed with cephalosporin use.  . Adhesive [Tape] Rash  . Morphine And Related Rash  . Sulfa Antibiotics Nausea And Vomiting and Rash    Patient Measurements: Height: 5\' 3"  (160 cm) Weight: 147 lb 4.3 oz (66.8 kg) IBW/kg (Calculated) : 52.4 Heparin Dosing Weight: 65.3 kg  Vital Signs: Temp: 98.4 F (36.9 C) (03/20 0008) Temp Source: Axillary (03/20 0008) BP: 145/59 (03/20 0600) Pulse Rate: 74 (03/20 0600)  Labs: Recent Labs    02/02/20 0411 02/02/20 1317 02/02/20 1735 02/03/20 0241 02/04/20 0014 02/04/20 0413  HGB 9.8*   < > 9.5* 9.0*  --  8.7*  HCT 30.3*  --  28.0* 28.1*  --  27.3*  PLT 213  --   --  194  --  229  HEPARINUNFRC 0.26*   < >  --  0.37 0.30 0.40  CREATININE 0.65  --   --  0.69  --  0.85   < > = values in this interval not displayed.    Estimated Creatinine Clearance: 51.7 mL/min (by C-G formula based on SCr of 0.85 mg/dL).  Medications:  Plavix is being held as changing to full anticoagulation with IV Heparin  Assessment: 79 year old female admitted with angina on 3/11 and s/p CABG on 3/12 who had acute stroke symptoms on 3/15 AM. CTA of Head and Neck showed an acute MCA infarct and bilateral subsegmental acute pulmonary emboli. She is now s/p IR guided mechanical thrombectomy and Pharmacy has been consulted to start IV Heparin therapy.  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   HIT antibody negative, lupus anticoagulant screen negative  She is now s/p trach, heparin restarted on 3/19 Heparin level within goal 0.4. CBC stable  Goal of Therapy:  Heparin level 0.3 to 0.5 units/ml Monitor platelets by anticoagulation protocol: Yes   Plan:  -continue heparin at 850 units/hr -daily heparin level and CBC   Vertis Kelch, PharmD, [facility] PGY2 Cardiology Pharmacy Resident Phone [phone] 02/04/2020       7:16 AM  Please check [email or web address] for unit-specific pharmacist phone numbers

## 2020-02-05 LAB — BASIC METABOLIC PANEL
Anion gap: 10 (ref 5–15)
BUN: 29 mg/dL — ABNORMAL HIGH (ref 8–23)
CO2: 26 mmol/L (ref 22–32)
Calcium: 9.4 mg/dL (ref 8.9–10.3)
Chloride: 106 mmol/L (ref 98–111)
Creatinine, Ser: 0.7 mg/dL (ref 0.44–1.00)
GFR calc Af Amer: >60 mL/min (ref >60)
GFR calc non Af Amer: >60 mL/min (ref >60)
Glucose, Bld: 124 mg/dL — ABNORMAL HIGH (ref 70–99)
Potassium: 3.7 mmol/L (ref 3.5–5.1)
Sodium: 142 mmol/L (ref 135–145)

## 2020-02-05 LAB — PHOSPHORUS: Phosphorus: 2.7 mg/dL (ref 2.5–4.6)

## 2020-02-05 LAB — CBC
HCT: 27.8 % — ABNORMAL LOW (ref 36.0–46.0)
Hemoglobin: 8.9 g/dL — ABNORMAL LOW (ref 12.0–15.0)
MCH: 30.1 pg (ref 26.0–34.0)
MCHC: 32 g/dL (ref 30.0–36.0)
MCV: 93.9 fL (ref 80.0–100.0)
Platelets: 226 10*3/uL (ref 150–400)
RBC: 2.96 MIL/uL — ABNORMAL LOW (ref 3.87–5.11)
RDW: 14.6 % (ref 11.5–15.5)
WBC: 10.6 10*3/uL — ABNORMAL HIGH (ref 4.0–10.5)
nRBC: 0 % (ref 0.0–0.2)

## 2020-02-05 LAB — GLUCOSE, CAPILLARY
Glucose-Capillary: 108 mg/dL — ABNORMAL HIGH (ref 70–99)
Glucose-Capillary: 114 mg/dL — ABNORMAL HIGH (ref 70–99)
Glucose-Capillary: 119 mg/dL — ABNORMAL HIGH (ref 70–99)
Glucose-Capillary: 111 mg/dL — ABNORMAL HIGH (ref 70–99)
Glucose-Capillary: 133 mg/dL — ABNORMAL HIGH (ref 70–99)

## 2020-02-05 LAB — MAGNESIUM: Magnesium: 1.7 mg/dL (ref 1.7–2.4)

## 2020-02-05 LAB — HEPARIN LEVEL (UNFRACTIONATED): Heparin Unfractionated: 0.47 IU/mL (ref 0.30–0.70)

## 2020-02-05 MED ORDER — POTASSIUM CHLORIDE 20 MEQ/15ML (10%) PO SOLN
20.0000 meq | ORAL | Status: AC
  Administered 2020-02-05 (×3): 20 meq
  Filled 2020-02-05 (×3): qty 15

## 2020-02-05 NOTE — Progress Notes (Signed)
NAME:  [name] [name], MRN:  [id number], DOB:  [birth year], LOS: 42 ADMISSION DATE:  01/26/2020, CONSULTATION DATE:  01/30/2020 REFERRING MD:  Rory Percy CHIEF COMPLAINT:  Right hemiplegia   Brief History        This 79 year old was admitted on 3/8 for evaluation of chest pain.  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  She was up ambulating today and suddenly developed a right hemiplegia.  CTA showed an M1 occlusion and thrombectomy was performed.  An attempt was made to extubate the patient post procedure but this was not tolerated.  Of note at the time the CTA was performed she was also found to have pulmonary emboli.  History of present illness       History is as noted above with the following additions.  Intraoperative TEE showed essentially normal LV systolic dysfunction there was some LVH and a PFO with a left to right flow.  There is no thrombus noted in the left atrial appendage.  Past Medical History  Thyroid cancer, hypertension, hyperlipidemia.  Significant Hospital Events   Cardiac cath 3/12, CABG 3/13  Consults:  Thoracic surgery, neurology, interventional radiology.  Procedures:  Four-vessel CABG, left M1 thrombectomy 3/19 trach  Significant Diagnostic Tests:  3/15 LE venous doppler:  RIGHT:  - There is no evidence of deep vein thrombosis in the lower extremity.    - No cystic structure found in the popliteal fossa.    LEFT:  - Findings consistent with acute deep vein thrombosis involving the left  posterior tibial veins.  - No cystic structure found in the popliteal fossa.  3/15 echo: LVEF 99991111, grade 1 diastolic dysfunction, LA dilation, RVSP mildly elevated 3/16 cth: No acute intracranial hemorrhage. Likely evolving recent infarction of the lentiform nucleus. 3/15 cta head/neck 1. Emergent large vessel occlusion at the left M1 segment. 2. Aspects of 10 with CT perfusion suggesting core  infarct of 74 cc and 85 cc of penumbra. 3. Flow reducing left ICA bulb stenosis. 4. Bilateral subsegmental acute pulmonary emboli. 5. Proximal occlusion of the non dominant left vertebral artery with reconstitution at the dura. 3/16 eeg: This study showed evidence of epileptogenicity arising from left frontotemporal as well as cortical dysfunction in left hemisphere likely secondary to underlying stroke. No seizures were seen throughout the recording.  Micro Data:  3/8 sars2: neg  Antimicrobials:  None   Interim history/subjective:  3/20 no acute events o/n. Awakens easily and interactive. 3/19: reintubated yseterday afternoon with some difficulty 2/2 severe airway edema. Plan for trach today. Pt alert and following commands.  3/18: remains off sedation but not consistently awake yesterday to extubate. Otherwise tolerated cpap all day however.  However this am, awakens easily and is doing well. Following commands, tongue thrust and sustained head lift. Will extubate.   3/17: sz yesterday placed on keppra. RN reports wakes up and weakly follows commands but unable to illicit any response on my exam.  3/16: She remains intubated and mechanically ventilated this morning.  She is on no sedation at the time of my exam. Objective   Blood pressure (!) 121/52, pulse 66, temperature 97.8 F (36.6 C), temperature source Oral, resp. rate 18, height 5\' 3"  (1.6 m), weight 65.8 kg, SpO2 99 %.    FiO2 (%):  [28 %-40 %] 28 %   Intake/Output Summary (Last 24 hours) at 02/05/2020 1235 Last data filed at 02/05/2020 1200 Gross per 24 hour  Intake  1618.21 ml  Output 2190 ml  Net -571.79 ml   Filed Weights   02/03/20 0400 02/04/20 0500 02/05/20 0633  Weight: 68.7 kg 66.8 kg 65.8 kg    Examination: General: .  She is on no continuous sedation.  Awake and following commands bilaterally, weak on R, NAD.  HENT:[place]/AT, EOMI, PERRLA; She has fresh tracheostomy, with dried blood around site and dressings.     Lungs: On PRVC this a.m.  There is symmetric air movement, no wheezes.  Chest tubes remain in place.   Cardiovascular: RRR; no r/m/g. Pacemaker on backup. Sternotomy c/d/i.   Abdomen: The abdomen is soft without any organomegaly masses tenderness guarding or rebound.    Extremities: 1-2+ symmetric lower extremity edema   Neuro: CNII-XII intact to confrontation. 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   -Hep ab negative, anti jo, lupus, negative -remains on heparin  Acute hypoxic Respiratory failure.   -trach 3/19 -tolerating trach trials  R Pleural effusion:  -cxr stable ->300 out of chest tube 3/18; 60cc 3/19. 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  HTN:  -cont home lisinopril,  bb  Acute L LE DVT:  -on a/c   Hypothyroid:  -levothyroxine    Best practice:  Diet: TF Pain/Anxiety/Delirium protocol (if indicated): avoid as able VAP protocol (if indicated): per protocol DVT prophylaxis: Fully anticoagulated  GI prophylaxis: Protonix  Glucose control: monitoring Mobility: Bedrest, PT  code Status: Full Family Communication:  Disposition: ICU; CIR when ready  Labs   CBC: Recent Labs  Lab 01/31/20 0550 01/31/20 0559 02/01/20 0349 02/01/20 0349 02/02/20 0411 02/02/20 1735 02/03/20 0241 02/04/20 0413 02/05/20 0422  WBC 11.4*   < > 10.6*  --  12.9*  --  6.9 8.9 10.6*  NEUTROABS 9.2*   --   --   --   --   --   --   --   --   HGB 9.6*   < > 9.9*   < > 9.8* 9.5* 9.0* 8.7* 8.9*  HCT 29.2*   < > 30.8*   < > 30.3* 28.0* 28.1* 27.3* 27.8*  MCV 91.5   < > 91.7  --  91.8  --  93.7 93.8 93.9  PLT 182   < > 199  --  213  --  194 229 226   < > = values in this interval not displayed.    Basic Metabolic Panel: Recent Labs  Lab 02/01/20 0349 02/01/20 1021 02/01/20 1631 02/01/20 1631 02/02/20 0411 02/02/20 1703 02/02/20 1735 02/03/20 0241 02/04/20 0413 02/05/20 0422  NA 142  --   --   --  144  --  143 142 142 142  K 3.6  --   --   --  3.4*  --  3.9 4.5 3.8 3.7  CL 109  --   --   --  111  --   --  109 107 106  CO2 23  --   --   --  25  --   --  24 26 26   GLUCOSE 124*  --   --   --  136*  --   --  177* 131* 124*  BUN 12  --   --   --  15  --   --  21 32* 29*  CREATININE 0.76  --   --   --  0.65  --   --  0.69 0.85 0.70  CALCIUM 9.7  --   --   --  9.5  --   --  9.9 9.9 9.4  MG  --    < > 1.9   < > 1.7 1.7  --  1.7 2.1 1.7  PHOS  --    < > 2.0*  --  2.0* 2.7  --  3.1  --  2.7   < > = values in this interval not displayed.   GFR: Estimated Creatinine Clearance: 54.6 mL/min (by C-G formula based on SCr of 0.7 mg/dL). Recent Labs  Lab 02/02/20 0411 02/03/20 0241 02/04/20 0413 02/05/20 0422  WBC 12.9* 6.9 8.9 10.6*    Liver Function Tests: No results for input(s): AST, ALT, ALKPHOS, BILITOT, PROT, ALBUMIN in the last 168 hours. No results for input(s): LIPASE, AMYLASE in the last 168 hours. No results for input(s): AMMONIA in the last 168 hours.  ABG    Component Value Date/Time   PHART 7.427 02/02/2020 1735   PCO2ART 43.3 02/02/2020 1735   PO2ART 301.0 (H) 02/02/2020 1735   HCO3 28.5 (H) 02/02/2020 1735   TCO2 30 02/02/2020 1735   ACIDBASEDEF 2.0 01/28/2020 1545   O2SAT 100.0 02/02/2020 1735     Coagulation Profile: Recent Labs  Lab 01/30/20 0955  INR 1.1    Cardiac Enzymes: No results for input(s): CKTOTAL, CKMB, CKMBINDEX, TROPONINI in the last 168  hours.  HbA1C: Hgb A1c MFr Bld  Date/Time Value Ref Range Status  01/27/2020 05:24 AM 5.1 4.8 - 5.6 % Final    Comment:    (NOTE) Pre diabetes:          5.7%-6.4% Diabetes:              >6.4% Glycemic control for   <7.0% adults with diabetes     CBG: Recent Labs  Lab 02/04/20 1927 02/04/20 2308 02/05/20 0322 02/05/20 0828 02/05/20 1150  GLUCAP 103* 110* 108* 114* 119*      I have independently seen and examined the patient, reviewed data, and developed an assessment and plan. A total of 33 minutes were spent in critical care assessment and medical decision making. This critical care time does not reflect procedure time, or teaching time or supervisory time of PA/NP/Med student/Med Resident, etc but could involve care discussion time.  Bonna Gains, MD PhD 02/05/2020, 12:35 PM

## 2020-02-05 NOTE — Progress Notes (Signed)
      [address]       [place] [address]             [phone]                 6 Days Post-Op Procedure(s) (LRB): IR WITH ANESTHESIA (N/A) S/p CABG, complicated by post-op stroke  Events: No events.   On TC this am _______________________________________________________________ Vitals: BP 129/60   Pulse 73   Temp 97.9 F (36.6 C) (Oral)   Resp 17   Ht 5\' 3"  (1.6 m)   Wt 65.8 kg   SpO2 97%   BMI 25.70 kg/m   - Neuro: arousable.  - Cardiovascular: Sinus  Drips: heparin .      - Pulm: Clear FiO2 (%):  [28 %-40 %] 28 %  ABG    Component Value Date/Time   PHART 7.427 02/02/2020 1735   PCO2ART 43.3 02/02/2020 1735   PO2ART 301.0 (H) 02/02/2020 1735   HCO3 28.5 (H) 02/02/2020 1735   TCO2 30 02/02/2020 1735   ACIDBASEDEF 2.0 01/28/2020 1545   O2SAT 100.0 02/02/2020 1735    - Abd: soft - Extremity: warm  .Intake/Output      03/20 0701 - 03/21 0700 03/21 0701 - 03/22 0700   I.V. (mL/kg) 298.6 (4.5) 39.8 (0.6)   NG/GT 1035 335   IV Piggyback 108    Total Intake(mL/kg) 1441.6 (21.9) 374.8 (5.7)   Urine (mL/kg/hr) 2035 (1.3) 400 (1.8)   Emesis/NG output  0   Chest Tube     Total Output 2035 400   Net -593.4 -25.2           _______________________________________________________________ Labs: CBC Latest Ref Rng & Units 02/05/2020 02/04/2020 02/03/2020  WBC 4.0 - 10.5 K/uL 10.6(H) 8.9 6.9  Hemoglobin 12.0 - 15.0 g/dL 8.9(L) 8.7(L) 9.0(L)  Hematocrit 36.0 - 46.0 % 27.8(L) 27.3(L) 28.1(L)  Platelets 150 - 400 K/uL 226 229 194   CMP Latest Ref Rng & Units 02/05/2020 02/04/2020 02/03/2020  Glucose 70 - 99 mg/dL 124(H) 131(H) 177(H)  BUN 8 - 23 mg/dL 29(H) 32(H) 21  Creatinine 0.44 - 1.00 mg/dL 0.70 0.85 0.69  Sodium 135 - 145 mmol/L 142 142 142  Potassium 3.5 - 5.1 mmol/L 3.7 3.8 4.5  Chloride 98 - 111 mmol/L 106 107 109  CO2 22 - 32 mmol/L 26 26 24   Calcium 8.9 - 10.3 mg/dL 9.4 9.9 9.9     CXR: pending  _______________________________________________________________  Assessment and Plan: POD 9 s/p CABG, complicated by post-op stroke  Neuro: mental status stable CV: stable, on asp, statin BB Pulm: tolerating TC Renal: good uop.  Creat stable GI: on tube feeds Heme: stable ID: [email or web address] Endo: SSI  Dispo: continue ICU care  Melodie Bouillon, MD 02/05/2020 10:20 AM

## 2020-02-05 NOTE — Progress Notes (Signed)
ANTICOAGULATION CONSULT NOTE - [place] for Heparin Indication: pulmonary embolus, stroke and DVT, and recent CABG  Allergies  Allergen Reactions  . Bee Venom Swelling    Massive swelling  . Penicillins Other (See Comments)    UNSPECIFIED REACTION  Did it involve swelling of the face/tongue/throat, SOB, or low BP? Unknown Did it involve sudden or severe rash/hives, skin peeling, or any reaction on the inside of your mouth or nose? Unknown Did you need to seek medical attention at a hospital or doctor's office? Unknown When did it last happen?1945 If all above answers are "NO", may proceed with cephalosporin use.  . Adhesive [Tape] Rash  . Morphine And Related Rash  . Sulfa Antibiotics Nausea And Vomiting and Rash    Patient Measurements: Height: 5\' 3"  (160 cm) Weight: 145 lb 1 oz (65.8 kg) IBW/kg (Calculated) : 52.4 Heparin Dosing Weight: 65.3 kg  Vital Signs: Temp: 97.6 F (36.4 C) (03/21 0400) Temp Source: Tympanic (03/21 0400) BP: 157/65 (03/21 0600) Pulse Rate: 72 (03/21 0600)  Labs: Recent Labs    02/03/20 0241 02/03/20 0241 02/04/20 0014 02/04/20 0413 02/05/20 0422  HGB 9.0*   < >  --  8.7* 8.9*  HCT 28.1*  --   --  27.3* 27.8*  PLT 194  --   --  229 226  HEPARINUNFRC 0.37   < > 0.30 0.40 0.47  CREATININE 0.69  --   --  0.85 0.70   < > = values in this interval not displayed.    Estimated Creatinine Clearance: 54.6 mL/min (by C-G formula based on SCr of 0.7 mg/dL).  Medications:  Plavix is being held as changing to full anticoagulation with IV Heparin  Assessment: 79 year old female admitted with angina on 3/11 and s/p CABG on 3/12 who had acute stroke symptoms on 3/15 AM. CTA of Head and Neck showed an acute MCA infarct and bilateral subsegmental acute pulmonary emboli. She is now s/p IR guided mechanical thrombectomy and Pharmacy has been consulted to start IV Heparin therapy. 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   HIT antibody negative, lupus anticoagulant screen negative, anti-Jo antibody neg, extractable nuclear antigen neg  She is now s/p trach, heparin restarted on 3/19 Heparin level within goal 0.47. CBC stable  Goal of Therapy:  Heparin level 0.3 to 0.5 units/ml Monitor platelets by anticoagulation protocol: Yes   Plan:  -continue heparin at 850 units/hr -daily heparin level and CBC   Vertis Kelch, PharmD, [facility] PGY2 Cardiology Pharmacy Resident Phone [phone] 02/05/2020       7:14 AM  Please check [email or web address] for unit-specific pharmacist phone numbers

## 2020-02-05 NOTE — Plan of Care (Signed)
Pt resting comfortably in bed. Hemodynamically stable. Systolic BP was higher than 150x1, but after PM metoprolol, BP within goal range. Good UOP. Pacer not in use--plan to remove from box when pt is bathed. RR and sats good, but trach site still bloody and painful (tramadol given). Plan to dangle in morning. RN will continue to monitor.   Problem: Clinical Measurements: Goal: Ability to maintain clinical measurements within normal limits will improve Outcome: Progressing Goal: Diagnostic test results will improve Outcome: Progressing Goal: Respiratory complications will improve Outcome: Progressing Goal: Cardiovascular complication will be avoided Outcome: Progressing   Problem: Nutrition: Goal: Adequate nutrition will be maintained Outcome: Progressing   Problem: Coping: Goal: Level of anxiety will decrease Outcome: Progressing   Problem: Skin Integrity: Goal: Risk for impaired skin integrity will decrease Outcome: Progressing   Problem: Cardiac: Goal: Will achieve and/or maintain hemodynamic stability Outcome: Progressing

## 2020-02-06 ENCOUNTER — Inpatient Hospital Stay (HOSPITAL_COMMUNITY): Payer: PPO

## 2020-02-06 DIAGNOSIS — I5021 Acute systolic (congestive) heart failure: Secondary | ICD-10-CM

## 2020-02-06 LAB — CBC
HCT: 27.6 % — ABNORMAL LOW (ref 36.0–46.0)
Hemoglobin: 8.7 g/dL — ABNORMAL LOW (ref 12.0–15.0)
MCH: 29.5 pg (ref 26.0–34.0)
MCHC: 31.5 g/dL (ref 30.0–36.0)
MCV: 93.6 fL (ref 80.0–100.0)
Platelets: 252 10*3/uL (ref 150–400)
RBC: 2.95 MIL/uL — ABNORMAL LOW (ref 3.87–5.11)
RDW: 14.6 % (ref 11.5–15.5)
WBC: 11.4 10*3/uL — ABNORMAL HIGH (ref 4.0–10.5)
nRBC: 0 % (ref 0.0–0.2)

## 2020-02-06 LAB — BASIC METABOLIC PANEL
Anion gap: 7 (ref 5–15)
BUN: 21 mg/dL (ref 8–23)
CO2: 27 mmol/L (ref 22–32)
Calcium: 9.4 mg/dL (ref 8.9–10.3)
Chloride: 104 mmol/L (ref 98–111)
Creatinine, Ser: 0.61 mg/dL (ref 0.44–1.00)
GFR calc Af Amer: >60 mL/min (ref >60)
GFR calc non Af Amer: >60 mL/min (ref >60)
Glucose, Bld: 126 mg/dL — ABNORMAL HIGH (ref 70–99)
Potassium: 4.1 mmol/L (ref 3.5–5.1)
Sodium: 138 mmol/L (ref 135–145)

## 2020-02-06 LAB — HEPARIN LEVEL (UNFRACTIONATED): Heparin Unfractionated: 0.29 IU/mL — ABNORMAL LOW (ref 0.30–0.70)

## 2020-02-06 LAB — PHOSPHORUS: Phosphorus: 2.7 mg/dL (ref 2.5–4.6)

## 2020-02-06 LAB — GLUCOSE, CAPILLARY
Glucose-Capillary: 103 mg/dL — ABNORMAL HIGH (ref 70–99)
Glucose-Capillary: 113 mg/dL — ABNORMAL HIGH (ref 70–99)
Glucose-Capillary: 116 mg/dL — ABNORMAL HIGH (ref 70–99)
Glucose-Capillary: 125 mg/dL — ABNORMAL HIGH (ref 70–99)
Glucose-Capillary: 126 mg/dL — ABNORMAL HIGH (ref 70–99)
Glucose-Capillary: 126 mg/dL — ABNORMAL HIGH (ref 70–99)

## 2020-02-06 LAB — ECHOCARDIOGRAM LIMITED
Height: 63 in
Weight: 2278.67 oz

## 2020-02-06 LAB — MAGNESIUM: Magnesium: 1.8 mg/dL (ref 1.7–2.4)

## 2020-02-06 MED ORDER — LIDOCAINE HCL 1 % IJ SOLN
INTRAMUSCULAR | Status: AC
  Filled 2020-02-06: qty 20

## 2020-02-06 MED ORDER — ENOXAPARIN SODIUM 80 MG/0.8ML ~~LOC~~ SOLN
1.0000 mg/kg | Freq: Two times a day (BID) | SUBCUTANEOUS | Status: DC
  Administered 2020-02-06 – 2020-02-15 (×18): 65 mg via SUBCUTANEOUS
  Filled 2020-02-06 (×3): qty 0.8
  Filled 2020-02-06 (×2): qty 0.65
  Filled 2020-02-06 (×2): qty 0.8
  Filled 2020-02-06: qty 0.65
  Filled 2020-02-06 (×4): qty 0.8
  Filled 2020-02-06: qty 0.65
  Filled 2020-02-06 (×4): qty 0.8
  Filled 2020-02-06 (×2): qty 0.65

## 2020-02-06 MED ORDER — LEVETIRACETAM 100 MG/ML PO SOLN
500.0000 mg | Freq: Two times a day (BID) | ORAL | Status: DC
  Administered 2020-02-06: 500 mg
  Filled 2020-02-06 (×2): qty 5

## 2020-02-06 NOTE — Progress Notes (Signed)
Occupational Therapy Treatment Patient Details Name: [name] [name] MRN: [id number] DOB: 1941/05/28 Today's Date: 02/06/2020    History of present illness Pt is a 79 y.o. female admitted 01/26/20 after cardiac cath showed severe vessel disease; s/p CABG x4 on 3/13. 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 Pt with bilateral PEs. Taken to IR and found to have L M1/MCA occlusion, s/p emergent mechanical thrombectomy. ETT 3/12-3/13 (for procedure), 3/15-3/19. S/p trach 3/19. PMH includes CAD, HTN, THA, osteoporosis.   OT comments  Pt progressing to OOB ADL and mobility. Pt using stedy for easier transfer and pt's side stepping more difficult than taking steps forward. Pt washing face with LUE as RUE swelling. Pt following commands and nodding head or shaking head to questions as pt still with trach collar. Pt modA +2 for mobility. Pt would greatly benefit from continued OT skilled services. OT following acutely.    Follow Up Recommendations  CIR;Supervision/Assistance - 24 hour    Equipment Recommendations  Other (comment)(TBD)    Recommendations for Other Services      Precautions / Restrictions Precautions Precautions: Sternal;Fall;Other (comment) Precaution Booklet Issued: No Precaution Comments: R-side weakness, cortrak Restrictions Weight Bearing Restrictions: Yes Other Position/Activity Restrictions: sternal precs       Mobility Bed Mobility Overal bed mobility: Needs Assistance Bed Mobility: Supine to Sit     Supine to sit: Mod assist;HOB elevated;+2 for safety/equipment     General bed mobility comments: Assist to bring legs off of bed, elevate trunk into sitting and bring hips to EOB.   Transfers Overall transfer level: Needs assistance Equipment used: 2 person hand held assist;Rolling walker (2 wheeled);Ambulation equipment used Transfers: Sit to/from Stand Sit to Stand: +2  physical assistance;Mod assist         General transfer comment: Pt sit to stand x1 from bed and able to take shuffling steps, but usign stedy for ease of transfer. Sit to stand x2 times from recliner with modA for power up    Balance Overall balance assessment: Needs assistance Sitting-balance support: Single extremity supported;Feet supported Sitting balance-Leahy Scale: Poor Sitting balance - Comments: UE support and min guard to min assist for static sitting   Standing balance support: Bilateral upper extremity supported Standing balance-Leahy Scale: Poor Standing balance comment: walker or HHA and min assist for static standing                           ADL either performed or assessed with clinical judgement   ADL Overall ADL's : Needs assistance/impaired Eating/Feeding: NPO Eating/Feeding Details (indicate cue type and reason): cortrak Grooming: Moderate assistance;Sitting Grooming Details (indicate cue type and reason): LUE used                 Toilet Transfer: Moderate assistance;+2 for physical assistance;+2 for safety/equipment;Stand-pivot(with stedy) Toilet Transfer Details (indicate cue type and reason): simulated with transfer to recliner         Functional mobility during ADLs: Moderate assistance;+2 for physical assistance;+2 for safety/equipment General ADL Comments: Pt with decreased activity tolerance, decreased strength and decreased ability to care for self.     Vision   Vision Assessment?: Vision impaired- to be further tested in functional context Additional Comments: Continue to assess vision   Perception     Praxis      Cognition Arousal/Alertness: Awake/alert Behavior During Therapy: WFL for tasks assessed/performed Overall Cognitive Status:  Difficult to assess Area of Impairment: Following commands;Safety/judgement;Problem solving                       Following Commands: Follows one step commands  consistently;Follows one step commands with increased time Safety/Judgement: Decreased awareness of safety;Decreased awareness of deficits   Problem Solving: Slow processing;Decreased initiation;Requires verbal cues;Requires tactile cues          Exercises     Shoulder Instructions       General Comments Pt on trach collar 5L O2 at 50% FiO2. VSS.    Pertinent Vitals/ Pain       Pain Assessment: Faces Faces Pain Scale: Hurts a little bit Pain Location: chest Pain Descriptors / Indicators: Grimacing Pain Intervention(s): Monitored during session  Home Living Family/patient expects to be discharged to:: Private residence Living Arrangements: Alone Available Help at Discharge: Family;Available PRN/intermittently Type of Home: House Home Access: Stairs to enter [employer] of Steps: 3-4 Entrance Stairs-Rails: Right Home Layout: One level                   Additional Comments: Limited info due to pt not verbalizing. Nodding/shaking head appropriately      Prior Functioning/Environment Level of Independence: Independent            Frequency  Min 3X/week        Progress Toward Goals  OT Goals(current goals can now be found in the care plan section)  Progress towards OT goals: Progressing toward goals  Acute Rehab OT Goals Patient Stated Goal: Pt unable to state - agreeable to participate OT Goal Formulation: Patient unable to participate in goal setting Time For Goal Achievement: 02/16/20 Potential to Achieve Goals: Good ADL Goals Pt Will Perform Grooming: with min assist;standing Pt Will Transfer to Toilet: with min assist;stand pivot transfer;bedside commode Pt/caregiver will Perform Home Exercise Program: Increased strength;Right Upper extremity;With written HEP provided;With Supervision Additional ADL Goal #1: Pt will complete BADL task with min A and <3 VC's for problem solving Additional ADL Goal #2: Pt will verbalize sternal  precautions with <3 VC's while completing BADL task  Plan Discharge plan remains appropriate    Co-evaluation    PT/OT/SLP Co-Evaluation/Treatment: Yes Reason for Co-Treatment: Complexity of the patient's impairments (multi-system involvement);To address functional/ADL transfers PT goals addressed during session: Mobility/safety with mobility;Balance OT goals addressed during session: ADL's and self-care      AM-PAC OT "6 Clicks" Daily Activity     Outcome Measure   Help from another person eating meals?: (cortrak) Help from another person taking care of personal grooming?: A Lot Help from another person toileting, which includes using toliet, bedpan, or urinal?: A Lot Help from another person bathing (including washing, rinsing, drying)?: A Lot Help from another person to put on and taking off regular upper body clothing?: A Lot Help from another person to put on and taking off regular lower body clothing?: A Lot 6 Click Score: 10    End of Session Equipment Utilized During Treatment: Gait belt;Rolling walker;Other (comment);Oxygen(stedy)  OT Visit Diagnosis: Unsteadiness on feet (R26.81);Other abnormalities of gait and mobility (R26.89);Muscle weakness (generalized) (M62.81);Hemiplegia and hemiparesis Hemiplegia - Right/Left: Right Hemiplegia - dominant/non-dominant: Dominant Hemiplegia - caused by: Cerebral infarction   Activity Tolerance Patient limited by fatigue   Patient Left in chair;with call bell/phone within reach;with chair alarm set;with family/visitor present   Nurse Communication Mobility status        Time: [id number] OT Time Calculation (min): 41 min  Charges: OT General Charges $OT Visit: 1 Visit OT Treatments $Self Care/Home Management : 8-22 mins  Jefferey Pica, OTR/L Acute Rehabilitation Services Pager: [phone] Office: [phone]    Chrisangel Eskenazi C 02/06/2020, 5:31 PM

## 2020-02-06 NOTE — Progress Notes (Signed)
      [address]       [place] [address]             [phone]      Asleep currently  BP (!) 117/47   Pulse 92   Temp 98.3 F (36.8 C)   Resp (!) 28   Ht 5\' 3"  (1.6 m)   Wt 64.6 kg   SpO2 93%   BMI 25.23 kg/m   Intake/Output Summary (Last 24 hours) at 02/06/2020 1720 Last data filed at 02/06/2020 1600 Gross per 24 hour  Intake 1305.91 ml  Output 1845 ml  Net -539.09 ml   Continue PT, OT, Speech  Remo Lipps C. Roxan Hockey, MD [facility] [phone]

## 2020-02-06 NOTE — Progress Notes (Signed)
  Echocardiogram 2D Echocardiogram has been performed.  [name] [name] 02/06/2020, 9:51 AM

## 2020-02-06 NOTE — Progress Notes (Signed)
7 Days Post-Op Procedure(s) (LRB): IR WITH ANESTHESIA (N/A) Subjective: thirsty  Objective: Vital signs in last 24 hours: Temp:  [97.6 F (36.4 C)-99.2 F (37.3 C)] 99.2 F (37.3 C) (03/22 1109) Pulse Rate:  [65-85] 69 (03/22 1200) Cardiac Rhythm: Normal sinus rhythm (03/22 1200) Resp:  [13-24] 16 (03/22 1200) BP: (117-145)/(49-73) 128/50 (03/22 1200) SpO2:  [95 %-100 %] 99 % (03/22 1200) FiO2 (%):  [28 %] 28 % (03/22 0321) Weight:  [64.6 kg] 64.6 kg (03/22 0500)  Hemodynamic parameters for last 24 hours:    Intake/Output from previous day: 03/21 0701 - 03/22 0700 In: 1763.6 [I.V.:378.6; NG/GT:1385] Out: 2275 [Urine:2275] Intake/Output this shift: Total I/O In: 268.9 [I.V.:43.9; NG/GT:225] Out: 325 [Urine:325]  General appearance: alert and cooperative Neurologic: right sided weakness Heart: regular rate and rhythm, S1, S2 normal, no murmur, click, rub or gallop Lungs: clear to auscultation bilaterally Abdomen: soft, non-tender; bowel sounds normal; no masses,  no organomegaly Extremities: extremities normal, atraumatic, no cyanosis or edema Wound: dressed, dry  Lab Results: Recent Labs    02/05/20 0422 02/06/20 0409  WBC 10.6* 11.4*  HGB 8.9* 8.7*  HCT 27.8* 27.6*  PLT 226 252   BMET:  Recent Labs    02/05/20 0422 02/06/20 0409  NA 142 138  K 3.7 4.1  CL 106 104  CO2 26 27  GLUCOSE 124* 126*  BUN 29* 21  CREATININE 0.70 0.61  CALCIUM 9.4 9.4    PT/INR: No results for input(s): LABPROT, INR in the last 72 hours. ABG    Component Value Date/Time   PHART 7.427 02/02/2020 1735   HCO3 28.5 (H) 02/02/2020 1735   TCO2 30 02/02/2020 1735   ACIDBASEDEF 2.0 01/28/2020 1545   O2SAT 100.0 02/02/2020 1735   CBG (last 3)  Recent Labs    02/06/20 0408 02/06/20 0816 02/06/20 1107  GLUCAP 125* 126* 126*    Assessment/Plan: S/P Procedure(s) (LRB): IR WITH ANESTHESIA (N/A) Mobilize Diuresis agressive rehab  Consider right thoracentesis   LOS: 11  days    [name] 02/06/2020

## 2020-02-06 NOTE — Evaluation (Signed)
Passy-Muir Speaking Valve - Evaluation Patient Details  Name: [name] [name] MRN: [id number] Date of Birth: [birth year]  Today's Date: 02/06/2020 Time: 1115-1130 SLP Time Calculation (min) (ACUTE ONLY): 15 min  Past Medical History:  Past Medical History:  Diagnosis Date  . Coronary artery disease   . Family history of adverse reaction to anesthesia    " My Sister did "  . HTN (hypertension)   . Hyperlipidemia   . Hypothyroidism   . Osteoporosis   . 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 ABDOMINAL HYSTERECTOMY    . ANKLE SURGERY    . APPENDECTOMY    . CARDIAC CATHETERIZATION  01/25/2010  . CHOLECYSTECTOMY    . CORONARY ARTERY BYPASS GRAFT N/A 01/27/2020   Procedure: CORONARY ARTERY BYPASS GRAFTING (CABG) x 4 using LIMA to LAD; RIMA to OM1; Left radia sequential PDA and Left PL.;  Surgeon: [name], MD;  Location: [facility] OR;  Service: Open Heart Surgery;  Laterality: N/A;  BILATERAL IMA  . gamma knife    . IR CT HEAD LTD  01/30/2020  . IR PERCUTANEOUS ART THROMBECTOMY/INFUSION INTRACRANIAL INC DIAG ANGIO  01/30/2020  . LEFT HEART CATH AND CORONARY ANGIOGRAPHY N/A 01/26/2020   Procedure: LEFT HEART CATH AND CORONARY ANGIOGRAPHY;  Surgeon: Belva Crome, MD;  Location: [facility];  Service: Cardiovascular;  Laterality: N/A;  . RADIAL ARTERY HARVEST Left 01/27/2020   Procedure: RADIAL ARTERY HARVEST (OPEN HARVEST);  Surgeon: [name], MD;  Location: [place];  Service: Open Heart Surgery;  Laterality: Left;  . RADIOLOGY WITH ANESTHESIA N/A 01/30/2020   Procedure: IR WITH ANESTHESIA;  Surgeon: Radiologist, Medication, MD;  Location: [place];  Service: Radiology;  Laterality: N/A;  . TEE WITHOUT CARDIOVERSION N/A 01/27/2020   Procedure: TRANSESOPHAGEAL ECHOCARDIOGRAM (TEE);  Surgeon: [name], MD;  Location: [place];  Service: Open Heart Surgery;  Laterality: N/A;  . THYROIDECTOMY    . TOTAL HIP  ARTHROPLASTY     HPI:   79 y.o. female admitted 01/26/20 after cardiac cath showed severe vessel disease; s/p CABG x4 on 3/13. 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 Pt with bilateral PEs. Taken to IR and found to have L M1/MCA occlusion, s/p emergent mechanical thrombectomy. ETT 3/12-3/13 (for procedure), 3/15-3/19. S/p trach 3/19. PMH includes CAD, HTN, THA, osteoporosis.   Assessment / Plan / Recommendation Clinical Impression  Pt assessed for toleration of PMV.  Currently with #6 cuffed trach, partially deflated; pt on trach collar. Alert, able to follow simple, one-step commands and mouths yes/no in response to questions re: basic comfort.  Her nephew, [name] [name], was at bedside.  Cuff deflated - removing 3 ml of remaining air.  Pt's trach and surrounding area covered in dried blood; secretions bloody.  VS: RR 23, SP02 97%, HR 69.  Valve placed for intervals of 2-3 respiratory cycles. Pt had difficulty accessing upper airway; removal of valve revealed air-trapping, resulting in immediate rush of air from trach.  Cues for vocalization, airway access were not effective today.  Pt with ongoing cough, production of secretions and inability to phonate.  Purpose and function of PMV were explained to [facility].  Recommend PMV trials with SLP only; will f/u frequently for PMV usage, readiness for swallow study, and speech/language evaluation.   SLP Visit Diagnosis: Aphonia (R49.1)    SLP Assessment  Patient needs continued Speech Lanaguage Pathology Services    Follow Up Recommendations  Inpatient Rehab    Frequency and Duration min 3x week  2 weeks    PMSV Trial PMSV was placed for: intervals of 2-3 respiratory cycles Able to redirect subglottic air through upper airway: No Able to Attain Phonation: No Able to Expectorate Secretions: Yes Level of Secretion Expectoration with PMSV: Oral Breath Support for Phonation:  Severely decreased Intelligibility: Unable to assess (comment) Respirations During Trial: 23 SpO2 During Trial: 97 % Pulse During Trial: 69 Behavior: Alert   Tracheostomy Tube  Additional Tracheostomy Tube Assessment Fenestrated: No    Vent Dependency  Vent Dependent: No    Cuff Deflation Trial  GO Tolerated Cuff Deflation: Yes Length of Time for Cuff Deflation Trial: 12 Behavior: Alert        [name] Laurice 02/06/2020, 11:41 AM Estill Bamberg L. Tivis Ringer, [place] Office number [phone] Pager [phone]

## 2020-02-06 NOTE — Progress Notes (Signed)
Physical Therapy Treatment Patient Details Name: [name] [name] MRN: [id number] DOB: 09-Aug-1941 Today's Date: 02/06/2020    History of Present Illness Pt is a 79 y.o. female admitted 01/26/20 after cardiac cath showed severe vessel disease; s/p CABG x4 on 3/13. 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 Pt with bilateral PEs. Taken to IR and found to have L M1/MCA occlusion, s/p emergent mechanical thrombectomy. ETT 3/12-3/13 (for procedure), 3/15-3/19. S/p trach 3/19. PMH includes CAD, HTN, THA, osteoporosis.    PT Comments    Pt making steady progress. Continue to recommend CIR.    Follow Up Recommendations  CIR;Supervision/Assistance - 24 hour     Equipment Recommendations  (TBD)    Recommendations for Other Services       Precautions / Restrictions Precautions Precautions: Sternal;Fall;Other (comment) Precaution Booklet Issued: No Precaution Comments: R-side weakness Restrictions Weight Bearing Restrictions: Yes    Mobility  Bed Mobility Overal bed mobility: Needs Assistance Bed Mobility: Supine to Sit     Supine to sit: Mod assist;HOB elevated;+2 for safety/equipment     General bed mobility comments: Assist to bring legs off of bed, elevate trunk into sitting and bring hips to EOB.   Transfers Overall transfer level: Needs assistance Equipment used: 2 person hand held assist;Rolling walker (2 wheeled);Ambulation equipment used Transfers: Sit to/from Stand Sit to Stand: +2 physical assistance;Mod assist         General transfer comment: Assist to bring hips up and for balance. Stood from bed with HHA but unable to make pivotal steps to recliner. Sat back down and used Stedy for bed to chair. Then stood from chair with rolling walker for amb.  Ambulation/Gait Ambulation/Gait assistance: +2 physical assistance;Mod assist;Min assist Gait Distance (Feet): 6 Feet Assistive  device: Rolling walker (2 wheeled) Gait Pattern/deviations: Step-to pattern;Decreased step length - right;Decreased step length - left;Shuffle;Trunk flexed Gait velocity: decr Gait velocity interpretation: <1.31 ft/sec, indicative of household ambulator General Gait Details: Assist for balance and support.    Stairs             Wheelchair Mobility    Modified Rankin (Stroke Patients Only) Modified Rankin (Stroke Patients Only) Pre-Morbid Rankin Score: No symptoms Modified Rankin: Moderately severe disability     Balance Overall balance assessment: Needs assistance Sitting-balance support: Single extremity supported;Feet supported Sitting balance-Leahy Scale: Poor Sitting balance - Comments: UE support and min guard to min assist for static sitting   Standing balance support: Bilateral upper extremity supported Standing balance-Leahy Scale: Poor Standing balance comment: walker or HHA and min assist for static standing                            Cognition Arousal/Alertness: Awake/alert Behavior During Therapy: WFL for tasks assessed/performed Overall Cognitive Status: Difficult to assess Area of Impairment: Following commands;Safety/judgement;Problem solving                       Following Commands: Follows one step commands consistently;Follows one step commands with increased time Safety/Judgement: Decreased awareness of safety;Decreased awareness of deficits   Problem Solving: Slow processing;Decreased initiation;Requires verbal cues;Requires tactile cues        Exercises      General Comments General comments (skin integrity, edema, etc.): Pt on trach collar. VSS.      Pertinent Vitals/Pain Pain Assessment: Faces Faces Pain Scale: No hurt  Home Living Family/patient expects to be discharged to:: Private residence Living Arrangements: Alone Available Help at Discharge: Family;Available PRN/intermittently Type of Home: House Home  Access: Stairs to enter Entrance Stairs-Rails: Right Home Layout: One level   Additional Comments: Limited info due to pt not verbalizing. Nodding/shaking head appropriately    Prior Function Level of Independence: Independent          PT Goals (current goals can now be found in the care plan section) Acute Rehab PT Goals Patient Stated Goal: Pt unable to state - agreeable to participate Progress towards PT goals: Progressing toward goals    Frequency    Min 4X/week      PT Plan Current plan remains appropriate    Co-evaluation PT/OT/SLP Co-Evaluation/Treatment: Yes Reason for Co-Treatment: Complexity of the patient's impairments (multi-system involvement);For patient/therapist safety PT goals addressed during session: Mobility/safety with mobility;Balance        AM-PAC PT "6 Clicks" Mobility   Outcome Measure  Help needed turning from your back to your side while in a flat bed without using bedrails?: A Lot Help needed moving from lying on your back to sitting on the side of a flat bed without using bedrails?: A Lot Help needed moving to and from a bed to a chair (including a wheelchair)?: A Lot Help needed standing up from a chair using your arms (e.g., wheelchair or bedside chair)?: A Lot Help needed to walk in hospital room?: A Lot Help needed climbing 3-5 steps with a railing? : Total 6 Click Score: 11    End of Session Equipment Utilized During Treatment: Oxygen;Gait belt Activity Tolerance: Patient tolerated treatment well Patient left: with call bell/phone within reach;in chair;with chair alarm set;with family/visitor present Nurse Communication: Mobility status;Need for lift equipment PT Visit Diagnosis: Other abnormalities of gait and mobility (R26.89);Hemiplegia and hemiparesis Hemiplegia - Right/Left: Right Hemiplegia - dominant/non-dominant: Dominant Hemiplegia - caused by: Cerebral infarction     Time: 1208-1246 PT Time Calculation (min) (ACUTE  ONLY): 38 min  Charges:  $Therapeutic Activity: 23-37 mins                     Starr Pager [phone] Office [place] 02/06/2020, 3:42 PM

## 2020-02-06 NOTE — Progress Notes (Signed)
Patient brought to radiology for possible right thoracentesis.  Patient is following commands, but does not participate in care and is unable to communicate.   Limited US Chest shows only a trace amount of fluid not amenable to thoracentesis at this time.   Patient returned to unit.   Brynda Greathouse, MS RD PA-C 2:16 PM

## 2020-02-06 NOTE — Progress Notes (Signed)
Referring Physician(s): Code Stroke- [name]  Supervising Physician: Pedro Earls  Patient Status:  [name] [name] - In-pt  Chief Complaint: None  Subjective:  History of acute CVA s/p cerebral arteriogram with emergent mechanical thrombectomy of left MCA M1 occlusion achieving a TICI 3 revascularization 01/30/2020 by Dr. Karenann Cai. Patient awake and alert laying in bed with tracheostomy in place. She follows simple commands. Can spontaneously move all extremities. Right groin incision c/d/i.   Allergies: Bee venom, Penicillins, Adhesive [tape], Morphine and related, and Sulfa antibiotics  Medications: Prior to Admission medications   Medication Sig Start Date End Date Taking? Authorizing Provider  acetaminophen (TYLENOL) 500 MG tablet Take 1,000 mg by mouth in the morning and at bedtime.    Yes [provider]  aspirin EC 81 MG tablet Take 81 mg by mouth at bedtime.   Yes [provider]  atorvastatin (LIPITOR) 80 MG tablet Take 80 mg by mouth daily at 12 noon.    Yes [provider]  cholecalciferol (VITAMIN D) 1000 UNITS tablet Take 1,000 Units by mouth daily at 12 noon.    Yes [provider]  fluticasone (FLONASE) 50 MCG/ACT nasal spray Place 2 sprays into both nostrils daily. 12/19/19  Yes [provider]  levothyroxine (SYNTHROID, LEVOTHROID) 200 MCG tablet Take 100-200 mcg by mouth See admin instructions. Take 200 mcg  SUN-TUES-THUR-SAT Take 100 mcg tab MON-WED-FRI  DO NOT USE GENERIC   Yes [provider]  lisinopril (ZESTRIL) 20 MG tablet Take 20 mg by mouth daily.    Yes [provider]  metoprolol tartrate (LOPRESSOR) 25 MG tablet Take 1 tablet (25 mg total) by mouth 2 (two) times daily. Patient taking differently: Take 25 mg by mouth 2 (two) times daily after a meal.  01/04/20 04/03/20 Yes [name], [name], MD  nitroGLYCERIN (NITROSTAT) 0.4 MG SL tablet Place 1 tablet (0.4 mg  total) under the tongue every 5 (five) minutes as needed for chest pain. 11/22/19 02/20/20 Yes [name], [name], MD  Prenatal Vit-Fe Fumarate-FA (PRENATAL MULTIVITAMIN) TABS tablet Take 1 tablet by mouth daily at 12 noon.   Yes [provider]  vitamin B-12 (CYANOCOBALAMIN) 1000 MCG tablet Take 2,500 mcg by mouth daily.   Yes [provider]     Vital Signs: BP 127/73   Pulse 73   Temp 99 F (37.2 C)   Resp 14   Ht 5\' 3"  (1.6 m)   Wt 142 lb 6.7 oz (64.6 kg)   SpO2 100%   BMI 25.23 kg/m   Physical Exam Vitals and nursing note reviewed.  Constitutional:      General: She is not in acute distress.    Comments: Tracheostomy.  Pulmonary:     Effort: Pulmonary effort is normal. No respiratory distress.     Comments: Tracheostomy. Skin:    General: Skin is warm and dry.     Comments: Right groin incision soft without active bleeding or hematoma.   Neurological:     Mental Status: She is alert.     Comments: Alert and awake with tracheostomy in place. She follows simple commands. PERRL bilaterally. Can spontaneously move all extremities.  Psychiatric:     Comments: Tracheostomy.     Imaging: DG Chest Port 1 View  Result Date: 02/06/2020 CLINICAL DATA:  CABG x4.  Tracheostomy EXAM: PORTABLE CHEST 1 VIEW COMPARISON:  02/04/2020. FINDINGS: Tracheostomy tube and feeding tube unchanged. PICC line unchanged. Normal cardiac silhouette. There are bilateral pleural  effusions unchanged in volume. Mild central venous congestion. No pneumothorax. IMPRESSION: 1. No interval change. Stable support apparatus. 2. Bilateral moderate pleural effusions. Electronically Signed   By: Suzy Bouchard M.D.   On: 02/06/2020 09:19   DG Chest Port 1 View  Result Date: 02/04/2020 CLINICAL DATA:  History of open heart surgery. EXAM: PORTABLE CHEST 1 VIEW COMPARISON:  Chest radiograph 02/03/2020 FINDINGS: Unchanged position of a tracheostomy tube. Enteric tube passes below the level left  hemidiaphragm with tip excluded from the field of view. Right-sided PICC terminating in the region of the superior cavoatrial junction. Unchanged mild cardiomegaly. Persistent moderate right pleural effusion with right basilar atelectasis and/or consolidation. No definite left pleural effusion. The left lung is clear. Overlying cardiac monitoring leads. IMPRESSION: Stable examination as compared to 02/03/2020. Unchanged moderate right pleural effusion with right basilar atelectasis and/or consolidation. Mild cardiomegaly. Aortic atherosclerosis. Electronically Signed   By: Kellie Simmering DO   On: 02/04/2020 09:10   DG Chest Port 1 View  Result Date: 02/03/2020 CLINICAL DATA:  79 year old female with history of CABG. EXAM: PORTABLE CHEST 1 VIEW COMPARISON:  Chest x-ray 02/03/2020. FINDINGS: Previously noted endotracheal tube has been removed and there is a new tracheostomy tube in place with tip 4.1 cm above the carina. There is a right upper extremity PICC with tip terminating in the distal superior vena cava. A nasogastric tube is seen extending into the stomach, however, the tip of the nasogastric tube extends below the lower margin of the image. Moderate right pleural effusion with atelectasis and/or consolidation in the right lung base. Left lung appears clear. No definite left pleural effusion. No evidence of pulmonary edema. Heart size is mildly enlarged. Upper mediastinal contours are distorted by patient's rotation to the right. Aortic atherosclerosis. IMPRESSION: 1. Support apparatus, as above. 2. Enlarging moderate right pleural effusion with atelectasis and/or consolidation in the right lung base. 3. Mild cardiomegaly. 4. Aortic atherosclerosis. Electronically Signed   By: Vinnie Langton M.D.   On: 02/03/2020 17:19   DG CHEST PORT 1 VIEW  Result Date: 02/03/2020 CLINICAL DATA:  Acute hypoxic respiratory failure EXAM: PORTABLE CHEST 1 VIEW COMPARISON:  Yesterday FINDINGS: Endotracheal tube tip is  just below the clavicular heads. The feeding tube at least reaches the stomach. Right PICC with tip at the upper cavoatrial junction. Cardiomegaly. Elevated right diaphragm with basal atelectasis. Chest tubes in place. No visible pneumothorax. IMPRESSION: Stable hardware positioning and right base atelectasis. Electronically Signed   By: Monte Fantasia M.D.   On: 02/03/2020 09:35   DG CHEST PORT 1 VIEW  Result Date: 02/02/2020 CLINICAL DATA:  Endotracheal tube placement. EXAM: PORTABLE CHEST 1 VIEW COMPARISON:  02/02/2020 FINDINGS: Endotracheal tube tip projects 2.5 cm above the carina, well positioned and unchanged. Right internal jugular introducer sees, right internal jugular central venous line are both stable from the prior exam. Enteric tube passes well below the diaphragm least into the distal stomach. New right PICC, tip projecting in the lower superior vena cava. Left chest tube is stable. Cardiac silhouette is mildly enlarged. There is lung base opacity, greater on the right, consistent with a combination of small pleural effusions and atelectasis. Consider pneumonia if there are consistent clinical findings. No evidence of pulmonary edema. No pneumothorax. IMPRESSION: 1. Right PICC placed since the study obtained earlier today, tip projecting in the lower superior vena cava. 2. No other change. Other support apparatus is stable and well positioned. 3. Persistent lung base opacity, greater on the right, consistent  with a combination of small effusions and atelectasis. Pneumonia is not excluded. Electronically Signed   By: Lajean Manes M.D.   On: 02/02/2020 16:58   [place] EKG SITE RITE  Result Date: 02/02/2020 If Site Rite image not attached, placement could not be confirmed due to current cardiac rhythm.   Labs:  CBC: Recent Labs    02/03/20 0241 02/04/20 0413 02/05/20 0422 02/06/20 0409  WBC 6.9 8.9 10.6* 11.4*  HGB 9.0* 8.7* 8.9* 8.7*  HCT 28.1* 27.3* 27.8* 27.6*  PLT 194 229 226 252     COAGS: Recent Labs    01/27/20 0524 01/27/20 2048 01/28/20 0625 01/30/20 0955  INR 1.0 1.5* 1.1 1.1  APTT 76* 33 30  --     BMP: Recent Labs    02/03/20 0241 02/04/20 0413 02/05/20 0422 02/06/20 0409  NA 142 142 142 138  K 4.5 3.8 3.7 4.1  CL 109 107 106 104  CO2 24 26 26 27   GLUCOSE 177* 131* 124* 126*  BUN 21 32* 29* 21  CALCIUM 9.9 9.9 9.4 9.4  CREATININE 0.69 0.85 0.70 0.61  GFRNONAA >60 >60 >60 >60  GFRAA >60 >60 >60 >60     Assessment and Plan:  History of acute CVA s/p cerebral arteriogram with emergent mechanical thrombectomy of left MCA M1 occlusion achieving a TICI 3 revascularization 01/30/2020 by Dr. Karenann Cai. Patient's condition slightly improved- awake and alert with tracheostomy in place, follows simple commands, can spontaneously move all extremities. Right groin incision stable. Plan to follow-up with Dr. Karenann Cai in clinic 1 month after discharge- order placed to facilitate this. Further plans per TCTS/neurology/CCM- appreciate and agree with management. NIR will follow peripherally, please call NIR with questions/concerns.   Electronically Signed: Earley Abide, PA-C 02/06/2020, 9:31 AM   I spent a total of 25 Minutes at the the patient's bedside AND on the patient's [name] floor or unit, greater than 50% of which was counseling/coordinating care for left MCA M1 occlusion s/p revascularization.

## 2020-02-06 NOTE — Progress Notes (Addendum)
NAME:  [name] [name], MRN:  [id number], DOB:  March 17, 1941, LOS: 11 ADMISSION DATE:  01/26/2020, CONSULTATION DATE:  01/30/2020 REFERRING MD:  Rory Percy CHIEF COMPLAINT:  Right hemiplegia   Brief History        This 79 year old was admitted on 3/8 for evaluation of chest pain.  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  She was up ambulating today and suddenly developed a right hemiplegia.  CTA showed an M1 occlusion and thrombectomy was performed.  An attempt was made to extubate the patient post procedure but this was not tolerated.  Of note at the time the CTA was performed she was also found to have pulmonary emboli.  History of present illness       History is as noted above with the following additions.  Intraoperative TEE showed essentially normal LV systolic dysfunction there was some LVH and a PFO with a left to right flow.  There is no thrombus noted in the left atrial appendage.  Past Medical History  Thyroid cancer, hypertension, hyperlipidemia.  Significant Hospital Events   Cardiac cath 3/12, CABG 3/13  Consults:  Thoracic surgery, neurology, interventional radiology.  Procedures:  Four-vessel CABG, left M1 thrombectomy 3/19 trach  Significant Diagnostic Tests:  3/15 LE venous doppler:  RIGHT:  - There is no evidence of deep vein thrombosis in the lower extremity.    - No cystic structure found in the popliteal fossa.    LEFT:  - Findings consistent with acute deep vein thrombosis involving the left  posterior tibial veins.  - No cystic structure found in the popliteal fossa.  3/15 echo: LVEF [id number], grade 1 diastolic dysfunction, LA dilation, RVSP mildly elevated 3/16 cth: No acute intracranial hemorrhage. Likely evolving recent infarction of the lentiform nucleus. 3/15 cta head/neck 1. Emergent large vessel occlusion at the left M1 segment. 2. Aspects of 10 with CT perfusion suggesting core  infarct of 74 cc and 85 cc of penumbra. 3. Flow reducing left ICA bulb stenosis. 4. Bilateral subsegmental acute pulmonary emboli. 5. Proximal occlusion of the non dominant left vertebral artery with reconstitution at the dura. 3/16 eeg: This study showed evidence of epileptogenicity arising from left frontotemporal as well as cortical dysfunction in left hemisphere likely secondary to underlying stroke. No seizures were seen throughout the recording.  Micro Data:  3/8 sars2: neg  Antimicrobials:  None   Interim history/subjective:  3/20 no acute events o/n. Awakens easily and interactive. 3/19: reintubated yseterday afternoon with some difficulty 2/2 severe airway edema. Plan for trach today. Pt alert and following commands.  3/18: remains off sedation but not consistently awake yesterday to extubate. Otherwise tolerated cpap all day however.  However this am, awakens easily and is doing well. Following commands, tongue thrust and sustained head lift. Will extubate.   3/17: sz yesterday placed on keppra. RN reports wakes up and weakly follows commands but unable to illicit any response on my exam.  3/16: She remains intubated and mechanically ventilated this morning.  She is on no sedation at the time of my exam. Objective   Blood pressure (!) 133/54, pulse 65, temperature 99.2 F (37.3 C), resp. rate 20, height 5\' 3"  (1.6 m), weight 64.6 kg, SpO2 97 %.    FiO2 (%):  [28 %] 28 %   Intake/Output Summary (Last 24 hours) at 02/06/2020 1142 Last data filed at 02/06/2020 1000 Gross per 24 hour  Intake 1332.89 ml  Output  1900 ml  Net -567.11 ml   Filed Weights   02/04/20 0500 02/05/20 0633 02/06/20 0500  Weight: 66.8 kg 65.8 kg 64.6 kg    Examination: General: Chronically ill appearing older adult F, reclined in bed on trach collar NAD Neuro: Awake, drowsy. Following commands with consistent 3-4 second delay. RUE 3/5 LUE 4/5  HEENT: NCAT Pink mmm Tracheostomy secure, trachea  midline. Scant blood tinged tracheal secretions. Anicteric sclera Pulm: Symmetrical chest expansion. No accessory muscle use on trach collar.   Cardiovascular: RRR pacemaker on backup, midline sternotomy c/d/i Abdomen:  Soft round ndnt. + bowel sounds   Extremities: Symmetrical bulk and tone. No obvious joint deformity. No cyanosis or clubbing  GU: Yellow clear urine   Resolved Hospital Problem list     Assessment & Plan:   Acute L MCA CVA: Status post thrombectomy  -eeg with focus noted and started on keppra -decadron x4 doses P -restarting Keppra (unclear when this fell off, last neuro note rec to continue)  -neuro has signed off, follow up with neuro OP    Acute Pulmonary emboli:  -As noted this is distinctly unusual in a post bypass patient.   -Hep ab negative, anti jo, lupus, negative P -heparin gtt  Acute hypoxic Respiratory failure   -s/p trach 3/19 P -tolerating trach trials -Routine trach care    Bilateral pleural effusion -cxr stable ->300 out of chest tube 3/18; 60cc 3/19. P -asymptomatic at this time, trend CXR and consider lasix   Combined HF:  TEE with PFO -LVEF 30-35% with grade 1 diastolic dysfunction  S/p CABG  -per CVTS  HTN:  -cont home lisinopril,  bb  Acute L LE DVT:  -heparin gtt   Hypothyroidism:  -levothyroxine  Best practice:  Diet: EN  Pain/Anxiety/Delirium protocol (if indicated): PRN pxy, fent, ultram, APAP VAP protocol (if indicated): per protocol DVT prophylaxis: heparin gtt  GI prophylaxis: Protonix  Glucose control: monitoring Mobility: Bedrest, PT  code Status: Full Family Communication: pending 3/22  Disposition: ICU; CIR when ready  Labs   CBC: Recent Labs  Lab 01/31/20 0550 01/31/20 0559 02/02/20 0411 02/02/20 0411 02/02/20 1735 02/03/20 0241 02/04/20 0413 02/05/20 0422 02/06/20 0409  WBC 11.4*   < > 12.9*  --   --  6.9 8.9 10.6* 11.4*  NEUTROABS 9.2*  --   --   --   --   --   --   --   --   HGB 9.6*    < > 9.8*   < > 9.5* 9.0* 8.7* 8.9* 8.7*  HCT 29.2*   < > 30.3*   < > 28.0* 28.1* 27.3* 27.8* 27.6*  MCV 91.5   < > 91.8  --   --  93.7 93.8 93.9 93.6  PLT 182   < > 213  --   --  194 229 226 252   < > = values in this interval not displayed.    Basic Metabolic Panel: Recent Labs  Lab 02/02/20 0411 02/02/20 0411 02/02/20 1703 02/02/20 1735 02/03/20 0241 02/04/20 0413 02/05/20 0422 02/06/20 0409  NA 144   < >  --  143 142 142 142 138  K 3.4*   < >  --  3.9 4.5 3.8 3.7 4.1  CL 111  --   --   --  109 107 106 104  CO2 25  --   --   --  24 26 26 27   GLUCOSE 136*  --   --   --  177* 131* 124* 126*  BUN 15  --   --   --  21 32* 29* 21  CREATININE 0.65  --   --   --  0.69 0.85 0.70 0.61  CALCIUM 9.5  --   --   --  9.9 9.9 9.4 9.4  MG 1.7   < > 1.7  --  1.7 2.1 1.7 1.8  PHOS 2.0*  --  2.7  --  3.1  --  2.7 2.7   < > = values in this interval not displayed.   GFR: Estimated Creatinine Clearance: 54.1 mL/min (by C-G formula based on SCr of 0.61 mg/dL). Recent Labs  Lab 02/03/20 0241 02/04/20 0413 02/05/20 0422 02/06/20 0409  WBC 6.9 8.9 10.6* 11.4*    Liver Function Tests: No results for input(s): AST, ALT, ALKPHOS, BILITOT, PROT, ALBUMIN in the last 168 hours. No results for input(s): LIPASE, AMYLASE in the last 168 hours. No results for input(s): AMMONIA in the last 168 hours.  ABG    Component Value Date/Time   PHART 7.427 02/02/2020 1735   PCO2ART 43.3 02/02/2020 1735   PO2ART 301.0 (H) 02/02/2020 1735   HCO3 28.5 (H) 02/02/2020 1735   TCO2 30 02/02/2020 1735   ACIDBASEDEF 2.0 01/28/2020 1545   O2SAT 100.0 02/02/2020 1735     Coagulation Profile: No results for input(s): INR, PROTIME in the last 168 hours.  Cardiac Enzymes: No results for input(s): CKTOTAL, CKMB, CKMBINDEX, TROPONINI in the last 168 hours.  HbA1C: Hgb A1c MFr Bld  Date/Time Value Ref Range Status  01/27/2020 05:24 AM 5.1 4.8 - 5.6 % Final    Comment:    (NOTE) Pre diabetes:           5.7%-6.4% Diabetes:              >6.4% Glycemic control for   <7.0% adults with diabetes     CBG: Recent Labs  Lab 02/05/20 1955 02/06/20 0009 02/06/20 0408 02/06/20 0816 02/06/20 1107  GLUCAP 133* 113* 125* 126* 126*   Critical Care Time: 30 minutes  Eliseo Gum MSN, AGACNP-BC Trainer [phone] If no answer, [phone] 02/06/2020, 11:42 AM

## 2020-02-06 NOTE — Plan of Care (Signed)
  Problem: Clinical Measurements: Goal: Ability to maintain clinical measurements within normal limits will improve Outcome: Progressing Goal: Diagnostic test results will improve Outcome: Progressing Goal: Respiratory complications will improve Outcome: Progressing Goal: Cardiovascular complication will be avoided Outcome: Progressing   Problem: Nutrition: Goal: Adequate nutrition will be maintained Outcome: Progressing   Problem: Coping: Goal: Level of anxiety will decrease Outcome: Progressing   Problem: Elimination: Goal: Will not experience complications related to urinary retention Outcome: Progressing   Problem: Pain Managment: Goal: General experience of comfort will improve Outcome: Progressing

## 2020-02-06 NOTE — Progress Notes (Signed)
ANTICOAGULATION CONSULT NOTE - [place] for Heparin to enoxaparin Indication: pulmonary embolus, stroke and DVT, and recent CABG  Allergies  Allergen Reactions  . Bee Venom Swelling    Massive swelling  . Penicillins Other (See Comments)    UNSPECIFIED REACTION  Did it involve swelling of the face/tongue/throat, SOB, or low BP? Unknown Did it involve sudden or severe rash/hives, skin peeling, or any reaction on the inside of your mouth or nose? Unknown Did you need to seek medical attention at a hospital or doctor's office? Unknown When did it last happen?1945 If all above answers are "NO", may proceed with cephalosporin use.  . Adhesive [Tape] Rash  . Morphine And Related Rash  . Sulfa Antibiotics Nausea And Vomiting and Rash    Patient Measurements: Height: 5\' 3"  (160 cm) Weight: 142 lb 6.7 oz (64.6 kg) IBW/kg (Calculated) : 52.4 Heparin Dosing Weight: 65.3 kg  Vital Signs: Temp: 98.3 F (36.8 C) (03/22 1501) Temp Source: Oral (03/22 0400) BP: 111/55 (03/22 1300) Pulse Rate: 86 (03/22 1300)  Labs: Recent Labs    02/04/20 0413 02/04/20 0413 02/05/20 0422 02/06/20 0409  HGB 8.7*   < > 8.9* 8.7*  HCT 27.3*  --  27.8* 27.6*  PLT 229  --  226 252  HEPARINUNFRC 0.40  --  0.47 0.29*  CREATININE 0.85  --  0.70 0.61   < > = values in this interval not displayed.    Estimated Creatinine Clearance: 54.1 mL/min (by C-G formula based on SCr of 0.61 mg/dL).  Medications:  Plavix is being held as changing to full anticoagulation with IV Heparin  Assessment: 79 year old female admitted with angina on 3/11 and s/p CABG on 3/12 who had acute stroke symptoms on 3/15 AM. CTA of Head and Neck showed an acute MCA infarct and bilateral subsegmental acute pulmonary emboli. She is now s/p IR guided mechanical thrombectomy and Pharmacy has been consulted to start IV Heparin therapy. Critical care is concerned for acquired thrombophilia and has sent a  HIT screen, lupus anticoagulant screen, and a DIC panel. 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   HIT antibody negative, lupus anticoagulant screen negative, anti-Jo antibody neg, extractable nuclear antigen neg.  Pharmacy now asked to transition heparin to enoxaparin.  Goal of Therapy:  Heparin level 0.3 to 0.5 units/ml Monitor platelets by anticoagulation protocol: Yes   Plan:  -Stop heparin -In 1hr, begin enoxaparin 1mg /kg SQ BID   Arrie Senate, PharmD, BCPS Clinical Pharmacist [phone] Please check AMION for all Loop numbers 02/06/2020

## 2020-02-06 NOTE — Progress Notes (Signed)
ANTICOAGULATION CONSULT NOTE - [place] for Heparin Indication: pulmonary embolus, stroke and DVT, and recent CABG  Allergies  Allergen Reactions  . Bee Venom Swelling    Massive swelling  . Penicillins Other (See Comments)    UNSPECIFIED REACTION  Did it involve swelling of the face/tongue/throat, SOB, or low BP? Unknown Did it involve sudden or severe rash/hives, skin peeling, or any reaction on the inside of your mouth or nose? Unknown Did you need to seek medical attention at a hospital or doctor's office? Unknown When did it last happen?1945 If all above answers are "NO", may proceed with cephalosporin use.  . Adhesive [Tape] Rash  . Morphine And Related Rash  . Sulfa Antibiotics Nausea And Vomiting and Rash    Patient Measurements: Height: 5\' 3"  (160 cm) Weight: 142 lb 6.7 oz (64.6 kg) IBW/kg (Calculated) : 52.4 Heparin Dosing Weight: 65.3 kg  Vital Signs: Temp: 98.1 F (36.7 C) (03/22 0000) Temp Source: Oral (03/22 0000) BP: 137/53 (03/22 0321) Pulse Rate: 80 (03/22 0321)  Labs: Recent Labs    02/04/20 0413 02/04/20 0413 02/05/20 0422 02/06/20 0409  HGB 8.7*   < > 8.9* 8.7*  HCT 27.3*  --  27.8* 27.6*  PLT 229  --  226 252  HEPARINUNFRC 0.40  --  0.47 0.29*  CREATININE 0.85  --  0.70 0.61   < > = values in this interval not displayed.    Estimated Creatinine Clearance: 54.1 mL/min (by C-G formula based on SCr of 0.61 mg/dL).  Medications:  Plavix is being held as changing to full anticoagulation with IV Heparin  Assessment: 79 year old female admitted with angina on 3/11 and s/p CABG on 3/12 who had acute stroke symptoms on 3/15 AM. CTA of Head and Neck showed an acute MCA infarct and bilateral subsegmental acute pulmonary emboli. She is now s/p IR guided mechanical thrombectomy and Pharmacy has been consulted to start IV Heparin therapy. Critical care is concerned for acquired thrombophilia and has sent a HIT screen,  lupus anticoagulant screen, and a DIC panel. 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   HIT antibody negative, lupus anticoagulant screen negative, anti-Jo antibody neg, extractable nuclear antigen neg  She is now s/p trach, heparin restarted on 3/19. Heparin level 0.29 this morning is subtherapeutic on heparin 850 units/hr. Hgb 8.7. Plt 252. Per RN, no bleeding reported or issues with IV infusion/access.   Goal of Therapy:  Heparin level 0.3 to 0.5 units/ml Monitor platelets by anticoagulation protocol: Yes   Plan:  - Increase heparin to 900 units/hr  - Check heparin level at 1530 - Monitor heparin level, CBC, and S/S of bleeding daily   Cristela Felt, PharmD PGY1 Pharmacy Resident [employer]: [phone]  02/06/2020       7:02 AM  Please check [email or web address] for unit-specific pharmacist phone numbers

## 2020-02-07 ENCOUNTER — Inpatient Hospital Stay (HOSPITAL_COMMUNITY): Payer: PPO

## 2020-02-07 LAB — GLUCOSE, CAPILLARY
Glucose-Capillary: 105 mg/dL — ABNORMAL HIGH (ref 70–99)
Glucose-Capillary: 113 mg/dL — ABNORMAL HIGH (ref 70–99)
Glucose-Capillary: 122 mg/dL — ABNORMAL HIGH (ref 70–99)
Glucose-Capillary: 126 mg/dL — ABNORMAL HIGH (ref 70–99)
Glucose-Capillary: 129 mg/dL — ABNORMAL HIGH (ref 70–99)
Glucose-Capillary: 129 mg/dL — ABNORMAL HIGH (ref 70–99)

## 2020-02-07 MED ORDER — POTASSIUM CHLORIDE 20 MEQ/15ML (10%) PO SOLN
40.0000 meq | Freq: Once | ORAL | Status: AC
  Administered 2020-02-07: 40 meq
  Filled 2020-02-07: qty 30

## 2020-02-07 MED ORDER — FUROSEMIDE 10 MG/ML IJ SOLN
40.0000 mg | Freq: Once | INTRAMUSCULAR | Status: AC
  Administered 2020-02-07: 40 mg via INTRAVENOUS
  Filled 2020-02-07: qty 4

## 2020-02-07 NOTE — Progress Notes (Signed)
Physical Therapy Treatment Patient Details Name: [name] [name] MRN: [id number] DOB: Apr 12, 1941 Today's Date: 02/07/2020    History of Present Illness Pt is a 79 y.o. female admitted 01/26/20 after cardiac cath showed severe vessel disease; s/p CABG x4 on 3/13. 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 Pt with bilateral PEs. Taken to IR and found to have L M1/MCA occlusion, s/p emergent mechanical thrombectomy. ETT 3/12-3/13 (for procedure), 3/15-3/19. S/p trach 3/19. PMH includes CAD, HTN, THA, osteoporosis.    PT Comments    Pt continues to make steady progress. Continue to recommend CIR.    Follow Up Recommendations  CIR;Supervision/Assistance - 24 hour     Equipment Recommendations  (TBD)    Recommendations for Other Services       Precautions / Restrictions Precautions Precautions: Sternal;Fall;Other (comment) Precaution Booklet Issued: No Precaution Comments: R-side weakness    Mobility  Bed Mobility Overal bed mobility: Needs Assistance Bed Mobility: Supine to Sit     Supine to sit: Mod assist;HOB elevated;+2 for safety/equipment     General bed mobility comments: Assist to bring legs off of bed, elevate trunk into sitting and bring hips to EOB.   Transfers Overall transfer level: Needs assistance Equipment used: Rolling walker (2 wheeled) Transfers: Sit to/from Stand Sit to Stand: +2 physical assistance;Mod assist         General transfer comment: Assist to bring hips up and for balance. Verbal/tactile cues for hand placement to follow sternal precautions. Assist to place rt hand on walker  Ambulation/Gait Ambulation/Gait assistance: Mod assist;Min assist;+2 safety/equipment Gait Distance (Feet): 20 Feet(10' x 1, 20' x 1) Assistive device: Rolling walker (2 wheeled) Gait Pattern/deviations: Decreased step length - right;Decreased step length - left;Shuffle;Trunk  flexed;Step-through pattern Gait velocity: decr Gait velocity interpretation: <1.31 ft/sec, indicative of household ambulator General Gait Details: Assist for balance and support.    Stairs             Wheelchair Mobility    Modified Rankin (Stroke Patients Only) Modified Rankin (Stroke Patients Only) Pre-Morbid Rankin Score: No symptoms Modified Rankin: Moderately severe disability     Balance Overall balance assessment: Needs assistance Sitting-balance support: Single extremity supported;Feet supported Sitting balance-Leahy Scale: Poor Sitting balance - Comments: UE support and min guard to min assist for static sitting   Standing balance support: Bilateral upper extremity supported Standing balance-Leahy Scale: Poor Standing balance comment: walker and min assist for static standing                            Cognition Arousal/Alertness: Awake/alert Behavior During Therapy: WFL for tasks assessed/performed Overall Cognitive Status: Difficult to assess Area of Impairment: Following commands;Safety/judgement;Problem solving                       Following Commands: Follows one step commands consistently;Follows one step commands with increased time Safety/Judgement: Decreased awareness of safety;Decreased awareness of deficits   Problem Solving: Slow processing;Decreased initiation;Requires verbal cues;Requires tactile cues        Exercises      General Comments General comments (skin integrity, edema, etc.): At rest pt on trach collar at 28%. Amb on RA with SpO2 >88%      Pertinent Vitals/Pain Pain Assessment: Faces Faces Pain Scale: Hurts a little bit Pain Location: generalized Pain Descriptors / Indicators: Grimacing Pain Intervention(s): Limited activity within patient's tolerance;Monitored  during session    Home Living                      Prior Function            PT Goals (current goals can now be found in the  care plan section) Acute Rehab PT Goals Patient Stated Goal: Pt unable to state - agreeable to participate Progress towards PT goals: Progressing toward goals    Frequency    Min 4X/week      PT Plan Current plan remains appropriate    Co-evaluation              AM-PAC PT "6 Clicks" Mobility   Outcome Measure  Help needed turning from your back to your side while in a flat bed without using bedrails?: A Lot Help needed moving from lying on your back to sitting on the side of a flat bed without using bedrails?: A Lot Help needed moving to and from a bed to a chair (including a wheelchair)?: A Lot Help needed standing up from a chair using your arms (e.g., wheelchair or bedside chair)?: A Lot Help needed to walk in hospital room?: A Lot Help needed climbing 3-5 steps with a railing? : Total 6 Click Score: 11    End of Session Equipment Utilized During Treatment: Gait belt Activity Tolerance: Patient tolerated treatment well Patient left: in chair;with nursing/sitter in room(nurse techs bathing patient) Nurse Communication: Mobility status PT Visit Diagnosis: Other abnormalities of gait and mobility (R26.89);Hemiplegia and hemiparesis Hemiplegia - Right/Left: Right Hemiplegia - dominant/non-dominant: Dominant Hemiplegia - caused by: Cerebral infarction     Time: [id number] PT Time Calculation (min) (ACUTE ONLY): 27 min  Charges:  $Therapeutic Activity: 23-37 mins                     [place] Pager [phone] Office [place] 02/07/2020, 1:09 PM

## 2020-02-07 NOTE — Progress Notes (Signed)
  Speech Language Pathology Treatment: [name] [name] Speaking valve  Patient Details Name: [name] [name] MRN: [id number] DOB: 07-21-1941 Today's Date: 02/07/2020 Time: [id number] SLP Time Calculation (min) (ACUTE ONLY): 8 min  Assessment / Plan / Recommendation Clinical Impression  Pt continues to have significant difficulty tolerating the PMV.  Cuff deflated (3 ml), valve placed for intervals of 2-3 breath cycles.  Pt demonstrated improved effort today and followed commands for deep inhalation followed by counting or single vowel sounds, but she was unable to produce voice, and removal of valve again resulted in rush of air through trach.  She continues to have thick, tan secretions.  Coughing and superficial suctioning at hub of trach did not improve access to upper airway.  Until trach can be downsized or secretions improve, doubt there will be much improvement with oral speech/voice.  Will continue efforts. D/W RN.    HPI HPI:  79 y.o. female admitted 01/26/20 after cardiac cath showed severe vessel disease; s/p CABG x4 on 3/13. 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 Pt with bilateral PEs. Taken to IR and found to have L M1/MCA occlusion, s/p emergent mechanical thrombectomy. ETT 3/12-3/13 (for procedure), 3/15-3/19. S/p trach 3/19. PMH includes CAD, HTN, THA, osteoporosis.      SLP Plan  Continue with current plan of care       Recommendations         Patient may use Passy-Muir Speech Valve: with SLP only         Oral Care Recommendations: Oral care QID Follow up Recommendations: Inpatient Rehab SLP Visit Diagnosis: Aphonia (R49.1) Plan: Continue with current plan of care       GO                Assunta Curtis 02/07/2020, 3:31 PM  Cailean Heacock L. Tivis Ringer, [place] Office number [phone] Pager [phone]

## 2020-02-07 NOTE — Evaluation (Signed)
Clinical/Bedside Swallow Evaluation Patient Details  Name: SANNIE SEVICK MRN: [id number] Date of Birth: [birth year]  Today's Date: 02/07/2020 Time: SLP Start Time (ACUTE ONLY): 1509 SLP Stop Time (ACUTE ONLY): 1509 SLP Time Calculation (min) (ACUTE ONLY): 8 min  Past Medical History:  Past Medical History:  Diagnosis Date  . Coronary artery disease   . Family history of adverse reaction to anesthesia    " My Sister did "  . HTN (hypertension)   . Hyperlipidemia   . Hypothyroidism   . Osteoporosis   . 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 ABDOMINAL HYSTERECTOMY    . ANKLE SURGERY    . APPENDECTOMY    . CARDIAC CATHETERIZATION  01/25/2010  . CHOLECYSTECTOMY    . CORONARY ARTERY BYPASS GRAFT N/A 01/27/2020   Procedure: CORONARY ARTERY BYPASS GRAFTING (CABG) x 4 using LIMA to LAD; RIMA to OM1; Left radia sequential PDA and Left PL.;  Surgeon: [name], MD;  Location: [facility] OR;  Service: Open Heart Surgery;  Laterality: N/A;  BILATERAL IMA  . gamma knife    . IR CT HEAD LTD  01/30/2020  . IR PERCUTANEOUS ART THROMBECTOMY/INFUSION INTRACRANIAL INC DIAG ANGIO  01/30/2020  . LEFT HEART CATH AND CORONARY ANGIOGRAPHY N/A 01/26/2020   Procedure: LEFT HEART CATH AND CORONARY ANGIOGRAPHY;  Surgeon: Belva Crome, MD;  Location: [place] [facility];  Service: Cardiovascular;  Laterality: N/A;  . RADIAL ARTERY HARVEST Left 01/27/2020   Procedure: RADIAL ARTERY HARVEST (OPEN HARVEST);  Surgeon: [name], MD;  Location: [place];  Service: Open Heart Surgery;  Laterality: Left;  . RADIOLOGY WITH ANESTHESIA N/A 01/30/2020   Procedure: IR WITH ANESTHESIA;  Surgeon: Radiologist, Medication, MD;  Location: [place];  Service: Radiology;  Laterality: N/A;  . TEE WITHOUT CARDIOVERSION N/A 01/27/2020   Procedure: TRANSESOPHAGEAL ECHOCARDIOGRAM (TEE);  Surgeon: [name], MD;  Location: [place];  Service: Open Heart Surgery;   Laterality: N/A;  . THYROIDECTOMY    . TOTAL HIP ARTHROPLASTY     HPI:   79 y.o. female admitted 01/26/20 after cardiac cath showed severe vessel disease; s/p CABG x4 on 3/13. 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 Pt with bilateral PEs. Taken to IR and found to have L M1/MCA occlusion, s/p emergent mechanical thrombectomy. ETT 3/12-3/13 (for procedure), 3/15-3/19. S/p trach 3/19. PMH includes CAD, HTN, THA, osteoporosis.   Assessment / Plan / Recommendation Clinical Impression  Pt participated in limited clinical swallow assessment- PMV was not in place given inability to tolerate.  Pt did demonstrate functional oral motor movements with only mild right lower facial asymmetry and symmetric tongue extension.  When provided with ice chips, she actively masticated material with good oral seal and the appearance of a brisk swallow response.  Pt grimaced with each swallow, nodding head when asked if swallowing was painful.  There was coughing present throughout the assessment; pt was coughing prior to any POs administered.  Given presence of trach (higher rate of silent aspiration in pts with trach) and neurological dx, pt will need an instrumental swallow study prior to beginning a PO diet.  SLP will follow for readiness; D/W RN.  Continue NPO. SLP Visit Diagnosis: Dysphagia, unspecified (R13.10)    Aspiration Risk       Diet Recommendation   NPO  Other  Recommendations Oral Care Recommendations: Oral care QID   Follow up Recommendations Inpatient Rehab      Frequency and Duration min 3x week  2 weeks       Prognosis Prognosis for Safe Diet Advancement: Good      Swallow Study   General HPI:  79 y.o. female admitted 01/26/20 after cardiac cath showed severe vessel disease; s/p CABG x4 on 3/13. On 3/15, pt walking with nursing and developed R-side weakness and decreased responsiveness; head CT  with acute L MCA infarct. Pt with bilateral PEs. Taken to IR and found to have L M1/MCA occlusion, s/p emergent mechanical thrombectomy. ETT 3/12-3/13 (for procedure), 3/15-3/19. S/p trach 3/19. PMH includes CAD, HTN, THA, osteoporosis. Type of Study: Bedside Swallow Evaluation Previous Swallow Assessment: no Diet Prior to this Study: NPO;NG Tube Temperature Spikes Noted: No Respiratory Status: Trach;Trach Collar Trach Size and Type: #6;Deflated;With PMSV not in place History of Recent Intubation: Yes Length of Intubations (days): 7 days Date extubated: 02/03/20 Behavior/Cognition: Alert;Cooperative Oral Cavity Assessment: Other (comment)(appearance of thrush on tongue) Oral Care Completed by SLP: Recent completion by staff Oral Cavity - Dentition: Adequate natural dentition Self-Feeding Abilities: Needs assist Patient Positioning: Upright in bed Baseline Vocal Quality: Not observed Volitional Cough: Wet Volitional Swallow: Able to elicit    Oral/Motor/Sensory Function Overall Oral Motor/Sensory Function: Mild impairment Facial ROM: Reduced right;Suspected CN VII (facial) dysfunction Facial Symmetry: Abnormal symmetry right;Suspected CN VII (facial) dysfunction Lingual Symmetry: Within Functional Limits   Ice Chips Ice chips: Impaired Presentation: Spoon Pharyngeal Phase Impairments: Suspected delayed Swallow;Cough - Delayed   Thin Liquid Thin Liquid: Not tested    Nectar Thick Nectar Thick Liquid: Not tested   Honey Thick Honey Thick Liquid: Not tested   Puree Puree: Not tested   Solid     Solid: Not tested      [name] Laurice 02/07/2020,4:00 PM   Estill Bamberg L. Tivis Ringer, [place] Office number [phone] Pager [phone]

## 2020-02-07 NOTE — Progress Notes (Addendum)
NAME:  [name] [name], MRN:  [id number], DOB:  Jul 27, 1941, LOS: 12 ADMISSION DATE:  01/26/2020, CONSULTATION DATE:  01/30/2020 REFERRING MD:  Rory Percy CHIEF COMPLAINT:  Right hemiplegia   Brief History        This 79 year old was admitted on 3/8 for evaluation of chest pain.  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  She was up ambulating  and suddenly developed a right hemiplegia.  CTA showed an M1 occlusion and thrombectomy was performed.  An attempt was made to extubate the patient post procedure but this was not tolerated.  Of note at the time the CTA was performed she was also found to have pulmonary emboli.  History of present illness       History is as noted above with the following additions.  Intraoperative TEE showed essentially normal LV systolic dysfunction there was some LVH and a PFO with a left to right flow.  There is no thrombus noted in the left atrial appendage.  Past Medical History  Thyroid cancer, hypertension, hyperlipidemia.  Significant Hospital Events   Cardiac cath 3/12, CABG 3/13 3/15: Acute stroke with right-sided hemiplegia, also found to have acute pulmonary emboli 3/16 had seizure placed on Keppra 3/18 extubated, had upper airway edema, reintubated, noted to have significant airway swelling 3/19 tracheostomy placed at bedside 320 through 3/23: Tolerating aerosol trach collar, tolerating 28% is having bloody tracheal secretions Consults:  Thoracic surgery, neurology, interventional radiology.  Procedures:  Four-vessel CABG, left M1 thrombectomy 3/19 trach  Significant Diagnostic Tests:  3/15 LE venous doppler:  RIGHT:  - There is no evidence of deep vein thrombosis in the lower extremity.    - No cystic structure found in the popliteal fossa.    LEFT:  - Findings consistent with acute deep vein thrombosis involving the left  posterior tibial veins.  - No cystic structure found in  the popliteal fossa.  3/15 echo: LVEF [id number], grade 1 diastolic dysfunction, LA dilation, RVSP mildly elevated 3/16 cth: No acute intracranial hemorrhage. Likely evolving recent infarction of the lentiform nucleus. 3/15 cta head/neck 1. Emergent large vessel occlusion at the left M1 segment. 2. Aspects of 10 with CT perfusion suggesting core infarct of 74 cc and 85 cc of penumbra. 3. Flow reducing left ICA bulb stenosis. 4. Bilateral subsegmental acute pulmonary emboli. 5. Proximal occlusion of the non dominant left vertebral artery with reconstitution at the dura. 3/16 eeg: This study showed evidence of epileptogenicity arising from left frontotemporal as well as cortical dysfunction in left hemisphere likely secondary to underlying stroke. No seizures were seen throughout the recording.  Micro Data:  3/8 sars2: neg  Antimicrobials:  None   Interim history/subjective:  Temperature max 99.4 at 11 PM last night, nursing reports more bloody tracheal secretions   Objective   Blood pressure (Abnormal) 118/47, pulse 86, temperature 97.6 F (36.4 C), resp. rate 18, height 5\' 3"  (1.6 m), weight 64.6 kg, SpO2 95 %.    FiO2 (%):  [28 %] 28 %   Intake/Output Summary (Last 24 hours) at 02/07/2020 0845 Last data filed at 02/07/2020 0000 Gross per 24 hour  Intake 531.07 ml  Output 955 ml  Net -423.93 ml   Filed Weights   02/04/20 0500 02/05/20 0633 02/06/20 0500  Weight: 66.8 kg 65.8 kg 64.6 kg    Examination:  General 79 year old white female resting comfortably in bed she is in no acute distress this morning HEENT  normocephalic atraumatic size 6 cuffed tracheostomy in place bloody tracheal secretions being suctioned from tracheostomy Pulmonary: Scattered rhonchi right greater than left no accessory use currently on 28% aerosol trach collar Cardiac: Regular rate and rhythm without murmur rub or gallop Abdomen soft, not tender, no organomegaly tolerating tube feeds Extremities are  warm and dry with brisk capillary refill Neuro she is awake, interactive, moves all extremities, continues to demonstrate right-sided weakness but able to move spontaneously and follow commands with the right side  Resolved Hospital Problem list     Assessment & Plan:   Acute L MCA CVA: Status post thrombectomy complicated by seizure -eeg with focus noted and started on keppra -decadron x4 doses Plan Continuing Keppra Needs neuro follow-up as outpatient Will need inpatient rehab   Acute Pulmonary emboli:  -As noted this is distinctly unusual in a post bypass patient.   -Hep ab negative, anti jo, lupus, negative Plan Continue low molecular weight heparin Eventually transition to [place] after decision for PICC made by primary service  Acute hypoxic Respiratory failure now tracheostomy dependent following failed extubation due to upper airway obstruction from vocal cord edema -s/p trach 3/19 Review of SLP note from 3/23 shows still not able to phonate Plan Continue routine trach care Continue pulse oximetry with titration of oxygen Anticipate change to cuffless trach next 48 hours or so, and ideally downsized to 4 in the week to come, this may help her with speech therapy, will hold off to cuffless today given bloody tracheal secretions and increased need for suction  Bilateral pleural effusion and atelectasis Portable chest x-ray evaluated right greater than left basilar airspace disease.  No significant effusion on bedside ultrasound from 3/22 Plan Continue IV diuresis as BUN/creatinine and blood pressure allow Optimize pulmonary hygiene with mobilization Aspiration precautions  Status post coronary artery bypass grafting on 3/13 combined HF, htn:  TEE with PFO -LVEF 30-35% with grade 1 diastolic dysfunction Plan Continue lisinopril and beta-blockade Daily assessment for diuretics  Acute L LE DVT:  Plan Low molecular weight heparin  Hypothyroidism:   Plan Levothyroxine  Best practice:  Diet: EN  Pain/Anxiety/Delirium protocol (if indicated): PRN pxy, fent, ultram, APAP VAP protocol (if indicated): per protocol DVT prophylaxis: heparin gtt  GI prophylaxis: Protonix  Glucose control: monitoring Mobility: Bedrest, PT  code Status: Full Family Communication: pending 3/22  Disposition: ICU; CIR when ready, I suspect the next 24 to 48 hours  Erick Colace ACNP-BC [place] Pager # [phone] OR # [phone] if no answer

## 2020-02-07 NOTE — Progress Notes (Signed)
Patient ID: [name] [name], female   DOB: July 14, 1941, 79 y.o.   MRN: [id number] [facility] Evening Rounds:  Hemodynamically stable in sinus rhythm.  sats 94% on TC.  Evaluated by ST today and recommended keeping NPO for now. Continue tube feeds.  Ambulated 20 ft  with PT.

## 2020-02-08 DIAGNOSIS — I639 Cerebral infarction, unspecified: Secondary | ICD-10-CM

## 2020-02-08 DIAGNOSIS — Z85850 Personal history of malignant neoplasm of thyroid: Secondary | ICD-10-CM

## 2020-02-08 DIAGNOSIS — D62 Acute posthemorrhagic anemia: Secondary | ICD-10-CM

## 2020-02-08 DIAGNOSIS — J90 Pleural effusion, not elsewhere classified: Secondary | ICD-10-CM

## 2020-02-08 DIAGNOSIS — Z930 Tracheostomy status: Secondary | ICD-10-CM

## 2020-02-08 DIAGNOSIS — D72829 Elevated white blood cell count, unspecified: Secondary | ICD-10-CM

## 2020-02-08 DIAGNOSIS — Z951 Presence of aortocoronary bypass graft: Secondary | ICD-10-CM

## 2020-02-08 DIAGNOSIS — Z9889 Other specified postprocedural states: Secondary | ICD-10-CM

## 2020-02-08 DIAGNOSIS — Z8673 Personal history of transient ischemic attack (TIA), and cerebral infarction without residual deficits: Secondary | ICD-10-CM

## 2020-02-08 LAB — GLUCOSE, CAPILLARY
Glucose-Capillary: 111 mg/dL — ABNORMAL HIGH (ref 70–99)
Glucose-Capillary: 121 mg/dL — ABNORMAL HIGH (ref 70–99)
Glucose-Capillary: 125 mg/dL — ABNORMAL HIGH (ref 70–99)
Glucose-Capillary: 127 mg/dL — ABNORMAL HIGH (ref 70–99)
Glucose-Capillary: 128 mg/dL — ABNORMAL HIGH (ref 70–99)
Glucose-Capillary: 138 mg/dL — ABNORMAL HIGH (ref 70–99)

## 2020-02-08 MED ORDER — OSMOLITE 1.2 CAL PO LIQD
1000.0000 mL | ORAL | Status: DC
  Administered 2020-02-08 – 2020-02-16 (×6): 1000 mL
  Filled 2020-02-08 (×21): qty 1000

## 2020-02-08 NOTE — Progress Notes (Signed)
Inpatient Rehabilitation Admissions Coordinator  Please place order for inpt rehab consult if you would like patient considered for possible CIR admit. Please advise.  Danne Baxter, RN, MSN Rehab Admissions Coordinator [phone] 02/08/2020 1:18 PM

## 2020-02-08 NOTE — Progress Notes (Signed)
  Speech Language Pathology Treatment: [name] [name] Speaking valve;Dysphagia  Patient Details Name: [name] [name] MRN: [id number] DOB: 06/03/1941 Today's Date: 02/08/2020 Time: 1352-1430 SLP Time Calculation (min) (ACUTE ONLY): 38 min  Assessment / Plan / Recommendation Clinical Impression  Pt was alert and coopertive throughout the session. Vital were RR 25, SpO2 07, and HR 78 at baseline. She was changed to a cuffless trach and she tolerated PMSV for 57 minutes total including the time of the speech/language evaluation. Vitals range was RR 18-27, SpO2 95-99, and HR 79-82 during this period and pt denied any dyspnea. Vocal quality was intermittently wet but improved with prompted coughing/throat clearing. Vocal intensity was reduced and pt required moderate cues to improve coordination of respiration with speech. In the absence of cueing, pt demonstrated adequate breath support for production of 2-3 word utterances. She tolerated puree solids without overt s/sx of aspiration but exhibited multiple swallows suggesting possible pharyngeal residue. It is recommended that PMSV be used intermittently with staff only if full supervision is provided and SLP will continue to follow pt. An instrumental swallow evaluation will be conducted when pt has demonstrated consistent tolerance of the PMSV.   HPI HPI:  79 y.o. female admitted 01/26/20 after cardiac cath showed severe vessel disease; s/p CABG x4 on 3/13. 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 Pt with bilateral PEs. Taken to IR and found to have L M1/MCA occlusion, s/p emergent mechanical thrombectomy. ETT 3/12-3/13 (for procedure), 3/15-3/19. S/p trach 3/19. PMH includes CAD, HTN, THA, osteoporosis.      SLP Plan  Other (Comment)(SLE)       Recommendations  Diet recommendations: NPO Medication Administration: Via alternative means      Patient may use  Passy-Muir Speech Valve: Intermittently with supervision PMSV Supervision: Full         Oral Care Recommendations: Oral care QID Follow up Recommendations: Inpatient Rehab SLP Visit Diagnosis: Aphonia (R49.1);Dysphagia, unspecified (R13.10);Aphasia (R47.01) Plan: Other (Comment)(SLE)       Jaythan Hinely I. Hardin Negus, [place], [place] Office number [phone] Pager [place] 02/08/2020, 3:11 PM

## 2020-02-08 NOTE — Progress Notes (Signed)
CVP was made with yellow caps and placed at beside.

## 2020-02-08 NOTE — Progress Notes (Signed)
[facility] BRIEF SICU PROGRESS NOTE  9 Days Post-Op  S/P Procedure(s) (LRB): IR WITH ANESTHESIA (N/A)   Stable day  Plan: Continue current plan.  Awaiting bed for transfer  Rexene Alberts, MD 02/08/2020 6:50 PM

## 2020-02-08 NOTE — Progress Notes (Signed)
Tracheostomy tube change: Informed verbal consent was obtained after explaining the risks (including bleeding and infection), benefits and alternatives of the procedure. Verbal timeout was performed prior to the procedure. The old  #6 cuffed trach was carefully removed over a tube exchanger after sutures were removed and verified that cuff was deflated. the tracheostomy site appeared: unremarkable other than old dried blood and was a little more tender to palpation around the stoma. Also has fairly significant foul smelling purulent secretions. . A new #  Cuffless trach was advanced over tube changer thru the tracheostomy stoma and secured with velcro trach ties. The tracheostomy was patent, good color change observed via EZ-CAP, and the patient was easily able to voice with finger occlusion and tolerated the procedure well with no immediate complications.   I did request respiratory culture be sent Pt tolerated well.   Erick Colace ACNP-BC [place] Pager # [phone] OR # [phone] if no answer

## 2020-02-08 NOTE — Consult Note (Signed)
Physical Medicine and Rehabilitation Consult   Reason for Consult:  Referring Physician: Antony Odea, PA-C   HPI: [name] [name] is a 79 y.o. female with history of HTN, CAD, thyroid cancer, progressive cardiac symptoms with chest pressure and was admitted on 01/27/2020 for cardiac cath revealing severe three-vessel CAD.  History taken from chart review due to nonverbal trach and cognition.  She underwent CABG x4 by Dr. Ahmed Prima on the same day.  On 01/20/2020, patient had presyncopal episode with hypoxia, decrease in responsiveness, right-sided weakness with gaze fixed to the left and inability to speak.  CTA/head/neck with perfusion done revealing emergent large vessel occlusion of left M1 segment, left MCA stroke a with penumbra, proximal occlusion eft-VA as well as bilateral subsegmental pulmonary emboli.  She underwent cerebral angio with mechanical thrombectomy and progression of left M1/MCA proximal location with TICI 3 flow.  She was started on IV heparin and follow-up CT head on 3/16, personally reviewed, stable.  Echocardiogram with ejection fraction of 30-35% with global hypokinesis.  Lower extremity Dopplers showed acute DVT in left posterior tibial veins. Neurology felt stroke secondary to paradoxical emboli in setting of PE and PFO (seen on intraoperative TEE) versus secondary to large vessel disease.   She continued to have bouts of lethargy and was noted to have right upper extremity and right lower extremity tremors versus seizure type activity.  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  She was unable to tolerate extubation on 3/18, was reintubated and bedside tracheostomy performed 3/19 by Dr. Lynetta Mare.  She was found to have right pleural effusion however this was not amenable for thoracocentesis.  She was extubated to ATC and is tolerating PMSV trials with ST.  Hospital course further  complicated by dysphagia and patient remains n.p.o. She was downsized to CFS#6 today and therapy ongoing and patient limited by delayed processing with right-sided weakness and sternal precautions affecting mobility and ADLs.Marland Kitchen  CIR recommended due to functional decline.   Review of Systems  Unable to perform ROS: Patient nonverbal  Due to trach   Past Medical History:  Diagnosis Date  . Coronary artery disease   . Family history of adverse reaction to anesthesia    " My Sister did "  . HTN (hypertension)   . Hyperlipidemia   . Hypothyroidism   . Osteoporosis   . Thyroid carcinoma [facility])     Past Surgical History:  Procedure Laterality Date  . ABDOMINAL HYSTERECTOMY    . ANKLE SURGERY    . APPENDECTOMY    . CARDIAC CATHETERIZATION  01/25/2010  . CHOLECYSTECTOMY    . CORONARY ARTERY BYPASS GRAFT N/A 01/27/2020   Procedure: CORONARY ARTERY BYPASS GRAFTING (CABG) x 4 using LIMA to LAD; RIMA to OM1; Left radia sequential PDA and Left PL.;  Surgeon: [name], MD;  Location: [facility] OR;  Service: Open Heart Surgery;  Laterality: N/A;  BILATERAL IMA  . gamma knife    . IR CT HEAD LTD  01/30/2020  . IR PERCUTANEOUS ART THROMBECTOMY/INFUSION INTRACRANIAL INC DIAG ANGIO  01/30/2020  . LEFT HEART CATH AND CORONARY ANGIOGRAPHY N/A 01/26/2020   Procedure: LEFT HEART CATH AND CORONARY ANGIOGRAPHY;  Surgeon: Belva Crome, MD;  Location: [place] [facility];  Service: Cardiovascular;  Laterality: N/A;  . RADIAL ARTERY HARVEST Left 01/27/2020   Procedure: RADIAL ARTERY HARVEST (OPEN HARVEST);  Surgeon: [name], MD;  Location: [place];  Service: Open Heart Surgery;  Laterality: Left;  . RADIOLOGY WITH ANESTHESIA N/A 01/30/2020   Procedure: IR WITH ANESTHESIA;  Surgeon: Radiologist, Medication, MD;  Location: [place];  Service: Radiology;  Laterality: N/A;  . TEE WITHOUT CARDIOVERSION N/A 01/27/2020   Procedure: TRANSESOPHAGEAL ECHOCARDIOGRAM (TEE);  Surgeon: [name], MD;  Location:  [place];  Service: Open Heart Surgery;  Laterality: N/A;  . THYROIDECTOMY    . TOTAL HIP ARTHROPLASTY      Family History  Problem Relation Age of Onset  . Breast cancer Mother 74  . Hypertension Mother   . Diabetes Mother   . Heart disease Mother   . Cirrhosis Father   . Cancer Brother   . Heart disease Brother   . Heart attack Sister     Social History:  Lives alone? She reports that she has never smoked. She has never used smokeless tobacco. Per reports previous alcohol use. Per reports that she does not use drugs.   Allergies  Allergen Reactions  . Bee Venom Swelling    Massive swelling  . Penicillins Other (See Comments)    UNSPECIFIED REACTION  Did it involve swelling of the face/tongue/throat, SOB, or low BP? Unknown Did it involve sudden or severe rash/hives, skin peeling, or any reaction on the inside of your mouth or nose? Unknown Did you need to seek medical attention at a hospital or doctor's office? Unknown When did it last happen?1945 If all above answers are "NO", may proceed with cephalosporin use.  . Adhesive [Tape] Rash  . Morphine And Related Rash  . Sulfa Antibiotics Nausea And Vomiting and Rash    Medications Prior to Admission  Medication Sig Dispense Refill  . acetaminophen (TYLENOL) 500 MG tablet Take 1,000 mg by mouth in the morning and at bedtime.     Marland Kitchen aspirin EC 81 MG tablet Take 81 mg by mouth at bedtime.    Marland Kitchen atorvastatin (LIPITOR) 80 MG tablet Take 80 mg by mouth daily at 12 noon.     . cholecalciferol (VITAMIN D) 1000 UNITS tablet Take 1,000 Units by mouth daily at 12 noon.     . fluticasone (FLONASE) 50 MCG/ACT nasal spray Place 2 sprays into both nostrils daily.    Marland Kitchen levothyroxine (SYNTHROID, LEVOTHROID) 200 MCG tablet Take 100-200 mcg by mouth See admin instructions. Take 200 mcg  SUN-TUES-THUR-SAT Take 100 mcg tab MON-WED-FRI  DO NOT USE GENERIC    . lisinopril (ZESTRIL) 20 MG tablet Take 20 mg by mouth daily.     . metoprolol  tartrate (LOPRESSOR) 25 MG tablet Take 1 tablet (25 mg total) by mouth 2 (two) times daily. (Patient taking differently: Take 25 mg by mouth 2 (two) times daily after a meal. ) 180 tablet 1  . nitroGLYCERIN (NITROSTAT) 0.4 MG SL tablet Place 1 tablet (0.4 mg total) under the tongue every 5 (five) minutes as needed for chest pain. 30 tablet 5  . Prenatal Vit-Fe Fumarate-FA (PRENATAL MULTIVITAMIN) TABS tablet Take 1 tablet by mouth daily at 12 noon.    . vitamin B-12 (CYANOCOBALAMIN) 1000 MCG tablet Take 2,500 mcg by mouth daily.      Home: Home Living Family/patient expects to be discharged to:: Private residence Living Arrangements: Alone Available Help at Discharge: Family, Available PRN/intermittently Type of Home: House Home Access: Stairs to enter [employer] of Steps: 3-4 Entrance Stairs-Rails: Right Home Layout: One level Additional Comments: Limited info due to pt not verbalizing. Nodding/shaking head appropriately  Functional History: Prior Function  Level of Independence: Independent Functional Status:  Mobility: Bed Mobility Overal bed mobility: Needs Assistance Bed Mobility: Supine to Sit Supine to sit: Mod assist, HOB elevated, Max assist, +2 for physical assistance Sit to supine: +2 for safety/equipment, Mod assist General bed mobility comments: Assist to roll left >right due to can reach with left UE and not as much with right.  Assist to bring legs off of bed, elevate trunk into sitting and bring hips to EOB. used pad to assist  Transfers Overall transfer level: Needs assistance Equipment used: Rolling walker (2 wheeled) Transfer via Lift Equipment: Stedy Transfers: Sit to/from Stand Sit to Stand: +2 physical assistance, Mod assist, Min assist, From elevated surface General transfer comment: Assist to bring hips up and for balance. Verbal/tactile cues for hand placement to follow sternal precautions. pt able to place rt hand on  walker Ambulation/Gait Ambulation/Gait assistance: Mod assist, Min assist, +2 safety/equipment Gait Distance (Feet): 50 Feet Assistive device: Rolling walker (2 wheeled) Gait Pattern/deviations: Decreased step length - right, Decreased step length - left, Shuffle, Trunk flexed, Step-through pattern General Gait Details: Assist for balance and support. Assist needed to steer RW.  Also cued for sequencing steps as well.  Pt tends to lean forward as she fatigues therefore also cued for postural stability.   Gait velocity: decr Gait velocity interpretation: <1.31 ft/sec, indicative of household ambulator    ADL: ADL Overall ADL's : Needs assistance/impaired Eating/Feeding: NPO Eating/Feeding Details (indicate cue type and reason): cortrak Grooming: Moderate assistance, Sitting Grooming Details (indicate cue type and reason): LUE used Upper Body Bathing: Moderate assistance, Sitting Lower Body Bathing: Maximal assistance, Sit to/from stand Upper Body Dressing : Moderate assistance, Sitting Lower Body Dressing: Maximal assistance, Sit to/from stand Toilet Transfer: Moderate assistance, +2 for physical assistance, +2 for safety/equipment, Stand-pivot(with stedy) Toilet Transfer Details (indicate cue type and reason): simulated with transfer to recliner Toileting- Clothing Manipulation and Hygiene: Maximal assistance, Sit to/from stand Functional mobility during ADLs: Moderate assistance, +2 for physical assistance, +2 for safety/equipment General ADL Comments: Pt with decreased activity tolerance, decreased strength and decreased ability to care for self.  Cognition: Cognition Overall Cognitive Status: Difficult to assess Orientation Level: Oriented X4 Cognition Arousal/Alertness: Awake/alert Behavior During Therapy: WFL for tasks assessed/performed Overall Cognitive Status: Difficult to assess Area of Impairment: Following commands, Safety/judgement, Problem solving Orientation Level:  Place, Time, Situation Following Commands: Follows one step commands consistently, Follows one step commands with increased time Safety/Judgement: Decreased awareness of safety, Decreased awareness of deficits Problem Solving: Slow processing, Decreased initiation, Requires verbal cues, Requires tactile cues General Comments: Following one step commands, nodding yes/no and giving 'thumbs up' appropriately. Pleasant and agreeable Difficult to assess due to: Tracheostomy   Blood pressure (!) 101/59, pulse 76, temperature 98.3 F (36.8 C), temperature source Oral, resp. rate (!) 33, height 5\' 3"  (1.6 m), weight 65.5 kg, SpO2 99 %. Physical Exam  Nursing note and vitals reviewed. Constitutional: She appears well-developed and well-nourished.  HENT:  + NG Thrush on tongue.   Eyes: EOM are normal. Right eye exhibits no discharge. Left eye exhibits no discharge.  Neck:  + Trach with ATC.   Respiratory: Effort normal.  Bilateral coarse crackles Wet sounding  GI: Soft. She exhibits no distension.  Musculoskeletal:     Comments: Distal right upper extremity edema  Neurological: She is alert.  Wet voice  HOH Appears to have receptive>expressive aphasia  She was able to follow simple one step motor commands with occasional cues  Motor:  LUE: 5/5 proximal distal  RUE: 4/5 proximal distally LLE: 4+/5 proximal distal RLE: 2+/5 proximal to distal  Skin: Skin is warm and dry.  See above  Psychiatric:  Limited due to trach    Results for orders placed or performed during the hospital encounter of 01/26/20 (from the past 24 hour(s))  Glucose, capillary     Status: Abnormal   Collection Time: 02/07/20  3:32 PM  Result Value Ref Range   Glucose-Capillary 122 (H) 70 - 99 mg/dL  Glucose, capillary     Status: Abnormal   Collection Time: 02/07/20  7:47 PM  Result Value Ref Range   Glucose-Capillary 105 (H) 70 - 99 mg/dL  Glucose, capillary     Status: Abnormal   Collection Time: 02/08/20  12:39 AM  Result Value Ref Range   Glucose-Capillary 138 (H) 70 - 99 mg/dL  Glucose, capillary     Status: Abnormal   Collection Time: 02/08/20  4:57 AM  Result Value Ref Range   Glucose-Capillary 128 (H) 70 - 99 mg/dL  Glucose, capillary     Status: Abnormal   Collection Time: 02/08/20  8:06 AM  Result Value Ref Range   Glucose-Capillary 125 (H) 70 - 99 mg/dL  Glucose, capillary     Status: Abnormal   Collection Time: 02/08/20 11:57 AM  Result Value Ref Range   Glucose-Capillary 111 (H) 70 - 99 mg/dL   DG Chest 1 View  Result Date: 02/07/2020 CLINICAL DATA:  Tracheostomy.  Cardiac surgery. EXAM: CHEST  1 VIEW COMPARISON:  02/06/2020. FINDINGS: Tracheostomy tube, feeding tube, right PICC line stable position. Cardiomegaly. Mild bilateral pulmonary infiltrates/edema noted on today's exam. Low lung volumes. Stable small bilateral pleural effusions again noted. No pneumothorax. IMPRESSION: 1.  Lines and tubes stable position. 2.  Stable cardiomegaly. 3. Mild bilateral pulmonary infiltrates/edema noted on today's exam. Low lung volumes. Stable small bilateral pleural effusions again noted. Electronically Signed   By: Marcello Moores  Register   On: 02/07/2020 07:27   IR US CHEST  Result Date: 02/06/2020 CLINICAL DATA:  Pleural effusion.  Status post CABG. EXAM: CHEST ULTRASOUND COMPARISON:  Chest x-ray earlier today. FINDINGS: Ultrasound was performed of the right chest demonstrating only a small amount of pleural fluid and underlying atelectatic/consolidated lung. There was not enough fluid present to allow for safe thoracentesis. IMPRESSION: Small volume right pleural effusion by ultrasound. There was not enough fluid present today to allow for safe thoracentesis. Electronically Signed   By: Aletta Edouard M.D.   On: 02/06/2020 14:19    Assessment/Plan: Diagnosis: Left MCA infarct Stroke: Continue secondary stroke prophylaxis and Risk Factor Modification listed below:   Antiplatelet therapy:    Blood Pressure Management:  Continue current medication with prn's with permisive HTN per primary team Statin Agent:   Right sided hemiparesis: fit for orthosis to prevent contractures (PRAFO) PT/OT for mobility, ADL training  Motor recovery: Fluoxetine Labs and images (see above) independently reviewed.  Records reviewed and summated above.  1. Does the need for close, 24 hr/day medical supervision in concert with the patient's rehab needs make it unreasonable for this patient to be served in a less intensive setting? Yes  2. Co-Morbidities requiring supervision/potential complications: HTN (monitor and provide prns in accordance with increased physical exertion and pain), CAD, thyroid cancer, leukocytosis (repeat labs, cont to monitor for signs and symptoms of infection, further workup if indicated), ABLA (repeat labs, consider transfusion if necessary to ensure appropriate perfusion for increased activity tolerance) 3. Due to bladder management,  bowel management, safety, skin/wound care, disease management, medication administration and patient education, does the patient require 24 hr/day rehab nursing? Yes 4. Does the patient require coordinated care of a physician, rehab nurse, therapy disciplines of PT/OT/SLP to address physical and functional deficits in the context of the above medical diagnosis(es)? Yes Addressing deficits in the following areas: balance, endurance, locomotion, strength, transferring, bowel/bladder control, bathing, dressing, feeding, grooming, toileting, cognition, speech, language, swallowing and psychosocial support 5. Can the patient actively participate in an intensive therapy program of at least 3 hrs of therapy per day at least 5 days per week? Potentially 6. The potential for patient to make measurable gains while on inpatient rehab is excellent 7. Anticipated functional outcomes upon discharge from inpatient rehab are supervision and min assist  with PT,  supervision and min assist with OT, supervision and min assist with SLP. 8. Estimated rehab length of stay to reach the above functional goals is: 17-20 days. 9. Anticipated discharge destination: TBD. 10. Overall Rehab/Functional Prognosis: good  RECOMMENDATIONS: This patient's condition is appropriate for continued rehabilitative care in the following setting: CIR when medically stable with caregiver support available upon discharge. Patient has agreed to participate in recommended program. Potentially Note that insurance prior authorization may be required for reimbursement for recommended care.  Comment: Rehab Admissions Coordinator to follow up.  I have personally performed a face to face diagnostic evaluation, including, but not limited to relevant history and physical exam findings, of this patient and developed relevant assessment and plan.  Additionally, I have reviewed and concur with the physician assistant's documentation above.   Delice Lesch, MD, ABPMR Bary Leriche, PA-C 02/08/2020

## 2020-02-08 NOTE — Progress Notes (Signed)
Pt's trach was changed from #6 shiley cuffed to #6 shiley cuffless by MD. No complications noted. Extra supplies are at bedside.

## 2020-02-08 NOTE — Care Management (Signed)
Benefits check sent to [facility] with Case Management to check on price of medications for Eliquis and Xarelto.

## 2020-02-08 NOTE — Progress Notes (Signed)
Nutrition Follow-up  DOCUMENTATION CODES:   Not applicable  INTERVENTION:   Tube Feeding via Cortrak:  Change to Osmolite 1.2 at 60 ml/hr Provides 1728 kcals, 80 g of protein and 1166 mL of free water Meets 100% of estimated calorie and protein needs  Await diet advancement; further recommendations to follow  NUTRITION DIAGNOSIS:   Inadequate oral intake related to inability to eat as evidenced by NPO status.  Being addressed via TF   GOAL:   Patient will meet greater than or equal to 90% of their needs  Progressing  MONITOR:   Vent status, I & O's  REASON FOR ASSESSMENT:   Ventilator    ASSESSMENT:   Pt with PMH of HTN, HLD, and thyroid cancer admitted 3/11 for chest pain found to have 4-vessel CAD and underwent CABG 3/13.   3/11 Admitted 3/13 CABG 3/15 L MCA infarct with left M1 occlusion s/p thromecbtomy, Intubated 3/16 Seizures, started on Keppra 3/17 Cortrak placed 3/19 Trach placed 3/20 Trach collar  NPO, SLP following. Noted from 3/23 indicating patient not able to phonate, not tolerating PMV, plan for instrumental swallow study prior to po diet  Vital AF 1.2 infusing at 45 ml/hr via Cortrak tube  Current weight 65.5 kg; admission weight 65 kg. Net negative 5 L per I/O flow sheet  Labs: reviewed; CBGs 105-138 Meds: reviewed    Diet Order:   Diet Order            Diet NPO time specified  Diet effective midnight              EDUCATION NEEDS:   No education needs have been identified at this time  Skin:  Skin Assessment: Reviewed RN Assessment  Last BM:  3/24  Height:   Ht Readings from Last 1 Encounters:  01/27/20 5\' 3"  (1.6 m)    Weight:   Wt Readings from Last 1 Encounters:  02/08/20 65.5 kg    Ideal Body Weight:  52.2 kg  BMI:  Body mass index is 25.58 kg/m.  Estimated Nutritional Needs:   Kcal:  1650-1850 kcals  Protein:  80-90g  Fluid:  >/= 1.7 L   Kerman Passey MS, RDN, LDN, CNSC RD Pager Number and  Weekend/On-Call After Hours Pager Located in [place]

## 2020-02-08 NOTE — Progress Notes (Addendum)
NAME:  [name] [name], MRN:  [id number], DOB:  09-01-1941, LOS: 36 ADMISSION DATE:  01/26/2020, CONSULTATION DATE:  01/30/2020 REFERRING MD:  Rory Percy CHIEF COMPLAINT:  Right hemiplegia   Brief History        This 79 year old was admitted on 3/8 for evaluation of chest pain.  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  She was up ambulating  and suddenly developed a right hemiplegia.  CTA showed an M1 occlusion and thrombectomy was performed.  An attempt was made to extubate the patient post procedure but this was not tolerated.  Of note at the time the CTA was performed she was also found to have pulmonary emboli.  History of present illness       History is as noted above with the following additions.  Intraoperative TEE showed essentially normal LV systolic dysfunction there was some LVH and a PFO with a left to right flow.  There is no thrombus noted in the left atrial appendage.  Past Medical History  Thyroid cancer, hypertension, hyperlipidemia.  Significant Hospital Events   Cardiac cath 3/12, CABG 3/13 3/15: Acute stroke with right-sided hemiplegia, also found to have acute pulmonary emboli 3/16 had seizure placed on Keppra 3/18 extubated, had upper airway edema, reintubated, noted to have significant airway swelling 3/19 tracheostomy placed at bedside 320 through 3/23: Tolerating aerosol trach collar, tolerating 28% is having bloody tracheal secretions 3/24: Changed to cuffless trach at bedside moved to stepdown unit Consults:  Thoracic surgery, neurology, interventional radiology.  Procedures:  Four-vessel CABG, left M1 thrombectomy 3/19 trach  Significant Diagnostic Tests:  3/15 LE venous doppler:  RIGHT:  - There is no evidence of deep vein thrombosis in the lower extremity.    - No cystic structure found in the popliteal fossa.    LEFT:  - Findings consistent with acute deep vein thrombosis involving  the left  posterior tibial veins.  - No cystic structure found in the popliteal fossa.  3/15 echo: LVEF [id number], grade 1 diastolic dysfunction, LA dilation, RVSP mildly elevated 3/16 cth: No acute intracranial hemorrhage. Likely evolving recent infarction of the lentiform nucleus. 3/15 cta head/neck 1. Emergent large vessel occlusion at the left M1 segment. 2. Aspects of 10 with CT perfusion suggesting core infarct of 74 cc and 85 cc of penumbra. 3. Flow reducing left ICA bulb stenosis. 4. Bilateral subsegmental acute pulmonary emboli. 5. Proximal occlusion of the non dominant left vertebral artery with reconstitution at the dura. 3/16 eeg: This study showed evidence of epileptogenicity arising from left frontotemporal as well as cortical dysfunction in left hemisphere likely secondary to underlying stroke. No seizures were seen throughout the recording.  Micro Data:  3/8 sars2: neg  Antimicrobials:  None   Interim history/subjective:   Fever spikes feeling well getting stronger  Objective   Blood pressure (Abnormal) 124/49, pulse 71, temperature 98.9 F (37.2 C), temperature source Oral, resp. rate 14, height 5\' 3"  (1.6 m), weight 65.5 kg, SpO2 99 %.    FiO2 (%):  [28 %] 28 %   Intake/Output Summary (Last 24 hours) at 02/08/2020 1010 Last data filed at 02/08/2020 0900 Gross per 24 hour  Intake 175 ml  Output 2125 ml  Net -1950 ml   Filed Weights   02/05/20 [id number] 02/06/20 0500 02/08/20 0200  Weight: 65.8 kg 64.6 kg 65.5 kg    Examination:  General this a pleasant 79 year old white female she is currently sitting  up in the chair and in no acute distress this morning HEENT normocephalic atraumatic she has a size 6 cuffed tracheostomy in place there are old bloody dried secretions but otherwise unremarkable she is unable to phonate with Passy-Muir valve and develops fairly acute distress/difficulty ventilation with tracheostomy occluded with thumb.  Large rush of air notable  from tracheostomy when either PMV or occlusion is removed Pulmonary: Scattered rhonchi no accessory use currently 28% humidified trach collar Cardiac regular rate and rhythm Abdomen is soft nontender no organomegaly Extremities are warm and dry with no edema Neuro awake, hard of hearing, oriented x3, unable to phonate.  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   -Hep ab negative, anti jo, lupus, negative Plan Continuing low molecular weight heparin  Eventually transition to [place], depending on if she requires PEG tube   Acute hypoxic Respiratory failure now tracheostomy dependent following failed extubation due to upper airway obstruction from vocal cord edema -s/p trach 3/19 Review of SLP note from 3/23 shows still not able to phonate Plan We will downsize her to a 6 cuffless today, eventually will get her to size 4 however her tracheal secretions remain a little thick, so we will hold off from this for now Continue routine tracheostomy care Continue pulse oximetry Continue to work aggressively with speech therapy, I am hopeful she will be able to tolerate PMV with cuffless trach however I think there is a mix of both minimal real estate around the current trach as well as ongoing subglottal edema versus scar tissue as evidenced by her intolerance of PMV and also occlusion in general which is much more pronounced   Bilateral pleural effusion and atelectasis Portable chest x-ray evaluated right greater than left basilar airspace disease.  No significant effusion on bedside ultrasound from 3/22 Plan Cont daily assessment Cont trend  chemistries  Am cxr   Status post coronary artery bypass grafting on 3/13 combined HF, htn:  TEE with PFO -LVEF 30-35% with grade 1 diastolic dysfunction Plan Continue lisinopril, beta-blockade, and Daily assessment for diuretic    Acute L LE DVT:  Plan Low molecular weight heparin  Hypothyroidism:  Plan Continue levothyroxine  Best practice:  Diet: EN  Pain/Anxiety/Delirium protocol (if indicated): PRN pxy, fent, ultram, APAP VAP protocol (if indicated): per protocol DVT prophylaxis: LMWH GI prophylaxis: Protonix  Glucose control: monitoring Mobility: Bedrest, PT  code Status: Full Family Communication: pending 3/22  Disposition: We will move her to progressive today, anticipate she will be ready for inpatient rehab in the next 48 hours or so  Erick Colace ACNP-BC [place] Pager # [phone] OR # [phone] if no answer

## 2020-02-08 NOTE — Progress Notes (Signed)
Occupational Therapy Treatment Patient Details Name: [name] [name] MRN: [id number] DOB: [birth year] Today's Date: 02/08/2020    History of present illness Pt is a 79 y.o. female admitted 01/26/20 after cardiac cath showed severe vessel disease; s/p CABG x4 on 3/13. 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 Pt with bilateral PEs. Taken to IR and found to have L M1/MCA occlusion, s/p emergent mechanical thrombectomy. ETT 3/12-3/13 (for procedure), 3/15-3/19. S/p trach 3/19. PMH includes CAD, HTN, THA, osteoporosis.   OT comments  Pt with increased command follow today and progressing with OOB activity. Pt continues to be limited by decreased activity tolerance, decreased strength and decreased ability to care for self. Pt using RUE for grasping RW today without tactile cues to hold onto it. Pt totalA for pericare- pt nodding head that she knew that she had a BM, but did not use call bell.  BP in sitting at EOB 117/57 (74) 90 BPM; Trach collar at 28% at rest.  Walked pt on 30% trach collar. Sats >94%. Pt  Would greatly benefit from continued OT skilled services. OT following acutely.    Follow Up Recommendations  CIR;Supervision/Assistance - 24 hour    Equipment Recommendations  Other (comment)(to be determined)    Recommendations for Other Services Rehab consult    Precautions / Restrictions Precautions Precautions: Sternal;Fall;Other (comment) Precaution Booklet Issued: No Precaution Comments: R-side weakness Restrictions Weight Bearing Restrictions: (sternal precautions) Other Position/Activity Restrictions: sternal precs       Mobility Bed Mobility Overal bed mobility: Needs Assistance Bed Mobility: Supine to Sit     Supine to sit: Mod assist;HOB elevated;Max assist;+2 for physical assistance     General bed mobility comments: Pt requiring assist for BLEs and trunk elevation due to  limitations with sternal precautions.  Transfers Overall transfer level: Needs assistance Equipment used: Rolling walker (2 wheeled) Transfers: Sit to/from Stand Sit to Stand: +2 physical assistance;Mod assist;Min assist;From elevated surface         General transfer comment: Pt requires cues for hand placement and assist for BLE to EOB and trunk elevation    Balance Overall balance assessment: Needs assistance Sitting-balance support: Single extremity supported;Feet supported Sitting balance-Leahy Scale: Fair Sitting balance - Comments: UE support and min guard for static sitting   Standing balance support: Bilateral upper extremity supported Standing balance-Leahy Scale: Poor Standing balance comment: walker and min assist for static standing, mod assist for dynamic actitivy.                           ADL either performed or assessed with clinical judgement   ADL Overall ADL's : Needs assistance/impaired                                     Functional mobility during ADLs: Minimal assistance;Moderate assistance;+2 for physical assistance;+2 for safety/equipment;Cueing for safety General ADL Comments: Pt with decreased activity tolerance, decreased strength and decreased ability to care for self.     Vision       Perception     Praxis      Cognition Arousal/Alertness: Awake/alert Behavior During Therapy: WFL for tasks assessed/performed Overall Cognitive Status: Difficult to assess Area of Impairment: Following commands;Safety/judgement;Problem solving                 Orientation Level:  Place;Time;Situation     Following Commands: Follows one step commands consistently;Follows one step commands with increased time Safety/Judgement: Decreased awareness of safety;Decreased awareness of deficits   Problem Solving: Slow processing;Decreased initiation;Requires verbal cues;Requires tactile cues General Comments: Following one step  commands, nodding yes/no and giving 'thumbs up' appropriately. Pleasant and agreeable. Pt aware of BM in bed, but did not use call bell for assist.        Exercises    Shoulder Instructions       General Comments BP in sitting at EOB 117/57 (74) 90 BPM; Trach collar at 28% at rest.  Walked pt on 30% trach collar. Sats >94%.     Pertinent Vitals/ Pain       Pain Assessment: Faces Faces Pain Scale: Hurts a little bit Pain Location: generalized Pain Descriptors / Indicators: Grimacing Pain Intervention(s): Limited activity within patient's tolerance  Home Living                                          Prior Functioning/Environment              Frequency  Min 3X/week        Progress Toward Goals  OT Goals(current goals can now be found in the care plan section)  Progress towards OT goals: Progressing toward goals  Acute Rehab OT Goals Patient Stated Goal: Pt unable to state - agreeable to participate OT Goal Formulation: Patient unable to participate in goal setting Time For Goal Achievement: 02/16/20 Potential to Achieve Goals: Good ADL Goals Pt Will Perform Grooming: with min assist;standing Pt Will Transfer to Toilet: with min assist;stand pivot transfer;bedside commode Pt/caregiver will Perform Home Exercise Program: Increased strength;Right Upper extremity;With written HEP provided;With Supervision Additional ADL Goal #1: Pt will complete BADL task with min A and <3 VC's for problem solving Additional ADL Goal #2: Pt will verbalize sternal precautions with <3 VC's while completing BADL task  Plan Discharge plan remains appropriate    Co-evaluation    PT/OT/SLP Co-Evaluation/Treatment: Yes Reason for Co-Treatment: Complexity of the patient's impairments (multi-system involvement);To address functional/ADL transfers PT goals addressed during session: Mobility/safety with mobility OT goals addressed during session: ADL's and self-care       AM-PAC OT "6 Clicks" Daily Activity     Outcome Measure   Help from another person eating meals?: Total(NPO) Help from another person taking care of personal grooming?: A Lot Help from another person toileting, which includes using toliet, bedpan, or urinal?: A Lot Help from another person bathing (including washing, rinsing, drying)?: A Lot Help from another person to put on and taking off regular upper body clothing?: A Lot Help from another person to put on and taking off regular lower body clothing?: A Lot 6 Click Score: 11    End of Session Equipment Utilized During Treatment: Gait belt;Rolling walker;Oxygen  OT Visit Diagnosis: Unsteadiness on feet (R26.81);Other abnormalities of gait and mobility (R26.89);Muscle weakness (generalized) (M62.81);Hemiplegia and hemiparesis Hemiplegia - Right/Left: Right Hemiplegia - dominant/non-dominant: Dominant Hemiplegia - caused by: Cerebral infarction   Activity Tolerance Patient limited by fatigue   Patient Left in chair;with call bell/phone within reach;with chair alarm set;with family/visitor present   Nurse Communication Mobility status        Time: [id number] OT Time Calculation (min): 32 min  Charges: OT General Charges $OT Visit: 1 Visit OT Treatments $Therapeutic Activity: 8-22 mins  Ebony Hail  C, OTR/L Acute Rehabilitation Services Pager: [phone] Office: [phone]    Yogi Arther C 02/08/2020, 2:02 PM

## 2020-02-08 NOTE — Evaluation (Addendum)
Speech Language Pathology Evaluation Patient Details Name: [name] [name] MRN: [id number] DOB: 03-07-1941 Today's Date: 02/08/2020 Time: [id number] SLP Time Calculation (min) (ACUTE ONLY): 18 min  Problem List:  Patient Active Problem List   Diagnosis Date Noted  . Acute hypoxemic respiratory failure ([place])   . Tracheostomy status ([place])   . 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 3-vessel CAD 01/26/2020  . Coronary artery disease of native artery of native heart with stable angina pectoris ([place])   . Precordial chest pain 06/15/2014  . Carotid stenosis 06/15/2014  . Essential hypertension, benign 06/15/2014   Past Medical History:  Past Medical History:  Diagnosis Date  . Coronary artery disease   . Family history of adverse reaction to anesthesia    " My Sister did "  . HTN (hypertension)   . Hyperlipidemia   . Hypothyroidism   . Osteoporosis   . Thyroid carcinoma [facility])    Past Surgical History:  Past Surgical History:  Procedure Laterality Date  . ABDOMINAL HYSTERECTOMY    . ANKLE SURGERY    . APPENDECTOMY    . CARDIAC CATHETERIZATION  01/25/2010  . CHOLECYSTECTOMY    . CORONARY ARTERY BYPASS GRAFT N/A 01/27/2020   Procedure: CORONARY ARTERY BYPASS GRAFTING (CABG) x 4 using LIMA to LAD; RIMA to OM1; Left radia sequential PDA and Left PL.;  Surgeon: [name], MD;  Location: [facility] OR;  Service: Open Heart Surgery;  Laterality: N/A;  BILATERAL IMA  . gamma knife    . IR CT HEAD LTD  01/30/2020  . IR PERCUTANEOUS ART THROMBECTOMY/INFUSION INTRACRANIAL INC DIAG ANGIO  01/30/2020  . LEFT HEART CATH AND CORONARY ANGIOGRAPHY N/A 01/26/2020   Procedure: LEFT HEART CATH AND CORONARY ANGIOGRAPHY;  Surgeon: Belva Crome, MD;  Location: [place] [facility];  Service: Cardiovascular;  Laterality: N/A;  . RADIAL ARTERY HARVEST Left 01/27/2020   Procedure: RADIAL ARTERY HARVEST (OPEN HARVEST);  Surgeon: [name], MD;  Location: [place];  Service: Open Heart Surgery;  Laterality: Left;   . RADIOLOGY WITH ANESTHESIA N/A 01/30/2020   Procedure: IR WITH ANESTHESIA;  Surgeon: Radiologist, Medication, MD;  Location: [place];  Service: Radiology;  Laterality: N/A;  . TEE WITHOUT CARDIOVERSION N/A 01/27/2020   Procedure: TRANSESOPHAGEAL ECHOCARDIOGRAM (TEE);  Surgeon: [name], MD;  Location: [place];  Service: Open Heart Surgery;  Laterality: N/A;  . THYROIDECTOMY    . TOTAL HIP ARTHROPLASTY     HPI:   79 y.o. female admitted 01/26/20 after cardiac cath showed severe vessel disease; s/p CABG x4 on 3/13. 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 Pt with bilateral PEs. Taken to IR and found to have L M1/MCA occlusion, s/p emergent mechanical thrombectomy. ETT 3/12-3/13 (for procedure), 3/15-3/19. S/p trach 3/19. PMH includes CAD, HTN, THA, osteoporosis.   Assessment / Plan / Recommendation Clinical Impression  Passy-Muir Speaking Valve was in place for the entirety of the evaluation and was removed at the end of the session. 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 She was able to produce social phrases and some novel spontaneous phrases. However, she exhibited impairments in naming, repetition, and sentence formulation secondary to word retrieval difficulty. She was able to inconsistently follow 1-step commands and answer simple yes/no questions but exhibited difficulty with auditory comprehension beyond these levels and with reading  comprehension. Formal cognitive-linguistic assessment was deferred due to pt's language deficits. Skilled SLP services are clinically indicated at this time to improve aphasia.     SLP Assessment  SLP Recommendation/Assessment: Patient needs continued Speech Lanaguage Pathology Services SLP Visit Diagnosis: Aphonia (R49.1);Aphasia (R47.01)     Follow Up Recommendations  Inpatient Rehab    Frequency and Duration min 3x week  2 weeks      SLP Evaluation Cognition  Overall Cognitive Status: Difficult to assess(due to aphasia) Orientation Level: Oriented to person;Disoriented to place;Disoriented to time       Comprehension  Auditory Comprehension Overall Auditory Comprehension: Impaired Yes/No Questions: Impaired Basic Immediate Environment Questions: (5/5) Complex Questions: (2/5) Commands: Impaired One Step Basic Commands: (3/4) Two Step Basic Commands: (0/4) Conversation: Simple Visual Recognition/Discrimination Discrimination: Exceptions to [facility] Reading Comprehension Reading Status: Impaired Word level: Impaired    Expression Expression Primary Mode of Expression: Verbal Verbal Expression Overall Verbal Expression: Impaired Initiation: Impaired Automatic Speech: Counting;Day of week;Month of year(0% accuracy) Level of Generative/Spontaneous Verbalization: Phrase Repetition: Impaired Level of Impairment: Phrase level;Word level Naming: Impairment Responsive: (0/4) Confrontation: (7/10) Convergent: (0/5) Divergent: Not tested Verbal Errors: Phonemic paraphasias Pragmatics: No impairment   Oral / Motor  Motor Speech Overall Motor Speech: Appears within functional limits for tasks assessed Respiration: Within functional limits Phonation: Low vocal intensity Resonance: Within functional limits Articulation: Within functional limitis Intelligibility: Intelligible Motor Planning: Witnin functional limits Motor Speech Errors: Not applicable   Kanen Mottola I. Hardin Negus, [place], [place] Office number [phone] Pager [place] 02/08/2020, 3:25 PM

## 2020-02-08 NOTE — Progress Notes (Signed)
Physical Therapy Treatment Patient Details Name: [name] [name] MRN: [id number] DOB: August 28, 1941 Today's Date: 02/08/2020    History of Present Illness Pt is a 79 y.o. female admitted 01/26/20 after cardiac cath showed severe vessel disease; s/p CABG x4 on 3/13. 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 Pt with bilateral PEs. Taken to IR and found to have L M1/MCA occlusion, s/p emergent mechanical thrombectomy. ETT 3/12-3/13 (for procedure), 3/15-3/19. S/p trach 3/19. PMH includes CAD, HTN, THA, osteoporosis.    PT Comments    Pt admitted with above diagnosis. Pt was able to ambulate 50 feet with min to mod assist with RW.  Pt progressing daily.  VSS.   Pt currently with functional limitations due to balance and endurance deficits. Pt will benefit from skilled PT to increase their independence and safety with mobility to allow discharge to the venue listed below.     Follow Up Recommendations  CIR;Supervision/Assistance - 24 hour     Equipment Recommendations  (TBD)    Recommendations for Other Services       Precautions / Restrictions Precautions Precautions: Sternal;Fall;Other (comment) Precaution Booklet Issued: No Precaution Comments: R-side weakness Restrictions Weight Bearing Restrictions: (sternal precautions) Other Position/Activity Restrictions: sternal precs    Mobility  Bed Mobility Overal bed mobility: Needs Assistance Bed Mobility: Supine to Sit     Supine to sit: Mod assist;HOB elevated;Max assist;+2 for physical assistance     General bed mobility comments: Assist to roll left >right due to can reach with left UE and not as much with right.  Assist to bring legs off of bed, elevate trunk into sitting and bring hips to EOB. used pad to assist   Transfers Overall transfer level: Needs assistance Equipment used: Rolling walker (2 wheeled) Transfers: Sit to/from Stand Sit  to Stand: +2 physical assistance;Mod assist;Min assist;From elevated surface         General transfer comment: Assist to bring hips up and for balance. Verbal/tactile cues for hand placement to follow sternal precautions. pt able to place rt hand on walker  Ambulation/Gait Ambulation/Gait assistance: Mod assist;Min assist;+2 safety/equipment Gait Distance (Feet): 50 Feet Assistive device: Rolling walker (2 wheeled) Gait Pattern/deviations: Decreased step length - right;Decreased step length - left;Shuffle;Trunk flexed;Step-through pattern Gait velocity: decr Gait velocity interpretation: <1.31 ft/sec, indicative of household ambulator General Gait Details: Assist for balance and support. Assist needed to steer RW.  Also cued for sequencing steps as well.  Pt tends to lean forward as she fatigues therefore also cued for postural stability.     Stairs             Wheelchair Mobility    Modified Rankin (Stroke Patients Only) Modified Rankin (Stroke Patients Only) Pre-Morbid Rankin Score: No symptoms Modified Rankin: Moderately severe disability     Balance Overall balance assessment: Needs assistance Sitting-balance support: Single extremity supported;Feet supported Sitting balance-Leahy Scale: Fair Sitting balance - Comments: UE support and min guard for static sitting   Standing balance support: Bilateral upper extremity supported Standing balance-Leahy Scale: Poor Standing balance comment: walker and min assist for static standing, mod assist for dynamic actitivy.                            Cognition Arousal/Alertness: Awake/alert Behavior During Therapy: WFL for tasks assessed/performed Overall Cognitive Status: Difficult to assess Area of Impairment: Following commands;Safety/judgement;Problem solving  Orientation Level: Place;Time;Situation     Following Commands: Follows one step commands consistently;Follows one step commands  with increased time Safety/Judgement: Decreased awareness of safety;Decreased awareness of deficits   Problem Solving: Slow processing;Decreased initiation;Requires verbal cues;Requires tactile cues General Comments: Following one step commands, nodding yes/no and giving 'thumbs up' appropriately. Pleasant and agreeable      Exercises General Exercises - Lower Extremity Ankle Circles/Pumps: AROM;Both;10 reps;Supine Long Arc Quad: AROM;Both;10 reps;Seated    General Comments General comments (skin integrity, edema, etc.): Trach collar at 28% at rest.  Walked pt on 30% trach collar. Sats >94%.  Other VSS      Pertinent Vitals/Pain Pain Assessment: Faces Faces Pain Scale: Hurts a little bit Pain Location: generalized Pain Descriptors / Indicators: Grimacing Pain Intervention(s): Limited activity within patient's tolerance;Monitored during session;Repositioned    Home Living                      Prior Function            PT Goals (current goals can now be found in the care plan section) Progress towards PT goals: Progressing toward goals    Frequency    Min 4X/week      PT Plan Current plan remains appropriate    Co-evaluation PT/OT/SLP Co-Evaluation/Treatment: Yes Reason for Co-Treatment: Complexity of the patient's impairments (multi-system involvement);Necessary to address cognition/behavior during functional activity;For patient/therapist safety PT goals addressed during session: Mobility/safety with mobility        AM-PAC PT "6 Clicks" Mobility   Outcome Measure  Help needed turning from your back to your side while in a flat bed without using bedrails?: A Lot Help needed moving from lying on your back to sitting on the side of a flat bed without using bedrails?: A Lot Help needed moving to and from a bed to a chair (including a wheelchair)?: A Lot Help needed standing up from a chair using your arms (e.g., wheelchair or bedside chair)?: A Lot Help  needed to walk in hospital room?: A Lot Help needed climbing 3-5 steps with a railing? : Total 6 Click Score: 11    End of Session Equipment Utilized During Treatment: Gait belt;Oxygen Activity Tolerance: Patient tolerated treatment well Patient left: in chair;with call bell/phone within reach;with chair alarm set(nurse techs bathing patient) Nurse Communication: Mobility status PT Visit Diagnosis: Other abnormalities of gait and mobility (R26.89);Hemiplegia and hemiparesis Hemiplegia - Right/Left: Right Hemiplegia - dominant/non-dominant: Dominant Hemiplegia - caused by: Cerebral infarction     Time: [id number] PT Time Calculation (min) (ACUTE ONLY): 28 min  Charges:  $Gait Training: 8-22 mins                     Correen Bubolz W,PT [facility] Pager:  [phone]  Office:  [place] 02/08/2020, 1:27 PM

## 2020-02-09 LAB — BASIC METABOLIC PANEL
Anion gap: 9 (ref 5–15)
BUN: 17 mg/dL (ref 8–23)
CO2: 26 mmol/L (ref 22–32)
Calcium: 9.4 mg/dL (ref 8.9–10.3)
Chloride: 102 mmol/L (ref 98–111)
Creatinine, Ser: 0.68 mg/dL (ref 0.44–1.00)
GFR calc Af Amer: >60 mL/min (ref >60)
GFR calc non Af Amer: >60 mL/min (ref >60)
Glucose, Bld: 151 mg/dL — ABNORMAL HIGH (ref 70–99)
Potassium: 3.7 mmol/L (ref 3.5–5.1)
Sodium: 137 mmol/L (ref 135–145)

## 2020-02-09 LAB — GLUCOSE, CAPILLARY
Glucose-Capillary: 149 mg/dL — ABNORMAL HIGH (ref 70–99)
Glucose-Capillary: 112 mg/dL — ABNORMAL HIGH (ref 70–99)
Glucose-Capillary: 131 mg/dL — ABNORMAL HIGH (ref 70–99)
Glucose-Capillary: 128 mg/dL — ABNORMAL HIGH (ref 70–99)
Glucose-Capillary: 123 mg/dL — ABNORMAL HIGH (ref 70–99)
Glucose-Capillary: 136 mg/dL — ABNORMAL HIGH (ref 70–99)

## 2020-02-09 LAB — CBC
HCT: 29.5 % — ABNORMAL LOW (ref 36.0–46.0)
Hemoglobin: 9.4 g/dL — ABNORMAL LOW (ref 12.0–15.0)
MCH: 29.5 pg (ref 26.0–34.0)
MCHC: 31.9 g/dL (ref 30.0–36.0)
MCV: 92.5 fL (ref 80.0–100.0)
Platelets: 426 10*3/uL — ABNORMAL HIGH (ref 150–400)
RBC: 3.19 MIL/uL — ABNORMAL LOW (ref 3.87–5.11)
RDW: 15 % (ref 11.5–15.5)
WBC: 14.4 10*3/uL — ABNORMAL HIGH (ref 4.0–10.5)
nRBC: 0 % (ref 0.0–0.2)

## 2020-02-09 MED ORDER — VANCOMYCIN HCL IN DEXTROSE 1-5 GM/200ML-% IV SOLN
1000.0000 mg | INTRAVENOUS | Status: DC
  Administered 2020-02-09: 1000 mg via INTRAVENOUS
  Filled 2020-02-09 (×2): qty 200

## 2020-02-09 MED ORDER — PIPERACILLIN-TAZOBACTAM 3.375 G IVPB
3.3750 g | Freq: Three times a day (TID) | INTRAVENOUS | Status: DC
  Administered 2020-02-09 – 2020-02-10 (×3): 3.375 g via INTRAVENOUS
  Filled 2020-02-09 (×3): qty 50

## 2020-02-09 NOTE — Progress Notes (Signed)
ANTICOAGULATION CONSULT NOTE - [place] for Heparin to enoxaparin Indication: pulmonary embolus, stroke and DVT, and recent CABG  Allergies  Allergen Reactions  . Bee Venom Swelling    Massive swelling  . Penicillins Other (See Comments)    UNSPECIFIED REACTION  Did it involve swelling of the face/tongue/throat, SOB, or low BP? Unknown Did it involve sudden or severe rash/hives, skin peeling, or any reaction on the inside of your mouth or nose? Unknown Did you need to seek medical attention at a hospital or doctor's office? Unknown When did it last happen?1945 If all above answers are "NO", may proceed with cephalosporin use.  . Adhesive [Tape] Rash  . Morphine And Related Rash  . Sulfa Antibiotics Nausea And Vomiting and Rash    Patient Measurements: Height: 5\' 3"  (160 cm) Weight: 144 lb 13.5 oz (65.7 kg) IBW/kg (Calculated) : 52.4 Heparin Dosing Weight: 65.3 kg  Vital Signs: Temp: 98.2 F (36.8 C) (03/25 0803) Temp Source: Oral (03/25 0803) BP: 133/58 (03/25 0831) Pulse Rate: 82 (03/25 0803)  Labs: Recent Labs    02/09/20 0358  HGB 9.4*  HCT 29.5*  PLT 426*  CREATININE 0.68    Estimated Creatinine Clearance: 54.5 mL/min (by C-G formula based on SCr of 0.68 mg/dL).  Medications:  Plavix is being held as changing to full anticoagulation with IV Heparin  Assessment: 79 year old female admitted with angina on 3/11 and s/p CABG on 3/12 who had acute stroke symptoms on 3/15 AM. CTA of Head and Neck showed an acute MCA infarct and bilateral subsegmental acute pulmonary emboli. She is now s/p IR guided mechanical thrombectomy and Pharmacy has been consulted to start IV Heparin therapy. 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   HIT antibody negative, lupus anticoagulant screen negative, anti-Jo antibody neg, extractable  nuclear antigen neg.  Heparin transitioned to lovenox 3/22.  SCr 0.68 stable, Wt stable, H/H trending up.    Goal of Therapy:  Anti-Xa level 0.6-1 units/ml 4hrs after LMWH dose given Monitor platelets by anticoagulation protocol: Yes   Plan:  Continue enoxaparin 1mg /kg (65mg ) SQ every 12 hours Monitor renal function, Wt, CBC and s/s bleeding  Bertis Ruddy, PharmD Clinical Pharmacist Please check AMION for all [place] numbers 02/09/2020 9:05 AM

## 2020-02-09 NOTE — Progress Notes (Signed)
  Speech Language Pathology Treatment: Nada Boozer Speaking valve;Dysphagia  Patient Details Name: BLERTA ANTWINE MRN: [id number] DOB: June 03, 1941 Today's Date: 02/09/2020 Time: 1020-1107 SLP Time Calculation (min) (ACUTE ONLY): 47 min  Assessment / Plan / Recommendation Clinical Impression  Pt was seen for treatment with her nephew, Ozzie Hoyle, present. She was alert and cooperative throughout the session. She was able to expectorate secretions from trach and tracheal suctioning was conducted by RN.  Vitals were RR 28, SpO2 94, and HR 81 at baseline. She tolerated PMSV for 37 minutes with vitals ranging RR 19-22, SpO2 94-97, and HR 78 during this period with intermittent reports of dyspnea towards the end of the session. Vocal quality was wet at the onset of the session and was improved with prompted coughing. Pt inconsistently demonstrated coughing with thin liquids via tsp and with full-tsp boluses of puree but tolerated 1/2 boluses of puree without overt s/sx of aspiration. Multiple swallows continue to be noted, suggesting possible pharyngeal residue. Lingual coating was observed  suggestive of possible thrush and RN was advised of this. Pt's verbal output was improved compared that which was noted yesterday and her utterance length has increased. With additional processing time she produced, "I think I'm beginning to realize.Marland KitchenMarland KitchenI'm remembering." but continues to exhibit word retrieval difficulty during conversation and structured naming tasks. Pt's family was educated regarding strategies to facilitate word retrieval and reduce pt's frustration. SLP will continue to follow pt.     HPI HPI:  79 y.o. female admitted 01/26/20 after cardiac cath showed severe vessel disease; s/p CABG x4 on 3/13. 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 Pt with bilateral PEs. Taken to IR and found to have L M1/MCA occlusion, s/p emergent  mechanical thrombectomy. ETT 3/12-3/13 (for procedure), 3/15-3/19. S/p trach 3/19. PMH includes CAD, HTN, THA, osteoporosis.      SLP Plan  Continue with current plan of care       Recommendations  Diet recommendations: NPO Medication Administration: Via alternative means      Patient may use Passy-Muir Speech Valve: Intermittently with supervision PMSV Supervision: Full         Oral Care Recommendations: Oral care QID Follow up Recommendations: Inpatient Rehab SLP Visit Diagnosis: Aphonia (R49.1);Dysphagia, unspecified (R13.10);Aphasia (R47.01) Plan: Continue with current plan of care       Kawika Bischoff I. Hardin Negus, [place], [place] Office number [phone] Pager [place] 02/09/2020, 1:55 PM

## 2020-02-09 NOTE — Progress Notes (Signed)
10 Days Post-Op Procedure(s) (LRB): IR WITH ANESTHESIA (N/A) Subjective: Awake and appropriately responsive.  CorTrak and trach collar (FiO2 28%) in place.   Objective: Vital signs in last 24 hours: Temp:  [98 F (36.7 C)-98.5 F (36.9 C)] 98.2 F (36.8 C) (03/25 0803) Pulse Rate:  [70-93] 82 (03/25 0803) Cardiac Rhythm: Normal sinus rhythm (03/25 0820) Resp:  [19-33] 26 (03/25 0831) BP: (96-155)/(50-77) 133/58 (03/25 0831) SpO2:  [94 %-99 %] 96 % (03/25 0803) FiO2 (%):  [28 %] 28 % (03/25 0831) Weight:  [65.7 kg] 65.7 kg (03/25 0500)  Hemodynamic parameters for last 24 hours: CVP:  [10 mmHg] 10 mmHg  Intake/Output from previous day: 03/24 0701 - 03/25 0700 In: 1200 [NG/GT:1200] Out: 1455 [Urine:1455] Intake/Output this shift: No intake/output data recorded.  General appearance: alert, cooperative and no apparent distress. Neurologic: right sided weakness Heart: regular rate and rhythm Lungs: breath sounds are clear anterior Abdomen: soft and non-tender Extremities: Aall warm and well perfused. RUE PICC site has a clean dressing. Has SCD's to LE's.the left arm incision is intact and dry, no m/s deficits in left hand.  Wound: The sternotomy incision is well approximated and dry.   Lab Results: Recent Labs    02/09/20 0358  WBC 14.4*  HGB 9.4*  HCT 29.5*  PLT 426*   BMET:  Recent Labs    02/09/20 0358  NA 137  K 3.7  CL 102  CO2 26  GLUCOSE 151*  BUN 17  CREATININE 0.68  CALCIUM 9.4    PT/INR: No results for input(s): LABPROT, INR in the last 72 hours. 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 Pre-op EF 35% by echo and 55% by LV-gram at cath. On ASA, atorvastatin, metoprolol, lisinopril, and isosorbide (for radial graft patency). Stable cardiac rhythm.  -Post-op left MCA CVA on POD-3 with subsequent thrombectomy / recanalization of occluded  M1/MCA by IR.  Continue PT/OT/ST.  Evaluated by inpatient rehab team and CIR recommended when medically stable.    -Post-op Seizure- likely secondary to above. On Kepra with no apparent subsequent seizures.  -Bilateral pulmonary emboli post-op- Initially treated with heparin and now on  Lovenox therapy that is being dosed by pharmacy. TEE demonstrated a PFO with L->R shunt, no residual LAA thrombus.   -Post-op respiratory failure- required re-intubation and eventual trach annd subsequently has had steady improvement. Currently in no respiratory distress with good air exchange and acceptable O2 sats on 28% trach collar. Lurline Idol was down-sized to #6 cuffless cannula yesterday. Purulent secretions noted at the site were sent for Cx. Initial result shows multiple organisms that have been re-incubated for better growth.   -Leukocytosis- Steady increase over past few days. Will start empiric Zosyn and vanc until culture form the peristomal drainage is finalized. Also re-check CXR in AM and send UA today.   -Expected acute blood loss anemia-   Hct trending up, n indication for transfusion.   -History of thyroid cancer, s/p thyroidectomy- on appropriate replacement therapy.      LOS:  14 days    Antony Odea, [name] [phone] 02/09/2020

## 2020-02-09 NOTE — Progress Notes (Signed)
Inpatient Rehabilitation-Admissions Coordinator   Met with pt bedside as follow up from PM&R MD consult. Pt needed assistance to readjust in bed and sit up comfortably. She was able to indicate with gestures what she needed. AC dicussed recommended rehab program and expectations of CIR program. Pt appeared interested but [facility] unsure of how much information she was taking in as [facility] did notice come confusion/inconsistency with parts of her history she provided. AC contacted her emergency contact [name] (who goes by [facility] and who had just visited at the bedside today). 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 I explained the recommended rehab program as well as the recommended support required at DC. At this time, Ozzie Hoyle requested time to discuss this with his family to see if they can provide that type of assistance at DC. Ozzie Hoyle knows that if we cannot confirm support at DC, the patient will need to go SNF.   Will follow up once I hear from pt's family.   Raechel Ache, OTR/L  Rehab Admissions Coordinator  [phone] 02/09/2020 2:15 PM

## 2020-02-09 NOTE — Progress Notes (Signed)
Physical Therapy Treatment Patient Details Name: [name] [name] MRN: [id number] DOB: 27-Mar-1941 Today's Date: 02/09/2020    History of Present Illness Pt is a 79 y.o. female admitted 01/26/20 after cardiac cath showed severe vessel disease; s/p CABG x4 on 3/13. 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 Pt with bilateral PEs. Taken to IR and found to have L M1/MCA occlusion, s/p emergent mechanical thrombectomy. ETT 3/12-3/13 (for procedure), 3/15-3/19. S/p trach 3/19. PMH includes CAD, HTN, THA, osteoporosis.    PT Comments    Pt reporting fatigue today but still able to work with PT.  Required min-mod A for transfers with assist of 2 for safety with lines/leads/chair follow.  Required frequent cues for sternal precautions and use of pillow for transfers and coughing.  Continue to recommend CIR at d/c.      Follow Up Recommendations  CIR;Supervision/Assistance - 24 hour     Equipment Recommendations  Wheelchair cushion (measurements PT);Wheelchair (measurements PT);3in1 (PT)    Recommendations for Other Services       Precautions / Restrictions Precautions Precautions: Sternal Precaution Comments: Reviewed sternal precautions Restrictions Weight Bearing Restrictions: Yes(sternal)    Mobility  Bed Mobility Overal bed mobility: Needs Assistance Bed Mobility: Supine to Sit     Supine to sit: Mod assist;+2 for safety/equipment;HOB elevated     General bed mobility comments: Able to bring legs off bed but required assist for trunk and cues for precautions  Transfers Overall transfer level: Needs assistance Equipment used: Rolling walker (2 wheeled) Transfers: Sit to/from Stand Sit to Stand: Mod assist;+2 safety/equipment         General transfer comment: Cues for hand placement for sternal precautions; cued for use of momentum.  Performed 3 sit to stands during session and 3  additional in a row for exercise/strengthening  Ambulation/Gait Ambulation/Gait assistance: Min assist;+2 safety/equipment Gait Distance (Feet): 3 Feet Assistive device: Rolling walker (2 wheeled) Gait Pattern/deviations: Decreased stride length;Shuffle     General Gait Details: Pt nodding "yes" to fatigue; unable to ambulate as far today-only took steps to chair; cued for posture and use of RW   Stairs             Wheelchair Mobility    Modified Rankin (Stroke Patients Only) Modified Rankin (Stroke Patients Only) Pre-Morbid Rankin Score: No symptoms Modified Rankin: Moderately severe disability     Balance Overall balance assessment: Needs assistance Sitting-balance support: Single extremity supported;Feet supported Sitting balance-Leahy Scale: Fair     Standing balance support: Bilateral upper extremity supported Standing balance-Leahy Scale: Poor Standing balance comment: Tendency to lean posteriorly today requiring mod A initially progressed to min A with cues                            Cognition Arousal/Alertness: Awake/alert Behavior During Therapy: WFL for tasks assessed/performed Overall Cognitive Status: Difficult to assess(aphasia and trach)                                 General Comments: Follows one step commands; nods yes/no appropriately; pleasant and agreeable; attempted to use communication board with pt but unable      Exercises General Exercises - Lower Extremity Ankle Circles/Pumps: AROM;Both;10 reps;Supine Long Arc Quad: AROM;Both;10 reps;Seated Hip Flexion/Marching: AROM;Both;10 reps;Seated Other Exercises Other Exercises: hip add pillow squeeze x 10 Other  Exercises: sit to stand x 3    General Comments General comments (skin integrity, edema, etc.): Pt on 28% trach collar at rest and 30% for activity.  Sats 90% or greater.  RR did increase to 44 with multiple sit to stands but recovered to 20 with 2 mins rest.    Otherwise VSS.      Pertinent Vitals/Pain Pain Assessment: No/denies pain    Home Living                      Prior Function            PT Goals (current goals can now be found in the care plan section) Progress towards PT goals: Progressing toward goals    Frequency    Min 4X/week      PT Plan Current plan remains appropriate    Co-evaluation              AM-PAC PT "6 Clicks" Mobility   Outcome Measure  Help needed turning from your back to your side while in a flat bed without using bedrails?: A Lot Help needed moving from lying on your back to sitting on the side of a flat bed without using bedrails?: A Lot Help needed moving to and from a bed to a chair (including a wheelchair)?: A Lot Help needed standing up from a chair using your arms (e.g., wheelchair or bedside chair)?: A Lot Help needed to walk in hospital room?: A Lot Help needed climbing 3-5 steps with a railing? : Total 6 Click Score: 11    End of Session Equipment Utilized During Treatment: Gait belt;Oxygen Activity Tolerance: Patient tolerated treatment well Patient left: in chair;with call bell/phone within reach;with chair alarm set;with nursing/sitter in room Nurse Communication: Mobility status PT Visit Diagnosis: Other abnormalities of gait and mobility (R26.89);Hemiplegia and hemiparesis Hemiplegia - Right/Left: Right Hemiplegia - dominant/non-dominant: Dominant Hemiplegia - caused by: Cerebral infarction     Time: 1530-1605 PT Time Calculation (min) (ACUTE ONLY): 35 min  Charges:  $Therapeutic Exercise: 8-22 mins $Therapeutic Activity: 8-22 mins                     Maggie Font, PT Acute Rehab Services Pager [phone] [facility] [phone] [facility] [phone]    Karlton Lemon 02/09/2020, 4:37 PM

## 2020-02-09 NOTE — TOC Benefit Eligibility Note (Signed)
Transition of Care [facility]) Benefit Eligibility Note    Patient Details  Name: [name] [name] MRN: [id number] Date of Birth: 06-07-1941   Medication/Dose: Eliquis 67m BID and Xarelto 162mBID  Covered?: Yes  Tier: 3 Drug  Prescription Coverage Preferred Pharmacy: Any retail Pharmace  Spoke with Person/Company/Phone Number:: [employer] / [phone]-Pay: 45.00 for a 30 day supply retail and 90.00 for a 90 day supply Mail Order  Prior Approval: No  Deductible: Met  Additional Notes: The co pay is the same for both of the medications    [place] Py[place] Number: 02/09/2020, 8:33 AM

## 2020-02-09 NOTE — Progress Notes (Signed)
Pharmacy Antibiotic Note  [name] [name] is a 79 y.o. female admitted on 01/26/2020 s/p CABG with CVA this admission, s/p trach with purulent secretions, leukocytosis.  Pharmacy has been consulted for vancomycin and zosyn dosing.  Plan: Vancomycin 1000 mg IV every 24 hours  Goal AUC 400-550. Expected AUC: 464  SCr used: 0.8  Zosyn 3.375g IV every 8 hours (extended infusion) Monitor renal function, Cx update and clinical progression to narrow Vancomycin levels as needed  Height: 5\' 3"  (160 cm) Weight: 144 lb 13.5 oz (65.7 kg) IBW/kg (Calculated) : 52.4  Temp (24hrs), Avg:98.2 F (36.8 C), Min:98 F (36.7 C), Max:98.5 F (36.9 C)  Recent Labs  Lab 02/03/20 0241 02/04/20 0413 02/05/20 0422 02/06/20 0409 02/09/20 0358  WBC 6.9 8.9 10.6* 11.4* 14.4*  CREATININE 0.69 0.85 0.70 0.61 0.68    Estimated Creatinine Clearance: 54.5 mL/min (by C-G formula based on SCr of 0.68 mg/dL).    Allergies  Allergen Reactions  . Bee Venom Swelling    Massive swelling  . Penicillins Other (See Comments)    UNSPECIFIED REACTION  Did it involve swelling of the face/tongue/throat, SOB, or low BP? Unknown Did it involve sudden or severe rash/hives, skin peeling, or any reaction on the inside of your mouth or nose? Unknown Did you need to seek medical attention at a hospital or doctor's office? Unknown When did it last happen?1945 If all above answers are "NO", may proceed with cephalosporin use.  . Adhesive [Tape] Rash  . Morphine And Related Rash  . Sulfa Antibiotics Nausea And Vomiting and Rash    Antimicrobials this admission: 3/25 vanc >> 3/25 zosyn >>  Dose adjustments this admission: n/a  Microbiology results: 3/24 Trach aspirate: reincubated  Bertis Ruddy, PharmD Clinical Pharmacist Please check AMION for all [place] numbers 02/09/2020 12:29 PM

## 2020-02-09 NOTE — Plan of Care (Signed)
  Problem: Education: Goal: Knowledge of General Education information will improve Description: Including pain rating scale, medication(s)/side effects and non-pharmacologic comfort measures Outcome: Progressing   Problem: Health Behavior/Discharge Planning: Goal: Ability to manage health-related needs will improve Outcome: Progressing   Problem: Clinical Measurements: Goal: Ability to maintain clinical measurements within normal limits will improve Outcome: Progressing Goal: Will remain free from infection Outcome: Progressing Goal: Diagnostic test results will improve Outcome: Progressing Goal: Respiratory complications will improve Outcome: Progressing Goal: Cardiovascular complication will be avoided Outcome: Progressing   Problem: Activity: Goal: Risk for activity intolerance will decrease Outcome: Progressing   Problem: Nutrition: Goal: Adequate nutrition will be maintained Outcome: Progressing   Problem: Coping: Goal: Level of anxiety will decrease Outcome: Progressing   Problem: Elimination: Goal: Will not experience complications related to bowel motility Outcome: Progressing Goal: Will not experience complications related to urinary retention Outcome: Progressing   Problem: Pain Managment: Goal: General experience of comfort will improve Outcome: Progressing   Problem: Safety: Goal: Ability to remain free from injury will improve Outcome: Progressing   Problem: Skin Integrity: Goal: Risk for impaired skin integrity will decrease Outcome: Progressing   Problem: Education: Goal: Will demonstrate proper wound care and an understanding of methods to prevent future damage Outcome: Progressing Goal: Knowledge of disease or condition will improve Outcome: Progressing Goal: Knowledge of the prescribed therapeutic regimen will improve Outcome: Progressing Goal: Individualized Educational Video(s) Outcome: Progressing   Problem: Activity: Goal: Risk for  activity intolerance will decrease Outcome: Progressing   Problem: Cardiac: Goal: Will achieve and/or maintain hemodynamic stability Outcome: Progressing   Problem: Clinical Measurements: Goal: Postoperative complications will be avoided or minimized Outcome: Progressing   Problem: Respiratory: Goal: Respiratory status will improve Outcome: Progressing   Problem: Skin Integrity: Goal: Wound healing without signs and symptoms of infection Outcome: Progressing Goal: Risk for impaired skin integrity will decrease Outcome: Progressing   Problem: Urinary Elimination: Goal: Ability to achieve and maintain adequate renal perfusion and functioning will improve Outcome: Progressing   Problem: Education: Goal: Knowledge of secondary prevention will improve Outcome: Progressing Goal: Knowledge of patient specific risk factors addressed and post discharge goals established will improve Outcome: Progressing Goal: Individualized Educational Video(s) Outcome: Progressing

## 2020-02-09 NOTE — Plan of Care (Signed)
  Problem: Health Behavior/Discharge Planning: Goal: Ability to manage health-related needs will improve Outcome: Not Progressing   

## 2020-02-10 ENCOUNTER — Inpatient Hospital Stay (HOSPITAL_COMMUNITY): Payer: PPO

## 2020-02-10 DIAGNOSIS — J384 Edema of larynx: Secondary | ICD-10-CM

## 2020-02-10 LAB — CBC
HCT: 28.7 % — ABNORMAL LOW (ref 36.0–46.0)
Hemoglobin: 9.2 g/dL — ABNORMAL LOW (ref 12.0–15.0)
MCH: 29.9 pg (ref 26.0–34.0)
MCHC: 32.1 g/dL (ref 30.0–36.0)
MCV: 93.2 fL (ref 80.0–100.0)
Platelets: 474 10*3/uL — ABNORMAL HIGH (ref 150–400)
RBC: 3.08 MIL/uL — ABNORMAL LOW (ref 3.87–5.11)
RDW: 14.8 % (ref 11.5–15.5)
WBC: 12.2 10*3/uL — ABNORMAL HIGH (ref 4.0–10.5)
nRBC: 0 % (ref 0.0–0.2)

## 2020-02-10 LAB — URINALYSIS, ROUTINE W REFLEX MICROSCOPIC
Bilirubin Urine: NEGATIVE
Glucose, UA: NEGATIVE mg/dL
Hgb urine dipstick: NEGATIVE
Ketones, ur: NEGATIVE mg/dL
Leukocytes,Ua: NEGATIVE
Nitrite: NEGATIVE
Protein, ur: 30 mg/dL — AB
Specific Gravity, Urine: 1.014 (ref 1.005–1.030)
pH: 8 (ref 5.0–8.0)

## 2020-02-10 LAB — CULTURE, RESPIRATORY W GRAM STAIN: Culture: NORMAL

## 2020-02-10 LAB — BASIC METABOLIC PANEL
Anion gap: 12 (ref 5–15)
BUN: 15 mg/dL (ref 8–23)
CO2: 25 mmol/L (ref 22–32)
Calcium: 9.1 mg/dL (ref 8.9–10.3)
Chloride: 101 mmol/L (ref 98–111)
Creatinine, Ser: 0.86 mg/dL (ref 0.44–1.00)
GFR calc Af Amer: >60 mL/min (ref >60)
GFR calc non Af Amer: >60 mL/min (ref >60)
Glucose, Bld: 154 mg/dL — ABNORMAL HIGH (ref 70–99)
Potassium: 3.7 mmol/L (ref 3.5–5.1)
Sodium: 138 mmol/L (ref 135–145)

## 2020-02-10 LAB — GLUCOSE, CAPILLARY
Glucose-Capillary: 114 mg/dL — ABNORMAL HIGH (ref 70–99)
Glucose-Capillary: 128 mg/dL — ABNORMAL HIGH (ref 70–99)
Glucose-Capillary: 133 mg/dL — ABNORMAL HIGH (ref 70–99)
Glucose-Capillary: 140 mg/dL — ABNORMAL HIGH (ref 70–99)
Glucose-Capillary: 141 mg/dL — ABNORMAL HIGH (ref 70–99)
Glucose-Capillary: 150 mg/dL — ABNORMAL HIGH (ref 70–99)
Glucose-Capillary: 99 mg/dL (ref 70–99)

## 2020-02-10 MED ORDER — SODIUM CHLORIDE 0.9 % IV SOLN
2.0000 g | INTRAVENOUS | Status: AC
  Administered 2020-02-10 – 2020-02-13 (×4): 2 g via INTRAVENOUS
  Filled 2020-02-10: qty 2
  Filled 2020-02-10: qty 20
  Filled 2020-02-10 (×2): qty 2

## 2020-02-10 NOTE — Progress Notes (Addendum)
NAME:  [name] [name], MRN:  [id number], DOB:  1940-12-03, LOS: 37 ADMISSION DATE:  01/26/2020, CONSULTATION DATE:  01/30/2020 REFERRING MD:  Rory Percy CHIEF COMPLAINT:  Right hemiplegia   Brief History        This 79 year old was admitted on 3/8 for evaluation of chest pain.  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  She was up ambulating  and suddenly developed a right hemiplegia.  CTA showed an M1 occlusion and thrombectomy was performed.  An attempt was made to extubate the patient post procedure but this was not tolerated.  Of note at the time the CTA was performed she was also found to have pulmonary emboli.  History of present illness       History is as noted above with the following additions.  Intraoperative TEE showed essentially normal LV systolic dysfunction there was some LVH and a PFO with a left to right flow.  There is no thrombus noted in the left atrial appendage.  Past Medical History  Thyroid cancer, hypertension, hyperlipidemia.  Significant Hospital Events   Cardiac cath 3/12, CABG 3/13 3/15: Acute stroke with right-sided hemiplegia, also found to have acute pulmonary emboli 3/16 had seizure placed on Keppra 3/18 extubated, had upper airway edema, reintubated, noted to have significant airway swelling 3/19 tracheostomy placed at bedside 320 through 3/23: Tolerating aerosol trach collar, tolerating 28% is having bloody tracheal secretions 3/24: Changed to cuffless trach at bedside moved to stepdown unit Consults:  Thoracic surgery, neurology, interventional radiology.  Procedures:  Four-vessel CABG, left M1 thrombectomy 3/19 trach  Significant Diagnostic Tests:  3/15 LE venous doppler:  RIGHT:  - There is no evidence of deep vein thrombosis in the lower extremity.    - No cystic structure found in the popliteal fossa.    LEFT:  - Findings consistent with acute deep vein thrombosis involving  the left  posterior tibial veins.  - No cystic structure found in the popliteal fossa.  3/15 echo: LVEF [id number], grade 1 diastolic dysfunction, LA dilation, RVSP mildly elevated 3/16 cth: No acute intracranial hemorrhage. Likely evolving recent infarction of the lentiform nucleus. 3/15 cta head/neck 1. Emergent large vessel occlusion at the left M1 segment. 2. Aspects of 10 with CT perfusion suggesting core infarct of 74 cc and 85 cc of penumbra. 3. Flow reducing left ICA bulb stenosis. 4. Bilateral subsegmental acute pulmonary emboli. 5. Proximal occlusion of the non dominant left vertebral artery with reconstitution at the dura. 3/16 eeg: This study showed evidence of epileptogenicity arising from left frontotemporal as well as cortical dysfunction in left hemisphere likely secondary to underlying stroke. No seizures were seen throughout the recording.  Micro Data:  3/8 sars2: neg  Antimicrobials:  Vancomycin and Zosyn started 3/25, discontinued 3/26 Ceftriaxone started 3/26, to get 4 total doses of this  Interim history/subjective:   No distress  Objective   Blood pressure (Abnormal) 100/49, pulse 63, temperature 98 F (36.7 C), temperature source Oral, resp. rate 10, height 5\' 3"  (1.6 m), weight 65.6 kg, SpO2 98 %.    FiO2 (%):  [28 %] 28 %   Intake/Output Summary (Last 24 hours) at 02/10/2020 1253 Last data filed at 02/10/2020 0914 Gross per 24 hour  Intake 110 ml  Output 400 ml  Net -290 ml   Filed Weights   02/08/20 2353 02/09/20 0500 02/10/20 0508  Weight: 65.7 kg 65.7 kg 65.6 kg    Examination:  General Pleasant 79 year old female resting in bed currently in no acute distress HEENT normocephalic atraumatic pupils equal reactive size 6 cuffless tracheostomy in place.  Notable turbulent upper airway noises upon placement of Passy-Muir valve, these resolve immediately after removal of PMV.  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 complicated by seizure Acute Pulmonary emboli Acute hypoxic Respiratory failure now tracheostomy dependent following failed extubation due to upper airway obstruction from vocal cord edema Status post coronary artery bypass grafting on 3/13 combined HF, htn:  TEE with PFO -LVEF 30-35% with grade 1 diastolic dysfunction Acute L LE DVT Hypothyroidism   Pulmonary problem list  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  Attempted PMV at bedside.  Develops immediate turbulent upper airway noises when PMV in place, did become slightly more labored, she tolerated this better than previous attempts however I was unable to get her to phonate any recognizable audible words, given the stark difference between airway assessment from cuffed to uncuffed status I think there is still significant anatomical abnormality above the level of the trach, however she was getting airflow to the level of her vocal cords at least clinically.  Plan/recommendation Would continue current tracheostomy through the weekend If secretions remain minimal then could change to size 4 cuffless, with hopes that phonation quality and tolerance of Passy-Muir valve will improve if she can get  more air flow to the level of her vocal cords I am still worried we may need to involve ENT at some point to directly visualize her vocal cords by direct laryngoscopy, however for now time and observation is the treatment of choice To stop date for 5 days total antibiotic therapy  25 minutes  Erick Colace ACNP-BC [place] Pager # [phone] OR # [phone] if no answer   Independently examined pt, evaluated data & formulated above care plan with NP/resident   Managing secretions, tolerating cuffless #6 Shiley On empiric ceftriaxone , even though culture from 3/24 was negative and no clear infiltrate on chest x-ray Plan would be to downsize to cuffless for next week, hopefully this will also has help with swallow evaluation.  May need inspection of vocal cords in the future  Jodye Scali V. Elsworth Soho MD

## 2020-02-10 NOTE — Progress Notes (Signed)
PT Cancellation Note  Patient Details Name: [name] [name] MRN: [id number] DOB: 02-19-1941   Cancelled Treatment:    Reason Eval/Treat Not Completed: Patient at procedure or test/unavailable Attempted to see pt for PT treatment. RN present and reports pt is leaving for MBS. PT will continue to follow acutely.    Earney Navy, PTA Acute Rehabilitation Services Pager: [phone] Office: [phone]   02/10/2020, 11:22 AM

## 2020-02-10 NOTE — Progress Notes (Signed)
Occupational Therapy Treatment Patient Details Name: [name] [name] MRN: [id number] DOB: 1941-08-12 Today's Date: 02/10/2020    History of present illness Pt is a 79 y.o. female admitted 01/26/20 after cardiac cath showed severe vessel disease; s/p CABG x4 on 3/13. 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 Pt with bilateral PEs. Taken to IR and found to have L M1/MCA occlusion, s/p emergent mechanical thrombectomy. ETT 3/12-3/13 (for procedure), 3/15-3/19. S/p trach 3/19. PMH includes CAD, HTN, THA, osteoporosis.   OT comments  Pt continuing to progress toward stated goals. Focused session on EOB BADL activity with initiation and use of R side for neuro facilitation. Pt able to brush half of hair with RUE and verbal cues. PMV donned during session with encouragement for pt to verbalize. Increased processing time and cues needed to verbalize simple responses. Pt on 5L trach collar with 28% FiO2 with SpO2 stable and increased ability to mobilize secretions. Pt reporting what appears to be diplopia during session. She states that this improves with L eye occluded. Will continue to assess for visual occlusion glasses. D/c recs remain appropriate. Will continue to follow.   Follow Up Recommendations  CIR;Supervision/Assistance - 24 hour    Equipment Recommendations  None recommended by OT    Recommendations for Other Services      Precautions / Restrictions Precautions Precautions: Sternal Precaution Comments: Reviewed sternal precautions       Mobility Bed Mobility Overal bed mobility: Needs Assistance       Supine to sit: Mod assist;+2 for safety/equipment;HOB elevated Sit to supine: +2 for safety/equipment;Mod assist   General bed mobility comments: mod A +2 to assist with legs and trunk to edge of bed and back into bed. Cues to place hands on bed rails to support self  Transfers                       Balance Overall balance assessment: Needs assistance Sitting-balance support: Single extremity supported;Feet supported Sitting balance-Leahy Scale: Fair                                     ADL either performed or assessed with clinical judgement   ADL Overall ADL's : Needs assistance/impaired Eating/Feeding: NPO   Grooming: Minimal assistance;Sitting Grooming Details (indicate cue type and reason): using RUE                               General ADL Comments: pt with fatigue this date after having MBS     Vision   Additional Comments: With PMV in, pt states she is having what appears to be double vision. She reports with her L eye occluded symptoms resolve. Will continue to follow for visual occlusion glasses   Perception     Praxis      Cognition Arousal/Alertness: Lethargic   Overall Cognitive Status: Difficult to assess Area of Impairment: Orientation;Attention;Memory;Following commands;Safety/judgement                 Orientation Level: Place;Time;Situation Current Attention Level: Sustained Memory: Decreased short-term memory Following Commands: Follows one step commands consistently;Follows one step commands with increased time Safety/Judgement: Decreased awareness of safety;Decreased awareness of deficits   Problem Solving: Slow processing;Decreased initiation;Requires verbal cues;Requires tactile cues General Comments: pt following one step  commands with increased time and cueing; difficulty maintaining attention and awareness to tasks without cues. Had PMV donned but difficulty maintaining conversation        Exercises     Shoulder Instructions       General Comments      Pertinent Vitals/ Pain       Pain Assessment: Faces Faces Pain Scale: Hurts a little bit Pain Location: generalized Pain Descriptors / Indicators: Grimacing Pain Intervention(s): Limited activity within patient's tolerance  Home  Living                                          Prior Functioning/Environment              Frequency  Min 3X/week        Progress Toward Goals  OT Goals(current goals can now be found in the care plan section)  Progress towards OT goals: Progressing toward goals  Acute Rehab OT Goals Patient Stated Goal: to return to independence Time For Goal Achievement: 02/16/20 Potential to Achieve Goals: Good  Plan Discharge plan remains appropriate    Co-evaluation    PT/OT/SLP Co-Evaluation/Treatment: Yes     OT goals addressed during session: ADL's and self-care;Strengthening/ROM      AM-PAC OT "6 Clicks" Daily Activity     Outcome Measure   Help from another person eating meals?: Total(NPO) Help from another person taking care of personal grooming?: A Little Help from another person toileting, which includes using toliet, bedpan, or urinal?: A Lot Help from another person bathing (including washing, rinsing, drying)?: A Lot Help from another person to put on and taking off regular upper body clothing?: A Lot Help from another person to put on and taking off regular lower body clothing?: A Lot 6 Click Score: 12    End of Session    OT Visit Diagnosis: Unsteadiness on feet (R26.81);Other abnormalities of gait and mobility (R26.89);Muscle weakness (generalized) (M62.81);Hemiplegia and hemiparesis Hemiplegia - Right/Left: Right Hemiplegia - dominant/non-dominant: Dominant Hemiplegia - caused by: Cerebral infarction   Activity Tolerance Patient limited by fatigue   Patient Left in bed;with call bell/phone within reach;with bed alarm set   Nurse Communication Mobility status        Time: [id number] OT Time Calculation (min): 31 min  Charges: OT General Charges $OT Visit: 1 Visit OT Treatments $Neuromuscular Re-education: 8-22 mins  Zenovia Jarred, MSOT, OTR/L [place] [facility] Office Number: [phone] Pager:  [phone]  Zenovia Jarred 02/10/2020, 4:36 PM

## 2020-02-10 NOTE — Progress Notes (Signed)
Inpatient Rehabilitation-Admissions Coordinator   Followed up with pt's nephew Ozzie Hoyle over the phone. He is unable to provide the support she will need at DC as his family is in the process of relocating [place]. Ozzie Hoyle was able to provide me with some names that he had found on some of her important documents that may be her listed POAs? Pt very somnolent and sleepy when I asked if she recognized any of the names. She indicated she did not want me to call them at this time. Will continue to follow up with pt and see if there is anyone that can assist her at DC.   Raechel Ache, OTR/L  Rehab Admissions Coordinator  [phone] 02/10/2020 12:13 PM

## 2020-02-10 NOTE — Progress Notes (Signed)
11 Days Post-Op Procedure(s) (LRB): IR WITH ANESTHESIA (N/A) Subjective: Awake and appropriately responsive, sitting up in bedside chair.  Remains on 28%trach collar with O2 sats 97%.  TF infusing at 60mg /hr.   BM x 3 yesterday.   Objective: Vital signs in last 24 hours: Temp:  [98.1 F (36.7 C)-99.4 F (37.4 C)] 98.4 F (36.9 C) (03/26 0749) Pulse Rate:  [72-91] 81 (03/26 0749) Cardiac Rhythm: Normal sinus rhythm (03/26 0700) Resp:  [17-28] 17 (03/26 0749) BP: (113-160)/(49-76) 124/62 (03/26 0749) SpO2:  [92 %-98 %] 97 % (03/26 0749) FiO2 (%):  [28 %] 28 % (03/26 0511) Weight:  [65.6 kg] 65.6 kg (03/26 0508)     Intake/Output from previous day: 03/25 0701 - 03/26 0700 In: 100 [IV Piggyback:100] Out: 400 [Urine:400] Intake/Output this shift: No intake/output data recorded.  Physical Exam: General appearance: alert, cooperative and no apparent distress. Neurologic: neuro exam not done today Heart: regular rate and rshythm Lungs: breath sounds are coarse Abdomen: soft and non-tender Extremities: All warm and well perfused. RUE PICC site has a clean dressing. The left arm incision is intact and dry, no m/s deficits in left hand.  Wound: The sternotomy incision is well approximated and dry.   Lab Results: Recent Labs    02/09/20 0358  WBC 14.4*  HGB 9.4*  HCT 29.5*  PLT 426*   BMET:  Recent Labs    02/09/20 0358  NA 137  K 3.7  CL 102  CO2 26  GLUCOSE 151*  BUN 17  CREATININE 0.68  CALCIUM 9.4    PT/INR: No results for input(s): LABPROT, INR in the last 72 hours. ABG    Component Value Date/Time   PHART 7.427 02/02/2020 1735   HCO3 28.5 (H) 02/02/2020 1735   TCO2 30 02/02/2020 1735   ACIDBASEDEF 2.0 01/28/2020 1545   O2SAT 100.0 02/02/2020 1735   CBG (last 3)  Recent Labs    02/10/20 0006 02/10/20 0416 02/10/20 0743  GLUCAP 141* 150* 133*    Assessment/Plan: S/P Procedure(s) (LRB): IR WITH ANESTHESIA (N/A)  -POD11 CABG x 4 with all  arterial grafts for severe MVCAD and 70% left main coronary stenosis. Pre-op EF 35% by echo and 55% by LV-gram at cath. On ASA, atorvastatin, metoprolol, lisinopril, and isosorbide (for radial graft patency). Stable cardiac rhythm.  -Post-op left MCA CVA on POD-3 with subsequent thrombectomy / recanalization of occluded  M1/MCA by IR.  Continue PT/OT/ST.  Evaluated by inpatient rehab team and CIR recommended when medically stable.    -Post-op Seizure- likely secondary to above. On Kepra with no apparent subsequent seizures.  -Bilateral pulmonary emboli post-op- Initially treated with heparin and now on  Lovenox therapy that is being dosed by pharmacy. TEE demonstrated a PFO with L->R shunt, no residual LAA thrombus.   -Post-op respiratory failure- required re-intubation and eventual trach and subsequently has had steady improvement. Currently in no respiratory distress with good air exchange and acceptable O2 sats on 28% trach collar. Lurline Idol was down-sized to #6 cuffless cannula on 3/24. Purulent secretions noted at the site were sent for Cx and final result shows normal resp flora.   -Leukocytosis- Steady increase over past few days. CXR this AM showed stable small effusions, no infiltrates. The UA showed a few WBCs and rare bacteria. Since the tracheal aspirate shows normal resp flora, will down-grade ABX to ceftriaxone per pharmacy recommendations.    -Expected acute blood loss anemia-   Hct trending up, no indication for transfusion.   -History  of thyroid cancer, s/p thyroidectomy- on appropriate replacement therapy.     LOS: 15 days    Antony Odea, [name] [phone] 02/10/2020

## 2020-02-10 NOTE — Progress Notes (Signed)
Modified Barium Swallow Progress Note  Patient Details  Name: [name] [name] MRN: [id number] Date of Birth: [birth year]  Today's Date: 02/10/2020  Modified Barium Swallow completed.  Full report located under Chart Review in the Imaging Section.  Brief recommendations include the following:  Clinical Impression  Pt presents with a significant dysphagia with consistent inability to close laryngeal vestibule and protect airway from aspiration.  There are a multitude of impairments: poor tongue control and reduced propulsion into pharynx; limited mobility of the hyolarynx with poor epiglottic inversion; decreased pharyngeal stripping.  Nectars were immediately aspirated before the swallow could be initiated; thin liquids and purees spilled into larynx and either sat at level of vocal cords or traversed just below.  Pt coughed throughout study, expelling PMV from trach tube frequently.  There was significant residue throughout the pharynx.  At this time, it remains difficult to discern primary etiology of dysphagia - length of oral intubation, presence of trach and impact on sensation, and neurological deficits are all impacting swallow function to some degree.  For now continue NPO; continue cortrak.  SLP will follow for therapeutic exercises.  Potential for recovery of swallow may be better known in the next 5-7 days.  Will need to repeat MBS prior to making decisions about long-term nutrition.    Swallow Evaluation Recommendations       SLP Diet Recommendations: NPO;Alternative means - temporary                       Oral Care Recommendations: Oral care QID       [name] [name], [place] Office number [phone] Pager [phone]  [name] [name] 02/10/2020,2:22 PM

## 2020-02-10 NOTE — Progress Notes (Signed)
Physical Therapy Treatment Patient Details Name: [name] [name] MRN: [id number] DOB: November 20, 1940 Today's Date: 02/10/2020    History of Present Illness Pt is a 79 y.o. female admitted 01/26/20 after cardiac cath showed severe vessel disease; s/p CABG x4 on 3/13. 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 Pt with bilateral PEs. Taken to IR and found to have L M1/MCA occlusion, s/p emergent mechanical thrombectomy. ETT 3/12-3/13 (for procedure), 3/15-3/19. S/p trach 3/19. PMH includes CAD, HTN, THA, osteoporosis.    PT Comments    Patient seen for PT treatment. Pt in bed upon arrival and agreeable to participate in therapy. Pt requires +2 assist for mobility this session. Continue to recommend CIR for further skilled PT services to maximize independence and safety with mobility.     Follow Up Recommendations  CIR;Supervision/Assistance - 24 hour     Equipment Recommendations  Wheelchair cushion (measurements PT);Wheelchair (measurements PT);3in1 (PT)    Recommendations for Other Services       Precautions / Restrictions Precautions Precautions: Sternal Precaution Comments: Reviewed sternal precautions    Mobility  Bed Mobility Overal bed mobility: Needs Assistance Bed Mobility: Supine to Sit;Sit to Supine     Supine to sit: Mod assist;+2 for safety/equipment;HOB elevated Sit to supine: +2 for safety/equipment;Mod assist   General bed mobility comments: mod A +2 to assist with legs and trunk to edge of bed and back into bed; cues for sequencing   Transfers                 General transfer comment: pt declined transfer due to fatigue  Ambulation/Gait                 Stairs             Wheelchair Mobility    Modified Rankin (Stroke Patients Only) Modified Rankin (Stroke Patients Only) Pre-Morbid Rankin Score: No symptoms Modified Rankin: Moderately severe  disability     Balance Overall balance assessment: Needs assistance Sitting-balance support: Single extremity supported;Feet supported Sitting balance-Leahy Scale: Fair                                      Cognition Arousal/Alertness: Actor when engaging in mobility tasks) Behavior During Therapy: WFL for tasks assessed/performed Overall Cognitive Status: Difficult to assess Area of Impairment: Orientation;Attention;Memory;Following commands;Safety/judgement                 Orientation Level: Place;Time;Situation Current Attention Level: Sustained Memory: Decreased short-term memory Following Commands: Follows one step commands consistently;Follows one step commands with increased time Safety/Judgement: Decreased awareness of safety;Decreased awareness of deficits   Problem Solving: Slow processing;Decreased initiation;Requires verbal cues;Requires tactile cues General Comments: pt following one step commands with increased time and cueing; difficulty maintaining attention and awareness to tasks without cues. Had PMV donned but difficulty maintaining conversation      Exercises      General Comments        Pertinent Vitals/Pain Pain Assessment: Faces Faces Pain Scale: Hurts a little bit Pain Location: generalized Pain Descriptors / Indicators: Grimacing Pain Intervention(s): Limited activity within patient's tolerance    Home Living                      Prior Function            PT Goals (  current goals can now be found in the care plan section) Acute Rehab PT Goals Patient Stated Goal: to return to independence Progress towards PT goals: Progressing toward goals    Frequency    Min 4X/week      PT Plan Current plan remains appropriate    Co-evaluation PT/OT/SLP Co-Evaluation/Treatment: Yes Reason for Co-Treatment: For patient/therapist safety;To address functional/ADL transfers;Necessary to address  cognition/behavior during functional activity PT goals addressed during session: Mobility/safety with mobility OT goals addressed during session: ADL's and self-care;Strengthening/ROM      AM-PAC PT "6 Clicks" Mobility   Outcome Measure  Help needed turning from your back to your side while in a flat bed without using bedrails?: A Lot Help needed moving from lying on your back to sitting on the side of a flat bed without using bedrails?: A Lot Help needed moving to and from a bed to a chair (including a wheelchair)?: A Lot Help needed standing up from a chair using your arms (e.g., wheelchair or bedside chair)?: A Lot Help needed to walk in hospital room?: A Lot Help needed climbing 3-5 steps with a railing? : Total 6 Click Score: 11    End of Session Equipment Utilized During Treatment: Oxygen Activity Tolerance: Patient tolerated treatment well Patient left: with call bell/phone within reach;in bed;with bed alarm set Nurse Communication: Mobility status PT Visit Diagnosis: Other abnormalities of gait and mobility (R26.89);Hemiplegia and hemiparesis Hemiplegia - Right/Left: Right Hemiplegia - dominant/non-dominant: Dominant Hemiplegia - caused by: Cerebral infarction     Time: [id number] PT Time Calculation (min) (ACUTE ONLY): 31 min  Charges:  $Therapeutic Activity: 8-22 mins                     Earney Navy, PTA Acute Rehabilitation Services Pager: [phone] Office: [phone]     Darliss Cheney 02/10/2020, 5:07 PM

## 2020-02-11 LAB — BASIC METABOLIC PANEL
Anion gap: 8 (ref 5–15)
BUN: 17 mg/dL (ref 8–23)
CO2: 26 mmol/L (ref 22–32)
Calcium: 9.3 mg/dL (ref 8.9–10.3)
Chloride: 103 mmol/L (ref 98–111)
Creatinine, Ser: 0.71 mg/dL (ref 0.44–1.00)
GFR calc Af Amer: >60 mL/min (ref >60)
GFR calc non Af Amer: >60 mL/min (ref >60)
Glucose, Bld: 136 mg/dL — ABNORMAL HIGH (ref 70–99)
Potassium: 4.2 mmol/L (ref 3.5–5.1)
Sodium: 137 mmol/L (ref 135–145)

## 2020-02-11 LAB — CBC
HCT: 25.9 % — ABNORMAL LOW (ref 36.0–46.0)
Hemoglobin: 8.1 g/dL — ABNORMAL LOW (ref 12.0–15.0)
MCH: 29 pg (ref 26.0–34.0)
MCHC: 31.3 g/dL (ref 30.0–36.0)
MCV: 92.8 fL (ref 80.0–100.0)
Platelets: 425 10*3/uL — ABNORMAL HIGH (ref 150–400)
RBC: 2.79 MIL/uL — ABNORMAL LOW (ref 3.87–5.11)
RDW: 14.8 % (ref 11.5–15.5)
WBC: 9.8 10*3/uL (ref 4.0–10.5)
nRBC: 0 % (ref 0.0–0.2)

## 2020-02-11 LAB — GLUCOSE, CAPILLARY
Glucose-Capillary: 145 mg/dL — ABNORMAL HIGH (ref 70–99)
Glucose-Capillary: 125 mg/dL — ABNORMAL HIGH (ref 70–99)
Glucose-Capillary: 119 mg/dL — ABNORMAL HIGH (ref 70–99)
Glucose-Capillary: 111 mg/dL — ABNORMAL HIGH (ref 70–99)
Glucose-Capillary: 130 mg/dL — ABNORMAL HIGH (ref 70–99)
Glucose-Capillary: 142 mg/dL — ABNORMAL HIGH (ref 70–99)

## 2020-02-11 MED ORDER — MAGIC MOUTHWASH
5.0000 mL | Freq: Four times a day (QID) | ORAL | Status: DC | PRN
  Administered 2020-02-11: 5 mL via ORAL
  Filled 2020-02-11 (×2): qty 5

## 2020-02-11 NOTE — Progress Notes (Addendum)
[address]       [place] [address]             [phone]      12 Days Post-Op Procedure(s) (LRB): IR WITH ANESTHESIA (N/A) Subjective: Feels okay. Bottom is sore-no breakdown when examined this morning.   Objective: Vital signs in last 24 hours: Temp:  [97.4 F (36.3 C)-98.4 F (36.9 C)] 97.4 F (36.3 C) (03/27 0329) Pulse Rate:  [63-84] 73 (03/27 0738) Cardiac Rhythm: Normal sinus rhythm (03/27 0700) Resp:  [10-28] 19 (03/27 0738) BP: (100-125)/(47-62) 125/54 (03/27 0329) SpO2:  [96 %-100 %] 96 % (03/27 0738) FiO2 (%):  [28 %] 28 % (03/27 0738) Weight:  [66.1 kg] 66.1 kg (03/27 0425)     Intake/Output from previous day: 03/26 0701 - 03/27 0700 In: 810 [I.V.:10; NG/GT:800] Out: 400 [Urine:400] Intake/Output this shift: No intake/output data recorded.  General appearance: alert, cooperative and no distress Heart: regular rate and rhythm, S1, S2 normal, no murmur, click, rub or gallop Lungs: harsh breath sounds.  Abdomen: soft, non-tender; bowel sounds normal; no masses,  no organomegaly Extremities: extremities normal, atraumatic, no cyanosis or edema Wound: clean and dry  Lab Results: Recent Labs    02/10/20 0922 02/11/20 0422  WBC 12.2* 9.8  HGB 9.2* 8.1*  HCT 28.7* 25.9*  PLT 474* 425*   BMET:  Recent Labs    02/10/20 0922 02/11/20 0422  NA 138 137  K 3.7 4.2  CL 101 103  CO2 25 26  GLUCOSE 154* 136*  BUN 15 17  CREATININE 0.86 0.71  CALCIUM 9.1 9.3    PT/INR: No results for input(s): LABPROT, INR in the last 72 hours. 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 Pre-op EF 35% by echo and 55% by LV-gram at cath. On ASA, atorvastatin, metoprolol, lisinopril, and isosorbide (for radial graft patency). Stable cardiac rhythm.  1. 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 Continue PT/OT/ST. Evaluated by inpatient rehab team and CIR recommended when medically stable.   2. Post-op Seizure- likely secondary to above. On Kepra with no apparent subsequent seizures.  3. Bilateral pulmonary emboli post-op- Initially treated with heparin and now on Lovenox therapy that is being dosed by pharmacy. TEE demonstrated a PFO with L->R shunt, no residual LAA thrombus.   4. Post-op respiratory failure- required re-intubation and eventual trach and subsequently has had steady improvement. Currently in no respiratory distress with good air exchange and acceptable O2 sats on 28% trach collar. Lurline Idol was down-sized to #6 cuffless cannula on 3/24. Purulent secretions noted at the site were sent for Cx and final result shows normal resp flora. Continue Rocephin for 7 days.   5. Leukocytosis-down to 9.8 today.    6. Expected acute blood loss anemia- 8.1/25.9 today  7. History of thyroid cancer, s/p thyroidectomy- on appropriate replacement therapy  Plan: CIR next week. Will order magic mouth wash for thrush. OOB to the chair  this morning. Continue PT/OT/SLP. Failed swallow, will remain NPO.    LOS: 16 days    [name] 02/11/2020   I have seen and examined the patient and agree with the assessment and plan as outlined.  Rexene Alberts, MD 02/11/2020

## 2020-02-11 NOTE — Progress Notes (Signed)
Pt walked 60 ft. With front wheel walker tolerated well

## 2020-02-12 LAB — GLUCOSE, CAPILLARY
Glucose-Capillary: 105 mg/dL — ABNORMAL HIGH (ref 70–99)
Glucose-Capillary: 115 mg/dL — ABNORMAL HIGH (ref 70–99)
Glucose-Capillary: 117 mg/dL — ABNORMAL HIGH (ref 70–99)
Glucose-Capillary: 127 mg/dL — ABNORMAL HIGH (ref 70–99)
Glucose-Capillary: 131 mg/dL — ABNORMAL HIGH (ref 70–99)
Glucose-Capillary: 132 mg/dL — ABNORMAL HIGH (ref 70–99)

## 2020-02-12 NOTE — Progress Notes (Signed)
ANTICOAGULATION CONSULT NOTE - [facility] for Heparin to enoxaparin Indication: pulmonary embolus, stroke and DVT, and recent CABG  Allergies  Allergen Reactions  . Bee Venom Swelling    Massive swelling  . Penicillins Other (See Comments)    ** tolerates Zosyn + cephalosporins Did it involve swelling of the face/tongue/throat, SOB, or low BP? Unknown Did it involve sudden or severe rash/hives, skin peeling, or any reaction on the inside of your mouth or nose? Unknown Did you need to seek medical attention at a hospital or doctor's office? Unknown When did it last happen?1945 If all above answers are "NO", may proceed with cephalosporin use.  . Adhesive [Tape] Rash  . Morphine And Related Rash  . Sulfa Antibiotics Nausea And Vomiting and Rash    Patient Measurements: Height: 5\' 3"  (160 cm) Weight: 146 lb 6.2 oz (66.4 kg) IBW/kg (Calculated) : 52.4  Vital Signs: Temp: 98.4 F (36.9 C) (03/28 0740) Temp Source: Oral (03/28 0740) BP: 131/52 (03/28 0740) Pulse Rate: 83 (03/28 1108)  Labs: Recent Labs    02/10/20 0922 02/11/20 0422  HGB 9.2* 8.1*  HCT 28.7* 25.9*  PLT 474* 425*  CREATININE 0.86 0.71    Estimated Creatinine Clearance: 54.8 mL/min (by C-G formula based on SCr of 0.71 mg/dL).  Medications:  Plavix is being held as changing to full anticoagulation with IV Heparin  Assessment: 79 year old female admitted with angina on 3/11 and s/p CABG on 3/12 who had acute stroke symptoms on 3/15 AM. CTA of Head and Neck showed an acute MCA infarct and bilateral subsegmental acute pulmonary emboli. She is now s/p IR guided mechanical thrombectomy and Pharmacy has been consulted for enoxaparin from heparin dosing on 3/22. 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   HIT antibody negative, lupus anticoagulant screen negative, anti-Jo antibody neg, extractable nuclear antigen neg.   Patient has been on therapeutic enoxaparin for 7 days. Renal function has  been stable with a creatinine around 0.6-0.86, estimated CrCl is 55 ml/min. H&H has been stable as well between 8-9 and 26-28. plts are elevated > 400. Per nursing, no bleeding or issues noted. Okay to continue without checking levels given patient is not pregnant, obese, or experiencing renal insufficiency and does not have a mechanical heart valve.  Goal of Therapy:  Anti-Xa level 0.6-1 units/ml 4hrs after LMWH dose given if levels checked Monitor platelets by anticoagulation protocol: Yes   Plan:  Continue enoxaparin 1mg /kg (65mg ) SQ every 12 hours Monitor renal function, Wt, CBC and s/s bleeding Follow up plans for oral anticoagulation   Thank you,   Eddie Candle, PharmD PGY-1 Pharmacy Resident   Please check amion for clinical pharmacist contact number 02/12/2020 11:53 AM

## 2020-02-12 NOTE — Plan of Care (Signed)
  Problem: Education: Goal: Knowledge of General Education information will improve Description: Including pain rating scale, medication(s)/side effects and non-pharmacologic comfort measures Outcome: Progressing   Problem: Health Behavior/Discharge Planning: Goal: Ability to manage health-related needs will improve Outcome: Progressing   Problem: Clinical Measurements: Goal: Ability to maintain clinical measurements within normal limits will improve Outcome: Progressing Goal: Will remain free from infection Outcome: Progressing Goal: Diagnostic test results will improve Outcome: Progressing Goal: Respiratory complications will improve Outcome: Progressing Goal: Cardiovascular complication will be avoided Outcome: Progressing   Problem: Activity: Goal: Risk for activity intolerance will decrease Outcome: Progressing   Problem: Nutrition: Goal: Adequate nutrition will be maintained Outcome: Progressing   Problem: Coping: Goal: Level of anxiety will decrease Outcome: Progressing   Problem: Elimination: Goal: Will not experience complications related to bowel motility Outcome: Progressing Goal: Will not experience complications related to urinary retention Outcome: Progressing   Problem: Pain Managment: Goal: General experience of comfort will improve Outcome: Progressing   Problem: Safety: Goal: Ability to remain free from injury will improve Outcome: Progressing   Problem: Skin Integrity: Goal: Risk for impaired skin integrity will decrease Outcome: Progressing   Problem: Education: Goal: Will demonstrate proper wound care and an understanding of methods to prevent future damage Outcome: Progressing Goal: Knowledge of disease or condition will improve Outcome: Progressing Goal: Knowledge of the prescribed therapeutic regimen will improve Outcome: Progressing Goal: Individualized Educational Video(s) Outcome: Progressing   Problem: Activity: Goal: Risk for  activity intolerance will decrease Outcome: Progressing   Problem: Cardiac: Goal: Will achieve and/or maintain hemodynamic stability Outcome: Progressing   Problem: Clinical Measurements: Goal: Postoperative complications will be avoided or minimized Outcome: Progressing   Problem: Respiratory: Goal: Respiratory status will improve Outcome: Progressing   Problem: Skin Integrity: Goal: Wound healing without signs and symptoms of infection Outcome: Progressing Goal: Risk for impaired skin integrity will decrease Outcome: Progressing   Problem: Urinary Elimination: Goal: Ability to achieve and maintain adequate renal perfusion and functioning will improve Outcome: Progressing   Problem: Education: Goal: Knowledge of secondary prevention will improve Outcome: Progressing Goal: Knowledge of patient specific risk factors addressed and post discharge goals established will improve Outcome: Progressing Goal: Individualized Educational Video(s) Outcome: Progressing

## 2020-02-12 NOTE — Progress Notes (Addendum)
[address]       [place] [address]             [phone]      13 Days Post-Op Procedure(s) (LRB): IR WITH ANESTHESIA (N/A) Subjective: Feels okay this morning. She was up in the chair for most of the day yesterday.   Objective: Vital signs in last 24 hours: Temp:  [97.7 F (36.5 C)-98.4 F (36.9 C)] 98.2 F (36.8 C) (03/28 0426) Pulse Rate:  [66-90] 67 (03/28 0426) Cardiac Rhythm: Normal sinus rhythm (03/27 2010) Resp:  [15-30] 20 (03/28 0426) BP: (105-121)/(51-57) 115/51 (03/28 0426) SpO2:  [96 %-100 %] 100 % (03/28 0426) FiO2 (%):  [28 %] 28 % (03/28 0426) Weight:  [66.4 kg] 66.4 kg (03/28 0630)   Intake/Output from previous day: 03/27 0701 - 03/28 0700 In: 1330 [NG/GT:1330] Out: -  Intake/Output this shift: No intake/output data recorded.  General appearance: alert, cooperative and no distress Heart: regular rate and rhythm, S1, S2 normal, no murmur, click, rub or gallop Lungs: harsh sounds, congested Abdomen: soft, non-tender; bowel sounds normal; no masses,  no organomegaly Extremities: 1+ pitting lower ext edema Wound: clean and dry  Lab Results: Recent Labs    02/10/20 0922 02/11/20 0422  WBC 12.2* 9.8  HGB 9.2* 8.1*  HCT 28.7* 25.9*  PLT 474* 425*   BMET:  Recent Labs    02/10/20 0922 02/11/20 0422  NA 138 137  K 3.7 4.2  CL 101 103  CO2 25 26  GLUCOSE 154* 136*  BUN 15 17  CREATININE 0.86 0.71  CALCIUM 9.1 9.3    PT/INR: No results for input(s): LABPROT, INR in the last 72 hours. ABG    Component Value Date/Time   PHART 7.427 02/02/2020 1735   HCO3 28.5 (H) 02/02/2020 1735   TCO2 30 02/02/2020 1735   ACIDBASEDEF 2.0 01/28/2020 1545   O2SAT 100.0 02/02/2020 1735   CBG (last 3)  Recent Labs    02/11/20 2033 02/11/20 2323 02/12/20 0425  GLUCAP 130* 142* 127*    Assessment/Plan: S/P Procedure(s) (LRB): IR WITH ANESTHESIA (N/A)  POD13CABG x 4 with all arterial grafts for severe MVCAD and 70% left  main coronary stenosis. Pre-op EF 35% by echo and 55% by LV-gram at cath. On ASA, atorvastatin, metoprolol, lisinopril, and isosorbide (for radial graft patency). Stable cardiac rhythm.  1. 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 Continue PT/OT/ST. Evaluated by inpatient rehab team and CIR recommended when medically stable.   2. Post-op Seizure- likely secondary to above. On Kepra with no apparent subsequent seizures.  3. Bilateral pulmonary emboli post-op- Initially treated with heparin and now on Lovenox therapy that is being dosed by pharmacy. TEE demonstrated a PFO with L->R shunt, no residual LAA thrombus.   4. Post-op respiratory failure- required re-intubation and eventual trach and subsequently has had steady improvement. Currently in no respiratory distress with good air exchange and acceptable O2 sats on 28% trach collar. Lurline Idol was down-sized to #6 cuffless cannulaon 3/24. Secretions cultured and showed resp flora.Continue Rocephin for 7 days.   5. Leukocytosis-down to 9.8 yesterday.    6. Expected acute blood loss anemia- 8.1/25.9 yesterday  7. History of thyroid cancer, s/p thyroidectomy- on appropriate replacement therapy  Plan: CIR next week. On Lovenox therapy for PE-still has EPW in place. Will discuss timing of removal with Dr. Roxy Manns. Continue magic mouth wash for thrush. OOB to  the chair this morning. Continue PT/OT/SLP. Failed swallow, will remain NPO.    LOS: 17 days    [name] 02/12/2020   I have seen and examined the patient and agree with the assessment and plan as outlined.  Rexene Alberts, MD 02/12/2020 1:39 PM

## 2020-02-13 ENCOUNTER — Ambulatory Visit: Payer: PPO | Admitting: Cardiothoracic Surgery

## 2020-02-13 LAB — GLUCOSE, CAPILLARY
Glucose-Capillary: 127 mg/dL — ABNORMAL HIGH (ref 70–99)
Glucose-Capillary: 117 mg/dL — ABNORMAL HIGH (ref 70–99)
Glucose-Capillary: 99 mg/dL (ref 70–99)
Glucose-Capillary: 111 mg/dL — ABNORMAL HIGH (ref 70–99)
Glucose-Capillary: 128 mg/dL — ABNORMAL HIGH (ref 70–99)

## 2020-02-13 NOTE — Progress Notes (Signed)
PT Cancellation Note  Patient Details Name: [name] [name] MRN: [id number] DOB: 04/13/1941   Cancelled Treatment:    Reason Eval/Treat Not Completed: Other (comment).  SLP working with pt.  OT saw earlier today.  I read that CIR denied her admission, so I am updating recs to SNF.   Thanks,  Verdene Lennert, PT, DPT  Acute Rehabilitation [phone] pager #[phone] office       Barbarann Ehlers Larken Urias 02/13/2020, 4:48 PM

## 2020-02-13 NOTE — Care Management Important Message (Signed)
Important Message  Patient Details  Name: [name] [name] MRN: [id number] Date of Birth: 1941/07/22   Medicare Important Message Given:  Yes     [name] 02/13/2020, 10:14 AM

## 2020-02-13 NOTE — Progress Notes (Addendum)
Nutrition Follow-up  DOCUMENTATION CODES:   Not applicable  INTERVENTION:   Tube Feeding via Cortrak:  Continue Osmolite 1.2 at 60 ml/hr Provides 1728 kcals, 80 g of protein and 1166 mL of free water Meets 100% of estimated calorie and protein needs  Await diet advancement; further recommendations to follow  NUTRITION DIAGNOSIS:   Inadequate oral intake related to inability to eat as evidenced by NPO status.  Being addressed via TF   GOAL:   Patient will meet greater than or equal to 90% of their needs  Progressing  MONITOR:   Vent status, I & O's  REASON FOR ASSESSMENT:   Ventilator    ASSESSMENT:   Pt with PMH of HTN, HLD, and thyroid cancer admitted 3/11 for chest pain found to have 4-vessel CAD and underwent CABG 3/13.   3/11 Admitted 3/13 CABG x4 3/15 L MCA infarct with left M1 occlusion s/p thromecbtomy, Intubated 3/16 Seizures, started on Keppra 3/17 Cortrak placed 3/19 Trach placed 3/20 Trach collar 3/24- changed to cuffless  Tolerating PMV. Secretions improving. Remains NPO and continues to work with SLP. Tolerating tube feeding at goal rate. Plan to wean tube feeding once diet advanced and provide supplements.   Admission weight: 65.3 kg  Current weight: 66.3 kg   I/O: -5,198 ml since 3/15  UOP: 500 ml x 24 hrs   Medications: dulcolax, colace, SS novolog  Labs: CBG 111-145  Diet Order:   Diet Order    None      EDUCATION NEEDS:   No education needs have been identified at this time  Skin:  Skin Assessment: Skin Integrity Issues: Skin Integrity Issues:: Incisions Incisions: L hand, chest  Last BM:  3/26  Height:   Ht Readings from Last 1 Encounters:  02/08/20 5\' 3"  (1.6 m)    Weight:   Wt Readings from Last 1 Encounters:  02/13/20 66.3 kg    Ideal Body Weight:  52.2 kg  BMI:  Body mass index is 25.89 kg/m.  Estimated Nutritional Needs:   Kcal:  1650-1850 kcals  Protein:  80-90g  Fluid:  >/= 1.7 L   Mariana Single RD, LDN Clinical Nutrition Pager listed in [place]

## 2020-02-13 NOTE — Progress Notes (Addendum)
Occupational Therapy Treatment Patient Details Name: [name] [name] MRN: [id number] DOB: 16-Oct-1941 Today's Date: 02/13/2020    History of present illness Pt is a 79 y.o. female admitted 01/26/20 after cardiac cath showed severe vessel disease; s/p CABG x4 on 3/13. 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 Pt with bilateral PEs. Taken to IR and found to have L M1/MCA occlusion, s/p emergent mechanical thrombectomy. ETT 3/12-3/13 (for procedure), 3/15-3/19. S/p trach 3/19. PMH includes CAD, HTN, THA, osteoporosis.   OT comments  Pt making steady progress toward goals. Wearing PMSV throughout session, communicating with OT, however having difficulty with word finding. RUE strength and coordination improving - although weak and difficulty with in-hand manipulation skills, using functionally during ADL tasks. Transferred to BSC<> bed with min A. Required VC throughout mobility and ADL to maintain sternal precautions. Continue to recommend rehab at [facility]. Will follow acutely.   Follow Up Recommendations  SNF (CIR denied);Supervision/Assistance - 24 hour    Equipment Recommendations  Other (comment)(TBA)    Recommendations for Other Services Rehab consult    Precautions / Restrictions Precautions Precautions: Sternal Precaution Comments: PMSV; trach; coretrack       Mobility Bed Mobility Overal bed mobility: Needs Assistance         Sit to supine: Min assist;HOB elevated      Transfers Overall transfer level: Needs assistance   Transfers: Sit to/from Stand;Stand Pivot Transfers Sit to Stand: Min assist Stand pivot transfers: Min assist       General transfer comment: VC for sternal precautions    Balance     Sitting balance-Leahy Scale: Good       Standing balance-Leahy Scale: Poor                             ADL either performed or assessed with clinical judgement    ADL Overall ADL's : Needs assistance/impaired     Grooming: Minimal assistance;Sitting                   Toilet Transfer: Minimal assistance;Stand-pivot;Cueing for safety   Toileting- Clothing Manipulation and Hygiene: Minimal assistance;Sit to/from stand(vc to adhere to sternal precautions)       Functional mobility during ADLs: Minimal assistance;Cueing for safety       Vision   Additional Comments: Does not report double vision this session; attempting to communicate history of visual problems that she has had prior to CVA. will further assess   Perception     Praxis      Cognition Arousal/Alertness: Awake/alert Behavior During Therapy: Flat affect Overall Cognitive Status: Difficult to assess Area of Impairment: Memory;Safety/judgement;Awareness                   Current Attention Level: Selective Memory: Decreased recall of precautions;Decreased short-term memory Following Commands: Follows one step commands consistently Safety/Judgement: Decreased awareness of safety;Decreased awareness of deficits Awareness: Emergent Problem Solving: Slow processing General Comments: Multiple VC to adhere to sternal precautions        Exercises Exercises: General Upper Extremity General Exercises - Upper Extremity Shoulder Flexion: AROM;5 reps;Supine(keeping arm adducted) Elbow Flexion: AROM;Right;10 reps;Supine Elbow Extension: AROM;Right;10 reps;Supine Digit Composite Flexion: AROM;Strengthening;Right;10 reps;Supine Composite Extension: AROM;Strengthening;Right;10 reps;Supine   Shoulder Instructions       General Comments   AROM AFL. @ 3+/5 throughout; decreased grip and pinch strength; decreased in-hand manipulations skills;"clumsy hand "  pt states she "drops things". Would benefit form putty/coordination HEP    Pertinent Vitals/ Pain       Pain Assessment: No/denies pain  Home Living                                           Prior Functioning/Environment              Frequency  Min 3X/week        Progress Toward Goals  OT Goals(current goals can now be found in the care plan section)  Progress towards OT goals: Progressing toward goals;OT to reassess next treatment  Acute Rehab OT Goals Patient Stated Goal: to return to independence OT Goal Formulation: With patient Time For Goal Achievement: 02/16/20 Potential to Achieve Goals: Good ADL Goals Pt Will Perform Grooming: with min assist;standing Pt Will Transfer to Toilet: with min assist;stand pivot transfer;bedside commode Pt/caregiver will Perform Home Exercise Program: Increased strength;Right Upper extremity;With written HEP provided;With Supervision Additional ADL Goal #1: Pt will complete BADL task with min A and <3 VC's for problem solving Additional ADL Goal #2: Pt will verbalize sternal precautions with <3 VC's while completing BADL task Additional ADL Goal #3: Pt will apply visual compensatory strategies to BADL with min A for increased safety in functional tasks  Plan Discharge plan remains appropriate    Co-evaluation                 AM-PAC OT "6 Clicks" Daily Activity     Outcome Measure   Help from another person eating meals?: Total Help from another person taking care of personal grooming?: A Little Help from another person toileting, which includes using toliet, bedpan, or urinal?: A Little Help from another person bathing (including washing, rinsing, drying)?: A Lot Help from another person to put on and taking off regular upper body clothing?: A Lot Help from another person to put on and taking off regular lower body clothing?: A Lot 6 Click Score: 13    End of Session Equipment Utilized During Treatment: Other (comment)(PMSV)  OT Visit Diagnosis: Unsteadiness on feet (R26.81);Other abnormalities of gait and mobility (R26.89);Muscle weakness (generalized) (M62.81);Hemiplegia and hemiparesis Hemiplegia -  Right/Left: Right Hemiplegia - dominant/non-dominant: Dominant Hemiplegia - caused by: Cerebral infarction   Activity Tolerance Patient tolerated treatment well   Patient Left in bed;with call bell/phone within reach;with bed alarm set   Nurse Communication Mobility status        Time: [id number] OT Time Calculation (min): 26 min  Charges: OT General Charges $OT Visit: 1 Visit OT Treatments $Self Care/Home Management : 23-37 mins  Maurie Boettcher, OT/L   Acute OT Clinical Specialist [place] Pager [phone] Office [phone]    [facility] 02/13/2020, 10:36 AM

## 2020-02-13 NOTE — Progress Notes (Signed)
  Speech Language Pathology Treatment: Dysphagia;Cognitive-Linquistic  Patient Details Name: [name] [name] MRN: [id number] DOB: 1940/12/28 Today's Date: 02/13/2020 Time: [id number] SLP Time Calculation (min) (ACUTE ONLY): 30 min  Assessment / Plan / Recommendation Clinical Impression  Pt's overall interaction and spontaneity was much improved since last seen by SLP on 3/26 for MBS.  PMV in place -pt's voice and respiratory pacing with valve in place WNL.  Fluency of expressions is improved, allowing better assessment of aphasia.  Pt continues to demonstrate deficits in word-retrieval and paraphasias, with emerging recognition and attempts to self-correct.  She follows simple, predictable commands with 80% accuracy; novel commands were more difficult to execute.  For example, instructions to complete Masako maneuver to improve swallowing were difficulty to follow due to apraxia.  Unable to complete despite verbal/visual cues.  Session focused on use of ice chips to facilitate a swallow - when cues were provided for effortful swallow, pt verbalized misunderstanding of instructions.  She was able to follow through when verbal discussion was kept to a minimum.  There were no obvious s/s of aspiration; however, MBS revealed unreliable cough response.   Hearing impairment is also impacting overall function - batteries are dead in left HA; will search for 312 style battery.  Recommend allowing occasional ice chips after oral care.  Pt verbalized pleasure at being able to eat something. D/W RN and posted sign at [facility].   SLP will continue to follow for aphasia, swallowing, and PMV use.   HPI HPI:  79 y.o. female admitted 01/26/20 after cardiac cath showed severe vessel disease; s/p CABG x4 on 3/13. 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 Pt with bilateral PEs. Taken to IR and found to have L M1/MCA occlusion, s/p emergent  mechanical thrombectomy. ETT 3/12-3/13 (for procedure), 3/15-3/19. S/p trach 3/19. PMH includes CAD, HTN, THA, osteoporosis.  MBS 3/26 with severe dysphagia and aspiration.        SLP Plan  Continue with current plan of care       Recommendations  Diet recommendations: NPO(allow ice chips occasionally throughout day) Medication Administration: Via alternative means      Patient may use Passy-Muir Speech Valve: Intermittently with supervision         Oral Care Recommendations: Oral care QID Plan: Continue with current plan of care       GO                [name] Laurice 02/13/2020, 4:24 PM  Estill Bamberg L. Tivis Ringer, [place] Office number [phone] Pager [phone]

## 2020-02-13 NOTE — Progress Notes (Signed)
14 Days Post-Op Procedure(s) (LRB): IR WITH ANESTHESIA (N/A) Subjective: Awake and alert and more talkative but frustrated with finding words.  Now has PM valve in place and is on RA with O2 sats 98-100%. TF infusing at 60/hr per [employer]. No new events over the weekend.   Objective: Vital signs in last 24 hours: Temp:  [98 F (36.7 C)-98.3 F (36.8 C)] 98.2 F (36.8 C) (03/29 0749) Pulse Rate:  [64-83] 74 (03/29 0749) Cardiac Rhythm: Normal sinus rhythm (03/29 0700) Resp:  [18-21] 18 (03/29 0749) BP: (105-122)/(49-85) 116/52 (03/29 0749) SpO2:  [94 %-100 %] 100 % (03/29 0749) FiO2 (%):  [21 %-28 %] 28 % (03/28 2348) Weight:  [66.3 kg] 66.3 kg (03/29 0426)     Intake/Output from previous day: 03/28 0701 - 03/29 0700 In: 1420 [NG/GT:1320; IV Piggyback:100] Out: 1050 [Urine:500; Emesis/NG output:550] Intake/Output this shift: No intake/output data recorded.  General appearance: alert, cooperative and mild distress Neurologic: expressive dysphasia. Per therapy notes, still has significant generalized weakness and imbalance but is progressing toward goals.  Heart: NSR.  Lungs: clear to auscultation bilaterally Abdomen: soft and non-tender. Extremities: all warm and well perfused. Left arm incision is intact and dry.   Wound: the sternal incision is intact and dry.   Lab Results: Recent Labs    02/10/20 0922 02/11/20 0422  WBC 12.2* 9.8  HGB 9.2* 8.1*  HCT 28.7* 25.9*  PLT 474* 425*   BMET:  Recent Labs    02/10/20 0922 02/11/20 0422  NA 138 137  K 3.7 4.2  CL 101 103  CO2 25 26  GLUCOSE 154* 136*  BUN 15 17  CREATININE 0.86 0.71  CALCIUM 9.1 9.3    PT/INR: No results for input(s): LABPROT, INR in the last 72 hours. ABG    Component Value Date/Time   PHART 7.427 02/02/2020 1735   HCO3 28.5 (H) 02/02/2020 1735   TCO2 30 02/02/2020 1735   ACIDBASEDEF 2.0 01/28/2020 1545   O2SAT 100.0 02/02/2020 1735   CBG (last 3)  Recent Labs    02/12/20 2346  02/13/20 0425 02/13/20 0807  GLUCAP 132* 127* 117*    Assessment/Plan: S/P Procedure(s) (LRB): IR WITH ANESTHESIA (N/A)  POD14CABG x 4 with all arterial grafts for severe MVCAD and 70% left main coronary stenosis. Pre-op EF 35% by echo and 55% by LV-gram at cath. On ASA, atorvastatin, metoprolol, lisinopril, and isosorbide (for radial graft patency). Stable cardiac rhythm.  1.Post-op left MCA CVA on POD-3 with subsequent thrombectomy / recanalization of occluded M1/MCA by IR. Making progress with mobility ans speech.  Continue PT/OT/ST. MBS on 3/26 demonstrated significant deficits in swallow function and high risk for aspiration. Repeat eval at discretion of SLP.  Evaluated by inpatient rehab team and CIR recommended when medically stable--Anticipate we can plan for transition to rehab later this week.  2.Post-op Seizure- likely secondary to above. On Kepra with no apparent subsequent seizures.  3.Bilateral pulmonary emboli post-op- Initially treated with heparin and now on Lovenox therapy that is being dosed by pharmacy. TEE demonstrated a PFO with L->R shunt, no residual LAA thrombus.  4.Post-op respiratory failure- required re-intubation and eventual trach and subsequently has had steady improvement.  Now on RA with excellent O2 saturations and no evidence of distress. Lurline Idol was down-sized to #6 cuffless cannulaon 3/24 and now has PMV in place.  Secretions cultured and showed resp flora and WBC trended down to 9800 by 3/27.On day 5 ABX coverage, no clear indication for continued coverage. Will  d/c ABX today.   5.Expected acute blood loss anemia- 8.1/25.9 on 3/27. Repeat labs in AM.   6.History of thyroid cancer, s/p thyroidectomy- on appropriate replacement therapy .   LOS: 18 days    Antony Odea, [name] [phone] 02/13/2020

## 2020-02-13 NOTE — Progress Notes (Signed)
NAME:  [name] [name], MRN:  [id number], DOB:  1941/05/12, LOS: 57 ADMISSION DATE:  01/26/2020, CONSULTATION DATE:  01/30/2020 REFERRING MD:  Rory Percy CHIEF COMPLAINT:  Right hemiplegia   Brief History        This 79 year old was admitted on 3/8 for evaluation of chest pain.  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  She was up ambulating  and suddenly developed a right hemiplegia.  CTA showed an M1 occlusion and thrombectomy was performed.  An attempt was made to extubate the patient post procedure but this was not tolerated.  Of note at the time the CTA was performed she was also found to have pulmonary emboli.  History of present illness       History is as noted above with the following additions.  Intraoperative TEE showed essentially normal LV systolic dysfunction there was some LVH and a PFO with a left to right flow.  There is no thrombus noted in the left atrial appendage.  Past Medical History  Thyroid cancer, hypertension, hyperlipidemia.  Significant Hospital Events   Cardiac cath 3/12, CABG 3/13 3/15: Acute stroke with right-sided hemiplegia, also found to have acute pulmonary emboli 3/16 had seizure placed on Keppra 3/18 extubated, had upper airway edema, reintubated, noted to have significant airway swelling 3/19 tracheostomy placed at bedside 320 through 3/23: Tolerating aerosol trach collar, tolerating 28% is having bloody tracheal secretions 3/24: Changed to cuffless trach at bedside moved to stepdown unit 3/29: Has Passy-Muir valve in place, strong cough Consults:  Thoracic surgery, neurology, interventional radiology.  Procedures:  Four-vessel CABG, left M1 thrombectomy 3/19 trach  Significant Diagnostic Tests:  3/15 LE venous doppler:  RIGHT:  - There is no evidence of deep vein thrombosis in the lower extremity.    - No cystic structure found in the popliteal fossa.    LEFT:  - Findings  consistent with acute deep vein thrombosis involving the left  posterior tibial veins.  - No cystic structure found in the popliteal fossa.  3/15 echo: LVEF [id number], grade 1 diastolic dysfunction, LA dilation, RVSP mildly elevated 3/16 cth: No acute intracranial hemorrhage. Likely evolving recent infarction of the lentiform nucleus. 3/15 cta head/neck 1. Emergent large vessel occlusion at the left M1 segment. 2. Aspects of 10 with CT perfusion suggesting core infarct of 74 cc and 85 cc of penumbra. 3. Flow reducing left ICA bulb stenosis. 4. Bilateral subsegmental acute pulmonary emboli. 5. Proximal occlusion of the non dominant left vertebral artery with reconstitution at the dura. 3/16 eeg: This study showed evidence of epileptogenicity arising from left frontotemporal as well as cortical dysfunction in left hemisphere likely secondary to underlying stroke. No seizures were seen throughout the recording.  Micro Data:  3/8 sars2: neg  Antimicrobials:  Vancomycin and Zosyn started 3/25, discontinued 3/26 Ceftriaxone started 3/26, to get 4 total doses of this-today will be day 4  Interim history/subjective:  Strong voice , strong cough Intermittent suctioning No distress  Objective   Blood pressure (!) 116/52, pulse 74, temperature 98.2 F (36.8 C), temperature source Oral, resp. rate 18, height 5\' 3"  (1.6 m), weight 66.3 kg, SpO2 100 %.    FiO2 (%):  [21 %-28 %] 28 %   Intake/Output Summary (Last 24 hours) at 02/13/2020 0817 Last data filed at 02/13/2020 0300 Gross per 24 hour  Intake 1240 ml  Output 1050 ml  Net 190 ml   [employer]  02/11/20 0425 02/12/20 0630 02/13/20 0426  Weight: 66.1 kg 66.4 kg 66.3 kg    Examination:  General Pleasant 79 year old female resting in bed, in no distress HEENT normocephalic, atraumatic, size 6 cuffless trach in place.  PMV in place  Pulmonary: Clear to auscultation no accessory use Cardiac regular rate and rhythm Abdomen: Soft,  nontender, bowel sounds appreciated Neuro: Awake and alert, some expressive aphasia Extremities warm and dry  Resolved Hospital Problem list     Assessment & Plan:   Acute L MCA CVA: Status post thrombectomy complicated by seizure Acute Pulmonary emboli-on anticoagulation Acute hypoxic Respiratory failure now tracheostomy dependent following failed extubation due to upper airway obstruction from vocal cord edema.  Tolerating PMV, voice is stronger, cough is improving Status post coronary artery bypass grafting on 3/13 combined HF, htn:  TEE with PFO -LVEF 30-35% with grade 1 diastolic dysfunction Acute L LE DVT Hypothyroidism   Pulmonary problem list  Tracheobronchitis been treated Tracheostomy secondary to upper airway obstruction  Data:  -s/p trach 3/19, changed to cuffless 3/24-tolerating this well with PMV in place -Final day of ceftriaxone  Discussion: Secretions continue to improve No significant work of breathing Tolerating PMV well She is not being suctioned for significant amount of secretions Speech pathology continues to follow  Plan: If secretions continue to improve, May be downsized to a 4 cuffless trach  Inspection of the vocal cords may be needed though, appears to have a very strong cough with good phonation at present  We will consider capping trach once able to tolerate a 4 cuffless trach  Sherrilyn Rist, MD [place] [facility] Pager: [phone]

## 2020-02-13 NOTE — Progress Notes (Signed)
CARDIAC REHAB PHASE I   PRE:  Rate/Rhythm: 76 SR  BP:  Sitting: 106/50      SaO2: 95 RA  MODE:  Ambulation: 230 ft   POST:  Rate/Rhythm: 98 SR  BP:  Sitting: 135/56    SaO2: 96 RA  Pt helped to [facility] then ambulated in hallway assist of 2 for equipment. Pt able to rise independently, standby assist in hallway. Pt states she continues to feel stronger. Encouraged continued ambulation. Will continue to follow.  [place], RN BSN 02/13/2020 2:58 PM

## 2020-02-13 NOTE — Progress Notes (Signed)
Inpatient Rehabilitation-Admissions Coordinator   Met with pt bedside. She was much more alert and was able to vocalize for the first time since I've seen her. We discussed rehab options and support at DC. Pt aware she would need supervision after an IP Rehab stay and currently does not have that support as her neighbors can only check in on her minimally at home. Pt understands she will need short term rehab at a SNF due to lack of caregiver assist. She gave me permission to update her nephew Ozzie Hoyle [phone]) regarding her rehab recommendations. Ozzie Hoyle is requesting that SW keep him in the loop on her DC plans as well.   AC will sign off.   Raechel Ache, OTR/L  Rehab Admissions Coordinator  [phone] 02/13/2020 4:59 PM

## 2020-02-13 NOTE — Progress Notes (Signed)
OT Treatment Note  Initiated theraputty written HEP with pt to increase functional use of R hand. Will continue to follow acutely. Recommend rehab at SNF. Will continue to follow acutely.    02/13/20 1800  OT Visit Information  Last OT Received On 02/13/20  Assistance Needed +1  History of Present Illness Pt is a 79 y.o. female admitted 01/26/20 after cardiac cath showed severe vessel disease; s/p CABG x4 on 3/13. 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 Pt with bilateral PEs. Taken to IR and found to have L M1/MCA occlusion, s/p emergent mechanical thrombectomy. ETT 3/12-3/13 (for procedure), 3/15-3/19. S/p trach 3/19. PMH includes CAD, HTN, THA, osteoporosis.  Precautions  Precautions Sternal  Pain Assessment  Pain Assessment No/denies pain  Cognition  Arousal/Alertness Awake/alert  Behavior During Therapy Flat affect  Overall Cognitive Status Impaired/Different from baseline  Area of Impairment Problem solving;Awareness;Safety/judgement;Memory;Attention  Current Attention Level Selective  Memory Decreased short-term memory  Following Commands Follows one step commands consistently  Safety/Judgement Decreased awareness of deficits  Awareness Emergent  Problem Solving Slow processing  General Comments perseveration noted during theraputty task; Had difficulty switching tasks/exercises  Other Exercises  Other Exercises Educated pt on level 2 (yellow) theraputty exercises; written handout provided; pt had difficulty switching exercises - appears to be perseverating  OT - End of Session  Activity Tolerance Patient tolerated treatment well  Patient left in bed;with call bell/phone within reach;with bed alarm set  Nurse Communication Mobility status  OT Assessment/Plan  OT Plan Discharge plan remains appropriate;Frequency needs to be updated  OT Visit Diagnosis Unsteadiness on feet (R26.81);Other  abnormalities of gait and mobility (R26.89);Muscle weakness (generalized) (M62.81);Hemiplegia and hemiparesis  Hemiplegia - Right/Left Right  Hemiplegia - dominant/non-dominant Dominant  Hemiplegia - caused by Cerebral infarction  OT Frequency (ACUTE ONLY) Min 2X/week  Follow Up Recommendations SNF;Supervision/Assistance - 24 hour  OT Equipment Other (comment) (TBA)  AM-PAC OT "6 Clicks" Daily Activity Outcome Measure (Version 2)  Help from another person eating meals? 1 (NPO)  Help from another person taking care of personal grooming? 3  Help from another person toileting, which includes using toliet, bedpan, or urinal? 3  Help from another person bathing (including washing, rinsing, drying)? 2  Help from another person to put on and taking off regular upper body clothing? 2  Help from another person to put on and taking off regular lower body clothing? 2  6 Click Score 13  OT Goal Progression  Progress towards OT goals Progressing toward goals  Acute Rehab OT Goals  Patient Stated Goal to return to independence  OT Goal Formulation With patient  Time For Goal Achievement 02/16/20  Potential to Achieve Goals Good  ADL Goals  Pt Will Perform Grooming with min assist;standing  Pt Will Transfer to Toilet with min assist;stand pivot transfer;bedside commode  Pt/caregiver will Perform Home Exercise Program Increased strength;Right Upper extremity;With written HEP provided;With Supervision  Additional ADL Goal #1 Pt will complete BADL task with min A and <3 VC's for problem solving  Additional ADL Goal #2 Pt will verbalize sternal precautions with <3 VC's while completing BADL task  Additional ADL Goal #3 Pt will apply visual compensatory strategies to BADL with min A for increased safety in functional tasks  OT Time Calculation  OT Start Time (ACUTE ONLY) 1640  OT Stop Time (ACUTE ONLY) 1655  OT Time Calculation (min) 15 min  OT General Charges  $OT  Visit 1 Visit  OT Treatments   $Therapeutic Activity 8-22 mins  Maurie Boettcher, OT/L   Acute OT Clinical Specialist Acute Rehabilitation Services Pager [phone] Office [phone]

## 2020-02-14 LAB — GLUCOSE, CAPILLARY
Glucose-Capillary: 104 mg/dL — ABNORMAL HIGH (ref 70–99)
Glucose-Capillary: 113 mg/dL — ABNORMAL HIGH (ref 70–99)
Glucose-Capillary: 131 mg/dL — ABNORMAL HIGH (ref 70–99)
Glucose-Capillary: 143 mg/dL — ABNORMAL HIGH (ref 70–99)
Glucose-Capillary: 149 mg/dL — ABNORMAL HIGH (ref 70–99)
Glucose-Capillary: 97 mg/dL (ref 70–99)

## 2020-02-14 LAB — BASIC METABOLIC PANEL
Anion gap: 9 (ref 5–15)
BUN: 21 mg/dL (ref 8–23)
CO2: 25 mmol/L (ref 22–32)
Calcium: 9.9 mg/dL (ref 8.9–10.3)
Chloride: 104 mmol/L (ref 98–111)
Creatinine, Ser: 0.75 mg/dL (ref 0.44–1.00)
GFR calc Af Amer: >60 mL/min (ref >60)
GFR calc non Af Amer: >60 mL/min (ref >60)
Glucose, Bld: 102 mg/dL — ABNORMAL HIGH (ref 70–99)
Potassium: 4.5 mmol/L (ref 3.5–5.1)
Sodium: 138 mmol/L (ref 135–145)

## 2020-02-14 LAB — CBC
HCT: 31.6 % — ABNORMAL LOW (ref 36.0–46.0)
Hemoglobin: 9.8 g/dL — ABNORMAL LOW (ref 12.0–15.0)
MCH: 29.1 pg (ref 26.0–34.0)
MCHC: 31 g/dL (ref 30.0–36.0)
MCV: 93.8 fL (ref 80.0–100.0)
Platelets: 421 10*3/uL — ABNORMAL HIGH (ref 150–400)
RBC: 3.37 MIL/uL — ABNORMAL LOW (ref 3.87–5.11)
RDW: 14.7 % (ref 11.5–15.5)
WBC: 9.9 10*3/uL (ref 4.0–10.5)
nRBC: 0 % (ref 0.0–0.2)

## 2020-02-14 NOTE — Progress Notes (Signed)
  Speech Language Pathology Treatment: Cognitive-Linquistic  Patient Details Name: [name] [name] MRN: [id number] DOB: 24-Feb-1941 Today's Date: 02/14/2020 Time: 1213-1230 SLP Time Calculation (min) (ACUTE ONLY): 17 min  Assessment / Plan / Recommendation Clinical Impression  Pt alert, but verbalized feeling down today.  Hearing aid batteries replaced in both units, and hearing improved dramatically.  Pt participated in further aphasia assessment now verbalizations permit it. PMV present upon entering room - pt tolerating well.   One-step commands - 100% accuracy; two-step - 60% Body part I.d. 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    Pt will need ongoing intensive speech and swallowing therapy upon D/C. Plan is to repeat MBS the latter part of this week.  Pt agrees with plan.   HPI HPI:  79 y.o. female admitted 01/26/20 after cardiac cath showed severe vessel disease; s/p CABG x4 on 3/13. 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 Pt with bilateral PEs. Taken to IR and found to have L M1/MCA occlusion, s/p emergent mechanical thrombectomy. ETT 3/12-3/13 (for procedure), 3/15-3/19. S/p trach 3/19. PMH includes CAD, HTN, THA, osteoporosis.  MBS 3/26 with severe dysphagia and aspiration.        SLP Plan  Continue with current plan of care;MBS(MBS end of week)       Recommendations  Diet recommendations: NPO      Patient may use Passy-Muir Speech Valve: Intermittently with  supervision PMSV Supervision: Intermittent         Oral Care Recommendations: Oral care QID SLP Visit Diagnosis: Aphasia (R47.01) Plan: Continue with current plan of care;MBS(MBS end of week)                 Tayron Hunnell L. Tivis Ringer, [place] CCC/SLP Acute Rehabilitation Services Office number [phone] Pager [phone]       [name] Laurice 02/14/2020, 2:04 PM

## 2020-02-14 NOTE — Progress Notes (Signed)
CARDIAC REHAB PHASE I   PRE:  Rate/Rhythm: 70 SR  BP:  Sitting: 116/48      SaO2: 95 RA  MODE:  Ambulation: 140 ft   POST:  Rate/Rhythm: 94 SR  BP:  Sitting: 158/76    SaO2: 96 RA  Pt agreeable to ambulate. Pt helped to [facility] then ambulated 163ft in hallway assist x1 with front wheel walker. Pt denies pain, dizziness, or SOB throughout the walk. Pt returned to recliner. Call bell and bedside table within reach. Will continue to follow.  [id number] Rufina Falco, RN BSN 02/14/2020 3:14 PM

## 2020-02-14 NOTE — TOC Transition Note (Signed)
Transition of Care [facility]) - CM/SW Discharge Note   Patient Details  Name: [name] [name] MRN: [id number] Date of Birth: 05-17-1941  Transition of Care [facility]) CM/SW Contact:  Vinie Sill, [place] Phone Number: 02/14/2020, 10:13 AM   Clinical Narrative:     CSW met with patient at bedside. CSW introduced self and explained role. CSW discussed [facility]rehab at [facility]. Patient states she lives alone and is agreeable to ST rehab at [facility]. Patient states no SNF preference. CSW explained the SNF process and advised will send out SNF referral when closer to discharge readiness. Patient states she understands and has no questions at this time.   CSW will send out SNF referrals when patient no longer needs cortrak. CSW will continue to follow and assist with discharge planning.  Thurmond Butts, MSW, LCSWA Clinical Social Worker     Barriers to Discharge: Continued Medical Work up, Ship broker, SNF Pending bed offer   Patient Goals and CMS Choice        Discharge Print production planner and Services In-house Referral: Clinical Social Work                                   Social Determinants of Health (SDOH) Interventions     Readmission Risk Interventions No flowsheet data found.

## 2020-02-14 NOTE — Progress Notes (Signed)
Physical Therapy Treatment Patient Details Name: [name] [name] MRN: [id number] DOB: [birth year] Today's Date: 02/14/2020    History of Present Illness Pt is a 79 y.o. female admitted 01/26/20 after cardiac cath showed severe vessel disease; s/p CABG x4 on 3/13. 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 Pt with bilateral PEs. Taken to IR and found to have L M1/MCA occlusion, s/p emergent mechanical thrombectomy. ETT 3/12-3/13 (for procedure), 3/15-3/19. S/p trach 3/19. PMH includes CAD, HTN, THA, osteoporosis.    PT Comments    Patient's mobility and session limited today secondary to lethargy and arousal. Pt asleep in chair upon arrival and requesting to return to bed. Difficulty staying alert without constant stimulus. Requires min A for standing with cues to adhere to sternal precautions. Able to take a few steps to get to bed with use of RW and min A for safety. Noted to have continued cognitive deficits relating to arousal, attention, problem solving and speech. Will continue to follow and progress as tolerated.   Follow Up Recommendations  SNF     Equipment Recommendations  Wheelchair cushion (measurements PT);Wheelchair (measurements PT);3in1 (PT)    Recommendations for Other Services       Precautions / Restrictions Precautions Precautions: Sternal Precaution Booklet Issued: No Precaution Comments: PMSV; trach; coretrack Restrictions Weight Bearing Restrictions: Yes Other Position/Activity Restrictions: sternal precs    Mobility  Bed Mobility Overal bed mobility: Needs Assistance Bed Mobility: Sit to Supine       Sit to supine: HOB elevated;Mod assist   General bed mobility comments: Assist needed to bring LEs into bed to return to supine.  Transfers Overall transfer level: Needs assistance Equipment used: Rolling walker (2 wheeled) Transfers: Sit to/from Stand Sit to Stand:  Min assist         General transfer comment: Assist to power to standing with pt pulling up on RW; cues for technique and use of momentum to adhere to sternal precautions.  Ambulation/Gait Ambulation/Gait assistance: Min assist Gait Distance (Feet): 4 Feet Assistive device: Rolling walker (2 wheeled) Gait Pattern/deviations: Decreased stride length;Shuffle;Trunk flexed Gait velocity: decr   General Gait Details: Able to take a few steps to get to bed with Min A for balance; declined ambulation today as pt barely able to stay awake, falling asleep without stimulus.   Stairs             Wheelchair Mobility    Modified Rankin (Stroke Patients Only) Modified Rankin (Stroke Patients Only) Pre-Morbid Rankin Score: No symptoms Modified Rankin: Moderately severe disability     Balance Overall balance assessment: Needs assistance Sitting-balance support: Feet supported Sitting balance-Leahy Scale: Fair     Standing balance support: During functional activity Standing balance-Leahy Scale: Poor Standing balance comment: Requires UE support in standing.                            Cognition Arousal/Alertness: Lethargic Behavior During Therapy: Flat affect Overall Cognitive Status: Impaired/Different from baseline Area of Impairment: Attention;Orientation;Memory;Following commands;Safety/judgement;Awareness;Problem solving                 Orientation Level: Disoriented to;Place;Time;Situation Current Attention Level: Focused Memory: Decreased short-term memory Following Commands: Follows one step commands consistently(with repetition) Safety/Judgement: Decreased awareness of deficits   Problem Solving: Slow processing General Comments: Difficulty sustaining attention today; very sleepy. Stating "July" for month as she is reading the calendar on  the wall. Needs constant stimulus to stay alert.      Exercises      General Comments General comments  (skin integrity, edema, etc.): Pt on trach collar, RA. VSS.      Pertinent Vitals/Pain Pain Assessment: No/denies pain    Home Living                      Prior Function            PT Goals (current goals can now be found in the care plan section) Progress towards PT goals: Not progressing toward goals - comment(due to lethargy\)    Frequency    Min 3X/week      PT Plan Current plan remains appropriate    Co-evaluation              AM-PAC PT "6 Clicks" Mobility   Outcome Measure  Help needed turning from your back to your side while in a flat bed without using bedrails?: A Little Help needed moving from lying on your back to sitting on the side of a flat bed without using bedrails?: A Lot Help needed moving to and from a bed to a chair (including a wheelchair)?: A Little Help needed standing up from a chair using your arms (e.g., wheelchair or bedside chair)?: A Little Help needed to walk in hospital room?: A Little Help needed climbing 3-5 steps with a railing? : A Lot 6 Click Score: 16    End of Session Equipment Utilized During Treatment: Gait belt Activity Tolerance: Patient limited by lethargy Patient left: in bed;with call bell/phone within reach;with bed alarm set Nurse Communication: Mobility status PT Visit Diagnosis: Other abnormalities of gait and mobility (R26.89);Hemiplegia and hemiparesis Hemiplegia - Right/Left: Right Hemiplegia - dominant/non-dominant: Dominant Hemiplegia - caused by: Cerebral infarction     Time: [id number] PT Time Calculation (min) (ACUTE ONLY): 11 min  Charges:  $Therapeutic Activity: 8-22 mins                     Marisa Severin, PT, DPT Acute Rehabilitation Services Pager [phone] Office [phone]       Marguarite Arbour A Zayn Selley 02/14/2020, 1:30 PM

## 2020-02-14 NOTE — Plan of Care (Signed)
  Problem: Nutrition: Goal: Adequate nutrition will be maintained Outcome: Progressing   Problem: Coping: Goal: Level of anxiety will decrease Outcome: Progressing   

## 2020-02-14 NOTE — Progress Notes (Signed)
NAME:  [name] [name], MRN:  [id number], DOB:  Mar 21, 1941, LOS: 4 ADMISSION DATE:  01/26/2020, CONSULTATION DATE:  01/30/2020 REFERRING MD:  Rory Percy CHIEF COMPLAINT:  Right hemiplegia   Brief History        This 79 year old was admitted on 3/8 for evaluation of chest pain.  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  She was up ambulating  and suddenly developed a right hemiplegia.  CTA showed an M1 occlusion and thrombectomy was performed.  An attempt was made to extubate the patient post procedure but this was not tolerated.  Of note at the time the CTA was performed she was also found to have pulmonary emboli.  History of present illness       History is as noted above with the following additions.  Intraoperative TEE showed essentially normal LV systolic dysfunction there was some LVH and a PFO with a left to right flow.  There is no thrombus noted in the left atrial appendage.  Past Medical History  Thyroid cancer, hypertension, hyperlipidemia.  Significant Hospital Events   Cardiac cath 3/12, CABG 3/13 3/15: Acute stroke with right-sided hemiplegia, also found to have acute pulmonary emboli 3/16 had seizure placed on Keppra 3/18 extubated, had upper airway edema, reintubated, noted to have significant airway swelling 3/19 tracheostomy placed at bedside 320 through 3/23: Tolerating aerosol trach collar, tolerating 28% is having bloody tracheal secretions 3/24: Changed to cuffless trach at bedside moved to stepdown unit 3/29: Has Passy-Muir valve in place, strong cough Consults:  Thoracic surgery, neurology, interventional radiology.  Procedures:  Four-vessel CABG, left M1 thrombectomy 3/19 trach  Significant Diagnostic Tests:  3/15 LE venous doppler:  RIGHT:  - There is no evidence of deep vein thrombosis in the lower extremity.    - No cystic structure found in the popliteal fossa.    LEFT:  - Findings  consistent with acute deep vein thrombosis involving the left  posterior tibial veins.  - No cystic structure found in the popliteal fossa.  3/15 echo: LVEF [id number], grade 1 diastolic dysfunction, LA dilation, RVSP mildly elevated 3/16 cth: No acute intracranial hemorrhage. Likely evolving recent infarction of the lentiform nucleus. 3/15 cta head/neck 1. Emergent large vessel occlusion at the left M1 segment. 2. Aspects of 10 with CT perfusion suggesting core infarct of 74 cc and 85 cc of penumbra. 3. Flow reducing left ICA bulb stenosis. 4. Bilateral subsegmental acute pulmonary emboli. 5. Proximal occlusion of the non dominant left vertebral artery with reconstitution at the dura. 3/16 eeg: This study showed evidence of epileptogenicity arising from left frontotemporal as well as cortical dysfunction in left hemisphere likely secondary to underlying stroke. No seizures were seen throughout the recording.  Micro Data:  3/8 sars2: neg  Antimicrobials:  Vancomycin and Zosyn started 3/25, discontinued 3/26 Ceftriaxone started 3/26, to get 4 total doses of this-today will be day 4  Interim history/subjective:  Very flat affect today Not feeling very well Will not offer specific complaint  Objective   Blood pressure (!) 162/83, pulse 66, temperature (!) 97.4 F (36.3 C), temperature source Oral, resp. rate 18, height 5\' 3"  (1.6 m), weight 64.6 kg, SpO2 98 %.        Intake/Output Summary (Last 24 hours) at 02/14/2020 1613 Last data filed at 02/14/2020 0911 Gross per 24 hour  Intake 10 ml  Output --  Net 10 ml   Filed Weights   02/12/20 0630  02/13/20 0426 02/14/20 0205  Weight: 66.4 kg 66.3 kg 64.6 kg    Examination:  Flat affect, comfortable with oxygen supplementation in place HEENT PMV in place, 6 cuffless trach   Pulmonary: Clear to auscultation no accessory use Cardiac regular rate and rhythm  Resolved Hospital Problem list     Assessment & Plan:   Acute L MCA  CVA: Status post thrombectomy complicated by seizure Acute Pulmonary emboli-on anticoagulation Acute hypoxic Respiratory failure now tracheostomy dependent following failed extubation due to upper airway obstruction from vocal cord edema.  Tolerating PMV, voice is stronger, cough is improving Status post coronary artery bypass grafting on 3/13 combined HF, htn:  TEE with PFO -LVEF 30-35% with grade 1 diastolic dysfunction Acute L LE DVT Hypothyroidism   Pulmonary problem list  Tracheobronchitis been treated Tracheostomy secondary to upper airway obstruction  Data:  -s/p trach 3/19, changed to cuffless 3/24-tolerating this well with PMV in place -Completed ceftriaxone  Discussion: Secretions continue to improve No significant work of breathing Tolerating PMV well She is not being suctioned for significant amount of secretions Speech pathology continues to follow Affect is very flat today -Will suggest to consider antidepressant  Plan: If secretions continue to improve, May be downsized to a 4 cuffless trach-not downsizing today as she is not feeling very well  Inspection of the vocal cords may be needed though, appears to have a very strong cough with good phonation at present  Sherrilyn Rist, MD [place] [facility] Pager: [phone]

## 2020-02-14 NOTE — Progress Notes (Signed)
15 Days Post-Op Procedure(s) (LRB): IR WITH ANESTHESIA (N/A) Subjective: Awake and alert, responds appropriately and attempts to engage conversation but has difficulty with word finding.  She will not have full time supervison available in her home following discharge recommendation for CIR has been changed to SNF.  SLP allowing ice chips only PO.   Objective: Vital signs in last 24 hours: Temp:  [97.9 F (36.6 C)-98.2 F (36.8 C)] 97.9 F (36.6 C) (03/30 0421) Pulse Rate:  [70-81] 71 (03/30 0421) Cardiac Rhythm: Normal sinus rhythm (03/29 1900) Resp:  [14-22] 18 (03/30 0421) BP: (100-133)/(44-54) 131/49 (03/30 0421) SpO2:  [96 %-100 %] 100 % (03/30 0421) Weight:  [64.6 kg] 64.6 kg (03/30 0205)     Intake/Output from previous day: 03/29 0701 - 03/30 0700 In: 10 [I.V.:10] Out: -  Intake/Output this shift: No intake/output data recorded.  Physical Exam: General appearance: alert, cooperative and no distress Neurologic: expressive dysphasia. Progressing with mobility. RUE strength improving. Heart: NSR, no arrhythmias Lungs: clear to auscultation bilaterally. Minimal secretions. Trach site is dry.  Abdomen: soft and non-tender. Extremities: all warm and well perfused. Left arm incision is intact and dry.   Wound: the sternal incision is intact and dry. PICC insertion site dry  Lab Results: Recent Labs    02/14/20 0336  WBC 9.9  HGB 9.8*  HCT 31.6*  PLT 421*   BMET:  Recent Labs    02/14/20 0336  NA 138  K 4.5  CL 104  CO2 25  GLUCOSE 102*  BUN 21  CREATININE 0.75  CALCIUM 9.9    PT/INR: No results for input(s): LABPROT, INR in the last 72 hours. ABG    Component Value Date/Time   PHART 7.427 02/02/2020 1735   HCO3 28.5 (H) 02/02/2020 1735   TCO2 30 02/02/2020 1735   ACIDBASEDEF 2.0 01/28/2020 1545   O2SAT 100.0 02/02/2020 1735   CBG (last 3)  Recent Labs    02/13/20 2119 02/14/20 0030 02/14/20 0441  GLUCAP 128* 149* 104*     Assessment/Plan: S/P Procedure(s) (LRB): IR WITH ANESTHESIA (N/A)  POD14CABG x 4 with all arterial grafts for severe MVCAD and 70% left main coronary stenosis. Pre-op EF 35% by echo and 55% by LV-gram at cath. On ASA, atorvastatin, metoprolol, lisinopril, and isosorbide (for radial graft patency). Stable cardiac rhythm.  1.Post-op left MCA CVA on POD-3 with subsequent thrombectomy / recanalization of occluded M1/MCA by IR. Making progress with mobility and speech.  Continue PT/OT/ST. MBS on 3/26 demonstrated significant deficits in swallow function and high risk for aspiration. Tolerating ice chips. Repeat eval at discretion of SLP.  Not a candidate for CIR due to lack of inn-home supervision post discharge. Will need SNF for rehab.   2.Post-op Seizure- likely secondary to above. On Kepra with no apparent subsequent seizures.  3.Bilateral pulmonary emboli post-op- Initially treated with heparin and now on Lovenox therapy that is being dosed by pharmacy. TEE demonstrated a PFO with L->R shunt, no residual LAA thrombus.  4.Post-op respiratory failure- required re-intubation and eventual trach and subsequently has had steady improvement.  Now on RA with excellent O2 saturations and no evidence of distress. Now has PMV in place. Secretions minimal.   5.Expected acute blood loss anemia- Hct trending up -> 31%.  6.History of thyroid cancer, s/p thyroidectomy- on appropriate replacement therapy .  LOS: 19 days    [name] [name], [name] [phone] 02/14/2020

## 2020-02-14 NOTE — Progress Notes (Signed)
CARDIAC REHAB PHASE I   Went to offer to walk pt. Pt fast asleep in the bed. Will f/u later and encourage ambulation.  Rufina Falco, RN BSN 02/14/2020 11:15 AM

## 2020-02-15 ENCOUNTER — Encounter: Payer: Self-pay | Admitting: *Deleted

## 2020-02-15 LAB — GLUCOSE, CAPILLARY
Glucose-Capillary: 106 mg/dL — ABNORMAL HIGH (ref 70–99)
Glucose-Capillary: 114 mg/dL — ABNORMAL HIGH (ref 70–99)
Glucose-Capillary: 115 mg/dL — ABNORMAL HIGH (ref 70–99)
Glucose-Capillary: 115 mg/dL — ABNORMAL HIGH (ref 70–99)
Glucose-Capillary: 116 mg/dL — ABNORMAL HIGH (ref 70–99)
Glucose-Capillary: 122 mg/dL — ABNORMAL HIGH (ref 70–99)
Glucose-Capillary: 125 mg/dL — ABNORMAL HIGH (ref 70–99)
Glucose-Capillary: 130 mg/dL — ABNORMAL HIGH (ref 70–99)

## 2020-02-15 MED ORDER — SERTRALINE HCL 25 MG PO TABS
25.0000 mg | ORAL_TABLET | Freq: Every day | ORAL | Status: DC
  Administered 2020-02-15 – 2020-02-16 (×2): 25 mg
  Filled 2020-02-15 (×2): qty 1

## 2020-02-15 MED ORDER — ENOXAPARIN SODIUM 60 MG/0.6ML ~~LOC~~ SOLN
60.0000 mg | Freq: Two times a day (BID) | SUBCUTANEOUS | Status: DC
  Administered 2020-02-15 – 2020-02-17 (×4): 60 mg via SUBCUTANEOUS
  Filled 2020-02-15 (×6): qty 0.6

## 2020-02-15 NOTE — Progress Notes (Addendum)
[address]       [place] [address]             [phone]       16 Days Post-Op Procedure(s) (LRB): IR WITH ANESTHESIA (N/A) Subjective: Affect is brighter today. She is asking specific questions about her the plan of care, about the trach, etc.  Walked in the hall several times yesterday. Remains on RA with acceptable O2 sats.   Objective: Vital signs in last 24 hours: Temp:  [97.4 F (36.3 C)-98.7 F (37.1 C)] 97.8 F (36.6 C) (03/31 0310) Pulse Rate:  [61-96] 96 (03/31 0629) Cardiac Rhythm: Normal sinus rhythm (03/30 1900) Resp:  [18-25] 25 (03/31 0629) BP: (115-162)/(48-83) 127/56 (03/31 0310) SpO2:  [95 %-97 %] 95 % (03/31 0629) Weight:  [54.7 kg] 54.7 kg (03/31 0629)     Intake/Output from previous day: 03/30 0701 - 03/31 0700 In: 473 [I.V.:10; NG/GT:463] Out: -  Intake/Output this shift: No intake/output data recorded.  Physical Exam: General appearance:alert, cooperative and no distress Neurologic:expressive dysphasia seems to be improving. Progressing with mobility. RUE strength improving. Heart:NSR, no arrhythmias Lungs:Breath sounds clear. Minimal secretions. Trach site is dry.  Abdomen:soft and non-tender. Extremities:all warm and well perfused. Left arm incision is intact and dry. Wound:the sternal incision is intact and dry.PICC insertion site dry   Lab Results: Recent Labs    02/14/20 0336  WBC 9.9  HGB 9.8*  HCT 31.6*  PLT 421*   BMET:  Recent Labs    02/14/20 0336  NA 138  K 4.5  CL 104  CO2 25  GLUCOSE 102*  BUN 21  CREATININE 0.75  CALCIUM 9.9    PT/INR: No results for input(s): LABPROT, INR in the last 72 hours. ABG    Component Value Date/Time   PHART 7.427 02/02/2020 1735   HCO3 28.5 (H) 02/02/2020 1735   TCO2 30 02/02/2020 1735   ACIDBASEDEF 2.0 01/28/2020 1545   O2SAT 100.0 02/02/2020 1735   CBG (last 3)  Recent Labs    02/15/20 0038 02/15/20 0407 02/15/20 0812  GLUCAP  130* 114* 115*    Assessment/Plan: S/P Procedure(s) (LRB): IR WITH ANESTHESIA (N/A)  POD19CABG x 4 with all arterial grafts for severe MVCAD and 70% left main coronary stenosis. Pre-op EF 35% by echo and 55% by LV-gram at cath. On ASA, atorvastatin, metoprolol, lisinopril, and isosorbide (for radial graft patency). Stable cardiac rhythm.  1.Post-op left MCA CVA on POD-3 with subsequent thrombectomy / recanalization of occluded M1/MCA by IR.Making progress with mobility and speech.Continue PT/OT/ST.MBS on 3/26 demonstrated significant deficits in swallow function and high risk for aspiration.  Repeat swallow eval at discretion of SLP.Not a candidate for CIR due to lack of in-home supervision post discharge. Will need SNF for rehab. Hoping she will progress to the point of not requiring a feeding tube prior to discharge.  Appreciate the efforts of the multiple therapists participating in her care.  2.Post-op Seizure- likely secondary to above. On Kepra with no apparent subsequent seizures.  3.Bilateral pulmonary emboli post-op- Initially treated with heparin and now on Lovenox therapy that is being dosed by pharmacy. TEE demonstrated a PFO with L->R shunt, no residual LAA thrombus.  4.Post-op respiratory failure- required re-intubation and eventual trach and subsequently has had steady improvement.Now on RA with excellent O2 saturations and no evidence of distress.Has had PMV in place past 3 days. Secretions minimal. Trach mgt per CCM.  5.Expected acute blood loss anemia- Hct trending  up -> 31%.on 3/30.   6.History of thyroid cancer, s/p thyroidectomy- on appropriate replacement therapy  7. Suspected post CVA depression--noted by several caregivers. Will start low-dose SSRI and monitor for effect and will titrate as needed.   LOS: 20 days      Antony Odea, [name] [phone] 02/15/2020 Pt seen and examined; agree with assessment and plan. Raekwan Spelman Z.  Orvan Seen, [place]

## 2020-02-15 NOTE — Progress Notes (Signed)
ANTICOAGULATION CONSULT NOTE - [place] for enoxaparin Indication: pulmonary embolus, stroke and DVT, and recent CABG  Allergies  Allergen Reactions  . Bee Venom Swelling    Massive swelling  . Penicillins Other (See Comments)    ** tolerates Zosyn + cephalosporins Did it involve swelling of the face/tongue/throat, SOB, or low BP? Unknown Did it involve sudden or severe rash/hives, skin peeling, or any reaction on the inside of your mouth or nose? Unknown Did you need to seek medical attention at a hospital or doctor's office? Unknown When did it last happen?1945 If all above answers are "NO", may proceed with cephalosporin use.  . Adhesive [Tape] Rash  . Morphine And Related Rash  . Sulfa Antibiotics Nausea And Vomiting and Rash    Patient Measurements: Height: 5\' 3"  (160 cm) Weight: 128 lb 3.2 oz (58.2 kg) IBW/kg (Calculated) : 52.4  Vital Signs: Temp: 98 F (36.7 C) (03/31 0845) Temp Source: Oral (03/31 0845) BP: 126/57 (03/31 0845) Pulse Rate: 73 (03/31 0845)  Labs: Recent Labs    02/14/20 0336  HGB 9.8*  HCT 31.6*  PLT 421*  CREATININE 0.75    Estimated Creatinine Clearance: 49.5 mL/min (by C-G formula based on SCr of 0.75 mg/dL).  n  Assessment: 79 year old female admitted with angina on 3/11 and s/p CABG on 3/12 who had acute stroke symptoms on 3/15 AM. CTA of Head and Neck showed an acute MCA infarct and bilateral subsegmental acute pulmonary emboli. She is now s/p IR guided mechanical thrombectomy and Pharmacy has been consulted for enoxaparin dosing on 3/22. -hg= 9.8, SCr= 0.75, wt= 58kg  Spoke with Myron Roddenberry:  Plans are for another swallow evaluation and then to start oral anticoagulation if no concerns noted  Goal of Therapy:  Anti-Xa level 0.6-1 units/ml 4hrs after LMWH dose given if levels checked Monitor platelets by anticoagulation protocol: Yes   Plan:  -Change lovenox to 60mg  Maramec q12h -CBC every three  days  Hildred Laser, PharmD Clinical Pharmacist **Pharmacist phone directory can now be found on [email or web address] ([email or web address]).  Listed under [place].

## 2020-02-15 NOTE — Progress Notes (Signed)
CARDIAC REHAB PHASE I   PRE:  Rate/Rhythm: 75 SR  BP:  Sitting: 126/57      SaO2: 98 RA  MODE:  Ambulation: 60 ft   POST:  Rate/Rhythm: 99 SR  BP:  Sitting: 138/92    SaO2: 97 RA  Pt helped to [facility] then ambulated 44ft in hallway assist of one with front wheel walker. Pt c/o dizziness and weakness. Pt returned to recliner, BP and O2 sats stable. Pt voices concerns over weight loss. RN and PA made aware. Encouraged continued ambulation as able. Will continue to follow.  [id number] Rufina Falco, RN BSN 02/15/2020 9:27 AM

## 2020-02-16 LAB — BASIC METABOLIC PANEL
Anion gap: 8 (ref 5–15)
BUN: 19 mg/dL (ref 8–23)
CO2: 26 mmol/L (ref 22–32)
Calcium: 9.6 mg/dL (ref 8.9–10.3)
Chloride: 103 mmol/L (ref 98–111)
Creatinine, Ser: 0.77 mg/dL (ref 0.44–1.00)
GFR calc Af Amer: >60 mL/min (ref >60)
GFR calc non Af Amer: >60 mL/min (ref >60)
Glucose, Bld: 126 mg/dL — ABNORMAL HIGH (ref 70–99)
Potassium: 4.6 mmol/L (ref 3.5–5.1)
Sodium: 137 mmol/L (ref 135–145)

## 2020-02-16 LAB — GLUCOSE, CAPILLARY
Glucose-Capillary: 111 mg/dL — ABNORMAL HIGH (ref 70–99)
Glucose-Capillary: 123 mg/dL — ABNORMAL HIGH (ref 70–99)
Glucose-Capillary: 93 mg/dL (ref 70–99)
Glucose-Capillary: 119 mg/dL — ABNORMAL HIGH (ref 70–99)
Glucose-Capillary: 118 mg/dL — ABNORMAL HIGH (ref 70–99)

## 2020-02-16 LAB — CBC
HCT: 27.1 % — ABNORMAL LOW (ref 36.0–46.0)
Hemoglobin: 8.6 g/dL — ABNORMAL LOW (ref 12.0–15.0)
MCH: 28.9 pg (ref 26.0–34.0)
MCHC: 31.7 g/dL (ref 30.0–36.0)
MCV: 90.9 fL (ref 80.0–100.0)
Platelets: 354 10*3/uL (ref 150–400)
RBC: 2.98 MIL/uL — ABNORMAL LOW (ref 3.87–5.11)
RDW: 14.8 % (ref 11.5–15.5)
WBC: 7.7 10*3/uL (ref 4.0–10.5)
nRBC: 0 % (ref 0.0–0.2)

## 2020-02-16 NOTE — Progress Notes (Signed)
CARDIAC REHAB PHASE I   PRE:  Rate/Rhythm: 8 SR  BP:  Sitting: 105/72      SaO2: 100 RA  MODE:  Ambulation: 220 ft   POST:  Rate/Rhythm: 86 SR  BP:  Sitting: 118/56    SaO2: 98 RA  Pt ambulated 256ft in hallway assist of one with front wheel walker. Pt denies dizziness, SOB, or pain. Pt returned to recliner. Call bell and bedside table within reach. Pt seems to be brighter today, happy with cuff downsizing. Provided support and encouragement. Will continue to follow.  [id number] Rufina Falco, RN BSN 02/16/2020 1:10 PM

## 2020-02-16 NOTE — Progress Notes (Signed)
Physical Therapy Treatment Patient Details Name: [name] [name] MRN: [id number] DOB: 1941-08-18 Today's Date: 02/16/2020    History of Present Illness Pt is a 79 y.o. female admitted 01/26/20 after cardiac cath showed severe vessel disease; s/p CABG x4 on 3/13. 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 Pt with bilateral PEs. Taken to IR and found to have L M1/MCA occlusion, s/p emergent mechanical thrombectomy. ETT 3/12-3/13 (for procedure), 3/15-3/19. S/p trach 3/19. PMH includes CAD, HTN, THA, osteoporosis.    PT Comments    Pt agreeable to therapy, smiling occasionally at jokes; remains aphasic. Requiring min assist for transfers, ambulating 100 feet at a min guard assist level with a walker. HR peak 89 bpm, SpO2 100% on RA. Rest of session focused on serial sit to stands for functional lower extremity strengthening. Pt continues to require cueing for maintenance of sternal precautions. Continue to recommend SNF for ongoing Physical Therapy.      Follow Up Recommendations  SNF     Equipment Recommendations  3in1 (PT);Rolling walker with 5" wheels    Recommendations for Other Services       Precautions / Restrictions Precautions Precautions: Sternal Precaution Booklet Issued: No Precaution Comments: PMSV; trach; Cortrak Restrictions Weight Bearing Restrictions: No    Mobility  Bed Mobility Overal bed mobility: Needs Assistance Bed Mobility: Supine to Sit     Supine to sit: Supervision        Transfers Overall transfer level: Needs assistance Equipment used: Rolling walker (2 wheeled)   Sit to Stand: Min assist         General transfer comment: Cues for putting hands on knees prior to transition, minA to boost up  Ambulation/Gait Ambulation/Gait assistance: Min guard Gait Distance (Feet): 100 Feet Assistive device: Rolling walker (2 wheeled) Gait Pattern/deviations:  Decreased stride length;Trunk flexed Gait velocity: decreased   General Gait Details: Cues for scapular retraction, min guard for stability   Stairs             Wheelchair Mobility    Modified Rankin (Stroke Patients Only) Modified Rankin (Stroke Patients Only) Pre-Morbid Rankin Score: No symptoms Modified Rankin: Moderately severe disability     Balance Overall balance assessment: Needs assistance Sitting-balance support: Feet supported Sitting balance-Leahy Scale: Fair     Standing balance support: No upper extremity supported;During functional activity Standing balance-Leahy Scale: Fair                              Cognition Arousal/Alertness: Awake/alert Behavior During Therapy: WFL for tasks assessed/performed Overall Cognitive Status: Impaired/Different from baseline Area of Impairment: Memory;Awareness;Problem solving                     Memory: Decreased short-term memory;Decreased recall of precautions   Safety/Judgement: Decreased awareness of deficits   Problem Solving: Slow processing General Comments: Pt aphasic, needs cues for maintaining precautions.       Exercises Other Exercises Other Exercises: Serial sit to stands x 3    General Comments        Pertinent Vitals/Pain Pain Assessment: No/denies pain    Home Living                      Prior Function            PT Goals (current goals can now be found in the care  plan section) Acute Rehab PT Goals Patient Stated Goal: to return to independence Potential to Achieve Goals: Good Progress towards PT goals: Progressing toward goals    Frequency    Min 3X/week      PT Plan Current plan remains appropriate    Co-evaluation              AM-PAC PT "6 Clicks" Mobility   Outcome Measure  Help needed turning from your back to your side while in a flat bed without using bedrails?: None Help needed moving from lying on your back to sitting on  the side of a flat bed without using bedrails?: None Help needed moving to and from a bed to a chair (including a wheelchair)?: A Little Help needed standing up from a chair using your arms (e.g., wheelchair or bedside chair)?: A Little Help needed to walk in hospital room?: A Little Help needed climbing 3-5 steps with a railing? : A Lot 6 Click Score: 19    End of Session Equipment Utilized During Treatment: Gait belt Activity Tolerance: Patient tolerated treatment well Patient left: in chair;with call bell/phone within reach;with chair alarm set Nurse Communication: Mobility status PT Visit Diagnosis: Other abnormalities of gait and mobility (R26.89);Hemiplegia and hemiparesis Hemiplegia - Right/Left: Right Hemiplegia - dominant/non-dominant: Dominant Hemiplegia - caused by: Cerebral infarction     Time: [id number] PT Time Calculation (min) (ACUTE ONLY): 22 min  Charges:  $Gait Training: 8-22 mins                       Wyona Almas, PT, DPT Acute Rehabilitation Services Pager [phone] Office [phone]    Deno Etienne 02/16/2020, 5:24 PM

## 2020-02-16 NOTE — Progress Notes (Signed)
17 Days Post-Op Procedure(s) (LRB): IR WITH ANESTHESIA (N/A) Subjective:  Late entry, Pt. Seen at 0700 this morning.  Awake and alert. Strong voice with PMV in place. No new concerns.   Objective: Vital signs in last 24 hours: Temp:  [97.5 F (36.4 C)-98.1 F (36.7 C)] 97.9 F (36.6 C) (04/01 1110) Pulse Rate:  [60-74] 60 (04/01 1110) Cardiac Rhythm: Sinus bradycardia (04/01 1041) Resp:  [18-21] 20 (04/01 1110) BP: (110-137)/(50-57) 118/50 (04/01 1110) SpO2:  [95 %-98 %] 98 % (04/01 1110) FiO2 (%):  [21 %] 21 % (04/01 0320) Weight:  [64.2 kg] 64.2 kg (04/01 0231)    Intake/Output from previous day: 03/31 0701 - 04/01 0700 In: 1212 [NG/GT:1212] Out: 450 [Urine:450] Intake/Output this shift: No intake/output data recorded.  Physical Exam: General appearance:alert, cooperative andnodistress Neurologic:More spontaneous with conversation, continues to have trouble with word finding. Progressing with mobility. RUE strength improving. Heart:NSR, no arrhythmias Lungs:Breath sounds clear. Minimal secretions. Trach site is dry.Trach downsized to 4 cuffless by RT today.  Abdomen:soft and non-tender. Extremities:all warm and well perfused. Left arm incision is intact and dry. Wound:the sternal incision is intact and dry.PICC insertion site dry  Lab Results: Recent Labs    02/14/20 0336 02/16/20 0725  WBC 9.9 7.7  HGB 9.8* 8.6*  HCT 31.6* 27.1*  PLT 421* 354   BMET:  Recent Labs    02/14/20 0336 02/16/20 0725  NA 138 137  K 4.5 4.6  CL 104 103  CO2 25 26  GLUCOSE 102* 126*  BUN 21 19  CREATININE 0.75 0.77  CALCIUM 9.9 9.6    PT/INR: No results for input(s): LABPROT, INR in the last 72 hours. ABG    Component Value Date/Time   PHART 7.427 02/02/2020 1735   HCO3 28.5 (H) 02/02/2020 1735   TCO2 30 02/02/2020 1735   ACIDBASEDEF 2.0 01/28/2020 1545   O2SAT 100.0 02/02/2020 1735   CBG (last 3)  Recent Labs    02/16/20 0420 02/16/20 0801  02/16/20 1137  GLUCAP 111* 123* 93    Assessment/Plan:  POD20CABG x 4 with all arterial grafts for severe MVCAD and 70% left main coronary stenosis. Pre-op EF 35% by echo and 55% by LV-gram at cath. On ASA, atorvastatin, metoprolol, lisinopril, and isosorbide (for radial graft patency). Stable cardiac rhythm and acceptable blood pressure.  1.Post-op left MCA CVA on POD-3 with subsequent thrombectomy / recanalization of occluded M1/MCA by IR.Making progress with mobility andspeech.Continue PT/OT/ST.MBS on 3/26 demonstrated significant deficits in swallow function and high risk for aspiration.Repeat swallow eval planned for tomorrow by SLP. Not a candidate for CIR due to lack of in-home supervision post discharge. Will need SNF for rehab.Hoping she will progress to the point of not requiring a feeding tube prior to discharge.  Appreciate the efforts of the multiple therapists participating in her care.  2.Post-op Seizure- likely secondary to above. No apparent subsequent seizures.  3.Bilateral pulmonary emboli post-op- Initially treated with heparin and now on Lovenox therapy that is being dosed by pharmacy. TEE demonstrated a PFO with L->R shunt, no residual LAA thrombus.  4.Post-op respiratory failure- required re-intubation and eventual trach and subsequently has had steady improvement.Now on RA with excellent O2 saturations and no evidence of distress.Has had PMV in place past 3 days. Secretionsminimal.Trach mgt per CCM, downsized to 4 cuffless trach today.  5.Expected acute blood loss anemia- Hct 28% today.   6.History of thyroid cancer, s/p thyroidectomy- on appropriate replacement therapy  7. 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 Zoloft started on 3/31 at 25mg  po daily. Will monitor for effect and will titrate as needed.    LOS: 20 days        LOS: 21 days    Antony Odea, PA-C [phone] 02/16/2020

## 2020-02-16 NOTE — Progress Notes (Signed)
Nutrition Follow-up  DOCUMENTATION CODES:   Not applicable  INTERVENTION:   Tube Feeding via Cortrak:  Continue Osmolite 1.2 at 60 ml/hr Provides 1728 kcals, 80 g of protein and 1166 mL of free water Meets 100% of estimated calorie and protein needs  Await diet advancement; further recommendations to follow  NUTRITION DIAGNOSIS:   Inadequate oral intake related to inability to eat as evidenced by NPO status.  Being addressed via TF   GOAL:   Patient will meet greater than or equal to 90% of their needs  Progressing  MONITOR:   Vent status, I & O's  REASON FOR ASSESSMENT:   Ventilator    ASSESSMENT:   Pt with PMH of HTN, HLD, and thyroid cancer admitted 3/11 for chest pain found to have 4-vessel CAD and underwent CABG 3/13.   3/11 Admitted 3/13 CABG x4 3/15 L MCA infarct with left M1 occlusion s/p thromecbtomy, Intubated 3/16 Seizures, started on Keppra 3/17 Cortrak placed 3/19 Trach placed 3/20 Trach collar 3/24- changed to cuffless  Secretions improved. Trach downsized. Tolerating tube feeding at goal rate. Doing well with PMV. Plan MBS tomorrow.   Continue tube feeding to meet 100% of needs until diet is advanced and PO intake progresses.   Admission weight: 65.3 kg  Current weight: 64.2 kg   I/O: -1,525 ml since 3/18 UOP: 450 ml x 24 hrs   Medications: dulcolax, colace, SS novolog Labs: CBG 93-125  Diet Order:   Diet Order    None      EDUCATION NEEDS:   No education needs have been identified at this time  Skin:  Skin Assessment: Skin Integrity Issues: Skin Integrity Issues:: Incisions Incisions: L hand, chest  Last BM:  4/1  Height:   Ht Readings from Last 1 Encounters:  02/08/20 5\' 3"  (1.6 m)    Weight:   Wt Readings from Last 1 Encounters:  02/16/20 64.2 kg    Ideal Body Weight:  52.2 kg  BMI:  Body mass index is 25.07 kg/m.  Estimated Nutritional Needs:   Kcal:  1650-1850 kcals  Protein:  80-90g  Fluid:  >/=  1.7 L   Mariana Single RD, LDN Clinical Nutrition Pager listed in [place]

## 2020-02-16 NOTE — Progress Notes (Signed)
  Speech Language Pathology Treatment: Cognitive-Linquistic  Patient Details Name: [name] [name] MRN: [id number] DOB: 1941-03-20 Today's Date: 02/16/2020 Time: [id number] SLP Time Calculation (min) (ACUTE ONLY): 30 min  Assessment / Plan / Recommendation Clinical Impression  Pt seen for aphasia therapy.  Trach changed to #4 cuffless today.  PMV in place.  Pt's voice quality is excellent.  Pt named items to confrontation today initially with 20% accuracy; improved to 80% with task repetition and initial sound cueing.  Progressed to naming item and expanding utterance to include function (eg, "use glasses to see") with verbal cues on 50% of trials. Pt pleased with progress and with her improving communication.  We discussed swallowing status.  Recommend repeating MBS tomorrow, 4/2.  Pt agrees with plan.   HPI HPI:  79 y.o. female admitted 01/26/20 after cardiac cath showed severe vessel disease; s/p CABG x4 on 3/13. 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 Pt with bilateral PEs. Taken to IR and found to have L M1/MCA occlusion, s/p emergent mechanical thrombectomy. ETT 3/12-3/13 (for procedure), 3/15-3/19. S/p trach 3/19. PMH includes CAD, HTN, THA, osteoporosis.  MBS 3/26 with severe dysphagia and aspiration.        SLP Plan  Continue with current plan of care;MBS       Recommendations  Diet recommendations: NPO(ice chips only)      Patient may use Passy-Muir Speech Valve: Intermittently with supervision PMSV Supervision: Intermittent         Oral Care Recommendations: Oral care QID Follow up Recommendations: Inpatient Rehab SLP Visit Diagnosis: Aphasia (R47.01) Plan: Continue with current plan of care;MBS       GO               Percy Comp L. Tivis Ringer, [place] Office number [phone] Pager [phone]  [name] [name] 02/16/2020, 1:47 PM

## 2020-02-16 NOTE — Procedures (Signed)
Bedside Tracheostomy Insertion Procedure Note   Patient Details:   Name: [name] [name] DOB: 1941-07-26 MRN: [id number]  Procedure: Tracheostomy  Pre Procedure Assessment: ET Tube Size: ET Tube secured at lip (cm): Bite block in place: No Breath Sounds: Clear  Post Procedure Assessment: BP (!) 118/50 (BP Location: Left Leg)   Pulse 60   Temp 97.9 F (36.6 C) (Oral)   Resp 20   Ht 5\' 3"  (1.6 m)   Wt 64.2 kg   SpO2 98%   BMI 25.07 kg/m  O2 sats: stable throughout Complications: No apparent complications Patient did tolerate procedure well Tracheostomy Brand:Shiley Tracheostomy Style:Uncuffed Tracheostomy Size: 4 Tracheostomy Secured [id number] Tracheostomy Placement Confirmation:Trach cuff visualized and in place   Trach changed no noted issues. ETCO2 and bilateral BS used for trach placement.  Rudene Re 02/16/2020, 11:43 AM

## 2020-02-16 NOTE — Progress Notes (Signed)
NAME:  [name] [name], MRN:  [id number], DOB:  18-Mar-1941, LOS: 21 ADMISSION DATE:  01/26/2020, CONSULTATION DATE:  01/30/2020 REFERRING MD:  Rory Percy CHIEF COMPLAINT:  Right hemiplegia   Brief History        This 79 year old was admitted on 3/8 for evaluation of chest pain.  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  She was up ambulating  and suddenly developed a right hemiplegia.  CTA showed an M1 occlusion and thrombectomy was performed.  An attempt was made to extubate the patient post procedure but this was not tolerated.  Of note at the time the CTA was performed she was also found to have pulmonary emboli.  History of present illness       History is as noted above with the following additions.  Intraoperative TEE showed essentially normal LV systolic dysfunction there was some LVH and a PFO with a left to right flow.  There is no thrombus noted in the left atrial appendage.  Past Medical History  Thyroid cancer, hypertension, hyperlipidemia.  Significant Hospital Events   Cardiac cath 3/12, CABG 3/13 3/15: Acute stroke with right-sided hemiplegia, also found to have acute pulmonary emboli 3/16 had seizure placed on Keppra 3/18 extubated, had upper airway edema, reintubated, noted to have significant airway swelling 3/19 tracheostomy placed at bedside 320 through 3/23: Tolerating aerosol trach collar, tolerating 28% is having bloody tracheal secretions 3/24: Changed to cuffless trach at bedside moved to stepdown unit 3/29: Has Passy-Muir valve in place, strong cough Consults:  Thoracic surgery, neurology, interventional radiology.  Procedures:  Four-vessel CABG, left M1 thrombectomy 3/19 trach  Significant Diagnostic Tests:  3/15 LE venous doppler:  RIGHT:  - There is no evidence of deep vein thrombosis in the lower extremity.    - No cystic structure found in the popliteal fossa.    LEFT:  - Findings  consistent with acute deep vein thrombosis involving the left  posterior tibial veins.  - No cystic structure found in the popliteal fossa.  3/15 echo: LVEF [id number], grade 1 diastolic dysfunction, LA dilation, RVSP mildly elevated 3/16 cth: No acute intracranial hemorrhage. Likely evolving recent infarction of the lentiform nucleus. 3/15 cta head/neck 1. Emergent large vessel occlusion at the left M1 segment. 2. Aspects of 10 with CT perfusion suggesting core infarct of 74 cc and 85 cc of penumbra. 3. Flow reducing left ICA bulb stenosis. 4. Bilateral subsegmental acute pulmonary emboli. 5. Proximal occlusion of the non dominant left vertebral artery with reconstitution at the dura. 3/16 eeg: This study showed evidence of epileptogenicity arising from left frontotemporal as well as cortical dysfunction in left hemisphere likely secondary to underlying stroke. No seizures were seen throughout the recording.  Micro Data:  3/8 sars2: neg  Antimicrobials:  Vancomycin and Zosyn started 3/25, discontinued 3/26 Ceftriaxone started 3/26, to get 4 total doses of this-today will be day 4  Interim history/subjective:  Feeling better today No overnight events Not being suctioned for much  Objective   Blood pressure (!) 118/54, pulse 69, temperature (!) 97.5 F (36.4 C), temperature source Oral, resp. rate 20, height 5\' 3"  (1.6 m), weight 64.2 kg, SpO2 97 %.    FiO2 (%):  [21 %] 21 %   Intake/Output Summary (Last 24 hours) at 02/16/2020 0849 Last data filed at 02/16/2020 0427 Gross per 24 hour  Intake 1212 ml  Output 450 ml  Net 762 ml   [employer]  02/15/20 0629 02/15/20 0900 02/16/20 0231  Weight: 54.7 kg 58.2 kg 64.2 kg    Examination:  Flat affect, comfortable with oxygen supplementation in place HEENT PMV in place, 6 cuffless trach   Pulmonary: Clear breath sounds Cardiac regular rate and rhythm  Resolved Hospital Problem list     Assessment & Plan:   Acute L MCA CVA:  Status post thrombectomy complicated by seizure Acute Pulmonary emboli-on anticoagulation Acute hypoxic Respiratory failure now tracheostomy dependent following failed extubation due to upper airway obstruction from vocal cord edema.  Tolerating PMV, voice is stronger, cough is improving Status post coronary artery bypass grafting on 3/13 combined HF, htn:  TEE with PFO -LVEF 30-35% with grade 1 diastolic dysfunction Acute L LE DVT Hypothyroidism   Pulmonary problem list  Tracheobronchitis been treated Tracheostomy secondary to upper airway obstruction  Data:  -s/p trach 3/19, changed to cuffless 3/24-tolerating this well with PMV in place -Completed ceftriaxone -We will change to size 4 trach tube  Discussion: Secretions improving Work of breathing is well-tolerated PMV well-tolerated Speech pathology continues to follow Affect is very flat today -Discussed adding antidepressants, she will rather not at present  Plan: Downsize to 4 cuffless trach  Inspection of the vocal cords may be needed though, appears to have a very strong cough with good phonation at present  Sherrilyn Rist, MD [facility] [facility] Pager: [phone]

## 2020-02-17 ENCOUNTER — Inpatient Hospital Stay (HOSPITAL_COMMUNITY): Payer: PPO

## 2020-02-17 LAB — CBC
HCT: 27 % — ABNORMAL LOW (ref 36.0–46.0)
Hemoglobin: 8.5 g/dL — ABNORMAL LOW (ref 12.0–15.0)
MCH: 29 pg (ref 26.0–34.0)
MCHC: 31.5 g/dL (ref 30.0–36.0)
MCV: 92.2 fL (ref 80.0–100.0)
Platelets: 354 10*3/uL (ref 150–400)
RBC: 2.93 MIL/uL — ABNORMAL LOW (ref 3.87–5.11)
RDW: 15 % (ref 11.5–15.5)
WBC: 7.7 10*3/uL (ref 4.0–10.5)
nRBC: 0 % (ref 0.0–0.2)

## 2020-02-17 LAB — GLUCOSE, CAPILLARY
Glucose-Capillary: 111 mg/dL — ABNORMAL HIGH (ref 70–99)
Glucose-Capillary: 115 mg/dL — ABNORMAL HIGH (ref 70–99)
Glucose-Capillary: 120 mg/dL — ABNORMAL HIGH (ref 70–99)
Glucose-Capillary: 127 mg/dL — ABNORMAL HIGH (ref 70–99)
Glucose-Capillary: 95 mg/dL (ref 70–99)
Glucose-Capillary: 96 mg/dL (ref 70–99)

## 2020-02-17 MED ORDER — PANTOPRAZOLE SODIUM 40 MG PO PACK
40.0000 mg | PACK | Freq: Every day | ORAL | Status: DC
  Filled 2020-02-17 (×3): qty 20

## 2020-02-17 MED ORDER — SERTRALINE HCL 50 MG PO TABS
50.0000 mg | ORAL_TABLET | Freq: Every day | ORAL | Status: DC
  Administered 2020-02-17 – 2020-02-20 (×4): 50 mg via ORAL
  Filled 2020-02-17 (×4): qty 1

## 2020-02-17 MED ORDER — LISINOPRIL 40 MG PO TABS
40.0000 mg | ORAL_TABLET | Freq: Every day | ORAL | Status: DC
  Administered 2020-02-18: 09:00:00 40 mg via ORAL
  Filled 2020-02-17 (×2): qty 1

## 2020-02-17 MED ORDER — APIXABAN 5 MG PO TABS
5.0000 mg | ORAL_TABLET | Freq: Two times a day (BID) | ORAL | Status: DC
  Administered 2020-02-17 – 2020-02-21 (×8): 5 mg via ORAL
  Filled 2020-02-17 (×8): qty 1

## 2020-02-17 MED ORDER — SERTRALINE HCL 50 MG PO TABS: 50.0000 mg | ORAL_TABLET | Freq: Every day | ORAL | Status: DC

## 2020-02-17 MED ORDER — ISOSORBIDE MONONITRATE ER 30 MG PO TB24
30.0000 mg | ORAL_TABLET | Freq: Every day | ORAL | Status: DC
  Administered 2020-02-18 – 2020-02-19 (×2): 30 mg via ORAL
  Filled 2020-02-17 (×2): qty 1

## 2020-02-17 MED ORDER — METOPROLOL TARTRATE 25 MG PO TABS
25.0000 mg | ORAL_TABLET | Freq: Two times a day (BID) | ORAL | Status: DC
  Administered 2020-02-17 – 2020-02-21 (×7): 25 mg via ORAL
  Filled 2020-02-17 (×8): qty 1

## 2020-02-17 MED ORDER — LEVOTHYROXINE SODIUM 200 MCG PO TABS
100.0000 ug | ORAL_TABLET | ORAL | Status: DC
  Administered 2020-02-20: 100 ug via ORAL
  Filled 2020-02-17 (×2): qty 0.5

## 2020-02-17 MED ORDER — DOCUSATE SODIUM 50 MG/5ML PO LIQD
200.0000 mg | Freq: Every day | ORAL | Status: DC
  Administered 2020-02-18: 200 mg via ORAL
  Filled 2020-02-17 (×3): qty 20

## 2020-02-17 MED ORDER — LEVOTHYROXINE SODIUM 200 MCG PO TABS
200.0000 ug | ORAL_TABLET | ORAL | Status: DC
  Administered 2020-02-18 – 2020-02-21 (×3): 200 ug via ORAL
  Filled 2020-02-17 (×3): qty 1

## 2020-02-17 MED ORDER — FERROUS SULFATE 300 (60 FE) MG/5ML PO SYRP
220.0000 mg | ORAL_SOLUTION | Freq: Two times a day (BID) | ORAL | Status: DC
  Administered 2020-02-17: 220 mg
  Filled 2020-02-17 (×2): qty 5

## 2020-02-17 MED ORDER — FE FUMARATE-B12-VIT C-FA-IFC PO CAPS
1.0000 | ORAL_CAPSULE | Freq: Three times a day (TID) | ORAL | Status: DC
  Administered 2020-02-17 – 2020-02-21 (×11): 1 via ORAL
  Filled 2020-02-17 (×14): qty 1

## 2020-02-17 NOTE — Progress Notes (Signed)
NAME:  [name] [name], MRN:  [id number], DOB:  [birth year], LOS: 36 ADMISSION DATE:  01/26/2020, CONSULTATION DATE:  01/30/2020 REFERRING MD:  Rory Percy CHIEF COMPLAINT:  Right hemiplegia   Brief History        This 79 year old was admitted on 3/8 for evaluation of chest pain.  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  She was up ambulating  and suddenly developed a right hemiplegia.  CTA showed an M1 occlusion and thrombectomy was performed.  An attempt was made to extubate the patient post procedure but this was not tolerated.  Of note at the time the CTA was performed she was also found to have pulmonary emboli.  History of present illness       History is as noted above with the following additions.  Intraoperative TEE showed essentially normal LV systolic dysfunction there was some LVH and a PFO with a left to right flow.  There is no thrombus noted in the left atrial appendage.  Past Medical History  Thyroid cancer, hypertension, hyperlipidemia.  Significant Hospital Events   Cardiac cath 3/12, CABG 3/13 3/15: Acute stroke with right-sided hemiplegia, also found to have acute pulmonary emboli 3/16 had seizure placed on Keppra 3/18 extubated, had upper airway edema, reintubated, noted to have significant airway swelling 3/19 tracheostomy placed at bedside 320 through 3/23: Tolerating aerosol trach collar, tolerating 28% is having bloody tracheal secretions 3/24: Changed to cuffless trach at bedside moved to stepdown unit 3/29: Has Passy-Muir valve in place, strong cough Consults:  Thoracic surgery, neurology, interventional radiology.  Procedures:  Four-vessel CABG, left M1 thrombectomy 3/19 trach  Significant Diagnostic Tests:  3/15 LE venous doppler:  RIGHT:  - There is no evidence of deep vein thrombosis in the lower extremity.    - No cystic structure found in the popliteal fossa.    LEFT:  - Findings  consistent with acute deep vein thrombosis involving the left  posterior tibial veins.  - No cystic structure found in the popliteal fossa.  3/15 echo: LVEF [id number], grade 1 diastolic dysfunction, LA dilation, RVSP mildly elevated 3/16 cth: No acute intracranial hemorrhage. Likely evolving recent infarction of the lentiform nucleus. 3/15 cta head/neck 1. Emergent large vessel occlusion at the left M1 segment. 2. Aspects of 10 with CT perfusion suggesting core infarct of 74 cc and 85 cc of penumbra. 3. Flow reducing left ICA bulb stenosis. 4. Bilateral subsegmental acute pulmonary emboli. 5. Proximal occlusion of the non dominant left vertebral artery with reconstitution at the dura. 3/16 eeg: This study showed evidence of epileptogenicity arising from left frontotemporal as well as cortical dysfunction in left hemisphere likely secondary to underlying stroke. No seizures were seen throughout the recording.  Micro Data:  3/8 sars2: neg  Antimicrobials:  Vancomycin and Zosyn started 3/25, discontinued 3/26 Ceftriaxone started 3/26, to get 4 total doses of this-today will be day 4  Interim history/subjective:  Feeling better today No overnight events Not being suctioned for much  Objective   Blood pressure (!) 90/56, pulse 63, temperature 97.8 F (36.6 C), temperature source Oral, resp. rate 18, height 5\' 3"  (1.6 m), weight 64.2 kg, SpO2 97 %.    FiO2 (%):  [21 %] 21 %   Intake/Output Summary (Last 24 hours) at 02/17/2020 1150 Last data filed at 02/16/2020 1500 Gross per 24 hour  Intake 0 ml  Output --  Net 0 ml   [employer]  02/15/20 0900 02/16/20 0231 02/17/20 0411  Weight: 58.2 kg 64.2 kg 64.2 kg    Examination:   Overall improved HEENT PMV in place, 4 cuffless trach Pulmonary: Clear breath sounds Cardiac regular rate and rhythm  Resolved Hospital Problem list     Assessment & Plan:   Acute L MCA CVA: Status post thrombectomy complicated by seizure Acute  Pulmonary emboli-on anticoagulation Acute hypoxic Respiratory failure now tracheostomy dependent following failed extubation due to upper airway obstruction from vocal cord edema.  Tolerating PMV, voice is stronger, cough is improving Status post coronary artery bypass grafting on 3/13 combined HF, htn:  TEE with PFO -LVEF 30-35% with grade 1 diastolic dysfunction Acute L LE DVT Hypothyroidism   Pulmonary problem list  Tracheobronchitis been treated Tracheostomy secondary to upper airway obstruction  Data:  -s/p trach 3/19, changed to cuffless 3/24-tolerating this well with PMV in place -Completed ceftriaxone -We will CAP Trach today  Discussion: No significant secretions Work of breathing is well-tolerated Tube change well-tolerated Speech pathology continues to follow Affect is very flat today -Discussed adding antidepressants, she will rather not at present  Plan: CAP Trach -May decannulate in 24 hours if well tolerated   Sherrilyn Rist, MD [facility] [place] [facility] Pager: [phone]

## 2020-02-17 NOTE — Progress Notes (Signed)
CARDIAC REHAB PHASE I   PRE:  Rate/Rhythm: 25 SR  BP:  Sitting: 138/64      SaO2: 94 RA  MODE:  Ambulation: 200 ft   POST:  Rate/Rhythm: 97 SR  BP:  Sitting: 118/55    SaO2: 95 RA  Pt ambulated 275ft in hallway assist of one with front wheel walker. Pt denies pain, SOB, or dizziness. Pt returned to bed to go for a swallow study. Will continue to follow and encourage ambulation.  [id number] Rufina Falco, RN BSN 02/17/2020 9:58 AM

## 2020-02-17 NOTE — Progress Notes (Signed)
ANTICOAGULATION CONSULT NOTE - [place] for enoxaparin>> apixaban Indication: pulmonary embolus, stroke and DVT, and recent CABG  Allergies  Allergen Reactions  . Bee Venom Swelling    Massive swelling  . Penicillins Other (See Comments)    ** tolerates Zosyn + cephalosporins Did it involve swelling of the face/tongue/throat, SOB, or low BP? Unknown Did it involve sudden or severe rash/hives, skin peeling, or any reaction on the inside of your mouth or nose? Unknown Did you need to seek medical attention at a hospital or doctor's office? Unknown When did it last happen?1945 If all above answers are "NO", may proceed with cephalosporin use.  . Adhesive [Tape] Rash  . Morphine And Related Rash  . Sulfa Antibiotics Nausea And Vomiting and Rash    Patient Measurements: Height: 5\' 3"  (160 cm) Weight: 64.2 kg (141 lb 8.6 oz) IBW/kg (Calculated) : 52.4  Vital Signs: Temp: 97.8 F (36.6 C) (04/02 0810) Temp Source: Oral (04/02 1108) BP: 90/56 (04/02 1108) Pulse Rate: 63 (04/02 1108)  Labs: Recent Labs    02/16/20 0725 02/17/20 0551  HGB 8.6* 8.5*  HCT 27.1* 27.0*  PLT 354 354  CREATININE 0.77  --     Estimated Creatinine Clearance: 53.9 mL/min (by C-G formula based on SCr of 0.77 mg/dL).  n  Assessment: 79 year old female admitted with angina on 3/11 and s/p CABG on 3/12 who had acute stroke symptoms on 3/15 AM. CTA of Head and Neck showed an acute MCA infarct and bilateral subsegmental acute pulmonary emboli. She is now s/p IR guided mechanical thrombectomy and Pharmacy has been consulted for enoxaparin dosing on 3/22. -hg=8.58, SCr= 0.7, wt= 64kg  She passed her swallow evaluation today and plans are now to change to apixaban. Last dose of lovenox was 4/2 and 4am -she has been on enoxaparin since 3/22 and will not need the apixaban load (discussed with Enid Cutter PA).   Goal of Therapy:  Monitor platelets by anticoagulation  protocol: Yes   Plan:  -Begin apixaban 5mg  po bid tonight. -Will provide education  Hildred Laser, PharmD Clinical Pharmacist **Pharmacist phone directory can now be found on [email or web address] ([email or web address]).  Listed under [place].

## 2020-02-17 NOTE — Progress Notes (Signed)
RT capped patient's trach per MD order. Patient is tolerating well at this time. Vital signs stable. RT will continue to monitor.

## 2020-02-17 NOTE — Progress Notes (Signed)
Cortrak removed today per MD order.  Pt tolerated well.  No complications to report at this time.  Pt will continue to be monitored.

## 2020-02-17 NOTE — Progress Notes (Signed)
Physical Therapy Treatment Patient Details Name: [name] [name] MRN: [id number] DOB: 1941-01-04 Today's Date: 02/17/2020    History of Present Illness Pt is a 79 y.o. female admitted 01/26/20 after cardiac cath showed severe vessel disease; s/p CABG x4 on 3/13. 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 Pt with bilateral PEs. Taken to IR and found to have L M1/MCA occlusion, s/p emergent mechanical thrombectomy. ETT 3/12-3/13 (for procedure), 3/15-3/19. S/p trach 3/19. PMH includes CAD, HTN, THA, osteoporosis.    PT Comments    Patient seen for mobility progression and tolerated session well on RA. Continue to progress as tolerated with anticipated d/c to SNF for further skilled PT services.     Follow Up Recommendations  SNF     Equipment Recommendations  3in1 (PT);Rolling walker with 5" wheels    Recommendations for Other Services       Precautions / Restrictions Precautions Precautions: Sternal Precaution Comments: trach capped ; Cortrak    Mobility  Bed Mobility Overal bed mobility: Needs Assistance Bed Mobility: Supine to Sit;Sit to Supine     Supine to sit: Supervision Sit to supine: Supervision   General bed mobility comments: supervision for safety; cues for sternal precautions  Transfers Overall transfer level: Needs assistance Equipment used: Rolling walker (2 wheeled) Transfers: Sit to/from Stand Sit to Stand: Min assist         General transfer comment: cues for hand placement/technique; assist to power up   Ambulation/Gait Ambulation/Gait assistance: Min guard Gait Distance (Feet): (150 ft total with rest break due to fatigue) Assistive device: Rolling walker (2 wheeled) Gait Pattern/deviations: Decreased stride length;Trunk flexed;Step-through pattern Gait velocity: decreased   General Gait Details: cues for upright posture and maintaining safe proximity to  [employer] Mobility    Modified Rankin (Stroke Patients Only) Modified Rankin (Stroke Patients Only) Pre-Morbid Rankin Score: No symptoms Modified Rankin: Moderately severe disability     Balance Overall balance assessment: Needs assistance Sitting-balance support: Feet supported Sitting balance-Leahy Scale: Fair     Standing balance support: During functional activity;Bilateral upper extremity supported Standing balance-Leahy Scale: Poor                              Cognition Arousal/Alertness: Awake/alert Behavior During Therapy: WFL for tasks assessed/performed Overall Cognitive Status: Impaired/Different from baseline Area of Impairment: Memory;Problem solving                     Memory: Decreased short-term memory;Decreased recall of precautions       Problem Solving: Slow processing General Comments: Pt aphasic, needs cues for maintaining precautions.       Exercises      General Comments General comments (skin integrity, edema, etc.): VSS on RA       Pertinent Vitals/Pain Pain Assessment: No/denies pain    Home Living                      Prior Function            PT Goals (current goals can now be found in the care plan section) Progress towards PT goals: Progressing toward goals    Frequency    Min 3X/week      PT Plan Current plan remains appropriate  Co-evaluation              AM-PAC PT "6 Clicks" Mobility   Outcome Measure  Help needed turning from your back to your side while in a flat bed without using bedrails?: None Help needed moving from lying on your back to sitting on the side of a flat bed without using bedrails?: None Help needed moving to and from a bed to a chair (including a wheelchair)?: A Little Help needed standing up from a chair using your arms (e.g., wheelchair or bedside chair)?: A Little Help needed to walk in hospital room?: A Little Help  needed climbing 3-5 steps with a railing? : A Lot 6 Click Score: 19    End of Session Equipment Utilized During Treatment: Gait belt Activity Tolerance: Patient tolerated treatment well Patient left: with call bell/phone within reach;in bed Nurse Communication: Mobility status PT Visit Diagnosis: Other abnormalities of gait and mobility (R26.89);Hemiplegia and hemiparesis Hemiplegia - Right/Left: Right Hemiplegia - dominant/non-dominant: Dominant Hemiplegia - caused by: Cerebral infarction     Time: 1316-1340 PT Time Calculation (min) (ACUTE ONLY): 24 min  Charges:  $Gait Training: 23-37 mins                     Earney Navy, PTA Acute Rehabilitation Services Pager: [phone] Office: [phone]     Darliss Cheney 02/17/2020, 4:53 PM

## 2020-02-17 NOTE — Progress Notes (Signed)
  Results of the modified barium swallow noted. Cor Trak discontinued and regular diet ordered. Her trach has also been capped and could potentially be removed tomorrow.  Discussed switching anticoagulation from [place] enoxaparin to oral NOAC with pharmacy and will also change other medications to PO as appropriate.        Macarthur Critchley, PA-C [phone]

## 2020-02-17 NOTE — Discharge Instructions (Addendum)
We ask the SNF to please do the following: 1. Please obtain vital signs at least one time daily 2.Please weigh the patient daily. If he or she continues to gain weight or develops lower extremity edema, contact the office at [phone]. 3. Ambulate patient at least three times daily and please use sternal precautions.  Discharge Instructions:  1. You may shower, please wash incisions daily with soap and water and keep dry.  If you wish to cover wounds with dressing you may do so but please keep clean and change daily.  No tub baths or swimming until incisions have completely healed.  If your incisions become red or develop any drainage please call our office at [phone]  2. No Driving until cleared by Dr. Orvan Seen office and you are no longer using narcotic pain medications  3. Monitor your weight daily.. Please use the same scale and weigh at same time... If you gain 5-10 lbs in 48 hours with associated lower extremity swelling, please contact our office at [phone]  4. Fever of 101.5 for at least 24 hours with no source, please contact our office at [phone]  5. Activity- up as tolerated, please walk at least 3 times per day.  Avoid strenuous activity, no lifting, pushing, or pulling with your arms over 8-10 lbs for a minimum of 6 weeks  6. If any questions or concerns arise, please do not hesitate to contact our office at [phone]  ---------------------------------------------------------------- Information on my medicine - ELIQUIS (apixaban)  Why was Eliquis prescribed for you? Eliquis was prescribed to treat blood clots that may have been found in the veins of your legs (deep vein thrombosis) or in your lungs (pulmonary embolism) and to reduce the risk of them occurring again.  What do You need to know about Eliquis ? The dose one 5 mg tablet taken TWICE daily.  Eliquis may be taken with or without food.   Try to take the dose about the same time in the morning  and in the evening. If you have difficulty swallowing the tablet whole please discuss with your pharmacist how to take the medication safely.  Take Eliquis exactly as prescribed and DO NOT stop taking Eliquis without talking to the doctor who prescribed the medication.  Stopping may increase your risk of developing a new blood clot.  Refill your prescription before you run out.  After discharge, you should have regular check-up appointments with your healthcare provider that is prescribing your Eliquis.    What do you do if you miss a dose? If a dose of ELIQUIS is not taken at the scheduled time, take it as soon as possible on the same day and twice-daily administration should be resumed. The dose should not be doubled to make up for a missed dose.  Important Safety Information A possible side effect of Eliquis is bleeding. You should call your healthcare provider right away if you experience any of the following: ? Bleeding from an injury or your nose that does not stop. ? Unusual colored urine (red or dark brown) or unusual colored stools (red or black). ? Unusual bruising for unknown reasons. ? A serious fall or if you hit your head (even if there is no bleeding).  Some medicines may interact with Eliquis and might increase your risk of bleeding or clotting while on Eliquis. To help avoid this, consult your healthcare provider or pharmacist prior to using any new prescription or non-prescription medications, including herbals, vitamins, non-steroidal anti-inflammatory drugs (NSAIDs) and  supplements.  This website has more information on Eliquis (apixaban): http://www.eliquis.com/eliquis/home

## 2020-02-17 NOTE — Progress Notes (Signed)
Modified Barium Swallow Progress Note  Patient Details  Name: [name] [name] MRN: [id number] Date of Birth: 1941/08/17  Today's Date: 02/17/2020  Modified Barium Swallow completed.  Full report located under Chart Review in the Imaging Section.  Brief recommendations include the following:  Clinical Impression  Pt presents with significantly improved swallow function since last MBS on 02/10/20.  Demonstrates improved propulsion of barium through pharynx and UES with only trace residue, which resides superior to small cervical osteophyte at C6 and occasionally within valleculae.  There was consistent laryngeal vestibule closure, with penetration on fewer than 10% of trials (thin liquids) - there was only one incident of penetration to the vocal folds, which pt sensed and spontaneously ejected from larynx with a cough.  There was no observed aspiration.  Recommend that pt initiate a regular diet, thin liquids.  Cortrak no longer necessary.  Pt was extremely excited about results, repeating "I can't believe it!"  D/W RN after procedure.  No further f/u for swallowing is necessary; SLP to focus on aphasia.     Swallow Evaluation Recommendations       SLP Diet Recommendations: Regular solids;Thin liquid   Liquid Administration via: Cup;Straw   Medication Administration: Whole meds with puree   Supervision: Patient able to self feed           Oral Care Recommendations: Oral care BID      [name] L. Tivis Ringer, [place] Office number [phone] Pager [phone]   [name] [facility] 02/17/2020,12:07 PM

## 2020-02-17 NOTE — Progress Notes (Signed)
ANTICOAGULATION CONSULT NOTE - [place] for enoxaparin Indication: pulmonary embolus, stroke and DVT, and recent CABG  Allergies  Allergen Reactions  . Bee Venom Swelling    Massive swelling  . Penicillins Other (See Comments)    ** tolerates Zosyn + cephalosporins Did it involve swelling of the face/tongue/throat, SOB, or low BP? Unknown Did it involve sudden or severe rash/hives, skin peeling, or any reaction on the inside of your mouth or nose? Unknown Did you need to seek medical attention at a hospital or doctor's office? Unknown When did it last happen?1945 If all above answers are "NO", may proceed with cephalosporin use.  . Adhesive [Tape] Rash  . Morphine And Related Rash  . Sulfa Antibiotics Nausea And Vomiting and Rash    Patient Measurements: Height: 5\' 3"  (160 cm) Weight: 64.2 kg (141 lb 8.6 oz) IBW/kg (Calculated) : 52.4  Vital Signs: Temp: 97.6 F (36.4 C) (04/02 0411) Temp Source: Oral (04/02 0411) BP: 108/56 (04/02 0411) Pulse Rate: 64 (04/02 0411)  Labs: Recent Labs    02/16/20 0725 02/17/20 0551  HGB 8.6* 8.5*  HCT 27.1* 27.0*  PLT 354 354  CREATININE 0.77  --     Estimated Creatinine Clearance: 53.9 mL/min (by C-G formula based on SCr of 0.77 mg/dL).  n  Assessment: 79 year old female admitted with angina on 3/11 and s/p CABG on 3/12 who had acute stroke symptoms on 3/15 AM. CTA of Head and Neck showed an acute MCA infarct and bilateral subsegmental acute pulmonary emboli. She is now s/p IR guided mechanical thrombectomy and Pharmacy has been consulted for enoxaparin dosing on 3/22. -hg=8.58, SCr= 0.7, wt= 64kg  Plans are for MBS swallow evaluation on 4/2 then decide on oral anticoagulation.   Goal of Therapy:  Anti-Xa level 0.6-1 units/ml 4hrs after LMWH dose given if levels checked Monitor platelets by anticoagulation protocol: Yes   Plan:  -Continue lovenox to 60mg  [place] q12h -CBC every three  days  Hildred Laser, PharmD Clinical Pharmacist **Pharmacist phone directory can now be found on [email or web address] ([email or web address]).  Listed under [place].

## 2020-02-17 NOTE — Progress Notes (Signed)
18 Days Post-Op Procedure(s) (LRB): IR WITH ANESTHESIA (N/A) Subjective:  Awake and alert. She told me the plan for today was "to have the study" (MBS). Progressing with ambulation. On RA with excellent O2 sats.   Objective: Vital signs in last 24 hours: Temp:  [97.6 F (36.4 C)-97.9 F (36.6 C)] 97.8 F (36.6 C) (04/02 0810) Pulse Rate:  [60-74] 65 (04/02 0810) Cardiac Rhythm: Normal sinus rhythm (04/02 0415) Resp:  [16-25] 25 (04/02 0810) BP: (104-118)/(42-62) 110/62 (04/02 0810) SpO2:  [96 %-99 %] 99 % (04/02 0810) FiO2 (%):  [21 %] 21 % (04/02 0750) Weight:  [64.2 kg] 64.2 kg (04/02 0411)    Intake/Output from previous day: No intake/output data recorded. Intake/Output this shift: No intake/output data recorded.   Physical Exam: General appearance:alert, cooperative andnodistress Neurologic:conversational speech seems to improve daily. Progressing with mobility. RUE strength improving. Heart:NSR, no arrhythmias on monitor review Lungs:Breath sounds clear. Minimal secretions. Trach site is dry.Trach downsized to 4 cuffless by RT 02/16/20. Abdomen:soft and non-tender. Extremities:all warm and well perfused. Left arm incision is intact and dry. Wound:the sternal incision is intact and dry.PICC insertion site is clean and dry  Lab Results: Recent Labs    02/16/20 0725 02/17/20 0551  WBC 7.7 7.7  HGB 8.6* 8.5*  HCT 27.1* 27.0*  PLT 354 354   BMET:  Recent Labs    02/16/20 0725  NA 137  K 4.6  CL 103  CO2 26  GLUCOSE 126*  BUN 19  CREATININE 0.77  CALCIUM 9.6    PT/INR: No results for input(s): LABPROT, INR in the last 72 hours. 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 Pre-op EF 35% by echo and 55% by LV-gram at cath. On ASA, atorvastatin, metoprolol, lisinopril, and isosorbide (for radial graft patency). Stable cardiac rhythm and acceptable blood pressure.  1.Post-op left MCA CVA on POD-3 with subsequent thrombectomy / recanalization of occluded M1/MCA by IR.Making progress with mobility andspeech.Continue PT/OT/ST.MBS on 3/26 demonstrated significant deficits in swallow function and high risk for aspiration.Repeatswalloweval planned for today by SLP. Not a candidate for CIR due to lack of in-home supervision post discharge. Will need SNF for rehab.Hoping she will progress to the point of not requiring a feeding tube prior to discharge. Appreciate the efforts of the multiple therapists participating in her care.  2.Post-op Seizure- likely secondary to above. No apparent subsequent seizures. Keppra was discontinued.  3.Bilateral pulmonary emboli post-op- Initially treated with heparin and now on Lovenox therapy that is being dosed by pharmacy. TEE demonstrated a PFO with L->R shunt, no residual LAA thrombus.  4.Post-op respiratory failure- required re-intubation and eventual trach and subsequently has had steady improvement.Now on RA with excellent O2 saturations and no evidence of distress. Tolerating PMV. Secretionsminimal.Trach mgt per CCM, downsized to 4 cuffless trach on 02/16/20  5.Expected acute blood loss anemia- Hct 27% today and slowly trending down. Will add liquid FeSO4 220mg  per tube BID. Marland Kitchen  6.History of thyroid cancer, s/p thyroidectomy- on appropriate replacement therapy  7. 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 Zoloft started on 3/31 at 25mg  po daily. Will increase to 50mg  daily today and continue to  monitor for effect and will titrate as  needed.    LOS: 22 days    Antony Odea, [name] [phone] 02/17/2020

## 2020-02-17 NOTE — Progress Notes (Signed)
PT Cancellation Note  Patient Details Name: [name] [name] MRN: [id number] DOB: [birth year]   Cancelled Treatment:    Reason Eval/Treat Not Completed: Patient at procedure or test/unavailable pt off unit for MBS. PT will continue to follow acutely.   Earney Navy, PTA Acute Rehabilitation Services Pager: [phone] Office: [phone]   02/17/2020, 10:11 AM

## 2020-02-18 LAB — GLUCOSE, CAPILLARY
Glucose-Capillary: 88 mg/dL (ref 70–99)
Glucose-Capillary: 88 mg/dL (ref 70–99)
Glucose-Capillary: 91 mg/dL (ref 70–99)
Glucose-Capillary: 92 mg/dL (ref 70–99)
Glucose-Capillary: 96 mg/dL (ref 70–99)

## 2020-02-18 LAB — CBC
HCT: 27.8 % — ABNORMAL LOW (ref 36.0–46.0)
Hemoglobin: 8.7 g/dL — ABNORMAL LOW (ref 12.0–15.0)
MCH: 28.5 pg (ref 26.0–34.0)
MCHC: 31.3 g/dL (ref 30.0–36.0)
MCV: 91.1 fL (ref 80.0–100.0)
Platelets: 356 10*3/uL (ref 150–400)
RBC: 3.05 MIL/uL — ABNORMAL LOW (ref 3.87–5.11)
RDW: 15.2 % (ref 11.5–15.5)
WBC: 8.2 10*3/uL (ref 4.0–10.5)
nRBC: 0 % (ref 0.0–0.2)

## 2020-02-18 MED ORDER — INSULIN ASPART 100 UNIT/ML ~~LOC~~ SOLN: 0.0000 [IU] | Freq: Three times a day (TID) | SUBCUTANEOUS | Status: DC

## 2020-02-18 NOTE — Plan of Care (Signed)
  Problem: Activity: Goal: Risk for activity intolerance will decrease Outcome: Progressing   Problem: Safety: Goal: Ability to remain free from injury will improve Outcome: Progressing   Problem: Skin Integrity: Goal: Risk for impaired skin integrity will decrease Outcome: Progressing   Problem: Coping: Goal: Level of anxiety will decrease Outcome: Progressing

## 2020-02-18 NOTE — Progress Notes (Signed)
Trach removed per MD's order patient tolerated well, SATS 94% on room air, HR 65 BPM, RR 24 BPM, BP 112/55, patient is in no respiratory distress, will continue to monitor patient, RN in room and aware.

## 2020-02-18 NOTE — Progress Notes (Signed)
CARDIAC REHAB PHASE I   PRE:  Rate/Rhythm: 61 SR  BP:  Supine:   Sitting: 125/51  Standing:    SaO2: 97% RA  MODE:  Ambulation: 220 ft   POST:  Rate/Rhythm: 66 SR  BP:  Supine:   Sitting: 93/49  Standing:    SaO2: 95% RA Tolerated ambulation well with rolling walker and assistance x1.  Upon return to room she was assisted to the chair, feel elevated, call bell, phone, glasses with in reach.  Liliane Channel RN, BSN 02/18/2020 11:22 AM

## 2020-02-18 NOTE — Progress Notes (Signed)
[address] ValleySuite 411       [employer] [address]             [phone]      19 Days Post-Op Procedure(s) (LRB): IR WITH ANESTHESIA (N/A) Subjective: Trach removed this am, appears to be tolerating well   Objective: Vital signs in last 24 hours: Temp:  [97.6 F (36.4 C)-98.2 F (36.8 C)] 97.6 F (36.4 C) (04/03 0834) Pulse Rate:  [62-72] 70 (04/03 0834) Cardiac Rhythm: Normal sinus rhythm (04/03 0834) Resp:  [16-27] 20 (04/03 0834) BP: (90-125)/(46-56) 112/55 (04/03 0834) SpO2:  [96 %-100 %] 96 % (04/03 0834) FiO2 (%):  [21 %] 21 % (04/02 2325) Weight:  [64 kg] 64 kg (04/03 0303)  Hemodynamic parameters for last 24 hours:    Intake/Output from previous day: 04/02 0701 - 04/03 0700 In: 52 [P.O.:50; I.V.:20] Out: -  Intake/Output this shift: Total I/O In: 120 [P.O.:120] Out: -   General appearance: alert, cooperative and some expressive aphasia Heart: regular rate and rhythm Lungs: mildly dim in bases Abdomen: soft, nontender Extremities: no edema Wound: incis healing well  Lab Results: Recent Labs    02/17/20 0551 02/18/20 0430  WBC 7.7 8.2  HGB 8.5* 8.7*  HCT 27.0* 27.8*  PLT 354 356   BMET:  Recent Labs    02/16/20 0725  NA 137  K 4.6  CL 103  CO2 26  GLUCOSE 126*  BUN 19  CREATININE 0.77  CALCIUM 9.6    PT/INR: No results for input(s): LABPROT, INR in the last 72 hours. ABG    Component Value Date/Time   PHART 7.427 02/02/2020 1735   HCO3 28.5 (H) 02/02/2020 1735   TCO2 30 02/02/2020 1735   ACIDBASEDEF 2.0 01/28/2020 1545   O2SAT 100.0 02/02/2020 1735   CBG (last 3)  Recent Labs    02/17/20 2012 02/18/20 0009 02/18/20 0419  GLUCAP 95 88 91    Meds Scheduled Meds: .  stroke: mapping our early stages of recovery book   Does not apply Once  . sodium chloride   Intravenous Once  . sodium chloride   Intravenous Once  . apixaban  5 mg Oral BID  . aspirin  81 mg Per Tube Daily  . atorvastatin  80 mg Per Tube Q1200   . bisacodyl  10 mg Rectal Daily  . chlorhexidine gluconate (MEDLINE KIT)  15 mL Mouth Rinse BID  . Chlorhexidine Gluconate Cloth  6 each Topical Daily  . docusate  200 mg Oral Daily  . ferrous [id number] C-folic acid  1 capsule Oral TID PC  . insulin aspart  0-24 Units Subcutaneous Q4H  . isosorbide mononitrate  30 mg Oral Daily  . [START ON 02/20/2020] levothyroxine  100 mcg Oral Once per day on Mon Wed Fri  . levothyroxine  200 mcg Oral Once per day on Sun Tue Thu Sat  . lisinopril  40 mg Oral Daily  . mouth rinse  15 mL Mouth Rinse 10 times per day  . metoprolol tartrate  25 mg Oral BID  . pantoprazole sodium  40 mg Oral Daily  . sertraline  50 mg Oral QHS  . sodium chloride flush  10-40 mL Intracatheter Q12H   Continuous Infusions: . feeding supplement (OSMOLITE 1.2 CAL) 1,000 mL (02/16/20 2150)   PRN Meds:.acetaminophen **OR** acetaminophen (TYLENOL) oral liquid 160 mg/5 mL **OR** acetaminophen, magic mouthwash, sodium chloride flush, sodium chloride flush  Xrays DG Swallowing Func-Speech Pathology  Result Date:  02/17/2020 Objective Swallowing Evaluation: Type of Study: MBS-Modified Barium Swallow Study  Patient Details Name: ANALEIA ISMAEL MRN: [id number] Date of Birth: 1941/10/05 Today's Date: 02/17/2020 Time: SLP Start Time (ACUTE ONLY): 1007 -SLP Stop Time (ACUTE ONLY): 1027 SLP Time Calculation (min) (ACUTE ONLY): 20 min Past Medical History: Past Medical History: Diagnosis Date . Coronary artery disease  . Family history of adverse reaction to anesthesia   " My Sister did " . HTN (hypertension)  . Hyperlipidemia  . Hypothyroidism  . Osteoporosis  . Thyroid carcinoma [facility])  Past Surgical History: Past Surgical History: Procedure Laterality Date . ABDOMINAL HYSTERECTOMY   . ANKLE SURGERY   . APPENDECTOMY   . CARDIAC CATHETERIZATION  01/25/2010 . CHOLECYSTECTOMY   . CORONARY ARTERY BYPASS GRAFT N/A 01/27/2020  Procedure: CORONARY ARTERY BYPASS GRAFTING (CABG) x 4 using LIMA to  LAD; RIMA to OM1; Left radia sequential PDA and Left PL.;  Surgeon: [name], MD;  Location: [facility] OR;  Service: Open Heart Surgery;  Laterality: N/A;  BILATERAL IMA . gamma knife   . IR CT HEAD LTD  01/30/2020 . IR PERCUTANEOUS ART THROMBECTOMY/INFUSION INTRACRANIAL INC DIAG ANGIO  01/30/2020 . LEFT HEART CATH AND CORONARY ANGIOGRAPHY N/A 01/26/2020  Procedure: LEFT HEART CATH AND CORONARY ANGIOGRAPHY;  Surgeon: Belva Crome, MD;  Location: [place] CV LAB;  Service: Cardiovascular;  Laterality: N/A; . RADIAL ARTERY HARVEST Left 01/27/2020  Procedure: RADIAL ARTERY HARVEST (OPEN HARVEST);  Surgeon: [name], MD;  Location: [place];  Service: Open Heart Surgery;  Laterality: Left; . RADIOLOGY WITH ANESTHESIA N/A 01/30/2020  Procedure: IR WITH ANESTHESIA;  Surgeon: Radiologist, Medication, MD;  Location: [place];  Service: Radiology;  Laterality: N/A; . TEE WITHOUT CARDIOVERSION N/A 01/27/2020  Procedure: TRANSESOPHAGEAL ECHOCARDIOGRAM (TEE);  Surgeon: [name], MD;  Location: [place];  Service: Open Heart Surgery;  Laterality: N/A; . THYROIDECTOMY   . TOTAL HIP ARTHROPLASTY   HPI:  79 y.o. female admitted 01/26/20 after cardiac cath showed severe vessel disease; s/p CABG x4 on 3/13. 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 Pt with bilateral PEs. Taken to IR and found to have L M1/MCA occlusion, s/p emergent mechanical thrombectomy. ETT 3/12-3/13 (for procedure), 3/15-3/19. S/p trach 3/19. PMH includes CAD, HTN, THA, osteoporosis.  MBS 3/26 with severe dysphagia and aspiration.   Subjective: alert, participatory Assessment / Plan / Recommendation CHL IP CLINICAL IMPRESSIONS 02/17/2020 Clinical Impression Pt presents with significantly improved swallow function since last MBS on 02/10/20.  Demonstrates improved propulsion of barium through pharynx and UES with only trace residue, which resides superior to small cervical  osteophyte at C6 and occasionally within valleculae.  There was consistent laryngeal vestibule closure, with penetration on fewer than 10% of trials (thin liquids) - there was only one incident of penetration to the vocal folds, which pt sensed and spontaneously ejected from larynx with a cough.  There was no observed aspiration.  Recommend that pt initiate a regular diet, thin liquids.  Cortrak no longer necessary.  Pt was extremely excited about results, repeating "I can't believe it!"  D/W RN after procedure.   SLP Visit Diagnosis Dysphagia, oropharyngeal phase (R13.12) Attention and concentration deficit following -- Frontal lobe and executive function deficit following -- Impact on safety and function No limitations   CHL IP TREATMENT RECOMMENDATION 02/17/2020 Treatment Recommendations No treatment recommended at this time   Prognosis 02/10/2020 Prognosis for Safe Diet Advancement  Fair Barriers to Reach Goals -- Barriers/Prognosis Comment -- CHL IP DIET RECOMMENDATION 02/17/2020 SLP Diet Recommendations Regular solids;Thin liquid Liquid Administration via Cup;Straw Medication Administration Whole meds with puree Compensations -- Postural Changes --   CHL IP OTHER RECOMMENDATIONS 02/17/2020 Recommended Consults -- Oral Care Recommendations Oral care BID Other Recommendations --   CHL IP FOLLOW UP RECOMMENDATIONS 02/16/2020 Follow up Recommendations Inpatient Rehab   CHL IP FREQUENCY AND DURATION 02/10/2020 Speech Therapy Frequency (ACUTE ONLY) min 3x week Treatment Duration 2 weeks      CHL IP ORAL PHASE 02/17/2020 Oral Phase WFL Oral - Pudding Teaspoon -- Oral - Pudding Cup -- Oral - Honey Teaspoon -- Oral - Honey Cup -- Oral - Nectar Teaspoon -- Oral - Nectar Cup -- Oral - Nectar Straw -- Oral - Thin Teaspoon -- Oral - Thin Cup -- Oral - Thin Straw -- Oral - Puree -- Oral - Mech Soft -- Oral - Regular -- Oral - Multi-Consistency -- Oral - Pill -- Oral Phase - Comment --  CHL IP PHARYNGEAL PHASE 02/17/2020 Pharyngeal Phase  Impaired Pharyngeal- Pudding Teaspoon -- Pharyngeal -- Pharyngeal- Pudding Cup -- Pharyngeal -- Pharyngeal- Honey Teaspoon -- Pharyngeal -- Pharyngeal- Honey Cup -- Pharyngeal -- Pharyngeal- Nectar Teaspoon WFL Pharyngeal Material does not enter airway Pharyngeal- Nectar Cup -- Pharyngeal -- Pharyngeal- Nectar Straw -- Pharyngeal -- Pharyngeal- Thin Teaspoon NT Pharyngeal -- Pharyngeal- Thin Cup -- Pharyngeal -- Pharyngeal- Thin Straw Penetration/Aspiration before swallow Pharyngeal Material enters airway, CONTACTS cords and then ejected out Pharyngeal- Puree Delayed swallow initiation-vallecula;Pharyngeal residue - valleculae Pharyngeal Material does not enter airway Pharyngeal- Mechanical Soft -- Pharyngeal -- Pharyngeal- Regular Delayed swallow initiation-vallecula Pharyngeal -- Pharyngeal- Multi-consistency -- Pharyngeal -- Pharyngeal- Pill -- Pharyngeal -- Pharyngeal Comment --  No flowsheet data found. [name] Laurice 02/17/2020, 12:08 PM               Assessment/Plan: S/P Procedure(s) (LRB): IR WITH ANESTHESIA (N/A)  1 hemodyn stable in sinus rhythm 2 sats good on RA 3 H/H stable 4 now on reg diet, push intake as able 5 cont to push rehab as able 6 BS well controlled 7 cont therapies 8 SNF at discharge for further rehab  LOS: 23 days    John Giovanni PA-C Pager [phone] 02/18/2020

## 2020-02-18 NOTE — NC FL2 (Signed)
[place] MEDICAID FL2 LEVEL OF CARE SCREENING TOOL     IDENTIFICATION  Patient Name: [name] [name] Birthdate: Jun 23, 1941 Sex: female Admission Date (Current Location): 01/26/2020  [facility] and [place] Number:  Herbalist and Address:  The [place], [place] [address] [place], [place] [address]      Provider Number: [id number]  Attending Physician Name and Address:  [name], MD  Relative Name and Phone Number:  Lezlie Lye [phone]    Current Level of Care: Hospital Recommended Level of Care: [place] Prior Approval Number:    Date Approved/Denied:   PASRR Number: [id number]  Discharge Plan: SNF    Current Diagnoses: Patient Active Problem List   Diagnosis Date Noted  . Subglottic edema   . History of open heart surgery   . Pleural effusion on right   . Status post tracheostomy ([place])   . Stroke ([place])   . History of thyroid cancer   . Leukocytosis   . Acute blood loss anemia   . Acute hypoxemic respiratory failure ([place])   . Tracheostomy status ([place])   . 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 3-vessel CAD 01/26/2020  . Coronary artery disease of native artery of native heart with stable angina pectoris ([place])   . Precordial chest pain 06/15/2014  . Carotid stenosis 06/15/2014  . Essential hypertension, benign 06/15/2014    Orientation RESPIRATION BLADDER Height & Weight     Self, Time, Situation, Place  Normal Continent Weight: 141 lb 1.5 oz (64 kg) Height:  _0  (160 cm)  BEHAVIORAL SYMPTOMS/MOOD NEUROLOGICAL BOWEL NUTRITION STATUS      Continent Diet(please see discharge summary)  AMBULATORY STATUS COMMUNICATION OF NEEDS Skin     Verbally Surgical wounds(closed incision;chest, incision closed left hand)                       Personal Care Assistance Level of Assistance  Bathing, Feeding, Dressing Bathing Assistance: Limited assistance Feeding assistance: Limited assistance Dressing  Assistance: Limited assistance     Functional Limitations Info  Speech, Sight, Hearing Sight Info: Adequate Hearing Info: Adequate Speech Info: Adequate    SPECIAL CARE FACTORS FREQUENCY                       Contractures Contractures Info: Not present    Additional Factors Info  Code Status, Allergies Code Status Info: FULL Allergies Info: Bee venom, Penicillins, Sulfa antibiotics, Adhesive tape, and Morphine and related           Current Medications (02/18/2020):  This is the current hospital active medication list Current Facility-Administered Medications  Medication Dose Route Frequency Provider Last Rate Last Admin  .  stroke: mapping our early stages of recovery book   Does not apply Once Salvadore Dom E, NP      . 0.9 %  sodium chloride infusion (Manually program via Guardrails IV Fluids)   Intravenous Once Erick Colace, NP      . 0.9 %  sodium chloride infusion (Manually program via Guardrails IV Fluids)   Intravenous Once Erick Colace, NP      . acetaminophen (TYLENOL) tablet 650 mg  650 mg Oral Q4H PRN Erick Colace, NP       Or  . acetaminophen (TYLENOL) 160 MG/5ML solution 650 mg  650 mg Per Tube Q4H PRN Erick Colace, NP   650 mg at 01/30/20 1711  Or  . acetaminophen (TYLENOL) suppository 650 mg  650 mg Rectal Q4H PRN Erick Colace, NP      . apixaban Arne Cleveland) tablet 5 mg  5 mg Oral BID Kris Mouton, RPH   5 mg at 02/18/20 6378  . aspirin chewable tablet 81 mg  81 mg Per Tube Daily Erick Colace, NP   81 mg at 02/18/20 0834  . atorvastatin (LIPITOR) tablet 80 mg  80 mg Per Tube Q1200 Erick Colace, NP   80 mg at 02/17/20 1232  . bisacodyl (DULCOLAX) suppository 10 mg  10 mg Rectal Daily Erick Colace, NP   10 mg at 01/31/20 1002  . chlorhexidine gluconate (MEDLINE KIT) (PERIDEX) 0.12 % solution 15 mL  15 mL Mouth Rinse BID Erick Colace, NP   15 mL at 02/18/20 0836  . Chlorhexidine Gluconate Cloth 2 % PADS 6 each  6 each  Topical Daily Erick Colace, NP   6 each at 02/18/20 0422  . docusate (COLACE) 50 MG/5ML liquid 200 mg  200 mg Oral Daily Antony Odea, PA-C   200 mg at 02/18/20 0835  . feeding supplement (OSMOLITE 1.2 CAL) liquid 1,000 mL  1,000 mL Per Tube Continuous Erick Colace, NP 60 mL/hr at 02/16/20 2150 1,000 mL at 02/16/20 2150  . ferrous [id number] C-folic acid (TRINSICON / FOLTRIN) capsule 1 capsule  1 capsule Oral TID PC Roddenberry, Myron G, PA-C   1 capsule at 02/18/20 0835  . insulin aspart (novoLOG) injection 0-24 Units  0-24 Units Subcutaneous Q4H Erick Colace, NP   2 Units at 02/17/20 0915  . isosorbide mononitrate (IMDUR) 24 hr tablet 30 mg  30 mg Oral Daily Roddenberry, Arlis Porta, PA-C   30 mg at 02/18/20 0834  . [START ON 02/20/2020] levothyroxine (SYNTHROID) tablet 100 mcg  100 mcg Oral Once per day on Mon Wed Fri Roddenberry, Myron G, PA-C      . levothyroxine (SYNTHROID) tablet 200 mcg  200 mcg Oral Once per day on Sun Tue Thu Sat Antony Odea, PA-C   200 mcg at 02/18/20 0546  . lisinopril (ZESTRIL) tablet 40 mg  40 mg Oral Daily Antony Odea, PA-C   40 mg at 02/18/20 2094  . magic mouthwash  5 mL Oral QID PRN [name], PA-C   5 mL at 02/11/20 1127  . MEDLINE mouth rinse  15 mL Mouth Rinse 10 times per day Erick Colace, NP   15 mL at 02/17/20 2108  . metoprolol tartrate (LOPRESSOR) tablet 25 mg  25 mg Oral BID Antony Odea, PA-C   25 mg at 02/18/20 0835  . pantoprazole sodium (PROTONIX) 40 mg/20 mL oral suspension 40 mg  40 mg Oral Daily Roddenberry, Myron G, PA-C      . sertraline (ZOLOFT) tablet 50 mg  50 mg Oral QHS Roddenberry, Myron G, PA-C   50 mg at 02/17/20 2101  . sodium chloride flush (NS) 0.9 % injection 10-40 mL  10-40 mL Intracatheter Q12H Erick Colace, NP   10 mL at 02/18/20 0837  . sodium chloride flush (NS) 0.9 % injection 10-40 mL  10-40 mL Intracatheter PRN Erick Colace, NP      . sodium chloride flush (NS)  0.9 % injection 3 mL  3 mL Intravenous PRN Erick Colace, NP         Discharge Medications: Please see discharge summary for a list of discharge medications.  Relevant  Imaging Results:  Relevant Lab Results:   Additional Information SSN [id number]  Vinie Sill, LCSWA

## 2020-02-18 NOTE — Progress Notes (Signed)
NAME:  [name] [name], MRN:  [id number], DOB:  1941/03/19, LOS: 23 ADMISSION DATE:  01/26/2020, CONSULTATION DATE:  01/30/2020 REFERRING MD:  Rory Percy CHIEF COMPLAINT:  Right hemiplegia   Brief History        This 78 year old was admitted on 3/8 for evaluation of chest pain.  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  She was up ambulating  and suddenly developed a right hemiplegia.  CTA showed an M1 occlusion and thrombectomy was performed.  An attempt was made to extubate the patient post procedure but this was not tolerated.  Of note at the time the CTA was performed she was also found to have pulmonary emboli.  History of present illness       History is as noted above with the following additions.  Intraoperative TEE showed essentially normal LV systolic dysfunction there was some LVH and a PFO with a left to right flow.  There is no thrombus noted in the left atrial appendage.  Past Medical History  Thyroid cancer, hypertension, hyperlipidemia.  Significant Hospital Events   Cardiac cath 3/12, CABG 3/13 3/15: Acute stroke with right-sided hemiplegia, also found to have acute pulmonary emboli 3/16 had seizure placed on Keppra 3/18 extubated, had upper airway edema, reintubated, noted to have significant airway swelling 3/19 tracheostomy placed at bedside 320 through 3/23: Tolerating aerosol trach collar, tolerating 28% is having bloody tracheal secretions 3/24: Changed to cuffless trach at bedside moved to stepdown unit 3/29: Has Passy-Muir valve in place, strong cough Consults:  Thoracic surgery, neurology, interventional radiology.  Procedures:  Four-vessel CABG, left M1 thrombectomy 3/19 trach  Significant Diagnostic Tests:  3/15 LE venous doppler:  RIGHT:  - There is no evidence of deep vein thrombosis in the lower extremity.    - No cystic structure found in the popliteal fossa.    LEFT:  - Findings  consistent with acute deep vein thrombosis involving the left  posterior tibial veins.  - No cystic structure found in the popliteal fossa.  3/15 echo: LVEF [id number], grade 1 diastolic dysfunction, LA dilation, RVSP mildly elevated 3/16 cth: No acute intracranial hemorrhage. Likely evolving recent infarction of the lentiform nucleus. 3/15 cta head/neck 1. Emergent large vessel occlusion at the left M1 segment. 2. Aspects of 10 with CT perfusion suggesting core infarct of 74 cc and 85 cc of penumbra. 3. Flow reducing left ICA bulb stenosis. 4. Bilateral subsegmental acute pulmonary emboli. 5. Proximal occlusion of the non dominant left vertebral artery with reconstitution at the dura. 3/16 eeg: This study showed evidence of epileptogenicity arising from left frontotemporal as well as cortical dysfunction in left hemisphere likely secondary to underlying stroke. No seizures were seen throughout the recording.  Micro Data:  3/8 sars2: neg  Antimicrobials:  Vancomycin and Zosyn started 3/25, discontinued 3/26 Ceftriaxone started 3/26, to get 4 total doses of this-today will be day 4  Interim history/subjective:  No events. Capped overnight without issue. On RA.  Objective   Blood pressure (!) 116/46, pulse 66, temperature 98.1 F (36.7 C), temperature source Oral, resp. rate 20, height 5\' 3"  (1.6 m), weight 64 kg, SpO2 98 %.    FiO2 (%):  [21 %] 21 %   Intake/Output Summary (Last 24 hours) at 02/18/2020 0802 Last data filed at 02/17/2020 2200 Gross per 24 hour  Intake 70 ml  Output --  Net 70 ml   Filed Weights   02/16/20 0231 02/17/20  0411 02/18/20 0303  Weight: 64.2 kg 64.2 kg 64 kg    Examination: No acute distress Capped trach in place Talking in full sentences Lungs clear Globally weak  Resolved Hospital Problem list     Assessment & Plan:   Acute L MCA CVA: Status post thrombectomy complicated by seizure Acute Pulmonary emboli-on anticoagulation Acute hypoxic  Respiratory failure now tracheostomy dependent following failed extubation due to upper airway obstruction from vocal cord edema.  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  If stoma does not close after 3 weeks refer to ENT  - Will sign off, call if we can be of further help    Erskine Emery MD [facility] 02/18/2020 8:03 AM Personal pager: [phone] If unanswered, please page CCM On-call: [phone]

## 2020-02-18 NOTE — TOC Progression Note (Addendum)
Transition of Care [facility]) - Progression Note    Patient Details  Name: [name] [name] MRN: [id number] Date of Birth: 16-Oct-1941  Transition of Care [facility]) CM/SW [place], [place] Phone Number: 02/18/2020, 10:47 AM  Clinical Narrative:     cortrak and trach removed- CSW sent out SNF referrals. CSW will inform patient of bed offered once available.  Thurmond Butts, MSW, [place] Clinical Social Worker   Expected Discharge Plan: Skilled Nursing Facility Barriers to Discharge: Continued Medical Work up, Ship broker, SNF Pending bed offer  Expected Discharge Plan and Services Expected Discharge Plan: [place] In-house Referral: Clinical Social Work     Living arrangements for the past 2 months: Single Family Home                                       Social Determinants of Health (SDOH) Interventions    Readmission Risk Interventions No flowsheet data found.

## 2020-02-19 LAB — GLUCOSE, CAPILLARY
Glucose-Capillary: 105 mg/dL — ABNORMAL HIGH (ref 70–99)
Glucose-Capillary: 98 mg/dL (ref 70–99)
Glucose-Capillary: 119 mg/dL — ABNORMAL HIGH (ref 70–99)

## 2020-02-19 MED ORDER — ASPIRIN 81 MG PO CHEW
81.0000 mg | CHEWABLE_TABLET | Freq: Every day | ORAL | Status: DC
  Administered 2020-02-20 – 2020-02-21 (×2): 81 mg via ORAL
  Filled 2020-02-19 (×2): qty 1

## 2020-02-19 MED ORDER — LISINOPRIL 10 MG PO TABS
20.0000 mg | ORAL_TABLET | Freq: Every day | ORAL | Status: DC
  Administered 2020-02-20 – 2020-02-21 (×2): 20 mg via ORAL
  Filled 2020-02-19 (×2): qty 2

## 2020-02-19 MED ORDER — ATORVASTATIN CALCIUM 80 MG PO TABS
80.0000 mg | ORAL_TABLET | Freq: Every day | ORAL | Status: DC
  Administered 2020-02-19 – 2020-02-21 (×3): 80 mg via ORAL
  Filled 2020-02-19 (×3): qty 1

## 2020-02-19 MED ORDER — ISOSORBIDE MONONITRATE ER 30 MG PO TB24
15.0000 mg | ORAL_TABLET | Freq: Every day | ORAL | Status: DC
  Administered 2020-02-20 – 2020-02-21 (×2): 15 mg via ORAL
  Filled 2020-02-19 (×2): qty 1

## 2020-02-19 NOTE — TOC Progression Note (Addendum)
Transition of Care [facility]) - Progression Note    Patient Details  Name: KANESSA MOVA MRN: [id number] Date of Birth: 12-16-1940  Transition of Care [facility]) CM/SW [place], [place] Phone Number: 02/19/2020, 10:26 AM  Clinical Narrative:     CSW provided patient with bed offers, patient not satisfied with choices. CSW gave patient medicare.gov SNF listing to review.  Thurmond Butts, MSW, [place] Clinical Social Worker    Expected Discharge Plan: Skilled Nursing Facility Barriers to Discharge: Continued Medical Work up, Ship broker, SNF Pending bed offer  Expected Discharge Plan and Services Expected Discharge Plan: [place] In-house Referral: Clinical Social Work     Living arrangements for the past 2 months: Single Family Home                                       Social Determinants of Health (SDOH) Interventions    Readmission Risk Interventions No flowsheet data found.

## 2020-02-19 NOTE — Progress Notes (Addendum)
[address]       [place] [address]             [phone]      20 Days Post-Op Procedure(s) (LRB): IR WITH ANESTHESIA (N/A) Subjective: conts to feel better slowly  Objective: Vital signs in last 24 hours: Temp:  [97.8 F (36.6 C)-98.1 F (36.7 C)] 98.1 F (36.7 C) (04/04 0529) Pulse Rate:  [63-70] 70 (04/04 0945) Cardiac Rhythm: Normal sinus rhythm (04/04 0700) Resp:  [13-20] 13 (04/04 0945) BP: (89-121)/(40-61) 97/40 (04/04 0945) SpO2:  [93 %-98 %] 97 % (04/04 0945) Weight:  [63 kg] 63 kg (04/04 0453)  Hemodynamic parameters for last 24 hours:    Intake/Output from previous day: 04/03 0701 - 04/04 0700 In: 230 [P.O.:230] Out: -  Intake/Output this shift: No intake/output data recorded.  General appearance: alert, cooperative and no distress Heart: regular rate and rhythm Lungs: clear to auscultation bilaterally Abdomen: benign Extremities: no edema Wound: incis healing well, left hand N/V intact  Lab Results: Recent Labs    02/17/20 0551 02/18/20 0430  WBC 7.7 8.2  HGB 8.5* 8.7*  HCT 27.0* 27.8*  PLT 354 356   BMET: No results for input(s): NA, K, CL, CO2, GLUCOSE, BUN, CREATININE, CALCIUM in the last 72 hours.  PT/INR: No results for input(s): LABPROT, INR in the last 72 hours. ABG    Component Value Date/Time   PHART 7.427 02/02/2020 1735   HCO3 28.5 (H) 02/02/2020 1735   TCO2 30 02/02/2020 1735   ACIDBASEDEF 2.0 01/28/2020 1545   O2SAT 100.0 02/02/2020 1735   CBG (last 3)  Recent Labs    02/18/20 1638 02/18/20 2013 02/19/20 0623  GLUCAP 88 92 105*    Meds Scheduled Meds: .  stroke: mapping our early stages of recovery book   Does not apply Once  . sodium chloride   Intravenous Once  . sodium chloride   Intravenous Once  . apixaban  5 mg Oral BID  . aspirin  81 mg Per Tube Daily  . atorvastatin  80 mg Per Tube Q1200  . bisacodyl  10 mg Rectal Daily  . chlorhexidine gluconate (MEDLINE KIT)  15 mL Mouth  Rinse BID  . Chlorhexidine Gluconate Cloth  6 each Topical Daily  . docusate  200 mg Oral Daily  . ferrous [id number] C-folic acid  1 capsule Oral TID PC  . insulin aspart  0-15 Units Subcutaneous TID WC  . isosorbide mononitrate  30 mg Oral Daily  . [START ON 02/20/2020] levothyroxine  100 mcg Oral Once per day on Mon Wed Fri  . levothyroxine  200 mcg Oral Once per day on Sun Tue Thu Sat  . lisinopril  40 mg Oral Daily  . mouth rinse  15 mL Mouth Rinse 10 times per day  . metoprolol tartrate  25 mg Oral BID  . pantoprazole sodium  40 mg Oral Daily  . sertraline  50 mg Oral QHS  . sodium chloride flush  10-40 mL Intracatheter Q12H   Continuous Infusions: . feeding supplement (OSMOLITE 1.2 CAL) 1,000 mL (02/16/20 2150)   PRN Meds:.acetaminophen **OR** acetaminophen (TYLENOL) oral liquid 160 mg/5 mL **OR** acetaminophen, magic mouthwash, sodium chloride flush, sodium chloride flush  Xrays DG Swallowing Func-Speech Pathology  Result Date: 02/17/2020 Objective Swallowing Evaluation: Type of Study: MBS-Modified Barium Swallow Study  Patient Details Name: [name] [name] MRN: [id number] Date of Birth: 14-Nov-1941 Today's Date: 02/17/2020 Time: SLP Start Time (ACUTE ONLY):  1007 -SLP Stop Time (ACUTE ONLY): 1027 SLP Time Calculation (min) (ACUTE ONLY): 20 min Past Medical History: Past Medical History: Diagnosis Date . Coronary artery disease  . Family history of adverse reaction to anesthesia   " My Sister did " . HTN (hypertension)  . Hyperlipidemia  . Hypothyroidism  . Osteoporosis  . Thyroid carcinoma [facility])  Past Surgical History: Past Surgical History: Procedure Laterality Date . ABDOMINAL HYSTERECTOMY   . ANKLE SURGERY   . APPENDECTOMY   . CARDIAC CATHETERIZATION  01/25/2010 . CHOLECYSTECTOMY   . CORONARY ARTERY BYPASS GRAFT N/A 01/27/2020  Procedure: CORONARY ARTERY BYPASS GRAFTING (CABG) x 4 using LIMA to LAD; RIMA to OM1; Left radia sequential PDA and Left PL.;  Surgeon: [name], MD;  Location: [facility] OR;  Service: Open Heart Surgery;  Laterality: N/A;  BILATERAL IMA . gamma knife   . IR CT HEAD LTD  01/30/2020 . IR PERCUTANEOUS ART THROMBECTOMY/INFUSION INTRACRANIAL INC DIAG ANGIO  01/30/2020 . LEFT HEART CATH AND CORONARY ANGIOGRAPHY N/A 01/26/2020  Procedure: LEFT HEART CATH AND CORONARY ANGIOGRAPHY;  Surgeon: Belva Crome, MD;  Location: [place] CV LAB;  Service: Cardiovascular;  Laterality: N/A; . RADIAL ARTERY HARVEST Left 01/27/2020  Procedure: RADIAL ARTERY HARVEST (OPEN HARVEST);  Surgeon: [name], MD;  Location: [place];  Service: Open Heart Surgery;  Laterality: Left; . RADIOLOGY WITH ANESTHESIA N/A 01/30/2020  Procedure: IR WITH ANESTHESIA;  Surgeon: Radiologist, Medication, MD;  Location: [place];  Service: Radiology;  Laterality: N/A; . TEE WITHOUT CARDIOVERSION N/A 01/27/2020  Procedure: TRANSESOPHAGEAL ECHOCARDIOGRAM (TEE);  Surgeon: [name], MD;  Location: [place];  Service: Open Heart Surgery;  Laterality: N/A; . THYROIDECTOMY   . TOTAL HIP ARTHROPLASTY   HPI:  79 y.o. female admitted 01/26/20 after cardiac cath showed severe vessel disease; s/p CABG x4 on 3/13. 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 Pt with bilateral PEs. Taken to IR and found to have L M1/MCA occlusion, s/p emergent mechanical thrombectomy. ETT 3/12-3/13 (for procedure), 3/15-3/19. S/p trach 3/19. PMH includes CAD, HTN, THA, osteoporosis.  MBS 3/26 with severe dysphagia and aspiration.   Subjective: alert, participatory Assessment / Plan / Recommendation CHL IP CLINICAL IMPRESSIONS 02/17/2020 Clinical Impression Pt presents with significantly improved swallow function since last MBS on 02/10/20.  Demonstrates improved propulsion of barium through pharynx and UES with only trace residue, which resides superior to small cervical osteophyte at C6 and occasionally within valleculae.  There was consistent laryngeal  vestibule closure, with penetration on fewer than 10% of trials (thin liquids) - there was only one incident of penetration to the vocal folds, which pt sensed and spontaneously ejected from larynx with a cough.  There was no observed aspiration.  Recommend that pt initiate a regular diet, thin liquids.  Cortrak no longer necessary.  Pt was extremely excited about results, repeating "I can't believe it!"  D/W RN after procedure.   SLP Visit Diagnosis Dysphagia, oropharyngeal phase (R13.12) Attention and concentration deficit following -- Frontal lobe and executive function deficit following -- Impact on safety and function No limitations   CHL IP TREATMENT RECOMMENDATION 02/17/2020 Treatment Recommendations No treatment recommended at this time   Prognosis 02/10/2020 Prognosis for Safe Diet Advancement Fair Barriers to Reach Goals -- Barriers/Prognosis Comment -- CHL IP DIET RECOMMENDATION 02/17/2020 SLP Diet Recommendations Regular solids;Thin liquid Liquid Administration via Cup;Straw Medication Administration Whole meds with puree Compensations -- Postural  Changes --   CHL IP OTHER RECOMMENDATIONS 02/17/2020 Recommended Consults -- Oral Care Recommendations Oral care BID Other Recommendations --   CHL IP FOLLOW UP RECOMMENDATIONS 02/16/2020 Follow up Recommendations Inpatient Rehab   CHL IP FREQUENCY AND DURATION 02/10/2020 Speech Therapy Frequency (ACUTE ONLY) min 3x week Treatment Duration 2 weeks      CHL IP ORAL PHASE 02/17/2020 Oral Phase WFL Oral - Pudding Teaspoon -- Oral - Pudding Cup -- Oral - Honey Teaspoon -- Oral - Honey Cup -- Oral - Nectar Teaspoon -- Oral - Nectar Cup -- Oral - Nectar Straw -- Oral - Thin Teaspoon -- Oral - Thin Cup -- Oral - Thin Straw -- Oral - Puree -- Oral - Mech Soft -- Oral - Regular -- Oral - Multi-Consistency -- Oral - Pill -- Oral Phase - Comment --  CHL IP PHARYNGEAL PHASE 02/17/2020 Pharyngeal Phase Impaired Pharyngeal- Pudding Teaspoon -- Pharyngeal -- Pharyngeal- Pudding Cup --  Pharyngeal -- Pharyngeal- Honey Teaspoon -- Pharyngeal -- Pharyngeal- Honey Cup -- Pharyngeal -- Pharyngeal- Nectar Teaspoon WFL Pharyngeal Material does not enter airway Pharyngeal- Nectar Cup -- Pharyngeal -- Pharyngeal- Nectar Straw -- Pharyngeal -- Pharyngeal- Thin Teaspoon NT Pharyngeal -- Pharyngeal- Thin Cup -- Pharyngeal -- Pharyngeal- Thin Straw Penetration/Aspiration before swallow Pharyngeal Material enters airway, CONTACTS cords and then ejected out Pharyngeal- Puree Delayed swallow initiation-vallecula;Pharyngeal residue - valleculae Pharyngeal Material does not enter airway Pharyngeal- Mechanical Soft -- Pharyngeal -- Pharyngeal- Regular Delayed swallow initiation-vallecula Pharyngeal -- Pharyngeal- Multi-consistency -- Pharyngeal -- Pharyngeal- Pill -- Pharyngeal -- Pharyngeal Comment --  No flowsheet data found. [name] Laurice 02/17/2020, 12:08 PM               Assessment/Plan: S/P Procedure(s) (LRB): IR WITH ANESTHESIA (N/A)  1 remains stable , BP a biit soft at times, 80's-90's syst, will decrease lisinopril to 20 q day and imdur 15 mg q day 2 sats good on RA 3 sinus rhythm 4 no new labs 5 BS well controlled 6 SW- working on placement, push nutrition and rehab as able   LOS: 24 days    John Giovanni PA-C Pager [phone] 02/19/2020 Patient seen and examined, agree with above Decrease BP meds SNF when arrangements complete  Remo Lipps C. Roxan Hockey, MD [facility] [phone]

## 2020-02-20 ENCOUNTER — Ambulatory Visit: Payer: PPO | Admitting: Cardiothoracic Surgery

## 2020-02-20 LAB — GLUCOSE, CAPILLARY
Glucose-Capillary: 96 mg/dL (ref 70–99)
Glucose-Capillary: 96 mg/dL (ref 70–99)
Glucose-Capillary: 90 mg/dL (ref 70–99)
Glucose-Capillary: 100 mg/dL — ABNORMAL HIGH (ref 70–99)
Glucose-Capillary: 102 mg/dL — ABNORMAL HIGH (ref 70–99)

## 2020-02-20 LAB — SARS CORONAVIRUS 2 (TAT 6-24 HRS): SARS Coronavirus 2: NEGATIVE

## 2020-02-20 MED ORDER — PANTOPRAZOLE SODIUM 40 MG PO TBEC
40.0000 mg | DELAYED_RELEASE_TABLET | Freq: Every day | ORAL | Status: DC
  Administered 2020-02-21: 09:00:00 40 mg via ORAL
  Filled 2020-02-20: qty 1

## 2020-02-20 MED ORDER — ENSURE ENLIVE PO LIQD
237.0000 mL | Freq: Two times a day (BID) | ORAL | Status: DC
  Administered 2020-02-21: 237 mL via ORAL

## 2020-02-20 MED ORDER — SERTRALINE HCL 50 MG PO TABS: 50.0000 mg | ORAL_TABLET | Freq: Every day | ORAL | Status: DC

## 2020-02-20 MED ORDER — APIXABAN 5 MG PO TABS: 5.0000 mg | ORAL_TABLET | Freq: Two times a day (BID) | ORAL | Status: DC

## 2020-02-20 MED ORDER — ISOSORBIDE MONONITRATE ER 30 MG PO TB24: 15.0000 mg | ORAL_TABLET | Freq: Every day | ORAL | Status: DC

## 2020-02-20 MED ORDER — PANTOPRAZOLE SODIUM 40 MG PO PACK: 40.0000 mg | PACK | Freq: Every day | ORAL | Status: DC

## 2020-02-20 NOTE — TOC Progression Note (Addendum)
Transition of Care [facility]) - Progression Note    Patient Details  Name: [name] [name] MRN: [id number] Date of Birth: 23-Jun-1941  Transition of Care [facility]) CM/SW [place], [place] Phone Number: 02/20/2020, 2:10 PM  Clinical Narrative:     CSW met with the patient at bedside, along with her nephew, [name]. CSW explained the SNF process and gave bed offers. Patient declined a few of the bed offers but wanted CSW to follow up with; [place]- left message  [facility]-left message  [employer] -left message  [place] in network  CSW gave nephew,[name], bed offers and encourage him to re-review and discuss bed offers with patient and make a decision today.   CSW started SNF insurance authorization-will need authorization before d/c to SNF. RN updated and Covid test requested.  CSW will continue to follow and assist with discharge planning.  Thurmond Butts, MSW, [place] Clinical Social Worker     Expected Discharge Plan: Skilled Nursing Facility Barriers to Discharge: Continued Medical Work up, Ship broker, SNF Pending bed offer  Expected Discharge Plan and Services Expected Discharge Plan: [place] In-house Referral: Clinical Social Work     Living arrangements for the past 2 months: Single Family Home                                       Social Determinants of Health (SDOH) Interventions    Readmission Risk Interventions No flowsheet data found.

## 2020-02-20 NOTE — Progress Notes (Signed)
Physical Therapy Treatment Patient Details Name: [name] [name] MRN: [id number] DOB: April 28, 1941 Today's Date: 02/20/2020    History of Present Illness Pt is a 79 y.o. female admitted 01/26/20 after cardiac cath showed severe vessel disease; s/p CABG x4 on 3/13. 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 Pt with bilateral PEs. Taken to IR and found to have L M1/MCA occlusion, s/p emergent mechanical thrombectomy. ETT 3/12-3/13 (for procedure), 3/15-3/19. S/p trach 3/19. PMH includes CAD, HTN, THA, osteoporosis.    PT Comments    Patient continues to make progress toward PT goals. This session focused on gait training without use of AD. Pt presents with decreased recall of sternal precautions and impaired gait, balance, and cognition increasing risk for falls. This therapist discussed need for continued rehab before d/c home alone given current deficits and pt agreeable. Continue to progress as tolerated with anticipated d/c to SNF for further skilled PT services.      Follow Up Recommendations  SNF     Equipment Recommendations  3in1 (PT);Rolling walker with 5" wheels    Recommendations for Other Services       Precautions / Restrictions Precautions Precautions: Sternal Precaution Comments: pt needs cues to maintain during functional mobility Restrictions Weight Bearing Restrictions: No    Mobility  Bed Mobility Overal bed mobility: Needs Assistance Bed Mobility: Sit to Supine       Sit to supine: Supervision   General bed mobility comments: cues for sternal precautions  Transfers Overall transfer level: Needs assistance   Transfers: Sit to/from Stand Sit to Stand: Min assist         General transfer comment: cues for hand placement but pt did not follow cues; assist to steady  Ambulation/Gait Ambulation/Gait assistance: Min assist Gait Distance (Feet): 40 Feet Assistive device:  1 person hand held assist(assist at trunk with gait belt) Gait Pattern/deviations: Decreased stride length;Step-through pattern;Shuffle;Narrow base of support Gait velocity: decreased Gait velocity interpretation: <1.31 ft/sec, indicative of household ambulator General Gait Details: assistance for balance and weight shifting; cues for upright posture and increased bilat step lengths   Stairs             Wheelchair Mobility    Modified Rankin (Stroke Patients Only) Modified Rankin (Stroke Patients Only) Pre-Morbid Rankin Score: No symptoms Modified Rankin: Moderately severe disability     Balance Overall balance assessment: Needs assistance Sitting-balance support: Feet supported Sitting balance-Leahy Scale: Fair     Standing balance support: During functional activity;Bilateral upper extremity supported Standing balance-Leahy Scale: Poor Standing balance comment: Requires UE support in standing.                            Cognition Arousal/Alertness: Awake/alert Behavior During Therapy: Flat affect Overall Cognitive Status: Impaired/Different from baseline Area of Impairment: Memory;Problem solving;Awareness;Following commands                     Memory: Decreased short-term memory;Decreased recall of precautions Following Commands: Follows one step commands consistently;Follows one step commands with increased time Safety/Judgement: Decreased awareness of deficits;Decreased awareness of safety Awareness: Emergent Problem Solving: Slow processing;Requires verbal cues General Comments: Pt aphasic and decreased insight into deficits        Exercises      General Comments        Pertinent Vitals/Pain Pain Assessment: No/denies pain    Home Living  Prior Function            PT Goals (current goals can now be found in the care plan section) Progress towards PT goals: Progressing toward goals     Frequency    Min 3X/week      PT Plan Current plan remains appropriate    Co-evaluation              AM-PAC PT "6 Clicks" Mobility   Outcome Measure  Help needed turning from your back to your side while in a flat bed without using bedrails?: None Help needed moving from lying on your back to sitting on the side of a flat bed without using bedrails?: None Help needed moving to and from a bed to a chair (including a wheelchair)?: A Little Help needed standing up from a chair using your arms (e.g., wheelchair or bedside chair)?: A Little Help needed to walk in hospital room?: A Little Help needed climbing 3-5 steps with a railing? : A Lot 6 Click Score: 19    End of Session Equipment Utilized During Treatment: Gait belt Activity Tolerance: Patient limited by fatigue Patient left: with call bell/phone within reach;in bed Nurse Communication: Mobility status PT Visit Diagnosis: Other abnormalities of gait and mobility (R26.89);Hemiplegia and hemiparesis Hemiplegia - Right/Left: Right Hemiplegia - dominant/non-dominant: Dominant Hemiplegia - caused by: Cerebral infarction     Time: [id number] PT Time Calculation (min) (ACUTE ONLY): 15 min  Charges:  $Gait Training: 8-22 mins                     Earney Navy, PTA Acute Rehabilitation Services Pager: [phone] Office: [phone]     Darliss Cheney 02/20/2020, 4:27 PM

## 2020-02-20 NOTE — Progress Notes (Signed)
      [address]       [place] [address]             [phone]      21 Days Post-Op Procedure(s) (LRB): IR WITH ANESTHESIA (N/A)   Subjective:  No specific complaints.  States she is doing okay.  Objective: Vital signs in last 24 hours: Temp:  [97.6 F (36.4 C)-98 F (36.7 C)] 98 F (36.7 C) (04/05 0309) Pulse Rate:  [60-78] 61 (04/05 0309) Cardiac Rhythm: Normal sinus rhythm (04/05 0400) Resp:  [13-20] 20 (04/05 0309) BP: (97-119)/(40-54) 119/46 (04/05 0309) SpO2:  [96 %-100 %] 98 % (04/05 0309) Weight:  [63.3 kg] 63.3 kg (04/05 0309)  Intake/Output from previous day: 04/04 0701 - 04/05 0700 In: 230 [P.O.:220; I.V.:10] Out: -   General appearance: alert, cooperative and no distress Heart: regular rate and rhythm Lungs: clear to auscultation bilaterally Abdomen: soft, non-tender; bowel sounds normal; no masses,  no organomegaly Extremities: no edema Wound: clean and dry, healing without evidence of infection  Lab Results: Recent Labs    02/18/20 0430  WBC 8.2  HGB 8.7*  HCT 27.8*  PLT 356   BMET: No results for input(s): NA, K, CL, CO2, GLUCOSE, BUN, CREATININE, CALCIUM in the last 72 hours.  PT/INR: No results for input(s): LABPROT, INR in the last 72 hours. ABG    Component Value Date/Time   PHART 7.427 02/02/2020 1735   HCO3 28.5 (H) 02/02/2020 1735   TCO2 30 02/02/2020 1735   ACIDBASEDEF 2.0 01/28/2020 1545   O2SAT 100.0 02/02/2020 1735   CBG (last 3)  Recent Labs    02/19/20 1537 02/19/20 2145 02/20/20 0631  GLUCAP 119* 96 96    Assessment/Plan: S/P Procedure(s) (LRB): IR WITH ANESTHESIA (N/A)  1. CV- NSR, BP improved- Lopressor, Imdur, Zestril 2. Pulm- no acute issues, continue IS 3. CBGs- controlled, patient is not a diabetic, will d/c SSIP 4. Dispo- patient stable, medically stable for discharge one placement is arranged   LOS: 25 days    Ellwood Handler, PA-C 02/20/2020

## 2020-02-20 NOTE — Progress Notes (Signed)
Nutrition Follow-up  DOCUMENTATION CODES:   Not applicable  INTERVENTION:    Ensure Enlive po BID, each supplement provides 350 kcal and 20 grams of protein  MVI daily   NUTRITION DIAGNOSIS:   Inadequate oral intake related to inability to eat as evidenced by NPO status.  Diet advanced   GOAL:   Patient will meet greater than or equal to 90% of their needs  Progressing  MONITOR:   Vent status, I & O's  REASON FOR ASSESSMENT:   Ventilator    ASSESSMENT:   Pt with PMH of HTN, HLD, and thyroid cancer admitted 3/11 for chest pain found to have 4-vessel CAD and underwent CABG 3/13.   3/11 Admitted 3/13 CABG x4 3/15 L MCA infarct with left M1 occlusion s/p thromecbtomy, Intubated 3/16 Seizures, started on Keppra 3/17 Cortrak placed 3/19 Trach placed 3/20 Trach collar 3/24- changed to cuffless 4/2- diet advanced to regular, Cortrak removed 4/3- s/p decannulation  Pt tolerating regular diet. Reports not liking hospital food but appetite is progressing daily. Meal completions charted as 40-80% for her last 6 documented meals. Discussed the importance of protein intake to promote post-op healing. Pt amenable to Ensure.   Admission weight: 65.3 kg  Current weight: 63.3 kg   Medications: dulcolax, colace, foltrin Labs: CBG 90-119  Diet Order:   Diet Order            Diet regular Room service appropriate? No; Fluid consistency: Thin  Diet effective now              EDUCATION NEEDS:   No education needs have been identified at this time  Skin:  Skin Assessment: Skin Integrity Issues: Skin Integrity Issues:: Incisions Incisions: L hand, chest  Last BM:  4/3  Height:   Ht Readings from Last 1 Encounters:  02/08/20 5\' 3"  (1.6 m)    Weight:   Wt Readings from Last 1 Encounters:  02/20/20 63.3 kg    Ideal Body Weight:  52.2 kg  BMI:  Body mass index is 24.71 kg/m.  Estimated Nutritional Needs:   Kcal:  1650-1850 kcals  Protein:   80-90g  Fluid:  >/= 1.7 L   Mariana Single RD, LDN Clinical Nutrition Pager listed in [place]

## 2020-02-20 NOTE — Progress Notes (Signed)
CARDIAC REHAB PHASE I   PRE:  Rate/Rhythm: 76 SR  BP:  Sitting: 105/50      SaO2: 97 RA  MODE:  Ambulation: 300 ft   POST:  Rate/Rhythm: 96 SR  BP:  Sitting: 129/68    SaO2: 96 RA  Pt helped to [facility] than ambulated 38ft in hallway standby assist x1 with front wheel walker. Pt continues to progress and able to increase distance. Pt c/o slight SOB. Pt returned to recliner, call bell and bedside table within reach. Reviewed with pt CIR denial, inability to go home alone. Pt continues to decline SNF. Will continue to follow.  [id number] Rufina Falco, RN BSN 02/20/2020 9:27 AM

## 2020-02-21 DIAGNOSIS — D62 Acute posthemorrhagic anemia: Secondary | ICD-10-CM | POA: Diagnosis not present

## 2020-02-21 DIAGNOSIS — J90 Pleural effusion, not elsewhere classified: Secondary | ICD-10-CM | POA: Diagnosis not present

## 2020-02-21 DIAGNOSIS — R41841 Cognitive communication deficit: Secondary | ICD-10-CM | POA: Diagnosis not present

## 2020-02-21 DIAGNOSIS — Z951 Presence of aortocoronary bypass graft: Secondary | ICD-10-CM | POA: Diagnosis not present

## 2020-02-21 DIAGNOSIS — I2510 Atherosclerotic heart disease of native coronary artery without angina pectoris: Secondary | ICD-10-CM | POA: Diagnosis not present

## 2020-02-21 DIAGNOSIS — I69320 Aphasia following cerebral infarction: Secondary | ICD-10-CM | POA: Diagnosis not present

## 2020-02-21 DIAGNOSIS — G459 Transient cerebral ischemic attack, unspecified: Secondary | ICD-10-CM | POA: Diagnosis not present

## 2020-02-21 DIAGNOSIS — Z743 Need for continuous supervision: Secondary | ICD-10-CM | POA: Diagnosis not present

## 2020-02-21 DIAGNOSIS — I639 Cerebral infarction, unspecified: Secondary | ICD-10-CM | POA: Diagnosis not present

## 2020-02-21 DIAGNOSIS — R531 Weakness: Secondary | ICD-10-CM | POA: Diagnosis not present

## 2020-02-21 DIAGNOSIS — E785 Hyperlipidemia, unspecified: Secondary | ICD-10-CM | POA: Diagnosis not present

## 2020-02-21 DIAGNOSIS — R279 Unspecified lack of coordination: Secondary | ICD-10-CM | POA: Diagnosis not present

## 2020-02-21 DIAGNOSIS — Z20828 Contact with and (suspected) exposure to other viral communicable diseases: Secondary | ICD-10-CM | POA: Diagnosis not present

## 2020-02-21 DIAGNOSIS — Z1152 Encounter for screening for COVID-19: Secondary | ICD-10-CM | POA: Diagnosis not present

## 2020-02-21 DIAGNOSIS — I10 Essential (primary) hypertension: Secondary | ICD-10-CM | POA: Diagnosis not present

## 2020-02-21 DIAGNOSIS — R2681 Unsteadiness on feet: Secondary | ICD-10-CM | POA: Diagnosis not present

## 2020-02-21 DIAGNOSIS — M6281 Muscle weakness (generalized): Secondary | ICD-10-CM | POA: Diagnosis not present

## 2020-02-21 DIAGNOSIS — I69120 Aphasia following nontraumatic intracerebral hemorrhage: Secondary | ICD-10-CM | POA: Diagnosis not present

## 2020-02-21 LAB — GLUCOSE, CAPILLARY: Glucose-Capillary: 93 mg/dL (ref 70–99)

## 2020-02-21 MED ORDER — ENSURE ENLIVE PO LIQD: 237.0000 mL | Freq: Two times a day (BID) | ORAL | 12 refills | Status: DC

## 2020-02-21 NOTE — Progress Notes (Signed)
Right arm DL PICC removed per protocol for pt's discharge. Manual pressure applied for 5 mins. No bleeding or swelling noted. Instructed patient to remain in bed until 1550. Educated patient about S/S of infection and when to call MD; no heavy lifting or pressure on right side for 24 hours; keep dressing dry and intact until 1600 tomorrow. Pt verbalized comprehension.

## 2020-02-21 NOTE — TOC Progression Note (Signed)
Transition of Care [facility]) - Progression Note    Patient Details  Name: [name] [name] MRN: [id number] Date of Birth: [birth year]  Transition of Care [facility]) CM/SW [place], [place] Phone Number: 02/21/2020, 12:22 PM  Clinical Narrative:     Received insurance authorization for SNF # [id number] and PTAR authorization # [id number]. CSW informed HTA patient will d/c to [place], MSW, [facility] Clinical Social Worker   Expected Discharge Plan: [place] Barriers to Discharge: Barriers Resolved  Expected Discharge Plan and Services Expected Discharge Plan: [place] In-house Referral: Clinical Social Work     Living arrangements for the past 2 months: Single Family Home Expected Discharge Date: 02/21/20                                     Social Determinants of Health (SDOH) Interventions    Readmission Risk Interventions No flowsheet data found.

## 2020-02-21 NOTE — Progress Notes (Signed)
      [address]       [place] [address]             [phone]      22 Days Post-Op Procedure(s) (LRB): IR WITH ANESTHESIA (N/A)   Subjective:  No new complaints.  She has not yet made a decision on a SNF.  She has provided multiple choices to [facility] she is willing to consider.  Objective: Vital signs in last 24 hours: Temp:  [97.2 F (36.2 C)-98.3 F (36.8 C)] 98.3 F (36.8 C) (04/06 0525) Pulse Rate:  [56-72] 62 (04/06 0525) Cardiac Rhythm: Normal sinus rhythm (04/05 1929) Resp:  [16-19] 19 (04/06 0525) BP: (107-143)/(47-91) 139/53 (04/06 0525) SpO2:  [96 %-100 %] 97 % (04/06 0525) Weight:  [63.3 kg] 63.3 kg (04/06 0500)  Intake/Output from previous day: 04/05 0701 - 04/06 0700 In: 720 [P.O.:720] Out: -   General appearance: alert, cooperative and no distress Heart: regular rate and rhythm Lungs: clear to auscultation bilaterally Abdomen: soft, non-tender; bowel sounds normal; no masses,  no organomegaly Extremities: extremities normal, atraumatic, no cyanosis or edema Wound: clean and healing without evidence of infection  Lab Results: No results for input(s): WBC, HGB, HCT, PLT in the last 72 hours. BMET: No results for input(s): NA, K, CL, CO2, GLUCOSE, BUN, CREATININE, CALCIUM in the last 72 hours.  PT/INR: No results for input(s): LABPROT, INR in the last 72 hours. ABG    Component Value Date/Time   PHART 7.427 02/02/2020 1735   HCO3 28.5 (H) 02/02/2020 1735   TCO2 30 02/02/2020 1735   ACIDBASEDEF 2.0 01/28/2020 1545   O2SAT 100.0 02/02/2020 1735   CBG (last 3)  Recent Labs    02/20/20 1635 02/20/20 2131 02/21/20 0614  GLUCAP 100* 102* 93    Assessment/Plan: S/P Procedure(s) (LRB): IR WITH ANESTHESIA (N/A)  1. CV- remains hemodynamically stable on Lopressor, Imdur, Zestril 2. Pulm- no acute issues, trach has been removed 3. Post operative CVA 4. Deconditioning- working with PT/OT... SNF placement, COVID test has been  completed and is negatvie 5. Dispo- patient is medically stable for discharge, needs SNF bed... hopefully available this afternoon   LOS: 26 days    Ellwood Handler, PA-C  02/21/2020

## 2020-02-21 NOTE — Progress Notes (Signed)
Order received to discharge patient to SNF.  Telemetry monitor removed and CCMD notified.  PICC removal via IV Team.  Report called to receiving RN, Mia.  Awaiting PTAR arrival. Will continue to monitor.

## 2020-02-21 NOTE — Progress Notes (Addendum)
CARDIAC REHAB PHASE I   PRE:  Rate/Rhythm: 54 SB  BP:  Sitting: 128/55      SaO2: 96 RA  MODE:  Ambulation: 350 ft   POST:  Rate/Rhythm: 77 SR  BP:  Sitting: 138/63    SaO2: 95 RA  Pt helped to [facility] than ambulated 333ft in hallway standby assist with front wheel walker. Pt with slow steady pace. Continues to be motivated to walk and increase distance. Pt returned to recliner. NT at bedside to wash pts hair. Pt laughing and interacting today. Continued to provide support and encouragement. Hopeful for d/c soon. Encouraged continued ambulation, sternal precautions, and IS use. Will refer to [facility] [place], with knowledge pt first needs to complete rehab. Pt is interested in participating in Virtual Cardiac and Pulmonary Rehab. Pt advised that Virtual Cardiac and Pulmonary Rehab is provided at no cost to the patient.  Checklist:  1. Pt has smart device  ie smartphone and/or ipad for downloading an app  Yes 2. Reliable internet/wifi service    Yes 3. Understands how to use their smartphone and navigate within an app.  Yes  Pt verbalized understanding and is in agreement.   [id number] Rufina Falco, RN BSN 02/21/2020 10:51 AM

## 2020-02-21 NOTE — Progress Notes (Signed)
  Speech Language Pathology Treatment: Cognitive-Linquistic  Patient Details Name: ZEYA VICENTI MRN: [id number] DOB: Jan 07, 1941 Today's Date: 02/21/2020 Time: [id number] SLP Time Calculation (min) (ACUTE ONLY): 73 min  Assessment / Plan / Recommendation Clinical Impression  Aphasia: Spent great deal of time in speech/language therapy session with pt this am.  She was assessed with the Reading Comprehension Battery for Aphasia (RCBA): results indicated excellent comprehension of single words in subcategories:  Visual confusions: 8/10 Auditory confusions: 10/10 Semantic confusions: 10/10  Reading comprehension at the sentence level (e.g., functional reading tasks like "point to the picture of the door you would use to get out" with choice of Exit door, womens' restroom, do not enter) was much more difficult.    F/u therapy should extend reading comprehension tasks to short (3-4 word) phrases.   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  Confrontation naming of basic objects today was achieved with 50% accuracy initially for spontaneous success; 30% required sentence cue; 20% required model of word.  With repetition of ten target words, by end of session patient achieved 70% accuracy; 30% required sentence-completion cues.    Pt is often able to describe function of objects, which is unusual, and is beginning to learn to self-cue by stating description first (e.g, "I fix my hair with a.." to lead herself to retrieve the word /comb/). She often stated "I know exactly what it is," and had questions about her recovery and diagnosis of aphasia.  Swallowing: Dysphagia has resolved.  Pt is now eating a regular diet, drinking thin liquids, and taking pills with water with no aspiration.  No f/u for swallowing will be needed.  Discussed results and status with pt and nephew, Ozzie Hoyle.  Anticipate excellent progress in the weeks and months ahead. Pt is  preparing for D/C today to [facility].      HPI HPI:  79 y.o. female admitted 01/26/20 after cardiac cath showed severe vessel disease; s/p CABG x4 on 3/13. 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 Pt with bilateral PEs. Taken to IR and found to have L M1/MCA occlusion, s/p emergent mechanical thrombectomy. ETT 3/12-3/13 (for procedure), 3/15-3/19. S/p trach 3/19. PMH includes CAD, HTN, THA, osteoporosis.  MBS 3/26 with severe dysphagia and aspiration.  Repeat MBS 4/2 with significantly improved swallow function: there was no aspiration.  Regular solids and thin liquids were initiated.  Pt was subsequently decannulated.       SLP Plan  Discharge SLP treatment due to (comment) d/c to SNF       Recommendations  Diet recommendations: Regular;Thin liquid                Oral Care Recommendations: Oral care BID Follow up Recommendations: Skilled Nursing facility SLP Visit Diagnosis: Aphasia (R47.01) Plan: Discharge SLP treatment due to (comment)       GO                [name] Laurice 02/21/2020, 10:25 AM Estill Bamberg L. Tivis Ringer, [place] Office number [phone] Pager [phone]

## 2020-02-21 NOTE — TOC Transition Note (Signed)
Transition of Care [facility]) - CM/SW Discharge Note   Patient Details  Name: TITANIA YTURRALDE MRN: [id number] Date of Birth: 1941-02-16  Transition of Care [facility] [place] CM/SW Contact:  Vinie Sill, [place] Phone Number: 02/21/2020, 2:14 PM   Clinical Narrative:     Patient will DC to: [facility] DC Date: 02/21/2020 Family Notified:[name],nephew Transport [id number]  RN, patient, and facility notified of DC. Discharge Summary sent to facility. RN given number for [phone]. Ambulance transport requested for patient.   Clinical Social Worker signing off.  Thurmond Butts, MSW, [place] Clinical Social Worker    Final next level of care: Skilled Nursing Facility Barriers to Discharge: Barriers Resolved   Patient Goals and CMS Choice        Discharge Placement PASRR number recieved: 02/18/20            Patient [facility] bed at: [facility] Patient to be transferred to facility by: [place] Name of family member notified: [name], nephew Patient and family notified of of transfer: 02/21/20  Discharge Plan and Services In-house Referral: Clinical Social Work                                   Social Determinants of Health ([place]) Interventions     Readmission Risk Interventions No flowsheet data found.

## 2020-02-22 ENCOUNTER — Telehealth (HOSPITAL_COMMUNITY): Payer: Self-pay

## 2020-02-22 DIAGNOSIS — E785 Hyperlipidemia, unspecified: Secondary | ICD-10-CM | POA: Diagnosis not present

## 2020-02-22 DIAGNOSIS — I2510 Atherosclerotic heart disease of native coronary artery without angina pectoris: Secondary | ICD-10-CM | POA: Diagnosis not present

## 2020-02-22 DIAGNOSIS — I639 Cerebral infarction, unspecified: Secondary | ICD-10-CM | POA: Diagnosis not present

## 2020-02-22 DIAGNOSIS — I10 Essential (primary) hypertension: Secondary | ICD-10-CM | POA: Diagnosis not present

## 2020-02-22 NOTE — Telephone Encounter (Signed)
Pt insurance is active and benefits verified through [employer]. Co-pay $15.00, DED $0.00/$0.00 met, out of pocket $3,400.00/$300.00 met, co-insurance 0%. No pre-authorization required. Greg/HTA, 02/22/2020 @ 10:39AM, EKC#[id number]  Will contact patient to see if she is interested in the Cardiac Rehab Program. If interested, patient will need to complete follow up appt. Once completed, patient will be contacted for scheduling upon review by the RN Navigator.

## 2020-02-23 ENCOUNTER — Ambulatory Visit: Payer: PPO | Admitting: Cardiovascular Disease

## 2020-02-24 ENCOUNTER — Other Ambulatory Visit: Payer: Self-pay | Admitting: Cardiothoracic Surgery

## 2020-02-24 DIAGNOSIS — Z951 Presence of aortocoronary bypass graft: Secondary | ICD-10-CM

## 2020-02-27 ENCOUNTER — Ambulatory Visit: Payer: Self-pay | Admitting: Cardiothoracic Surgery

## 2020-03-05 ENCOUNTER — Telehealth (HOSPITAL_COMMUNITY): Payer: Self-pay

## 2020-03-05 ENCOUNTER — Encounter: Payer: Self-pay | Admitting: Cardiothoracic Surgery

## 2020-03-05 ENCOUNTER — Ambulatory Visit (INDEPENDENT_AMBULATORY_CARE_PROVIDER_SITE_OTHER): Payer: Self-pay | Admitting: Cardiothoracic Surgery

## 2020-03-05 ENCOUNTER — Ambulatory Visit
Admission: RE | Admit: 2020-03-05 | Discharge: 2020-03-05 | Disposition: A | Discharge status: Home / Self Care | Payer: PPO | Source: Ambulatory Visit | Attending: Cardiothoracic Surgery | Admitting: Cardiothoracic Surgery

## 2020-03-05 VITALS — BP 158/69 | HR 55 | Temp 97.7°F | Resp 16 | Ht 63.0 in | Wt 140.3 lb

## 2020-03-05 DIAGNOSIS — Z951 Presence of aortocoronary bypass graft: Secondary | ICD-10-CM

## 2020-03-05 DIAGNOSIS — J90 Pleural effusion, not elsewhere classified: Secondary | ICD-10-CM | POA: Diagnosis not present

## 2020-03-05 DIAGNOSIS — I25118 Atherosclerotic heart disease of native coronary artery with other forms of angina pectoris: Secondary | ICD-10-CM

## 2020-03-05 NOTE — Telephone Encounter (Signed)
Called to schedule 1 month f/u with Dr. Debbrah Alar, no answer, left vm. AW

## 2020-03-06 ENCOUNTER — Other Ambulatory Visit: Payer: Self-pay

## 2020-03-06 NOTE — Progress Notes (Signed)
[address]       [place] [address]             [phone]     CARDIOTHORACIC SURGERY OFFICE NOTE  Referring Provider is Belva Crome, MD Primary Cardiologist is Evalina Field, MD PCP is [name], MD   HPI:  79 year old lady underwent coronary artery bypass grafting on 01/27/2020.  She did well initially but unfortunately had a stroke 2 days after surgery.  This required extensive additional ICU care and eventual rehab.  She presents for her first outpatient visit since discharge from the hospital.  She states that she is doing much better and is improving her exercise capacity.  She denies chest pain or shortness of breath.   Current Outpatient Medications  Medication Sig Dispense Refill  . acetaminophen (TYLENOL) 500 MG tablet Take 1,000 mg by mouth in the morning and at bedtime.     Marland Kitchen apixaban (ELIQUIS) 5 MG TABS tablet Take 1 tablet (5 mg total) by mouth 2 (two) times daily. 60 tablet   . aspirin EC 81 MG tablet Take 81 mg by mouth at bedtime.    Marland Kitchen atorvastatin (LIPITOR) 80 MG tablet Take 80 mg by mouth daily at 12 noon.     . cholecalciferol (VITAMIN D) 1000 UNITS tablet Take 1,000 Units by mouth daily at 12 noon.     . fluticasone (FLONASE) 50 MCG/ACT nasal spray Place 2 sprays into both nostrils daily.    . isosorbide mononitrate (IMDUR) 30 MG 24 hr tablet Take 0.5 tablets (15 mg total) by mouth daily.    Marland Kitchen levothyroxine (SYNTHROID, LEVOTHROID) 200 MCG tablet Take 100-200 mcg by mouth See admin instructions. Take 200 mcg  SUN-TUES-THUR-SAT Take 100 mcg tab MON-WED-FRI  DO NOT USE GENERIC    . lisinopril (ZESTRIL) 20 MG tablet Take 20 mg by mouth daily.     . metoprolol tartrate (LOPRESSOR) 25 MG tablet Take 1 tablet (25 mg total) by mouth 2 (two) times daily. (Patient taking differently: Take 25 mg by mouth 2 (two) times daily after a meal. ) 180 tablet 1  . nitroGLYCERIN (NITROSTAT) 0.4 MG SL tablet Place 1 tablet (0.4 mg total) under the  tongue every 5 (five) minutes as needed for chest pain. 30 tablet 5  . Prenatal Vit-Fe Fumarate-FA (PRENATAL MULTIVITAMIN) TABS tablet Take 1 tablet by mouth daily at 12 noon.    . sertraline (ZOLOFT) 50 MG tablet Take 1 tablet (50 mg total) by mouth at bedtime.    . vitamin B-12 (CYANOCOBALAMIN) 1000 MCG tablet Take 2,500 mcg by mouth daily.     No current facility-administered medications for this visit.      Physical Exam:   BP (!) 158/69 (BP Location: Left Arm, Patient Position: Sitting, Cuff Size: Normal)   Pulse (!) 55   Temp 97.7 F (36.5 C)   Resp 16   Ht 5\' 3"  (1.6 m)   Wt 63.6 kg   SpO2 94% Comment: ON RA  BMI 24.85 kg/m   General:  Well-appearing no acute distress  Chest:   Clear to auscultation  CV:   Regular rate and rhythm  Incisions:  Well-healed  Abdomen:  Soft  Extremities:  No edema  Diagnostic Tests:  Chest x-ray with clear lung fields   Impression:  Doing well after CABG and subsequent postoperative CVA  Plan:  Follow up as needed with thoracic surgery Continue stroke rehabilitation Suggest sports bra for additional sternal support  I spent in excess of 15 minutes during the conduct of this office consultation and >50% of this time involved direct face-to-face encounter with the patient for counseling and/or coordination of their care.  Level 2                 10 minutes Level 3                 15 minutes Level 4                 25 minutes Level 5                 40 minutes  B. Murvin Natal, MD 03/06/2020 7:07 PM

## 2020-03-08 ENCOUNTER — Ambulatory Visit: Payer: PPO | Admitting: Physician Assistant

## 2020-03-11 DIAGNOSIS — H9193 Unspecified hearing loss, bilateral: Secondary | ICD-10-CM | POA: Diagnosis not present

## 2020-03-11 DIAGNOSIS — I10 Essential (primary) hypertension: Secondary | ICD-10-CM | POA: Diagnosis not present

## 2020-03-11 DIAGNOSIS — Z86711 Personal history of pulmonary embolism: Secondary | ICD-10-CM | POA: Diagnosis not present

## 2020-03-11 DIAGNOSIS — I69354 Hemiplegia and hemiparesis following cerebral infarction affecting left non-dominant side: Secondary | ICD-10-CM | POA: Diagnosis not present

## 2020-03-11 DIAGNOSIS — D649 Anemia, unspecified: Secondary | ICD-10-CM | POA: Diagnosis not present

## 2020-03-11 DIAGNOSIS — Z86011 Personal history of benign neoplasm of the brain: Secondary | ICD-10-CM | POA: Diagnosis not present

## 2020-03-11 DIAGNOSIS — I69320 Aphasia following cerebral infarction: Secondary | ICD-10-CM | POA: Diagnosis not present

## 2020-03-11 DIAGNOSIS — E785 Hyperlipidemia, unspecified: Secondary | ICD-10-CM | POA: Diagnosis not present

## 2020-03-11 DIAGNOSIS — I69391 Dysphagia following cerebral infarction: Secondary | ICD-10-CM | POA: Diagnosis not present

## 2020-03-11 DIAGNOSIS — R1310 Dysphagia, unspecified: Secondary | ICD-10-CM | POA: Diagnosis not present

## 2020-03-11 DIAGNOSIS — I2510 Atherosclerotic heart disease of native coronary artery without angina pectoris: Secondary | ICD-10-CM | POA: Diagnosis not present

## 2020-03-11 DIAGNOSIS — Z85850 Personal history of malignant neoplasm of thyroid: Secondary | ICD-10-CM | POA: Diagnosis not present

## 2020-03-11 DIAGNOSIS — E039 Hypothyroidism, unspecified: Secondary | ICD-10-CM | POA: Diagnosis not present

## 2020-03-14 DIAGNOSIS — E559 Vitamin D deficiency, unspecified: Secondary | ICD-10-CM | POA: Diagnosis not present

## 2020-03-14 DIAGNOSIS — I2699 Other pulmonary embolism without acute cor pulmonale: Secondary | ICD-10-CM | POA: Diagnosis not present

## 2020-03-14 DIAGNOSIS — I10 Essential (primary) hypertension: Secondary | ICD-10-CM | POA: Diagnosis not present

## 2020-03-14 DIAGNOSIS — D6489 Other specified anemias: Secondary | ICD-10-CM | POA: Diagnosis not present

## 2020-03-14 DIAGNOSIS — I639 Cerebral infarction, unspecified: Secondary | ICD-10-CM | POA: Diagnosis not present

## 2020-03-14 DIAGNOSIS — I82409 Acute embolism and thrombosis of unspecified deep veins of unspecified lower extremity: Secondary | ICD-10-CM | POA: Diagnosis not present

## 2020-03-14 DIAGNOSIS — I25810 Atherosclerosis of coronary artery bypass graft(s) without angina pectoris: Secondary | ICD-10-CM | POA: Diagnosis not present

## 2020-03-14 DIAGNOSIS — I6529 Occlusion and stenosis of unspecified carotid artery: Secondary | ICD-10-CM | POA: Diagnosis not present

## 2020-03-14 DIAGNOSIS — D497 Neoplasm of unspecified behavior of endocrine glands and other parts of nervous system: Secondary | ICD-10-CM | POA: Diagnosis not present

## 2020-03-14 DIAGNOSIS — E7849 Other hyperlipidemia: Secondary | ICD-10-CM | POA: Diagnosis not present

## 2020-03-14 DIAGNOSIS — I69391 Dysphagia following cerebral infarction: Secondary | ICD-10-CM | POA: Diagnosis not present

## 2020-03-14 DIAGNOSIS — E890 Postprocedural hypothyroidism: Secondary | ICD-10-CM | POA: Diagnosis not present

## 2020-03-19 ENCOUNTER — Other Ambulatory Visit: Payer: Self-pay

## 2020-03-19 ENCOUNTER — Ambulatory Visit (HOSPITAL_COMMUNITY)
Admission: RE | Admit: 2020-03-19 | Discharge: 2020-03-19 | Disposition: A | Discharge status: Home / Self Care | Payer: PPO | Source: Ambulatory Visit | Attending: Student | Admitting: Student

## 2020-03-19 DIAGNOSIS — Z85850 Personal history of malignant neoplasm of thyroid: Secondary | ICD-10-CM | POA: Diagnosis not present

## 2020-03-19 DIAGNOSIS — Z86711 Personal history of pulmonary embolism: Secondary | ICD-10-CM | POA: Diagnosis not present

## 2020-03-19 DIAGNOSIS — Z9889 Other specified postprocedural states: Secondary | ICD-10-CM | POA: Diagnosis not present

## 2020-03-19 DIAGNOSIS — I69391 Dysphagia following cerebral infarction: Secondary | ICD-10-CM | POA: Diagnosis not present

## 2020-03-19 DIAGNOSIS — I10 Essential (primary) hypertension: Secondary | ICD-10-CM | POA: Diagnosis not present

## 2020-03-19 DIAGNOSIS — D649 Anemia, unspecified: Secondary | ICD-10-CM | POA: Diagnosis not present

## 2020-03-19 DIAGNOSIS — I6602 Occlusion and stenosis of left middle cerebral artery: Secondary | ICD-10-CM | POA: Diagnosis not present

## 2020-03-19 DIAGNOSIS — I2510 Atherosclerotic heart disease of native coronary artery without angina pectoris: Secondary | ICD-10-CM | POA: Diagnosis not present

## 2020-03-19 DIAGNOSIS — E785 Hyperlipidemia, unspecified: Secondary | ICD-10-CM | POA: Diagnosis not present

## 2020-03-19 DIAGNOSIS — I69320 Aphasia following cerebral infarction: Secondary | ICD-10-CM | POA: Diagnosis not present

## 2020-03-19 DIAGNOSIS — H9193 Unspecified hearing loss, bilateral: Secondary | ICD-10-CM | POA: Diagnosis not present

## 2020-03-19 DIAGNOSIS — I639 Cerebral infarction, unspecified: Secondary | ICD-10-CM

## 2020-03-19 DIAGNOSIS — Z86011 Personal history of benign neoplasm of the brain: Secondary | ICD-10-CM | POA: Diagnosis not present

## 2020-03-19 DIAGNOSIS — R1310 Dysphagia, unspecified: Secondary | ICD-10-CM | POA: Diagnosis not present

## 2020-03-19 DIAGNOSIS — I69354 Hemiplegia and hemiparesis following cerebral infarction affecting left non-dominant side: Secondary | ICD-10-CM | POA: Diagnosis not present

## 2020-03-19 DIAGNOSIS — E039 Hypothyroidism, unspecified: Secondary | ICD-10-CM | POA: Diagnosis not present

## 2020-03-19 NOTE — Progress Notes (Signed)
Referring Physician(s): Louk,Alexandra M  Chief Complaint: The patient is seen in follow up today s/p Left MCA thrombectomy.  History of present illness: 79 year old female with past medical history of coronary artery disease, hypertension, hyperlipidemia and thyroid cancer who was admitted in March 2021 for three-vessel CABG performed on 01/27/2020. She had sudden right-sided weakness and was found to have a left M1/MCA occlusion. No IV tPA given due to recent surgery.She underwent emergency mechanical thrombectomy with complete revascularization. She had a significant recovery and presents to the office today for a follow-up visit.   Past Medical History:  Diagnosis Date  . Coronary artery disease   . Family history of adverse reaction to anesthesia    " My Sister did "  . HTN (hypertension)   . Hyperlipidemia   . Hypothyroidism   . Osteoporosis   . Thyroid carcinoma [facility])     Past Surgical History:  Procedure Laterality Date  . ABDOMINAL HYSTERECTOMY    . ANKLE SURGERY    . APPENDECTOMY    . CARDIAC CATHETERIZATION  01/25/2010  . CHOLECYSTECTOMY    . CORONARY ARTERY BYPASS GRAFT N/A 01/27/2020   Procedure: CORONARY ARTERY BYPASS GRAFTING (CABG) x 4 using LIMA to LAD; RIMA to OM1; Left radia sequential PDA and Left PL.;  Surgeon: [name], MD;  Location: [facility] OR;  Service: Open Heart Surgery;  Laterality: N/A;  BILATERAL IMA  . gamma knife    . IR CT HEAD LTD  01/30/2020  . IR PERCUTANEOUS ART THROMBECTOMY/INFUSION INTRACRANIAL INC DIAG ANGIO  01/30/2020  . LEFT HEART CATH AND CORONARY ANGIOGRAPHY N/A 01/26/2020   Procedure: LEFT HEART CATH AND CORONARY ANGIOGRAPHY;  Surgeon: Belva Crome, MD;  Location: [facility];  Service: Cardiovascular;  Laterality: N/A;  . RADIAL ARTERY HARVEST Left 01/27/2020   Procedure: RADIAL ARTERY HARVEST (OPEN HARVEST);  Surgeon: [name], MD;  Location: [place];  Service: Open Heart Surgery;  Laterality: Left;  . RADIOLOGY  WITH ANESTHESIA N/A 01/30/2020   Procedure: IR WITH ANESTHESIA;  Surgeon: Radiologist, Medication, MD;  Location: [facility];  Service: Radiology;  Laterality: N/A;  . TEE WITHOUT CARDIOVERSION N/A 01/27/2020   Procedure: TRANSESOPHAGEAL ECHOCARDIOGRAM (TEE);  Surgeon: [name], MD;  Location: [facility];  Service: Open Heart Surgery;  Laterality: N/A;  . THYROIDECTOMY    . TOTAL HIP ARTHROPLASTY      Allergies: Bee venom, Penicillins, Adhesive [tape], Morphine and related, and Sulfa antibiotics  Medications: Prior to Admission medications   Medication Sig Start Date End Date Taking? Authorizing Provider  acetaminophen (TYLENOL) 500 MG tablet Take 1,000 mg by mouth in the morning and at bedtime.     [provider]  apixaban (ELIQUIS) 5 MG TABS tablet Take 1 tablet (5 mg total) by mouth 2 (two) times daily. 02/20/20   Barrett, Lodema Hong, PA-C  aspirin EC 81 MG tablet Take 81 mg by mouth at bedtime.    [provider]  atorvastatin (LIPITOR) 80 MG tablet Take 80 mg by mouth daily at 12 noon.     [provider]  cholecalciferol (VITAMIN D) 1000 UNITS tablet Take 1,000 Units by mouth daily at 12 noon.     [provider]  fluticasone (FLONASE) 50 MCG/ACT nasal spray Place 2 sprays into both nostrils daily. 12/19/19   [provider]  isosorbide mononitrate (IMDUR) 30 MG 24 hr tablet Take 0.5 tablets (15 mg total) by mouth daily. 02/20/20   Barrett, Lodema Hong, PA-C  levothyroxine (SYNTHROID, LEVOTHROID) 200 MCG tablet Take 100-200 mcg by mouth See admin instructions. Take 200 mcg  SUN-TUES-THUR-SAT Take 100 mcg tab MON-WED-FRI  DO NOT USE GENERIC    [provider]  lisinopril (ZESTRIL) 20 MG tablet Take 20 mg by mouth daily.     [provider]  metoprolol tartrate (LOPRESSOR) 25 MG tablet Take 1 tablet (25 mg total) by mouth 2 (two) times daily. Patient taking differently: Take 25 mg by mouth 2 (two) times daily after a meal.  01/04/20 04/03/20   [name], [name], MD  nitroGLYCERIN (NITROSTAT) 0.4 MG SL tablet Place 1 tablet (0.4 mg total) under the tongue every 5 (five) minutes as needed for chest pain. 11/22/19 03/05/20  O'NealCassie Freer, MD  Prenatal Vit-Fe Fumarate-FA (PRENATAL MULTIVITAMIN) TABS tablet Take 1 tablet by mouth daily at 12 noon.    [provider]  sertraline (ZOLOFT) 50 MG tablet Take 1 tablet (50 mg total) by mouth at bedtime. 02/20/20   Barrett, Erin R, PA-C  vitamin B-12 (CYANOCOBALAMIN) 1000 MCG tablet Take 2,500 mcg by mouth daily.    [provider]     Family History  Problem Relation Age of Onset  . Breast cancer Mother 27  . Hypertension Mother   . Diabetes Mother   . Heart disease Mother   . Cirrhosis Father   . Cancer Brother   . Heart disease Brother   . Heart attack Sister     Social History   Socioeconomic History  . Marital status: Divorced    Spouse name: Not on file  . Number of children: Not on file  . Years of education: Not on file  . Highest education level: Not on file  Occupational History  . Occupation: retired Designer, jewellery  Tobacco Use  . Smoking status: Never Smoker  . Smokeless tobacco: Never Used  Substance and Sexual Activity  . Alcohol use: Not Currently  . Drug use: Never  . Sexual activity: Not on file  Other Topics Concern  . Not on file  Social History Narrative  . Not on file   Social Determinants of Health   Financial Resource Strain:   . Difficulty of Paying Living Expenses:   Food Insecurity:   . Worried About Charity fundraiser in the Last Year:   . Arboriculturist in the Last Year:   Transportation Needs:   . Film/video editor (Medical):   Marland Kitchen Lack of Transportation (Non-Medical):   Physical Activity:   . Days of Exercise per Week:   . Minutes of Exercise per Session:   Stress:   . Feeling of Stress :   Social Connections:   . Frequency of Communication with Friends and Family:   . Frequency of Social  Gatherings with Friends and Family:   . Attends Religious Services:   . Active Member of Clubs or Organizations:   . Attends Archivist Meetings:   Marland Kitchen Marital Status:      Vital Signs: BP: 169 x 74 mmHg left arm; SpO2: 96%; Pulse 62 bpm.  Physical Exam Constitutional:      Appearance: Normal appearance. She is normal weight.  Cardiovascular:     Pulses: Normal pulses.     Heart sounds: Normal heart sounds.  Pulmonary:     Effort: Pulmonary effort is normal.     Breath sounds: Normal breath sounds.  Skin:    General: Skin is warm and dry.  Comments: Angiogram incision site healed, soft and dry.  Neurological:     Mental Status: She is alert and oriented to person, place, and time.     Motor: Weakness present.     Comments: RUE4+/5  Psychiatric:        Mood and Affect: Mood normal.     Imaging: No results found.  Labs:  CBC: Recent Labs    02/14/20 0336 02/16/20 0725 02/17/20 0551 02/18/20 0430  WBC 9.9 7.7 7.7 8.2  HGB 9.8* 8.6* 8.5* 8.7*  HCT 31.6* 27.1* 27.0* 27.8*  PLT 421* 354 354 356    COAGS: Recent Labs    01/27/20 0524 01/27/20 2048 01/28/20 0625 01/30/20 0955  INR 1.0 1.5* 1.1 1.1  APTT 76* 33 30  --     BMP: Recent Labs    02/10/20 0922 02/11/20 0422 02/14/20 0336 02/16/20 0725  NA 138 137 138 137  K 3.7 4.2 4.5 4.6  CL 101 103 104 103  CO2 25 26 25 26   GLUCOSE 154* 136* 102* 126*  BUN 15 17 21 19   CALCIUM 9.1 9.3 9.9 9.6  CREATININE 0.86 0.71 0.75 0.77  GFRNONAA >60 >60 >60 >60  GFRAA >60 >60 >60 >60    LIVER FUNCTION TESTS: No results for input(s): BILITOT, AST, ALT, ALKPHOS, PROT, ALBUMIN in the last 8760 hours.  Assessment: [name] [name] has made an outstanding recovery. She is living independently and refers only current difficulties are mild expressive aphasia and mild dysgraphia, which are worse when she is tired. She does not need further follow-up with neurointerventional radiology at this  point. However, continued carotid artery disease surveillance is recommended.    Signed: Pedro Earls, MD 03/19/2020, 4:31 PM    I spent a total of   15 Minutes in face to face in clinical consultation, greater than 50% of which was counseling/coordinating care for left MCA stroke s/p mechanical thrombectomy.

## 2020-04-02 DIAGNOSIS — Z85850 Personal history of malignant neoplasm of thyroid: Secondary | ICD-10-CM | POA: Diagnosis not present

## 2020-04-02 DIAGNOSIS — E039 Hypothyroidism, unspecified: Secondary | ICD-10-CM | POA: Diagnosis not present

## 2020-04-02 DIAGNOSIS — E785 Hyperlipidemia, unspecified: Secondary | ICD-10-CM | POA: Diagnosis not present

## 2020-04-02 DIAGNOSIS — I69320 Aphasia following cerebral infarction: Secondary | ICD-10-CM | POA: Diagnosis not present

## 2020-04-02 DIAGNOSIS — I69354 Hemiplegia and hemiparesis following cerebral infarction affecting left non-dominant side: Secondary | ICD-10-CM | POA: Diagnosis not present

## 2020-04-02 DIAGNOSIS — I2510 Atherosclerotic heart disease of native coronary artery without angina pectoris: Secondary | ICD-10-CM | POA: Diagnosis not present

## 2020-04-02 DIAGNOSIS — Z86011 Personal history of benign neoplasm of the brain: Secondary | ICD-10-CM | POA: Diagnosis not present

## 2020-04-02 DIAGNOSIS — Z86711 Personal history of pulmonary embolism: Secondary | ICD-10-CM | POA: Diagnosis not present

## 2020-04-02 DIAGNOSIS — I69391 Dysphagia following cerebral infarction: Secondary | ICD-10-CM | POA: Diagnosis not present

## 2020-04-02 DIAGNOSIS — D649 Anemia, unspecified: Secondary | ICD-10-CM | POA: Diagnosis not present

## 2020-04-02 DIAGNOSIS — R1310 Dysphagia, unspecified: Secondary | ICD-10-CM | POA: Diagnosis not present

## 2020-04-02 DIAGNOSIS — H9193 Unspecified hearing loss, bilateral: Secondary | ICD-10-CM | POA: Diagnosis not present

## 2020-04-02 DIAGNOSIS — I10 Essential (primary) hypertension: Secondary | ICD-10-CM | POA: Diagnosis not present

## 2020-04-05 ENCOUNTER — Ambulatory Visit: Payer: PPO | Admitting: Physician Assistant

## 2020-04-05 ENCOUNTER — Encounter: Payer: Self-pay | Admitting: Physician Assistant

## 2020-04-05 ENCOUNTER — Other Ambulatory Visit: Payer: Self-pay

## 2020-04-05 VITALS — BP 128/62 | HR 61 | Ht 63.0 in | Wt 136.8 lb

## 2020-04-05 DIAGNOSIS — E039 Hypothyroidism, unspecified: Secondary | ICD-10-CM

## 2020-04-05 DIAGNOSIS — I10 Essential (primary) hypertension: Secondary | ICD-10-CM

## 2020-04-05 DIAGNOSIS — I25810 Atherosclerosis of coronary artery bypass graft(s) without angina pectoris: Secondary | ICD-10-CM

## 2020-04-05 DIAGNOSIS — I639 Cerebral infarction, unspecified: Secondary | ICD-10-CM | POA: Diagnosis not present

## 2020-04-05 DIAGNOSIS — E785 Hyperlipidemia, unspecified: Secondary | ICD-10-CM | POA: Diagnosis not present

## 2020-04-05 DIAGNOSIS — I2699 Other pulmonary embolism without acute cor pulmonale: Secondary | ICD-10-CM

## 2020-04-05 MED ORDER — RIVAROXABAN 20 MG PO TABS: 20.0000 mg | ORAL_TABLET | Freq: Every day | ORAL | 11 refills | Status: DC

## 2020-04-05 NOTE — Progress Notes (Signed)
Cardiology Office Note:    Date:  04/07/2020   ID:  [name], DOB 02-16-1941, MRN [id number]  PCP:  [name], MD  Cardiologist:  Evalina Field, MD  Electrophysiologist:  None   Referring MD: [name], MD   Chief Complaint  Patient presents with  . Hospitalization Follow-up    seen for Dr. Audie Box    History of Present Illness:    [name] [name] is a 79 y.o. 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  Coronary CT obtained on 01/19/2020 showed small PFO, coronary calcium score of 1458, this placed the patient at 37 percentile for age and sex matched control, severe CAD involving left main, mid LAD, proximal RCA and moderate disease in the mid left circumflex vessel.  Patient was subsequently seen by Dr. Audie Box who recommend outpatient cardiac catheterization.  Plan outpatient procedure was performed on 01/26/2020 that showed 70% ostial left main, 80% mid LAD, 80% proximal to mid left circumflex lesion, occluded mid small RCA with left to right collaterals.  EF 60%, LVEDP 18 mmHg.  Patient subsequently underwent CABG x4 with LIMA to LAD, sequential left radial to right posterior descending coronary artery and left PLA, RIMA to OM1 by Dr. Julien Girt on 01/27/2020.  Postop course was complicated by occurrence of left MCA CVA occurred on postop day 3.  She underwent mechanical thrombectomy by interventional radiology service.  She was also discovered to have bilateral PE as well and was treated with IV heparin.  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  Patient was later extubated on 3/18 however needed to be intubated on the same day due to inability to clear secretion.  Tracheostomy was performed at that site, this was  later downsized and fitted with Passy-Muir valve.  Regular diet was resumed on 02/17/2020 after she passed barium swallow test.  Lovenox was switched to Eliquis before discharge to skilled nursing facility.  After discharge, patient stayed in the skilled nursing facility until April 13 at which time she was discharged home.  She did continue with home PT and speech therapy.  She was seen by her PCP near the end of April who noted she was no longer on Eliquis at the time.  She was seen instead of started on Xarelto 20 mg daily.  Since her largest meal of the day is breakfast, she has been taking Xarelto every morning.  She is still on aspirin.  She has self reduced her Zoloft to 25 mg daily.  Otherwise her blood pressure is very well controlled on the current therapy.  I will see if we can get her a 30-day coupon card for the Xarelto.  Otherwise she can follow-up with Dr. Audie Box in 2 to 3 months.  Her surgical scar looks very well-healed.  She denies any recent chest pain, lower extremity edema, follow-up now or PND.  Her memory and speech is still issues.  It takes a long time for her to recall certain things.  However she says her speech has recovered about 75% of her previous baseline.   Past Medical History:  Diagnosis Date  . Coronary artery disease   . Family history of adverse reaction to anesthesia    " My Sister did "  . HTN (hypertension)   . Hyperlipidemia   .  Hypothyroidism   . Osteoporosis   . 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 ABDOMINAL HYSTERECTOMY    . ANKLE SURGERY    . APPENDECTOMY    . CARDIAC CATHETERIZATION  01/25/2010  . CHOLECYSTECTOMY    . CORONARY ARTERY BYPASS GRAFT N/A 01/27/2020   Procedure: CORONARY ARTERY BYPASS GRAFTING (CABG) x 4 using LIMA to LAD; RIMA to OM1; Left radia sequential PDA and Left PL.;  Surgeon: [name], MD;  Location: [facility] OR;  Service: Open Heart Surgery;  Laterality: N/A;  BILATERAL IMA  .  gamma knife    . IR CT HEAD LTD  01/30/2020  . IR PERCUTANEOUS ART THROMBECTOMY/INFUSION INTRACRANIAL INC DIAG ANGIO  01/30/2020  . LEFT HEART CATH AND CORONARY ANGIOGRAPHY N/A 01/26/2020   Procedure: LEFT HEART CATH AND CORONARY ANGIOGRAPHY;  Surgeon: Belva Crome, MD;  Location: [place];  Service: Cardiovascular;  Laterality: N/A;  . RADIAL ARTERY HARVEST Left 01/27/2020   Procedure: RADIAL ARTERY HARVEST (OPEN HARVEST);  Surgeon: [name], MD;  Location: [place];  Service: Open Heart Surgery;  Laterality: Left;  . RADIOLOGY WITH ANESTHESIA N/A 01/30/2020   Procedure: IR WITH ANESTHESIA;  Surgeon: Radiologist, Medication, MD;  Location: [facility];  Service: Radiology;  Laterality: N/A;  . TEE WITHOUT CARDIOVERSION N/A 01/27/2020   Procedure: TRANSESOPHAGEAL ECHOCARDIOGRAM (TEE);  Surgeon: [name], MD;  Location: [facility];  Service: Open Heart Surgery;  Laterality: N/A;  . THYROIDECTOMY    . TOTAL HIP ARTHROPLASTY      Current Medications: Current Meds  Medication Sig  . aspirin EC 81 MG tablet Take 81 mg by mouth at bedtime.  Marland Kitchen atorvastatin (LIPITOR) 80 MG tablet Take 80 mg by mouth daily at 12 noon.   . cholecalciferol (VITAMIN D) 1000 UNITS tablet Take 1,000 Units by mouth daily at 12 noon.   . fluticasone (FLONASE) 50 MCG/ACT nasal spray Place 2 sprays into both nostrils daily.  . isosorbide mononitrate (IMDUR) 30 MG 24 hr tablet Take 0.5 tablets (15 mg total) by mouth daily.  Marland Kitchen levothyroxine (SYNTHROID, LEVOTHROID) 200 MCG tablet Take 100-200 mcg by mouth See admin instructions. Take 200 mcg  SUN-TUES-THUR-SAT Take 100 mcg tab MON-WED-FRI  DO NOT USE GENERIC  . lisinopril (ZESTRIL) 20 MG tablet Take 20 mg by mouth daily.   . metoprolol tartrate (LOPRESSOR) 25 MG tablet Take 1 tablet (25 mg total) by mouth 2 (two) times daily. (Patient taking differently: Take 25 mg by mouth 2 (two) times daily after a meal. )  . nitroGLYCERIN (NITROSTAT) 0.4 MG SL tablet Place  under the tongue.  . Prenatal Vit-Fe Fumarate-FA (PRENATAL MULTIVITAMIN) TABS tablet Take 1 tablet by mouth daily at 12 noon.  . rivaroxaban (XARELTO) 20 MG TABS tablet Take 1 tablet (20 mg total) by mouth daily with supper.  . sertraline (ZOLOFT) 50 MG tablet Take 1 tablet (50 mg total) by mouth at bedtime. (Patient taking differently: Take 25 mg by mouth at bedtime. )  . vitamin B-12 (CYANOCOBALAMIN) 1000 MCG tablet Take 2,500 mcg by mouth daily.  . [DISCONTINUED] rivaroxaban (XARELTO) 20 MG TABS tablet Take 20 mg by mouth daily with supper.     Allergies:   Bee venom, Penicillins, Adhesive [tape], Morphine and related, and Sulfa antibiotics   Social History   Socioeconomic History  . Marital status: Divorced    Spouse name: Not on file  . Number of children: Not on  file  . Years of education: Not on file  . Highest education level: Not on file  Occupational History  . Occupation: retired Designer, jewellery  Tobacco Use  . Smoking status: Never Smoker  . Smokeless tobacco: Never Used  Substance and Sexual Activity  . Alcohol use: Not Currently  . Drug use: Never  . Sexual activity: Not on file  Other Topics Concern  . Not on file  Social History Narrative  . Not on file   Social Determinants of Health   Financial Resource Strain:   . Difficulty of Paying Living Expenses:   Food Insecurity:   . Worried About Charity fundraiser in the Last Year:   . Arboriculturist in the Last Year:   Transportation Needs:   . Film/video editor (Medical):   Marland Kitchen Lack of Transportation (Non-Medical):   Physical Activity:   . Days of Exercise per Week:   . Minutes of Exercise per Session:   Stress:   . Feeling of Stress :   Social Connections:   . Frequency of Communication with Friends and Family:   . Frequency of Social Gatherings with Friends and Family:   . Attends Religious Services:   . Active Member of Clubs or Organizations:   . Attends Archivist Meetings:   Marland Kitchen  Marital Status:      Family History: The patient's family history includes Breast cancer (age of onset: 47) in her mother; Cancer in her brother; Cirrhosis in her father; Diabetes in her mother; Heart attack in her sister; Heart disease in her brother and mother; Hypertension in her mother.  ROS:   Please see the history of present illness.     All other systems reviewed and are negative.  EKGs/Labs/Other Studies Reviewed:    The following studies were reviewed today:  Cath 01/26/2020  Severe multivessel coronary disease.  Significant mitral annular calcification  65 to 70% tubular proximal to distal left main.  Pressure damping and ventricularization with engagement using a 5 French catheter.  Proximal to mid 70 to 80% LAD.  It is large distally and a favorable target  Circumflex is codominant in origin to at least 4 obtuse marginal branches.  The first obtuse marginal contains irregularities up to 50% of the proximal segment.  The circumflex proper contains eccentric calcified 75 to 80% narrowing.  The right coronary is codominant, contains ostial disease, and is totally occluded in the mid vessel after the origin of the right ventricular acute marginal branch.  The distal vessel fills by right to right collaterals via an acute marginal branch and also from LAD to acute marginal branch.  PDA and inferior document is small.  Inferobasal hypokinesis.  EF 55%.  LVEDP 18 mmHg.  RECOMMENDATIONS:   After discussion, the decision was made to keep the patient in hospital, have her expediently evaluated by cardiac surgery, and to move forward with coronary bypass grafting as soon as possible.  Continue aspirin.  Add long-acting nitrates for anti-ischemic effect and to help with blood pressure.  Continue beta-blocker therapy.  IV heparin will be added after sheath pull and hemostasis established for at least 8 hours.  EKG:  EKG is ordered today.  The ekg ordered today demonstrates  normal sinus rhythm, very low voltage on the inferior lead, Q-wave in the inferior leads as well.  Recent Labs: 02/06/2020: Magnesium 1.8 02/16/2020: BUN 19; Creatinine, Ser 0.77; Potassium 4.6; Sodium 137 02/18/2020: Hemoglobin 8.7; Platelets 356  Recent Lipid Panel  Component Value Date/Time   CHOL 81 01/31/2020 0550   TRIG 90 01/31/2020 0550   HDL 35 (L) 01/31/2020 0550   CHOLHDL 2.3 01/31/2020 0550   VLDL 18 01/31/2020 0550   LDLCALC 28 01/31/2020 0550    Physical Exam:    VS:  BP 128/62   Pulse 61   Ht 5\' 3"  (1.6 m)   Wt 136 lb 12.8 oz (62.1 kg)   SpO2 97%   BMI 24.23 kg/m     Wt Readings from Last 3 Encounters:  04/05/20 136 lb 12.8 oz (62.1 kg)  03/05/20 140 lb 4.8 oz (63.6 kg)  02/21/20 139 lb 9.6 oz (63.3 kg)     GEN:  Well nourished, well developed in no acute distress HEENT: Normal NECK: No JVD; No carotid bruits LYMPHATICS: No lymphadenopathy CARDIAC: RRR, no murmurs, rubs, gallops.  Sternotomy scar well-healed RESPIRATORY:  Clear to auscultation without rales, wheezing or rhonchi  ABDOMEN: Soft, non-tender, non-distended MUSCULOSKELETAL:  No edema; No deformity  SKIN: Warm and dry NEUROLOGIC:  Alert and oriented x 3 PSYCHIATRIC:  Normal affect   ASSESSMENT:    1. Coronary artery disease involving coronary bypass graft of native heart without angina pectoris   2. Cerebrovascular accident (CVA), unspecified mechanism ([place])   3. Essential hypertension   4. Hyperlipidemia LDL goal <70   5. Hypothyroidism, unspecified type   6. Other acute pulmonary embolism without acute cor pulmonale (HCC)    PLAN:    In order of problems listed above:  1. CAD s/p CABG: Sternotomy scar is well-healed on exam.  She is on aspirin 81 mg due to the need for Xarelto.  Ejection fraction was initially low around 30% on 01/28/2020, however repeat limited echo obtained on 02/06/2020 showed normalization of ejection fraction.  2. Recent CVA: Occurred on postop day #3 after  bypass surgery.  Her memory and speech remain issue at this point.  3. Hypertension: Blood pressure stable  4. Hyperlipidemia: Continue Lipitor 80 mg daily  5. Hypothyroidism: On levothyroxine  6. Recent PE: Postop course was complicated by both stroke and PE.  She has been placed on Xarelto.  Hypercoagulation panel obtained on 01/30/2020 were negative despite unusual nature of her PE and stroke.    Medication Adjustments/Labs and Tests Ordered: Current medicines are reviewed at length with the patient today.  Concerns regarding medicines are outlined above.  Orders Placed This Encounter  Procedures  . EKG 12-Lead   Meds ordered this encounter  Medications  . rivaroxaban (XARELTO) 20 MG TABS tablet    Sig: Take 1 tablet (20 mg total) by mouth daily with supper.    Dispense:  30 tablet    Refill:  11    Patient Instructions  Medication Instructions:  Your physician recommends that you continue on your current medications as directed. Please refer to the Current Medication list given to you today.  *If you need a refill on your cardiac medications before your next appointment, please call your pharmacy*  Lab Work: NONE ordered at this time of appointment   If you have labs (blood work) drawn today and your tests are completely normal, you will receive your results only by: Marland Kitchen MyChart Message (if you have MyChart) OR . A paper copy in the mail If you have any lab test that is abnormal or we need to change your treatment, we will call you to review the results.  Testing/Procedures: NONE ordered at this time of appointment   Follow-Up: At [facility],  you and your health needs are our priority.  As part of our continuing mission to provide you with exceptional heart care, we have created designated Provider Care Teams.  These Care Teams include your primary Cardiologist (physician) and Advanced Practice Providers (APPs -  Physician Assistants and Nurse Practitioners) who all  work together to provide you with the care you need, when you need it.  Your next appointment:   2-3 month(s)  The format for your next appointment:   In Person  Provider:   Eleonore Chiquito, MD  Other Instructions      Signed, Almyra Deforest, [place]  04/07/2020 11:34 PM    [place]

## 2020-04-05 NOTE — Patient Instructions (Signed)
Medication Instructions:  Your physician recommends that you continue on your current medications as directed. Please refer to the Current Medication list given to you today.  *If you need a refill on your cardiac medications before your next appointment, please call your pharmacy*  Lab Work: NONE ordered at this time of appointment   If you have labs (blood work) drawn today and your tests are completely normal, you will receive your results only by: Marland Kitchen MyChart Message (if you have MyChart) OR . A paper copy in the mail If you have any lab test that is abnormal or we need to change your treatment, we will call you to review the results.  Testing/Procedures: NONE ordered at this time of appointment   Follow-Up: At [facility], you and your health needs are our priority.  As part of our continuing mission to provide you with exceptional heart care, we have created designated Provider Care Teams.  These Care Teams include your primary Cardiologist (physician) and Advanced Practice Providers (APPs -  Physician Assistants and Nurse Practitioners) who all work together to provide you with the care you need, when you need it.  Your next appointment:   2-3 month(s)  The format for your next appointment:   In Person  Provider:   Eleonore Chiquito, MD  Other Instructions

## 2020-04-07 ENCOUNTER — Encounter: Payer: Self-pay | Admitting: Physician Assistant

## 2020-04-13 DIAGNOSIS — H903 Sensorineural hearing loss, bilateral: Secondary | ICD-10-CM | POA: Diagnosis not present

## 2020-04-14 ENCOUNTER — Other Ambulatory Visit: Payer: Self-pay

## 2020-04-14 ENCOUNTER — Emergency Department (HOSPITAL_COMMUNITY)
Admission: EM | Admit: 2020-04-14 | Discharge: 2020-04-15 | Disposition: A | Discharge status: Home / Self Care | Payer: PPO | Attending: Emergency Medicine | Admitting: Emergency Medicine

## 2020-04-14 ENCOUNTER — Encounter (HOSPITAL_COMMUNITY): Payer: Self-pay | Admitting: *Deleted

## 2020-04-14 DIAGNOSIS — N390 Urinary tract infection, site not specified: Secondary | ICD-10-CM | POA: Insufficient documentation

## 2020-04-14 DIAGNOSIS — R072 Precordial pain: Secondary | ICD-10-CM | POA: Diagnosis not present

## 2020-04-14 DIAGNOSIS — R0789 Other chest pain: Secondary | ICD-10-CM | POA: Diagnosis not present

## 2020-04-14 DIAGNOSIS — Z951 Presence of aortocoronary bypass graft: Secondary | ICD-10-CM | POA: Insufficient documentation

## 2020-04-14 DIAGNOSIS — R2681 Unsteadiness on feet: Secondary | ICD-10-CM | POA: Diagnosis not present

## 2020-04-14 DIAGNOSIS — M6281 Muscle weakness (generalized): Secondary | ICD-10-CM | POA: Insufficient documentation

## 2020-04-14 DIAGNOSIS — R262 Difficulty in walking, not elsewhere classified: Secondary | ICD-10-CM | POA: Diagnosis not present

## 2020-04-14 DIAGNOSIS — Z79899 Other long term (current) drug therapy: Secondary | ICD-10-CM | POA: Insufficient documentation

## 2020-04-14 DIAGNOSIS — I10 Essential (primary) hypertension: Secondary | ICD-10-CM | POA: Diagnosis not present

## 2020-04-14 DIAGNOSIS — I2510 Atherosclerotic heart disease of native coronary artery without angina pectoris: Secondary | ICD-10-CM | POA: Diagnosis not present

## 2020-04-14 DIAGNOSIS — R079 Chest pain, unspecified: Secondary | ICD-10-CM | POA: Diagnosis not present

## 2020-04-14 DIAGNOSIS — R531 Weakness: Secondary | ICD-10-CM | POA: Diagnosis not present

## 2020-04-14 DIAGNOSIS — I119 Hypertensive heart disease without heart failure: Secondary | ICD-10-CM | POA: Diagnosis not present

## 2020-04-14 DIAGNOSIS — R0602 Shortness of breath: Secondary | ICD-10-CM | POA: Diagnosis not present

## 2020-04-14 NOTE — ED Triage Notes (Signed)
The pts speech is slow and she appears to be having difficulty  Concentrating  She also mentioned  Maybe a clot removed from her brain ?? Unsure of time

## 2020-04-14 NOTE — ED Triage Notes (Signed)
The pt arrived by  [place] from home  Pt c/o chest pains some sl confusion   C/o pain in her rectum  Lt leg pain  c-spine discomfort lt leg pain

## 2020-04-15 ENCOUNTER — Emergency Department (HOSPITAL_COMMUNITY): Payer: PPO

## 2020-04-15 DIAGNOSIS — N390 Urinary tract infection, site not specified: Secondary | ICD-10-CM | POA: Diagnosis not present

## 2020-04-15 DIAGNOSIS — R079 Chest pain, unspecified: Secondary | ICD-10-CM | POA: Diagnosis not present

## 2020-04-15 DIAGNOSIS — R072 Precordial pain: Secondary | ICD-10-CM | POA: Diagnosis not present

## 2020-04-15 LAB — BASIC METABOLIC PANEL
Anion gap: 14 (ref 5–15)
BUN: 18 mg/dL (ref 8–23)
CO2: 21 mmol/L — ABNORMAL LOW (ref 22–32)
Calcium: 11.2 mg/dL — ABNORMAL HIGH (ref 8.9–10.3)
Chloride: 104 mmol/L (ref 98–111)
Creatinine, Ser: 0.96 mg/dL (ref 0.44–1.00)
GFR calc Af Amer: >60 mL/min (ref >60)
GFR calc non Af Amer: 57 mL/min — ABNORMAL LOW (ref >60)
Glucose, Bld: 103 mg/dL — ABNORMAL HIGH (ref 70–99)
Potassium: 4.5 mmol/L (ref 3.5–5.1)
Sodium: 139 mmol/L (ref 135–145)

## 2020-04-15 LAB — URINALYSIS, ROUTINE W REFLEX MICROSCOPIC
Bilirubin Urine: NEGATIVE
Glucose, UA: NEGATIVE mg/dL
Ketones, ur: 5 mg/dL — AB
Nitrite: NEGATIVE
Protein, ur: 100 mg/dL — AB
Specific Gravity, Urine: 1.017 (ref 1.005–1.030)
WBC, UA: >50 WBC/hpf — ABNORMAL HIGH (ref 0–5)
pH: 5 (ref 5.0–8.0)

## 2020-04-15 LAB — CBC
HCT: 40.3 % (ref 36.0–46.0)
Hemoglobin: 12.6 g/dL (ref 12.0–15.0)
MCH: 26.6 pg (ref 26.0–34.0)
MCHC: 31.3 g/dL (ref 30.0–36.0)
MCV: 85.2 fL (ref 80.0–100.0)
Platelets: 343 10*3/uL (ref 150–400)
RBC: 4.73 MIL/uL (ref 3.87–5.11)
RDW: 15.6 % — ABNORMAL HIGH (ref 11.5–15.5)
WBC: 15.6 10*3/uL — ABNORMAL HIGH (ref 4.0–10.5)
nRBC: 0 % (ref 0.0–0.2)

## 2020-04-15 LAB — TROPONIN I (HIGH SENSITIVITY)
Troponin I (High Sensitivity): 11 ng/L (ref <18)
Troponin I (High Sensitivity): 10 ng/L (ref <18)

## 2020-04-15 MED ORDER — SODIUM CHLORIDE 0.9 % IV BOLUS
500.0000 mL | Freq: Once | INTRAVENOUS | Status: AC
  Administered 2020-04-15: 500 mL via INTRAVENOUS

## 2020-04-15 MED ORDER — ACETAMINOPHEN 325 MG PO TABS
650.0000 mg | ORAL_TABLET | Freq: Once | ORAL | Status: AC
  Administered 2020-04-15: 650 mg via ORAL
  Filled 2020-04-15: qty 2

## 2020-04-15 MED ORDER — SODIUM CHLORIDE 0.9 % IV SOLN
1.0000 g | Freq: Once | INTRAVENOUS | Status: AC
  Administered 2020-04-15: 1 g via INTRAVENOUS
  Filled 2020-04-15: qty 10

## 2020-04-15 MED ORDER — CEPHALEXIN 500 MG PO CAPS: 500.0000 mg | ORAL_CAPSULE | Freq: Three times a day (TID) | ORAL | 0 refills | Status: DC

## 2020-04-15 MED ORDER — SODIUM CHLORIDE 0.9% FLUSH: 3.0000 mL | Freq: Once | INTRAVENOUS | Status: DC

## 2020-04-15 NOTE — Progress Notes (Signed)
Pt was evaluated and noted her assistance needed for getting OOB and to walk on the hallway.  Her plan is to go directly home with expectation that she is not interested in having help there to stay, but will expect family and friends to drop by and assist her.  Discussed safety with her and to consider rehab stay, but pt has already been discharged at the time of this note to go home.     04/15/20 1200  PT Visit Information  Last PT Received On 04/15/20  Assistance Needed +1  History of Present Illness 79 y.o. female was brought to ED, demonstrating chest pain over last few days.  Not worsened by exertion but is reporting being less able to move her legs now.  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 ETT 3/12-3/13 (for procedure), 3/15-3/19. 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  Precautions  Precautions Fall  Precaution Comments monitor O2 sats  Restrictions  Weight Bearing Restrictions No  Home Living  Family/patient expects to be discharged to: Private residence  Living Arrangements Alone  Available Help at Discharge Family;Available PRN/intermittently  Type of Home House  Home Access Stairs to enter  Entrance Stairs-Number of Steps 3-4 (per chart, pt states 2)  Entrance Stairs-Rails Right  Home Layout One level  [place] - 4 wheels;Cane - single point;Shower seat  Additional Comments pt is very focused on going home, refusing rehab  Prior Function  Level of Independence Independent with assistive device(s);Independent  Comments uses rollator and SPC as needed  Communication  Communication HOH  Pain Assessment  Pain Assessment No/denies pain  Cognition  Arousal/Alertness Awake/alert  Behavior During Therapy Flat affect  Overall Cognitive Status No family/caregiver present to determine baseline cognitive functioning  General Comments unclear if she is  baseline  Upper Extremity Assessment  Upper Extremity Assessment Generalized weakness  Lower Extremity Assessment  Lower Extremity Assessment Generalized weakness  Cervical / Trunk Assessment  Cervical / Trunk Assessment Kyphotic  Bed Mobility  Overal bed mobility Needs Assistance  Bed Mobility Supine to Sit;Sit to Supine  Supine to sit Mod assist  Sit to supine Min assist  General bed mobility comments mod to lift trunk with pt verbalizing she can do it alone  Transfers  Overall transfer level Needs assistance  Equipment used 1 person hand held assist  Transfers Sit to/from Stand;Stand Pivot Transfers  Sit to Stand Min assist  Stand pivot transfers Min assist  General transfer comment min assist for safety but pt will reach for furniture and try alone  Ambulation/Gait  Ambulation/Gait assistance Min assist;Min guard  Gait Distance (Feet) 100 Feet  Assistive device 1 person hand held assist  Gait Pattern/deviations Step-through pattern;Decreased stride length;Wide base of support  General Gait Details pt is unsteady with HHA used, asked PT to let go and she became more unsteady  Gait velocity reduced  Balance  Overall balance assessment Needs assistance  Sitting-balance support Feet supported  Sitting balance-Leahy Scale Fair  Standing balance support Single extremity supported  Standing balance-Leahy Scale Poor  Standing balance comment reaching for support or PT is holding onto her  General Comments  General comments (skin integrity, edema, etc.) pt is just unsteady enough to need either support at home or to get rehab, declining to consider a stay and clear she does not want anyone to be in her house with her  Exercises  Exercises Other exercises (3+ to 4- hips and hamstrings, 4/5 quads)  PT - End of Session  Equipment Utilized During Treatment Gait belt  Activity Tolerance Patient limited by fatigue;Other (comment) (weakness in LE's affecting safety and endurance)   Patient left in bed;with call bell/phone within reach  Nurse Communication Mobility status  PT Assessment  PT Recommendation/Assessment Patient needs continued PT services  PT Visit Diagnosis Unsteadiness on feet (R26.81);Muscle weakness (generalized) (M62.81);Difficulty in walking, not elsewhere classified (R26.2);Hemiplegia and hemiparesis  Hemiplegia - Right/Left Right  Hemiplegia - dominant/non-dominant Dominant  Hemiplegia - caused by Cerebral infarction  PT Problem List Decreased strength;Decreased range of motion;Decreased activity tolerance;Decreased balance;Decreased mobility;Decreased coordination  Barriers to Discharge Inaccessible home environment;Decreased caregiver support  Barriers to Discharge Comments stairs to enter house and home alone, declining to have in house help  PT Plan  PT Frequency (ACUTE ONLY) Min 3X/week  PT Treatment/Interventions (ACUTE ONLY) DME instruction;Gait training;Stair training;Functional mobility training;Therapeutic activities;Therapeutic exercise;Balance training;Neuromuscular re-education;Patient/family education  AM-PAC PT "6 Clicks" Mobility Outcome Measure (Version 2)  Help needed turning from your back to your side while in a flat bed without using bedrails? 3  Help needed moving from lying on your back to sitting on the side of a flat bed without using bedrails? 2  Help needed moving to and from a bed to a chair (including a wheelchair)? 3  Help needed standing up from a chair using your arms (e.g., wheelchair or bedside chair)? 3  Help needed to walk in hospital room? 3  Help needed climbing 3-5 steps with a railing?  1  6 Click Score 15  Consider Recommendation of Discharge To: CIR/SNF/LTACH  PT Recommendation  Follow Up Recommendations SNF;CIR;Supervision for mobility/OOB  PT equipment None recommended by PT  Individuals Consulted  Consulted and Agree with Results and Recommendations Other (Comment) (pt did not agree to having rehab  stay)  Acute Rehab PT Goals  Patient Stated Goal to get home asap  PT Goal Formulation With patient  Time For Goal Achievement 04/29/20  Potential to Achieve Goals Good  PT Time Calculation  PT Start Time (ACUTE ONLY) 1149  PT Stop Time (ACUTE ONLY) 1216  PT Time Calculation (min) (ACUTE ONLY) 27 min  PT General Charges  $$ ACUTE PT VISIT 1 Visit  PT Evaluation  $PT Eval Moderate Complexity 1 Mod  Written Expression  Dominant Hand Right    Mee Hives, PT MS Acute Rehab Dept. Number: [place] [facility] [place]

## 2020-04-15 NOTE — TOC Transition Note (Signed)
Transition of Care [facility]) - CM/SW Discharge Note   Patient Details  Name: [name] [name] MRN: [id number] Date of Birth: July 05, 1941  Transition of Care [facility] CM/SW Contact:  Verdell Carmine, RN Phone Number: 04/15/2020, 2:02 PM   Clinical Narrative:     Patient assessed by PT recommendation was for SNF/ rehab however the patient has refused this, stating also that she does not want anyone living with her. She has agreed to Home health, we will try to get maximum resources to her. Agreed to speak to Community link to speak to someone about resources, will get a Home for Mom to see what other resources may be available to check on her.     Barriers to Discharge: Continued Medical Work up   Patient Goals and CMS Choice    [facility], had them previously were happy with services    Discharge Placement  Home with [place]                     Discharge Plan and [place] with [place] and continued work on other resources.                                 Social Determinants of Health (SDOH) Interventions     Readmission Risk Interventions No flowsheet data found.

## 2020-04-15 NOTE — ED Provider Notes (Addendum)
[facility] EMERGENCY DEPARTMENT Provider Note   CSN: [id number] Arrival date & time: 04/14/20  1825     History Chief Complaint  Patient presents with  . Chest Pain    [name] [name] is a 79 y.o. female.  Patient c/o mid chest pain for the past couple days. Symptoms gradual onset, constant, dull, mild-mod, worse w certain movement, changes of position. No exertional cp or discomfort. No associated sob, nv or diaphoresis. Pain is not pleuritic. No leg swelling. +anticoag therapy. Pt denies cough or uri symptoms. No fever or chills. Patient s/p cabg 01/2020, with post op course complicated by cva, PE. Pt indicates compliant w home meds. Pt does also note relatively poor po intake in past few days. Denies abd pain. No vomiting or diarrhea. No gu c/o.   The history is provided by the patient.  Chest Pain Associated symptoms: no abdominal pain, no back pain, no cough, no fever, no headache, no numbness, no palpitations, no shortness of breath, no vomiting and no weakness        Past Medical History:  Diagnosis Date  . Coronary artery disease   . Family history of adverse reaction to anesthesia    " My Sister did "  . HTN (hypertension)   . Hyperlipidemia   . Hypothyroidism   . Osteoporosis   . Thyroid carcinoma [facility])     Patient Active Problem List   Diagnosis Date Noted  . Subglottic edema   . History of open heart surgery   . Pleural effusion on right   . Status post tracheostomy ([place])   . Stroke ([place])   . History of thyroid cancer   . Leukocytosis   . Acute blood loss anemia   . Acute hypoxemic respiratory failure ([place])   . Tracheostomy status ([place])   . 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 3-vessel CAD 01/26/2020  . Coronary artery disease of native artery of native heart with stable angina pectoris ([place])   . Precordial chest pain 06/15/2014  . Carotid stenosis 06/15/2014  . Essential hypertension, benign 06/15/2014    Past Surgical History:    Procedure Laterality Date  . ABDOMINAL HYSTERECTOMY    . ANKLE SURGERY    . APPENDECTOMY    . CARDIAC CATHETERIZATION  01/25/2010  . CHOLECYSTECTOMY    . CORONARY ARTERY BYPASS GRAFT N/A 01/27/2020   Procedure: CORONARY ARTERY BYPASS GRAFTING (CABG) x 4 using LIMA to LAD; RIMA to OM1; Left radia sequential PDA and Left PL.;  Surgeon: [name], MD;  Location: [facility];  Service: Open Heart Surgery;  Laterality: N/A;  BILATERAL IMA  . gamma knife    . IR CT HEAD LTD  01/30/2020  . IR PERCUTANEOUS ART THROMBECTOMY/INFUSION INTRACRANIAL INC DIAG ANGIO  01/30/2020  . LEFT HEART CATH AND CORONARY ANGIOGRAPHY N/A 01/26/2020   Procedure: LEFT HEART CATH AND CORONARY ANGIOGRAPHY;  Surgeon: Belva Crome, MD;  Location: [facility] CV LAB;  Service: Cardiovascular;  Laterality: N/A;  . RADIAL ARTERY HARVEST Left 01/27/2020   Procedure: RADIAL ARTERY HARVEST (OPEN HARVEST);  Surgeon: [name], MD;  Location: [facility];  Service: Open Heart Surgery;  Laterality: Left;  . RADIOLOGY WITH ANESTHESIA N/A 01/30/2020   Procedure: IR WITH ANESTHESIA;  Surgeon: Radiologist, Medication, MD;  Location: [facility];  Service: Radiology;  Laterality: N/A;  . TEE WITHOUT CARDIOVERSION N/A 01/27/2020   Procedure: TRANSESOPHAGEAL ECHOCARDIOGRAM (TEE);  Surgeon: [name], MD;  Location: [facility];  Service: Open Heart Surgery;  Laterality: N/A;  . THYROIDECTOMY    . TOTAL HIP ARTHROPLASTY       OB History   No obstetric history on file.     Family History  Problem Relation Age of Onset  . Breast cancer Mother 73  . Hypertension Mother   . Diabetes Mother   . Heart disease Mother   . Cirrhosis Father   . Cancer Brother   . Heart disease Brother   . Heart attack Sister     Social History   Tobacco Use  . Smoking status: Never Smoker  . Smokeless tobacco: Never Used  Substance Use Topics  . Alcohol use: Not Currently  . Drug use: Never    Home Medications Prior to Admission medications    Medication Sig Start Date End Date Taking? Authorizing Provider  acetaminophen (TYLENOL) 500 MG tablet Take 500 mg by mouth every 6 (six) hours as needed for mild pain.   Yes [provider]  aspirin EC 81 MG tablet Take 81 mg by mouth at bedtime.   Yes [provider]  atorvastatin (LIPITOR) 80 MG tablet Take 80 mg by mouth daily at 12 noon.    Yes [provider]  cholecalciferol (VITAMIN D) 1000 UNITS tablet Take 1,000 Units by mouth daily at 12 noon.    Yes [provider]  fluticasone (FLONASE) 50 MCG/ACT nasal spray Place 2 sprays into both nostrils daily. 12/19/19  Yes [provider]  isosorbide mononitrate (IMDUR) 30 MG 24 hr tablet Take 0.5 tablets (15 mg total) by mouth daily. 02/20/20  Yes Barrett, Erin R, PA-C  levothyroxine (SYNTHROID, LEVOTHROID) 200 MCG tablet Take 100-200 mcg by mouth See admin instructions. Take 200 mcg  SUN-TUES-THUR-SAT Take 100 mcg tab MON-WED-FRI  DO NOT USE GENERIC   Yes [provider]  lisinopril (ZESTRIL) 20 MG tablet Take 20 mg by mouth daily.    Yes [provider]  metoprolol tartrate (LOPRESSOR) 25 MG tablet Take 1 tablet (25 mg total) by mouth 2 (two) times daily. Patient taking differently: Take 25 mg by mouth 2 (two) times daily after a meal.  01/04/20 04/15/20 Yes [name], [name], MD  nitroGLYCERIN (NITROSTAT) 0.4 MG SL tablet Place 0.4 mg under the tongue every 5 (five) minutes as needed for chest pain.  11/22/19  Yes [provider]  Prenatal Vit-Fe Fumarate-FA (PRENATAL MULTIVITAMIN) TABS tablet Take 1 tablet by mouth daily at 12 noon.   Yes [provider]  rivaroxaban (XARELTO) 20 MG TABS tablet Take 1 tablet (20 mg total) by mouth daily with supper. 04/05/20  Yes Almyra Deforest, PA  sertraline (ZOLOFT) 50 MG tablet Take 1 tablet (50 mg total) by mouth at bedtime. Patient taking differently: Take 25 mg by mouth at bedtime.  02/20/20  Yes Barrett, Erin R, PA-C  vitamin  B-12 (CYANOCOBALAMIN) 1000 MCG tablet Take 2,500 mcg by mouth daily.   Yes [provider]    Allergies    Bee venom, Penicillins, Adhesive [tape], Morphine and related, and Sulfa antibiotics  Review of Systems   Review of Systems  Constitutional: Negative for chills and fever.  HENT: Negative for sore throat.   Eyes: Negative for redness.  Respiratory: Negative for cough and shortness of breath.   Cardiovascular: Positive for chest pain. Negative for palpitations and leg swelling.  Gastrointestinal: Negative for abdominal pain, diarrhea and vomiting.  Genitourinary: Negative for dysuria and flank pain.  Musculoskeletal: Negative for back pain and neck  pain.  Skin: Negative for rash.  Neurological: Negative for weakness, numbness and headaches.  Hematological: Does not bruise/bleed easily.  Psychiatric/Behavioral: Negative for confusion.    Physical Exam Updated Vital Signs BP 112/83   Pulse 77   Temp 97.8 F (36.6 C) (Oral)   Resp (!) 23   Ht 1.6 m (5\' 3" )   Wt 64.9 kg   SpO2 94%   BMI 25.35 kg/m   Physical Exam Vitals and nursing note reviewed.  Constitutional:      Appearance: Normal appearance. She is well-developed.  HENT:     Head: Atraumatic.     Nose: Nose normal.     Mouth/Throat:     Mouth: Mucous membranes are moist.  Eyes:     General: No scleral icterus.    Conjunctiva/sclera: Conjunctivae normal.     Pupils: Pupils are equal, round, and reactive to light.  Neck:     Trachea: No tracheal deviation.  Cardiovascular:     Rate and Rhythm: Normal rate and regular rhythm.     Pulses: Normal pulses.     Heart sounds: Normal heart sounds. No murmur. No friction rub. No gallop.   Pulmonary:     Effort: Pulmonary effort is normal. No respiratory distress.     Breath sounds: Normal breath sounds.  Chest:     Chest wall: Tenderness present.  Abdominal:     General: Bowel sounds are normal. There is no distension.     Palpations: Abdomen is soft.  There is no mass.     Tenderness: There is no abdominal tenderness. There is no guarding or rebound.     Hernia: No hernia is present.  Genitourinary:    Comments: No cva tenderness.  Musculoskeletal:        General: No swelling or tenderness.     Cervical back: Normal range of motion and neck supple. No rigidity. No muscular tenderness.     Right lower leg: No edema.     Left lower leg: No edema.  Skin:    General: Skin is warm and dry.     Findings: No rash.  Neurological:     Mental Status: She is alert.     Comments: Alert, speech fluent. Motor intact bil, stre 5/5. sens grossly intact.   Psychiatric:        Mood and Affect: Mood normal.     ED Results / Procedures / Treatments   Labs (all labs ordered are listed, but only abnormal results are displayed) Results for orders placed or performed during the hospital encounter of 0000000  Basic metabolic panel  Result Value Ref Range   Sodium 139 135 - 145 mmol/L   Potassium 4.5 3.5 - 5.1 mmol/L   Chloride 104 98 - 111 mmol/L   CO2 21 (L) 22 - 32 mmol/L   Glucose, Bld 103 (H) 70 - 99 mg/dL   BUN 18 8 - 23 mg/dL   Creatinine, Ser 0.96 0.44 - 1.00 mg/dL   Calcium 11.2 (H) 8.9 - 10.3 mg/dL   GFR calc non Af Amer 57 (L) >60 mL/min   GFR calc Af Amer >60 >60 mL/min   Anion gap 14 5 - 15  CBC  Result Value Ref Range   WBC 15.6 (H) 4.0 - 10.5 K/uL   RBC 4.73 3.87 - 5.11 MIL/uL   Hemoglobin 12.6 12.0 - 15.0 g/dL   HCT 40.3 36.0 - 46.0 %   MCV 85.2 80.0 - 100.0 fL   MCH 26.6  26.0 - 34.0 pg   MCHC 31.3 30.0 - 36.0 g/dL   RDW 15.6 (H) 11.5 - 15.5 %   Platelets 343 150 - 400 K/uL   nRBC 0.0 0.0 - 0.2 %  Urinalysis, Routine w reflex microscopic  Result Value Ref Range   Color, Urine YELLOW YELLOW   APPearance HAZY (A) CLEAR   Specific Gravity, Urine 1.017 1.005 - 1.030   pH 5.0 5.0 - 8.0   Glucose, UA NEGATIVE NEGATIVE mg/dL   Hgb urine dipstick SMALL (A) NEGATIVE   Bilirubin Urine NEGATIVE NEGATIVE   Ketones, ur 5 (A)  NEGATIVE mg/dL   Protein, ur 100 (A) NEGATIVE mg/dL   Nitrite NEGATIVE NEGATIVE   Leukocytes,Ua MODERATE (A) NEGATIVE   RBC / HPF 0-5 0 - 5 RBC/hpf   WBC, UA >50 (H) 0 - 5 WBC/hpf   Bacteria, UA MANY (A) NONE SEEN   Squamous Epithelial / LPF 0-5 0 - 5   Hyaline Casts, UA PRESENT   Troponin I (High Sensitivity)  Result Value Ref Range   Troponin I (High Sensitivity) 11 <18 ng/L  Troponin I (High Sensitivity)  Result Value Ref Range   Troponin I (High Sensitivity) 10 <18 ng/L   DG Chest 2 View  Result Date: 04/15/2020 CLINICAL DATA:  Chest pain EXAM: CHEST - 2 VIEW COMPARISON:  March 05, 2020 FINDINGS: The heart size and mediastinal contours are within normal limits. Both lungs are clear. Surgical clips seen at the over the upper mediastinum. No acute osseous abnormality. 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 IMPRESSION: No active cardiopulmonary disease. Electronically Signed   By: Prudencio Pair M.D.   On: 04/15/2020 02:09    EKG EKG Interpretation  Date/Time:  Saturday Apr 14 2020 18:44:47 EDT Ventricular Rate:  81 PR Interval:  146 QRS Duration: 80 QT Interval:  376 QTC Calculation: 436 R Axis:   57 Text Interpretation: Normal sinus rhythm Nonspecific T wave abnormality Confirmed by Lajean Saver [phone]) on 04/15/2020 7:25:38 AM   Radiology DG Chest 2 View  Result Date: 04/15/2020 CLINICAL DATA:  Chest pain EXAM: CHEST - 2 VIEW COMPARISON:  March 05, 2020 FINDINGS: The heart size and mediastinal contours are within normal limits. Both lungs are clear. Surgical clips seen at the over the upper mediastinum. No acute osseous abnormality. 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 IMPRESSION: No active cardiopulmonary disease. Electronically Signed   By: Prudencio Pair M.D.   On: 04/15/2020 02:09    Procedures Procedures (including critical care time)  Medications Ordered in ED Medications  sodium chloride flush (NS) 0.9 % injection 3 mL (3 mLs Intravenous  Not Given 04/15/20 0758)  acetaminophen (TYLENOL) tablet 650 mg (650 mg Oral Given 04/15/20 0757)    ED Course  I have reviewed the triage vital signs and the nursing notes.  Pertinent labs & imaging results that were available during my care of the patient were reviewed by me and considered in my medical decision making (see chart for details).    MDM Rules/Calculators/A&P                      Iv ns. Stat labs. Ecg. Cxr.   Reviewed nursing notes and prior charts for additional history.   Patient asking for food/drink - provided.   TOC team consulted re eval patient, assess home situation and possibility home health services/support.   Labs reviewed/interpreted by me - after constant chest pain for 2 days, trop x 2 normal - felt  not c/w ACS. +chest wall tenderness on exam. Ca mildly high. Ivf.   Acetaminophen po.   CXR reviewed/interpreted by me - no pna.   Additional labs reviewed/interpreted by me - ua c/w uti. Urine culture sent. Rocephin iv.   Offered admission or ECF care - pt refuses, states she is feeling fine, does not want to go to an ECF/SNF, and does not want to be admitted.  On recheck, pt indicates feels fine, no new c/o, tolerating po, ivf given, vitals normal.   TOC team has facilitated home health services, as patient insists she not be admitted and/or placed into ECF.  Patient currently appears stable for d/c (per her request).         Final Clinical Impression(s) / ED Diagnoses Final diagnoses:  None    Rx / DC Orders ED Discharge Orders    None           Lajean Saver, MD 04/15/20 1247

## 2020-04-15 NOTE — ED Notes (Signed)
Pt given breakfast tray

## 2020-04-15 NOTE — Care Management (Signed)
Asked to see [name] [name], who had a CABG a few months ago  complicated by post op stroke. Initially went to SNF, then discharged back to home with home health [facility]. Marland Kitchen She states now she is just getting speech therapy. She lives by herself. Has friends and a "niece" who brings her food and checks up on her. She alert  today, however, cannot get times right, and in her telling of how she came to be in the ED, there is not a storyline to follow, she is scattered and fragmented and transposing things such as bed for floor, and feet for inches.  Will move forward with obtaining PT consult to evaluate for resumption of HH versus SNF.  She may not want to go back to SNF, however there are concerns that she could have been on the floor for hours , so we need to ensure a safer plan moving forward.. PT was paged and they stated they will see her today.

## 2020-04-15 NOTE — ED Notes (Signed)
Patient verbalizes understanding of discharge instructions. Opportunity for questioning and answers were provided. Armband removed by staff, pt discharged from ED ambulatory.   

## 2020-04-15 NOTE — Discharge Instructions (Addendum)
It was our pleasure to provide your ER care today - we hope that you feel better.  Your lab tests show a urine infection - take antibiotic (keflex) as prescribed.   Drink plenty of fluids.  Take acetaminophen of ibuprofen as need.   Follow up with your doctor in the coming week.  Return to ER if worse, new symptoms, high fevers, new or severe pain, vomiting, chest pain, trouble breathing, or other concern.

## 2020-04-17 ENCOUNTER — Ambulatory Visit (INDEPENDENT_AMBULATORY_CARE_PROVIDER_SITE_OTHER): Payer: Self-pay | Admitting: Physician Assistant

## 2020-04-17 ENCOUNTER — Other Ambulatory Visit: Payer: Self-pay

## 2020-04-17 ENCOUNTER — Other Ambulatory Visit: Payer: Self-pay | Admitting: Cardiothoracic Surgery

## 2020-04-17 ENCOUNTER — Ambulatory Visit
Admission: RE | Admit: 2020-04-17 | Discharge: 2020-04-17 | Disposition: A | Discharge status: Home / Self Care | Payer: PPO | Source: Ambulatory Visit | Attending: Cardiothoracic Surgery | Admitting: Cardiothoracic Surgery

## 2020-04-17 VITALS — BP 127/71 | HR 61 | Temp 97.9°F | Resp 20 | Ht 63.0 in | Wt 134.0 lb

## 2020-04-17 DIAGNOSIS — I25118 Atherosclerotic heart disease of native coronary artery with other forms of angina pectoris: Secondary | ICD-10-CM

## 2020-04-17 DIAGNOSIS — J9811 Atelectasis: Secondary | ICD-10-CM | POA: Diagnosis not present

## 2020-04-17 DIAGNOSIS — H6123 Impacted cerumen, bilateral: Secondary | ICD-10-CM | POA: Diagnosis not present

## 2020-04-17 DIAGNOSIS — H903 Sensorineural hearing loss, bilateral: Secondary | ICD-10-CM | POA: Diagnosis not present

## 2020-04-17 DIAGNOSIS — H9313 Tinnitus, bilateral: Secondary | ICD-10-CM | POA: Diagnosis not present

## 2020-04-17 DIAGNOSIS — Z951 Presence of aortocoronary bypass graft: Secondary | ICD-10-CM

## 2020-04-17 DIAGNOSIS — T8132XA Disruption of internal operation (surgical) wound, not elsewhere classified, initial encounter: Secondary | ICD-10-CM

## 2020-04-17 LAB — URINE CULTURE: Culture: >=100000 — AB

## 2020-04-17 NOTE — Patient Instructions (Signed)
CT chest today without contrast.  Dr. Orvan Seen will review and advise further management.  Continue Keflex as prescribed.  Change sternal dressing daily and as needed.

## 2020-04-17 NOTE — Progress Notes (Signed)
HPI: [name] [name] is a 79 year old female who had coronary bypass grafting x3 on 01/27/2020 by Dr. Orvan Seen.  She had a stroke a couple of days following surgery and required extensive additional care in the ICU and was eventually discharged to inpatient rehab.  She is made good progress both with the CABG procedure and with stroke recovery.  She was last seen in our office on 03/05/2020 and was making satisfactory progress at that time.  She did not have any evidence of wound complications at that point. She presented to the office today with a primary complaint of spontaneous drainage from the sternal incision that a.m. this morning 5.  She said she woke up to go to the restroom and realized her clothing was saturated.  She also relates that she has had increased pain along both sides of her sternum over the last 5 to 6 days.  She denies having any fever or any sense of motion in her sternotomy incision or chest wall.  She was seen in emergency room 2 days ago for evaluation of this chest pain and had a cardiac work-up which was negative.  Chest x-ray was also unremarkable.  She was noted to have a white blood count of 15,000 and had a Klebsiella UTI and was started on Keflex which she is taking now.    Current Outpatient Medications  Medication Sig Dispense Refill  . acetaminophen (TYLENOL) 500 MG tablet Take 500 mg by mouth every 6 (six) hours as needed for mild pain.    Marland Kitchen aspirin EC 81 MG tablet Take 81 mg by mouth at bedtime.    Marland Kitchen atorvastatin (LIPITOR) 80 MG tablet Take 80 mg by mouth daily at 12 noon.     . cephALEXin (KEFLEX) 500 MG capsule Take 1 capsule (500 mg total) by mouth 3 (three) times daily. 15 capsule 0  . cholecalciferol (VITAMIN D) 1000 UNITS tablet Take 1,000 Units by mouth daily at 12 noon.     . fluticasone (FLONASE) 50 MCG/ACT nasal spray Place 2 sprays into both nostrils daily.    . isosorbide mononitrate (IMDUR) 30 MG 24 hr tablet Take 0.5 tablets (15 mg total) by mouth daily.     Marland Kitchen levothyroxine (SYNTHROID, LEVOTHROID) 200 MCG tablet Take 100-200 mcg by mouth See admin instructions. Take 200 mcg  SUN-TUES-THUR-SAT Take 100 mcg tab MON-WED-FRI  DO NOT USE GENERIC    . lisinopril (ZESTRIL) 20 MG tablet Take 20 mg by mouth daily.     . nitroGLYCERIN (NITROSTAT) 0.4 MG SL tablet Place 0.4 mg under the tongue every 5 (five) minutes as needed for chest pain.     . Prenatal Vit-Fe Fumarate-FA (PRENATAL MULTIVITAMIN) TABS tablet Take 1 tablet by mouth daily at 12 noon.    . rivaroxaban (XARELTO) 20 MG TABS tablet Take 1 tablet (20 mg total) by mouth daily with supper. 30 tablet 11  . sertraline (ZOLOFT) 50 MG tablet Take 1 tablet (50 mg total) by mouth at bedtime. (Patient taking differently: Take 25 mg by mouth at bedtime. )    . vitamin B-12 (CYANOCOBALAMIN) 1000 MCG tablet Take 2,500 mcg by mouth daily.    . metoprolol tartrate (LOPRESSOR) 25 MG tablet Take 1 tablet (25 mg total) by mouth 2 (two) times daily. (Patient taking differently: Take 25 mg by mouth 2 (two) times daily after a meal. ) 180 tablet 1   No current facility-administered medications for this visit.    Physical Exam: Vital signs BP   124/71 Pulse  61 Respirations  20  Temperature  97.9 F O2 saturation  97%  General: Ms. Nuanez is alert and oriented.  She is currently in no distress.  She drove herself to the office today.  Her gait is slow but she is steady on her feet.  She continues to have some difficulty with word finding. Heart: Regular rate and rhythm Chest: Breath sounds are clear to auscultation.  There is bilateral parasternal tenderness on the bottom half of the breastbone.  A dressing over the mid part of the incision was removed and has some yellowish drainage that has stained the gauze but has not soaked through.  There is segment measuring 3 to 4 cm in the mid part of the incision with hypertrophic granulation tissue.  There is no obvious opening and there is currently no drainage  expressed from this.  I had her cough a few times and I did not detect any instability in the sternum.  There is an obvious cavity measuring 4 to 5 cm long beneath the mid part of the incision line.   Diagnostic Tests:  EXAM: CHEST - 2 VIEW  COMPARISON:  March 05, 2020  FINDINGS: The heart size and mediastinal contours are within normal limits. Both lungs are clear. Surgical clips seen at the over the upper mediastinum. No acute osseous abnormality. 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  IMPRESSION: No active cardiopulmonary disease.   Electronically Signed   By: Prudencio Pair M.D.   On: 04/15/2020 02:09  Impression / Plan:  79 year old female about 2-1/2 months post coronary bypass grafting complicated by postoperative CVA appears to have developed a superficial sternal wound abscess that spontaneously drained earlier today.  There is currently no drainage but there is an obvious subcutaneous pocket that can be palpated on exam.  This will likely recur without intervention.  She is not in pain and is afebrile.  She is currently on Keflex for treatment of the UTI identified 2 days ago in the emergency room.  I think it is safe to evaluate this as an outpatient.  She will have a CT scan without contrast of the chest today to assure there is no substernal component to this process.  We will likely proceed with elective debridement in the OR and subsequent placement of wound VAC.  This is all explained to Ms. Blevins and she agrees with this plan of management.  Ms. Darpino office visit and these findings were also discussed with Dr. Heywood Footman, PA-C [facility] [phone]

## 2020-04-18 ENCOUNTER — Telehealth: Payer: Self-pay

## 2020-04-18 NOTE — Telephone Encounter (Signed)
Post ED Visit - Positive Culture Follow-up  Culture report reviewed by antimicrobial stewardship pharmacist: [facility] Team []  Elenor Quinones, Pharm.D. []  Heide Guile, Pharm.D., BCPS AQ-ID []  Parks Neptune, Pharm.D., BCPS []  Alycia Rossetti, Pharm.D., BCPS []  [place], Pharm.D., BCPS, AAHIVP []  Legrand Como, Pharm.D., BCPS, AAHIVP []  Salome Arnt, PharmD, BCPS []  Johnnette Gourd, PharmD, BCPS []  Hughes Better, PharmD, BCPS []  Leeroy Cha, PharmD []  Laqueta Linden, PharmD, BCPS []  Albertina Parr, PharmD [facility] [place] Team []  Leodis Sias, PharmD []  Lindell Spar, PharmD []  Royetta Asal, PharmD []  Graylin Shiver, Rph []  Rema Fendt) Glennon Mac, PharmD []  Arlyn Dunning, PharmD []  Netta Cedars, PharmD []  Dia Sitter, PharmD []  Leone Haven, PharmD []  Gretta Arab, PharmD []  Theodis Shove, PharmD []  Peggyann Juba, PharmD []  Reuel Boom, PharmD   Positive urine culture Treated with Cephalexin, organism sensitive to the same and no further patient follow-up is required at this time.  Genia Del 04/18/2020, 10:56 AM

## 2020-04-19 ENCOUNTER — Other Ambulatory Visit: Payer: Self-pay

## 2020-04-19 ENCOUNTER — Other Ambulatory Visit: Payer: Self-pay | Admitting: *Deleted

## 2020-04-19 ENCOUNTER — Ambulatory Visit (INDEPENDENT_AMBULATORY_CARE_PROVIDER_SITE_OTHER): Payer: Self-pay | Admitting: Cardiothoracic Surgery

## 2020-04-19 VITALS — BP 153/71 | HR 60 | Temp 97.9°F | Resp 20 | Ht 63.0 in | Wt 134.0 lb

## 2020-04-19 DIAGNOSIS — Z951 Presence of aortocoronary bypass graft: Secondary | ICD-10-CM

## 2020-04-19 DIAGNOSIS — L089 Local infection of the skin and subcutaneous tissue, unspecified: Secondary | ICD-10-CM

## 2020-04-19 DIAGNOSIS — T8132XD Disruption of internal operation (surgical) wound, not elsewhere classified, subsequent encounter: Secondary | ICD-10-CM

## 2020-04-20 ENCOUNTER — Encounter (HOSPITAL_COMMUNITY): Payer: Self-pay | Admitting: Cardiothoracic Surgery

## 2020-04-20 ENCOUNTER — Observation Stay (HOSPITAL_COMMUNITY)
Admission: RE | Admit: 2020-04-20 | Discharge: 2020-04-21 | Disposition: A | Discharge status: Home / Self Care | Payer: PPO | Attending: Cardiothoracic Surgery | Admitting: Cardiothoracic Surgery

## 2020-04-20 ENCOUNTER — Encounter (HOSPITAL_COMMUNITY)
Admission: RE | Disposition: A | Discharge status: Home / Self Care | Payer: Self-pay | Source: Home / Self Care | Attending: Cardiothoracic Surgery

## 2020-04-20 ENCOUNTER — Ambulatory Visit (HOSPITAL_COMMUNITY): Payer: PPO | Admitting: Anesthesiology

## 2020-04-20 DIAGNOSIS — T8149XA Infection following a procedure, other surgical site, initial encounter: Principal | ICD-10-CM | POA: Insufficient documentation

## 2020-04-20 DIAGNOSIS — Z951 Presence of aortocoronary bypass graft: Secondary | ICD-10-CM | POA: Insufficient documentation

## 2020-04-20 DIAGNOSIS — D62 Acute posthemorrhagic anemia: Secondary | ICD-10-CM | POA: Diagnosis not present

## 2020-04-20 DIAGNOSIS — Z20822 Contact with and (suspected) exposure to covid-19: Secondary | ICD-10-CM | POA: Insufficient documentation

## 2020-04-20 DIAGNOSIS — Z882 Allergy status to sulfonamides status: Secondary | ICD-10-CM | POA: Insufficient documentation

## 2020-04-20 DIAGNOSIS — E039 Hypothyroidism, unspecified: Secondary | ICD-10-CM | POA: Insufficient documentation

## 2020-04-20 DIAGNOSIS — Z880 Allergy status to penicillin: Secondary | ICD-10-CM | POA: Insufficient documentation

## 2020-04-20 DIAGNOSIS — Z885 Allergy status to narcotic agent status: Secondary | ICD-10-CM | POA: Diagnosis not present

## 2020-04-20 DIAGNOSIS — I10 Essential (primary) hypertension: Secondary | ICD-10-CM | POA: Diagnosis not present

## 2020-04-20 DIAGNOSIS — T8140XA Infection following a procedure, unspecified, initial encounter: Secondary | ICD-10-CM | POA: Diagnosis not present

## 2020-04-20 DIAGNOSIS — L089 Local infection of the skin and subcutaneous tissue, unspecified: Secondary | ICD-10-CM | POA: Diagnosis present

## 2020-04-20 DIAGNOSIS — X58XXXA Exposure to other specified factors, initial encounter: Secondary | ICD-10-CM | POA: Insufficient documentation

## 2020-04-20 DIAGNOSIS — I25118 Atherosclerotic heart disease of native coronary artery with other forms of angina pectoris: Secondary | ICD-10-CM | POA: Diagnosis not present

## 2020-04-20 DIAGNOSIS — Z79899 Other long term (current) drug therapy: Secondary | ICD-10-CM | POA: Diagnosis not present

## 2020-04-20 DIAGNOSIS — T8131XD Disruption of external operation (surgical) wound, not elsewhere classified, subsequent encounter: Secondary | ICD-10-CM | POA: Diagnosis not present

## 2020-04-20 HISTORY — PX: APPLICATION OF WOUND VAC: SHX5189

## 2020-04-20 HISTORY — PX: STERNAL WOUND DEBRIDEMENT: SHX1058

## 2020-04-20 LAB — PROTIME-INR
INR: 1.1 (ref 0.8–1.2)
Prothrombin Time: 14.2 seconds (ref 11.4–15.2)

## 2020-04-20 LAB — SARS CORONAVIRUS 2 BY RT PCR (HOSPITAL ORDER, PERFORMED IN ~~LOC~~ HOSPITAL LAB): SARS Coronavirus 2: NEGATIVE

## 2020-04-20 SURGERY — DEBRIDEMENT, WOUND, STERNUM
Anesthesia: Monitor Anesthesia Care

## 2020-04-20 MED ORDER — LACTATED RINGERS IV SOLN: INTRAVENOUS | Status: DC

## 2020-04-20 MED ORDER — FENTANYL CITRATE (PF) 250 MCG/5ML IJ SOLN
INTRAMUSCULAR | Status: AC
  Filled 2020-04-20: qty 5

## 2020-04-20 MED ORDER — PROPOFOL 500 MG/50ML IV EMUL
INTRAVENOUS | Status: DC | PRN
  Administered 2020-04-20: 100 ug/kg/min via INTRAVENOUS

## 2020-04-20 MED ORDER — VITAMIN D 25 MCG (1000 UNIT) PO TABS
1000.0000 [IU] | ORAL_TABLET | Freq: Every day | ORAL | Status: DC
  Administered 2020-04-21: 1000 [IU] via ORAL
  Filled 2020-04-20: qty 1

## 2020-04-20 MED ORDER — SERTRALINE HCL 50 MG PO TABS
50.0000 mg | ORAL_TABLET | Freq: Every day | ORAL | Status: DC
  Administered 2020-04-20: 50 mg via ORAL
  Filled 2020-04-20: qty 1

## 2020-04-20 MED ORDER — ASPIRIN EC 81 MG PO TBEC
81.0000 mg | DELAYED_RELEASE_TABLET | Freq: Every day | ORAL | Status: DC
  Administered 2020-04-20: 81 mg via ORAL
  Filled 2020-04-20: qty 1

## 2020-04-20 MED ORDER — LEVOTHYROXINE SODIUM 100 MCG PO TABS
200.0000 ug | ORAL_TABLET | ORAL | Status: DC
  Administered 2020-04-21: 200 ug via ORAL
  Filled 2020-04-20: qty 2

## 2020-04-20 MED ORDER — CEPHALEXIN 500 MG PO CAPS
500.0000 mg | ORAL_CAPSULE | Freq: Three times a day (TID) | ORAL | Status: DC
  Administered 2020-04-20 (×2): 500 mg via ORAL
  Filled 2020-04-20 (×3): qty 1

## 2020-04-20 MED ORDER — ATORVASTATIN CALCIUM 80 MG PO TABS
80.0000 mg | ORAL_TABLET | Freq: Every day | ORAL | Status: DC
  Administered 2020-04-21: 80 mg via ORAL
  Filled 2020-04-20: qty 1

## 2020-04-20 MED ORDER — PRENATAL MULTIVITAMIN CH
1.0000 | ORAL_TABLET | Freq: Every day | ORAL | Status: DC
  Filled 2020-04-20: qty 1

## 2020-04-20 MED ORDER — RIVAROXABAN 20 MG PO TABS
20.0000 mg | ORAL_TABLET | Freq: Every day | ORAL | Status: DC
  Administered 2020-04-21: 20 mg via ORAL
  Filled 2020-04-20: qty 1

## 2020-04-20 MED ORDER — VANCOMYCIN HCL IN DEXTROSE 1-5 GM/200ML-% IV SOLN
INTRAVENOUS | Status: AC
  Administered 2020-04-20: 1000 mg via INTRAVENOUS
  Filled 2020-04-20: qty 200

## 2020-04-20 MED ORDER — FLUTICASONE PROPIONATE 50 MCG/ACT NA SUSP
2.0000 | Freq: Every day | NASAL | Status: DC
  Administered 2020-04-21: 2 via NASAL
  Filled 2020-04-20: qty 16

## 2020-04-20 MED ORDER — METOPROLOL TARTRATE 25 MG PO TABS
25.0000 mg | ORAL_TABLET | Freq: Two times a day (BID) | ORAL | Status: DC
  Administered 2020-04-21 (×2): 25 mg via ORAL
  Filled 2020-04-20 (×4): qty 1

## 2020-04-20 MED ORDER — LISINOPRIL 20 MG PO TABS
20.0000 mg | ORAL_TABLET | Freq: Every day | ORAL | Status: DC
  Administered 2020-04-21: 20 mg via ORAL
  Filled 2020-04-20: qty 1

## 2020-04-20 MED ORDER — ISOSORBIDE MONONITRATE ER 30 MG PO TB24
15.0000 mg | ORAL_TABLET | Freq: Every day | ORAL | Status: DC
  Administered 2020-04-21: 15 mg via ORAL
  Filled 2020-04-20: qty 1

## 2020-04-20 MED ORDER — FENTANYL CITRATE (PF) 250 MCG/5ML IJ SOLN
INTRAMUSCULAR | Status: DC | PRN
  Administered 2020-04-20: 50 ug via INTRAVENOUS
  Administered 2020-04-20: 25 ug via INTRAVENOUS

## 2020-04-20 MED ORDER — PROPOFOL 10 MG/ML IV BOLUS
INTRAVENOUS | Status: DC | PRN
  Administered 2020-04-20: 20 mg via INTRAVENOUS

## 2020-04-20 MED ORDER — ONDANSETRON HCL 4 MG/2ML IJ SOLN
INTRAMUSCULAR | Status: DC | PRN
  Administered 2020-04-20: 4 mg via INTRAVENOUS

## 2020-04-20 MED ORDER — FENTANYL CITRATE (PF) 100 MCG/2ML IJ SOLN: 25.0000 ug | INTRAMUSCULAR | Status: DC | PRN

## 2020-04-20 MED ORDER — CHLORHEXIDINE GLUCONATE 0.12 % MT SOLN
OROMUCOSAL | Status: AC
  Administered 2020-04-20: 15 mL via OROMUCOSAL
  Filled 2020-04-20: qty 15

## 2020-04-20 MED ORDER — 0.9 % SODIUM CHLORIDE (POUR BTL) OPTIME
TOPICAL | Status: DC | PRN
  Administered 2020-04-20: 2000 mL

## 2020-04-20 MED ORDER — PROMETHAZINE HCL 25 MG/ML IJ SOLN: 6.2500 mg | INTRAMUSCULAR | Status: DC | PRN

## 2020-04-20 MED ORDER — ACETAMINOPHEN 500 MG PO TABS
1000.0000 mg | ORAL_TABLET | Freq: Once | ORAL | Status: AC
  Administered 2020-04-20: 1000 mg via ORAL
  Filled 2020-04-20: qty 2

## 2020-04-20 MED ORDER — ACETAMINOPHEN 500 MG PO TABS
500.0000 mg | ORAL_TABLET | Freq: Four times a day (QID) | ORAL | Status: DC | PRN
  Administered 2020-04-21: 500 mg via ORAL
  Filled 2020-04-20: qty 1

## 2020-04-20 MED ORDER — CHLORHEXIDINE GLUCONATE 0.12 % MT SOLN: 15.0000 mL | Freq: Once | OROMUCOSAL | Status: AC

## 2020-04-20 MED ORDER — ORAL CARE MOUTH RINSE: 15.0000 mL | Freq: Once | OROMUCOSAL | Status: AC

## 2020-04-20 MED ORDER — PROPOFOL 10 MG/ML IV BOLUS
INTRAVENOUS | Status: AC
  Filled 2020-04-20: qty 20

## 2020-04-20 MED ORDER — VANCOMYCIN HCL IN DEXTROSE 1-5 GM/200ML-% IV SOLN: 1000.0000 mg | INTRAVENOUS | Status: AC

## 2020-04-20 MED ORDER — LEVOTHYROXINE SODIUM 100 MCG PO TABS: 100.0000 ug | ORAL_TABLET | ORAL | Status: DC

## 2020-04-20 MED ORDER — OXYCODONE HCL 5 MG PO TABS: 5.0000 mg | ORAL_TABLET | ORAL | Status: DC | PRN

## 2020-04-20 SURGICAL SUPPLY — 13 items
APL SKNCLS STERI-STRIP NONHPOA (GAUZE/BANDAGES/DRESSINGS) ×1
BENZOIN TINCTURE PRP APPL 2/3 (GAUZE/BANDAGES/DRESSINGS) ×1 IMPLANT
CANISTER WOUNDNEG PRESSURE 500 (CANNISTER) ×1 IMPLANT
DRAPE LAPAROSCOPIC ABDOMINAL (DRAPES) ×1 IMPLANT
DRSG VAC ATS SM SENSATRAC (GAUZE/BANDAGES/DRESSINGS) ×1 IMPLANT
ELECT REM PT RETURN 9FT ADLT (ELECTROSURGICAL) ×2
ELECTRODE REM PT RTRN 9FT ADLT (ELECTROSURGICAL) IMPLANT
KIT BASIN (CUSTOM PROCEDURE TRAY) ×1 IMPLANT
KIT TURNOVER KIT B (KITS) ×1 IMPLANT
NS IRRIG 1000ML POUR BTL (IV SOLUTION) ×1 IMPLANT
PACK CHEST (CUSTOM PROCEDURE TRAY) ×1 IMPLANT
TOWEL GREEN STERILE FF (TOWEL DISPOSABLE) ×1 IMPLANT
WATER STERILE IRR 1000ML POUR (IV SOLUTION) ×1 IMPLANT

## 2020-04-20 NOTE — Care Management (Signed)
Patient active with [facility] . Spoke to [place] with [facility] . They will see her Monday.   Spoke to [place] at [facility]. SHe has paperwork for [facility], however does not expect approval until tomorrow. Patient aware.  Magdalen Spatz RN

## 2020-04-20 NOTE — OR Nursing (Signed)
Sternal wound measured at 9 cm length, 3 cm width, and 2 cm depth.

## 2020-04-20 NOTE — Hospital Course (Addendum)
History of Present Illness:   Ms. Wrobleski is a 79 yo female with known history of CABG x 3 performed on 01/27/2020.  Her post operative course was complicated by a stroke.  She was discharged to inpatient rehab for additional recovery.  She was discharged home.  She has presented to the ED on several occasions with complaints of chest pain.  Cardiac workup was negative.  She was found to have a UTI and was started on ABX for that.  She contacted our office with complaints of sternal drainage.  She was evaluated and was found to have a suspected superficial sternal wound abscess.  There was no further drainage present, but there was an empty pocket present.  She underwent CT scan which showed an Ill-defined, relatively diffuse fluid and gas both deep and less so superficial to the median sternotomy site.  Due to this it was felt she should undergo surgical debridement with placement of a wound vac.  She was agreeable to proceed.   Hospital Course:  Ms. Orlowski presented to [facility] on 04/20/2020.  She was taken to the operating room and underwent sternal debridement with placement of wound vac.  The patient tolerated the procedure.  She was observed overnight.  Home wound vac care has been arranged.  Please note, patient was diagnosed with a UTI (Klebsiella Pneumonia) on 05/30 and has been taking Keflex. She states her last dose is today. She is medically stable for discharge home today.

## 2020-04-20 NOTE — Op Note (Signed)
Procedure(s): STERNAL WOUND DEBRIDEMENT APPLICATION OF WOUND VAC Procedure Note  [name] [name] female 79 y.o. 04/20/2020  Procedure(s) and Anesthesia Type:    * STERNAL WOUND DEBRIDEMENT - Monitor Anesthesia Care    * APPLICATION OF WOUND VAC - Monitor Anesthesia Care  Surgeon(s) and Role:    * [name], MD - Primary   Indications: The patient is status post CABG in March. She presents with superficial sternal defect. She is taken to the OR for sternal examination, debridement, and wound vac placement.     Surgeon: [name]   Assistants: staff  Anesthesia: Monitored Local Anesthesia with Sedation  ASA Class: 3    Procedure Detail  STERNAL WOUND DEBRIDEMENT, APPLICATION OF WOUND VAC After informed consent is obtained, she is taken to the OR for wound debridement. She is placed on the OR table and IV sedation is given for anesthesia. The chest is cleansed and draped sterilely. A preoperative pause is performed. The sternal defect is examined; there is a space superficial to the sternum and old suture material is present, which is debrided. The wound is irrigated. A small vac foam is cut to fit the defect and covered with VAC drape. All sponge instrument and needle counts are correct. The wound defect is 9 cm long x 3 cm wide x 2 cm deep. Estimated Blood Loss:  Minimal         Drains: no          Blood Given: none          Specimens: no         Implants: none        Complications:  * No complications entered in OR log *         Disposition: PACU - hemodynamically stable.         Condition: stable

## 2020-04-20 NOTE — Progress Notes (Signed)
      [address]       [place] [address]             [phone]     CARDIOTHORACIC SURGERY OFFICE NOTE  Referring Provider is [name], MD Primary Cardiologist is Evalina Field, MD PCP is [name], MD   HPI:  Pt is s/p CABG who was doing well until presenting this week with sternal pain. This lead to spontaneous drainage of murky fluid. She was seen earlier in the week, when CT scan was done, showing intact sternal repair. Now here for f/u. Denies f/c, n/v; is being treated for UTI diagnosed in ED this week.    No current facility-administered medications for this visit.   No current outpatient medications on file.   Facility-Administered Medications Ordered in Other Visits  Medication Dose Route Frequency Provider Last Rate Last Admin  . chlorhexidine (PERIDEX) 0.12 % solution 15 mL  15 mL Mouth/Throat Once Audry Pili, MD       Or  . MEDLINE mouth rinse  15 mL Mouth Rinse Once Audry Pili, MD      . chlorhexidine (PERIDEX) 0.12 % solution           . lactated ringers infusion   Intravenous Continuous Audry Pili, MD      . vancomycin [facility]) 1-5 GM/200ML-% IVPB           . [START ON 04/21/2020] vancomycin (VANCOCIN) IVPB 1000 mg/200 mL premix  1,000 mg Intravenous On Call to OR Toniette Devera, Glenice Bow, MD          Physical Exam:   BP (!) 153/71 (BP Location: Right Arm, Patient Position: Sitting, Cuff Size: Normal)   Pulse 60   Temp 97.9 F (36.6 C) (Temporal)   Resp 20   Ht 5\' 3"  (1.6 m)   Wt 60.8 kg   SpO2 95% Comment: RA  BMI 23.74 kg/m   General:  Well-appearing, NAD  Chest:   cta  CV:   rrr  Incisions:  Small defect at mid sternum, tracking superiorly and inferiorly  Extremities:  Mild edema  Diagnostic Tests:  No new information; CT scan from earlier this week reviewd   Impression:  S/p CABG doing well except for superficial sternal defect  Plan:  Suggest OR for wound debridement and wound vac placement  to treat clinical diagnosis of superficial sternal wound defect.  I spent in excess of 20 minutes during the conduct of this office consultation and >50% of this time involved direct face-to-face encounter with the patient for counseling and/or coordination of their care.  Level 2                 10 minutes Level 3                 15 minutes Level 4                 25 minutes Level 5                 40 minutes  B. Murvin Natal, MD 04/20/2020 1:03 PM

## 2020-04-20 NOTE — Anesthesia Postprocedure Evaluation (Signed)
Anesthesia Post Note  Patient: Billie-Lynn D Guandique  Procedure(s) Performed: STERNAL WOUND DEBRIDEMENT (N/A ) APPLICATION OF WOUND VAC (N/A )     Patient location during evaluation: PACU Anesthesia Type: MAC Level of consciousness: awake and alert Pain management: pain level controlled Vital Signs Assessment: post-procedure vital signs reviewed and stable Respiratory status: spontaneous breathing and respiratory function stable Cardiovascular status: stable Postop Assessment: no apparent nausea or vomiting Anesthetic complications: no    Last Vitals:  Vitals:   04/20/20 1530 04/20/20 1550  BP: (!) 148/80 (!) 155/74  Pulse: (!) 50 (!) 59  Resp: 13 16  Temp: 36.6 C 36.4 C  SpO2: 98% 100%    Last Pain:  Vitals:   04/20/20 1620  TempSrc:   PainSc: 0-No pain                 Kortney Potvin DANIEL

## 2020-04-20 NOTE — H&P (Signed)
History and Physical Interval Note:  04/20/2020 1:51 PM  [name] [name]  has presented today for surgery, with the diagnosis of sternal wound infection.  The various methods of treatment have been discussed with the patient and family. After consideration of risks, benefits and other options for treatment, the patient has consented to  Procedure(s): STERNAL WOUND DEBRIDEMENT (N/A) APPLICATION OF WOUND VAC (N/A) as a surgical intervention.  The patient's history has been reviewed, patient examined, no change in status, stable for surgery.  I have reviewed the patient's chart and labs.  Questions were answered to the patient's satisfaction.     [address]       [place] [address]             [phone]     CARDIOTHORACIC SURGERY OFFICE NOTE  Referring Provider is No ref. provider found Primary Cardiologist is Evalina Field, MD PCP is [name], MD   HPI:  Pt is s/p CABG who was doing well until presenting this week with sternal pain. This lead to spontaneous drainage of murky fluid. She was seen earlier in the week, when CT scan was done, showing intact sternal repair. Now here for f/u. Denies f/c, n/v; is being treated for UTI diagnosed in ED this week.    Current Facility-Administered Medications  Medication Dose Route Frequency Provider Last Rate Last Admin  . acetaminophen (TYLENOL) tablet 1,000 mg  1,000 mg Oral Once Duane Boston, MD      . lactated ringers infusion   Intravenous Continuous Audry Pili, MD 10 mL/hr at 04/20/20 1321 New Bag at 04/20/20 1321  . [START ON 04/21/2020] vancomycin (VANCOCIN) IVPB 1000 mg/200 mL premix  1,000 mg Intravenous On Call to OR [name], MD 200 mL/hr at 04/20/20 1322 1,000 mg at 04/20/20 1322      Physical Exam:   BP (!) 150/48   Pulse (!) 50   Temp (!) 97.5 F (36.4 C) (Oral)   Resp 18   Ht 5\' 3"  (1.6 m)   Wt 69.4 kg   SpO2 100%   BMI 27.10 kg/m    General:  Well-appearing, NAD  Chest:   cta  CV:   rrr  Incisions:  Small defect at mid sternum, tracking superiorly and inferiorly  Extremities:  Mild edema  Diagnostic Tests:  No new information; CT scan from earlier this week reviewd   Impression:  S/p CABG doing well except for superficial sternal defect  Plan:  Suggest OR for wound debridement and wound vac placement to treat clinical diagnosis of superficial sternal wound defect.  I spent in excess of 20 minutes during the conduct of this office consultation and >50% of this time involved direct face-to-face encounter with the patient for counseling and/or coordination of their care.  Level 2                 10 minutes Level 3                 15 minutes Level 4                 25 minutes Level 5                 40 minutes  B. Murvin Natal, MD 04/20/2020 1:51 PM

## 2020-04-20 NOTE — Anesthesia Preprocedure Evaluation (Signed)
Anesthesia Evaluation  Patient identified by MRN, date of birth, ID band Patient unresponsive    Reviewed: Allergy & Precautions, NPO status , Patient's Chart, lab work & pertinent test results, Unable to perform ROS - Chart review only  Airway Mallampati: II  TM Distance: >3 FB Neck ROM: Full    Dental  (+) Teeth Intact, Dental Advisory Given   Pulmonary neg pulmonary ROS,    Pulmonary exam normal breath sounds clear to auscultation       Cardiovascular hypertension, Pt. on home beta blockers + angina + CAD and + CABG  Normal cardiovascular exam Rhythm:Regular Rate:Normal  S/p CABG 01/27/2020 IMPRESSIONS     1. Left ventricular ejection fraction, by estimation, is 60 to 65%. The left ventricle has normal function. The left ventricle has no regional wall motion abnormalities. 2. Right ventricular systolic function is mildly reduced. The right ventricular size is mildly enlarged. 3. The mitral valve is abnormal. Trivial mitral valve regurgitation.   Comparison(s): 01/30/20: LVEF 30-35%.    Neuro/Psych CVA    GI/Hepatic negative GI ROS, Neg liver ROS,   Endo/Other  Hypothyroidism   Renal/GU negative Renal ROS     Musculoskeletal negative musculoskeletal ROS (+)   Abdominal   Peds  Hematology  (+) Blood dyscrasia, anemia ,   Anesthesia Other Findings Day of surgery medications reviewed with the patient.  Reproductive/Obstetrics                             Anesthesia Physical  Anesthesia Plan  ASA: III  Anesthesia Plan: MAC   Post-op Pain Management:    Induction:   PONV Risk Score and Plan: 2 and Ondansetron and Propofol infusion  Airway Management Planned: Natural Airway  Additional Equipment: Arterial line  Intra-op Plan:   Post-operative Plan: Possible Post-op intubation/ventilation  Informed Consent: I have reviewed the patients History and Physical, chart, labs and  discussed the procedure including the risks, benefits and alternatives for the proposed anesthesia with the patient or authorized representative who has indicated his/her understanding and acceptance.     Dental advisory given and History available from chart only  Plan Discussed with: CRNA, Anesthesiologist and Surgeon  Anesthesia Plan Comments:         Anesthesia Quick Evaluation

## 2020-04-20 NOTE — Care Management (Signed)
Asked by CV team to order wound vac.  Measurements to be in chart. Called tracy from [facility] and faxed information over. Donille from CV team signed paperwork. Hope to get wound vac for home today to avoid hospital overnight stay. She is already followed by [facility] home health . Will update them on er and resume home health orders

## 2020-04-20 NOTE — Transfer of Care (Signed)
Immediate Anesthesia Transfer of Care Note  Patient: [name] [name]  Procedure(s) Performed: STERNAL WOUND DEBRIDEMENT (N/A ) APPLICATION OF WOUND VAC (N/A )  Patient Location: PACU  Anesthesia Type:MAC  Level of Consciousness: awake, alert  and oriented  Airway & Oxygen Therapy: Patient Spontanous Breathing  Post-op Assessment: Report given to RN and Post -op Vital signs reviewed and stable  Post vital signs: Reviewed and stable  Last Vitals:  Vitals Value Taken Time  BP 123/55 04/20/20 1500  Temp 36.5 C 04/20/20 1500  Pulse 52 04/20/20 1503  Resp 12 04/20/20 1503  SpO2 100 % 04/20/20 1503  Vitals shown include unvalidated device data.  Last Pain:  Vitals:   04/20/20 1309  TempSrc:   PainSc: 2       Patients Stated Pain Goal: 1 (59/74/16 3845)  Complications: No apparent anesthesia complications

## 2020-04-21 DIAGNOSIS — T8149XA Infection following a procedure, other surgical site, initial encounter: Secondary | ICD-10-CM | POA: Diagnosis not present

## 2020-04-21 DIAGNOSIS — T8189XA Other complications of procedures, not elsewhere classified, initial encounter: Secondary | ICD-10-CM | POA: Diagnosis not present

## 2020-04-21 MED ORDER — TRAMADOL HCL 50 MG PO TABS: 50.0000 mg | ORAL_TABLET | Freq: Four times a day (QID) | ORAL | 0 refills | Status: DC | PRN

## 2020-04-21 NOTE — Progress Notes (Addendum)
      [address]       [place] [address]             [phone]        1 Day Post-Op Procedure(s) (LRB): STERNAL WOUND DEBRIDEMENT (N/A) APPLICATION OF WOUND VAC (N/A)  Subjective: Patient without complaints this am. She asked about her urinalysis results from 05/30. She then told me she just finished the prescription for her UTI.  Objective: Vital signs in last 24 hours: Temp:  [97.5 F (36.4 C)-98.7 F (37.1 C)] 98 F (36.7 C) (06/05 0605) Pulse Rate:  [50-59] 55 (06/05 0605) Cardiac Rhythm: Sinus bradycardia (06/04 1530) Resp:  [13-18] 16 (06/05 0605) BP: (123-155)/(48-80) 125/60 (06/05 0605) SpO2:  [96 %-100 %] 98 % (06/05 0605) Weight:  [69.4 kg] 69.4 kg (06/04 1257)   Current Weight  04/20/20 69.4 kg       Intake/Output from previous day: 06/04 0701 - 06/05 0700 In: 400 [I.V.:200; IV Piggyback:200] Out: 361 [Urine:350; Drains:10; Blood:1]   Physical Exam:  Cardiovascular: RRR Pulmonary: Clear to auscultation bilaterally Extremities: No lower extremity edema. Wounds: Wound VAC in place on sternum  Lab Results: CBC:No results for input(s): WBC, HGB, HCT, PLT in the last 72 hours. BMET: No results for input(s): NA, K, CL, CO2, GLUCOSE, BUN, CREATININE, CALCIUM in the last 72 hours.  PT/INR:  Lab Results  Component Value Date   INR 1.1 04/20/2020   INR 1.1 01/30/2020   INR 1.1 01/28/2020   ABG:  INR: Will add last result for INR, ABG once components are confirmed Will add last 4 CBG results once components are confirmed  Assessment/Plan:  1. CV - SR, SB at times. On Imdur 15 mg daily, Lopressor 25 mg bid, Lisinopril 20 mg daily, and Rivaroxaban 20 mg at night. 2.  Pulmonary - On room air. 3. Wound-Sternal wound VAC in place.  4. ID-on Keflex 500 mg tid for UTI. Per patient, last doses today 5. Hypothyroidism-on Levothyroxine 6. Awaiting approval from insurance;once obtained, discharge once obtained.  Donielle M  ZimmermanPA-C 04/21/2020,8:05 AM   patient examined and medical record reviewed,agree with above note. Tharon Aquas Trigt III 04/21/2020

## 2020-04-21 NOTE — Discharge Summary (Signed)
Physician Discharge Summary       [address]       [place] [address]             [phone]    Patient ID: [name] [name] MRN: [id number] DOB/AGE: [birth year] 79 y.o.  Admit date: 04/20/2020 Discharge date: 04/21/2020  Admission Diagnoses: Sternal wound infection  Discharge Diagnoses:  S/p sternal wound debridement and placement of wound VAC on 04/20/2020 by Dr. Orvan Seen.  Consults: None  Procedure (s):  Procedure(s) and Anesthesia Type:    * STERNAL WOUND DEBRIDEMENT - Monitor Anesthesia Care    * APPLICATION OF WOUND VAC - Monitor Anesthesia Care by Dr. Orvan Seen on 04/20/2020  History of Presenting Illness: [name] [name] is a 79 yo female with known history of CABG x 3 performed on 01/27/2020.  Her post operative course was complicated by a stroke.  She was discharged to inpatient rehab for additional recovery.  She was discharged home.  She has presented to the ED on several occasions with complaints of chest pain.  Cardiac workup was negative.  She was found to have a UTI and was started on ABX for that.  She contacted our office with complaints of sternal drainage.  She was evaluated and was found to have a suspected superficial sternal wound abscess.  There was no further drainage present, but there was an empty pocket present.  She underwent CT scan which showed an Ill-defined, relatively diffuse fluid and gas both deep and less so superficial to the median sternotomy site.  Due to this it was felt she should undergo surgical debridement with placement of a wound vac.  She was agreeable to proceed.  Brief Hospital Course:  [name] [name] presented to [facility] on 04/20/2020.  She was taken to the operating room and underwent sternal debridement with placement of wound vac.  The patient tolerated the procedure.  She was observed overnight.  Home wound vac care has been arranged.  Please note, patient was diagnosed with a UTI (Klebsiella Pneumonia) on 05/30 and has  been taking Keflex. She states her last dose is today. She is medically stable for discharge home today.   Latest Vital Signs: Blood pressure (!) 143/52, pulse 60, temperature 98.4 F (36.9 C), temperature source Oral, resp. rate 16, height 5\' 3"  (1.6 m), weight 69.4 kg, SpO2 98 %.  Physical Exam: Cardiovascular: RRR Pulmonary: Clear to auscultation bilaterally Extremities: No lower extremity edema. Wounds: Wound VAC in place on sternum  Discharge Condition: Stable and discharged to home with [facility].  Recent laboratory studies:  Lab Results  Component Value Date   WBC 15.6 (H) 04/15/2020   HGB 12.6 04/15/2020   HCT 40.3 04/15/2020   MCV 85.2 04/15/2020   PLT 343 04/15/2020   Lab Results  Component Value Date   NA 139 04/15/2020   K 4.5 04/15/2020   CL 104 04/15/2020   CO2 21 (L) 04/15/2020   CREATININE 0.96 04/15/2020   GLUCOSE 103 (H) 04/15/2020      Diagnostic Studies: DG Chest 2 View  Result Date: 04/15/2020 CLINICAL DATA:  Chest pain EXAM: CHEST - 2 VIEW COMPARISON:  March 05, 2020 FINDINGS: The heart size and mediastinal contours are within normal limits. Both lungs are clear. Surgical clips seen at the over the upper mediastinum. No acute osseous abnormality. 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 IMPRESSION: No active cardiopulmonary disease. Electronically Signed   By: Prudencio Pair M.D.   On: 04/15/2020 02:09  CT CHEST WO CONTRAST  Result Date: 04/17/2020 CLINICAL DATA:  Chest pain. CABG 01/27/2020. History of parasternal abscess. Sternal incision and drainage with pain along sternum. Thyroid cancer. Thyroidectomy. EXAM: CT CHEST WITHOUT CONTRAST TECHNIQUE: Multidetector CT imaging of the chest was performed following the standard protocol without IV contrast. COMPARISON:  04/15/2020 chest radiograph. No prior diagnostic CT of the chest. There is prior cardiac/coronary CT of 01/19/2020. FINDINGS: Cardiovascular: Aortic atherosclerosis. Tortuous thoracic  aorta. Moderate cardiomegaly. Native coronary artery atherosclerosis. Trace pericardial fluid. Pulmonary artery enlargement, outflow tract 3.6 cm. Mediastinum/Nodes: Thyroidectomy. No supraclavicular adenopathy. No mediastinal or definite hilar adenopathy, given limitations of unenhanced CT. Lungs/Pleura: No pleural fluid. Anterior bibasilar subsegmental atelectasis. Upper Abdomen: Normal imaged portions of the liver, spleen, stomach, pancreas, adrenal glands, kidneys. Musculoskeletal: Multiple foci of ill-defined fluid and gas both superficial and deep to the sternum. No well-defined dominant collection, given limitations of noncontrast CT. Percutaneous pacer wires incidentally noted. Degenerative changes of both glenohumeral joints. Nonunited median sternotomy. Thoracic spondylosis with accentuation of expected kyphosis. Convex left thoracic spine curvature. IMPRESSION: 1. Ill-defined, relatively diffuse fluid and gas both deep and less so superficial to the median sternotomy site. No well-defined dominant fluid collection. Decreased sensitivity for fluid collection secondary to noncontrast technique. 2.  Aortic and branch vessel atherosclerosis. 3. Pulmonary artery enlargement suggests pulmonary arterial hypertension. Electronically Signed   By: Abigail Miyamoto M.D.   On: 04/17/2020 16:05     Discharge Medications: Allergies as of 04/21/2020      Reactions   Bee Venom Swelling   Massive swelling   Penicillins Other (See Comments)   ** tolerates Zosyn + cephalosporins Did it involve swelling of the face/tongue/throat, SOB, or low BP? Unknown Did it involve sudden or severe rash/hives, skin peeling, or any reaction on the inside of your mouth or nose? Unknown Did you need to seek medical attention at a hospital or doctor's office? Unknown When did it last happen?1945 If all above answers are "NO", may proceed with cephalosporin use.   Adhesive [tape] Rash   Morphine And Related Rash   Sulfa  Antibiotics Nausea And Vomiting, Rash      Medication List    STOP taking these medications   nitroGLYCERIN 0.4 MG SL tablet Commonly known as: NITROSTAT     TAKE these medications   acetaminophen 500 MG tablet Commonly known as: TYLENOL Take 500 mg by mouth every 6 (six) hours as needed for mild pain.   aspirin EC 81 MG tablet Take 81 mg by mouth at bedtime. What changed: Another medication with the same name was removed. Continue taking this medication, and follow the directions you see here.   atorvastatin 80 MG tablet Commonly known as: LIPITOR Take 80 mg by mouth daily.   cephALEXin 500 MG capsule Commonly known as: Keflex Take 1 capsule (500 mg total) by mouth 3 (three) times daily. What changed: when to take this   cholecalciferol 1000 units tablet Commonly known as: VITAMIN D Take 1,000 Units by mouth daily at 12 noon.   fluticasone 50 MCG/ACT nasal spray Commonly known as: FLONASE Place 2 sprays into both nostrils daily.   isosorbide mononitrate 30 MG 24 hr tablet Commonly known as: IMDUR Take 0.5 tablets (15 mg total) by mouth daily.   levothyroxine 200 MCG tablet Commonly known as: SYNTHROID Take 100-200 mcg by mouth See admin instructions. Take 1/2 tablet (100 mcg) by mouth on Monday, Wednesday, Friday; take 1 tablet (200 mcg) on Sunday, Tuesday, Thursday, Saturday  DO NOT USE GENERIC   lisinopril 20 MG tablet Commonly known as: ZESTRIL Take 20 mg by mouth daily.   metoprolol tartrate 25 MG tablet Commonly known as: LOPRESSOR Take 1 tablet (25 mg total) by mouth 2 (two) times daily. What changed: when to take this   prenatal multivitamin Tabs tablet Take 1 tablet by mouth daily at 12 noon.   rivaroxaban 20 MG Tabs tablet Commonly known as: Xarelto Take 1 tablet (20 mg total) by mouth daily with supper. What changed: when to take this   sertraline 50 MG tablet Commonly known as: ZOLOFT Take 1 tablet (50 mg total) by mouth at bedtime. What  changed: how much to take   traMADol 50 MG tablet Commonly known as: Ultram Take 1 tablet (50 mg total) by mouth every 6 (six) hours as needed.   vitamin B-12 1000 MCG tablet Commonly known as: CYANOCOBALAMIN Take 1,000 mcg by mouth daily at 12 noon.   VITAMIN E PO Take 1 capsule by mouth daily at 12 noon.       Follow Up Appointments: Follow-up Information    [facility] Follow up.   Specialty: Home Health Services Why: Wound VAC to be changed three times weekly Contact information: [place] [address] [phone]        [name], MD Follow up.   Specialty: Cardiothoracic Surgery Why: Office will call with appointment date and time (one week). Please bring wound VAC and supplies to office appointment Contact information: [address] [place] [address] [phone]           Signed: Sharalyn Ink [facility] 04/21/2020, 12:22 PM

## 2020-04-21 NOTE — Care Management (Signed)
Wound vac will be delivered from [facility] today.  Patient to d/c home with [facility] services ([facility]) to resume  Monday.

## 2020-04-21 NOTE — Progress Notes (Signed)
Billie-Lynn D Scarano to be D/C'd  per MD order. Discussed with the patient and all questions fully answered.  VSS, Skin clean, dry and intact without evidence of skin break down, no evidence of skin tears noted.  IV catheter discontinued intact. Site without signs and symptoms of complications. Dressing and pressure applied.  An After Visit Summary was printed and given to the patient. Patient received prescription.  Taught patient how to use the wound VAC.   D/c education completed with patient/family including follow up instructions, medication list, d/c activities limitations if indicated, with other d/c instructions as indicated by MD - patient able to verbalize understanding, all questions fully answered.   Patient instructed to return to ED, call 911, or call MD for any changes in condition.   Patient to be escorted via [place], and D/C home via private auto.

## 2020-04-23 ENCOUNTER — Telehealth: Payer: Self-pay

## 2020-04-23 DIAGNOSIS — Z86711 Personal history of pulmonary embolism: Secondary | ICD-10-CM | POA: Diagnosis not present

## 2020-04-23 DIAGNOSIS — Z950 Presence of cardiac pacemaker: Secondary | ICD-10-CM | POA: Diagnosis not present

## 2020-04-23 DIAGNOSIS — R1310 Dysphagia, unspecified: Secondary | ICD-10-CM | POA: Diagnosis not present

## 2020-04-23 DIAGNOSIS — T8149XA Infection following a procedure, other surgical site, initial encounter: Secondary | ICD-10-CM | POA: Diagnosis not present

## 2020-04-23 DIAGNOSIS — I2510 Atherosclerotic heart disease of native coronary artery without angina pectoris: Secondary | ICD-10-CM | POA: Diagnosis not present

## 2020-04-23 DIAGNOSIS — I69320 Aphasia following cerebral infarction: Secondary | ICD-10-CM | POA: Diagnosis not present

## 2020-04-23 DIAGNOSIS — Z7901 Long term (current) use of anticoagulants: Secondary | ICD-10-CM | POA: Diagnosis not present

## 2020-04-23 DIAGNOSIS — Z86011 Personal history of benign neoplasm of the brain: Secondary | ICD-10-CM | POA: Diagnosis not present

## 2020-04-23 DIAGNOSIS — B961 Klebsiella pneumoniae [K. pneumoniae] as the cause of diseases classified elsewhere: Secondary | ICD-10-CM | POA: Diagnosis not present

## 2020-04-23 DIAGNOSIS — H9193 Unspecified hearing loss, bilateral: Secondary | ICD-10-CM | POA: Diagnosis not present

## 2020-04-23 DIAGNOSIS — I10 Essential (primary) hypertension: Secondary | ICD-10-CM | POA: Diagnosis not present

## 2020-04-23 DIAGNOSIS — Z85850 Personal history of malignant neoplasm of thyroid: Secondary | ICD-10-CM | POA: Diagnosis not present

## 2020-04-23 DIAGNOSIS — E039 Hypothyroidism, unspecified: Secondary | ICD-10-CM | POA: Diagnosis not present

## 2020-04-23 DIAGNOSIS — D649 Anemia, unspecified: Secondary | ICD-10-CM | POA: Diagnosis not present

## 2020-04-23 DIAGNOSIS — I69391 Dysphagia following cerebral infarction: Secondary | ICD-10-CM | POA: Diagnosis not present

## 2020-04-23 DIAGNOSIS — E785 Hyperlipidemia, unspecified: Secondary | ICD-10-CM | POA: Diagnosis not present

## 2020-04-23 DIAGNOSIS — I69354 Hemiplegia and hemiparesis following cerebral infarction affecting left non-dominant side: Secondary | ICD-10-CM | POA: Diagnosis not present

## 2020-04-23 DIAGNOSIS — N390 Urinary tract infection, site not specified: Secondary | ICD-10-CM | POA: Diagnosis not present

## 2020-04-23 NOTE — Telephone Encounter (Signed)
Pt calls to report localized rash beneath both breasts, s/p sternal wound vac placement on 04/20/20 by Dr. Orvan Seen. She states she hasn't taken any new medications, but reports sensitivity/allergy to adhesives. She wants to know if she can take an OTC antihistamine to help w/ rash. Informed that this would be acceptable. To call [facility] prn other problems or concerns.

## 2020-04-27 DIAGNOSIS — N390 Urinary tract infection, site not specified: Secondary | ICD-10-CM | POA: Diagnosis not present

## 2020-04-27 DIAGNOSIS — R300 Dysuria: Secondary | ICD-10-CM | POA: Diagnosis not present

## 2020-04-30 ENCOUNTER — Other Ambulatory Visit: Payer: Self-pay

## 2020-04-30 ENCOUNTER — Ambulatory Visit: Payer: PPO | Admitting: Neurology

## 2020-04-30 ENCOUNTER — Encounter: Payer: Self-pay | Admitting: Neurology

## 2020-04-30 ENCOUNTER — Ambulatory Visit (INDEPENDENT_AMBULATORY_CARE_PROVIDER_SITE_OTHER): Payer: Self-pay | Admitting: Cardiothoracic Surgery

## 2020-04-30 VITALS — BP 160/79 | HR 81 | Temp 97.9°F | Resp 20 | Ht 63.0 in | Wt 133.0 lb

## 2020-04-30 VITALS — BP 119/66 | HR 72 | Ht 63.0 in | Wt 133.0 lb

## 2020-04-30 DIAGNOSIS — Q211 Atrial septal defect: Secondary | ICD-10-CM | POA: Diagnosis not present

## 2020-04-30 DIAGNOSIS — Q2112 Patent foramen ovale: Secondary | ICD-10-CM | POA: Insufficient documentation

## 2020-04-30 DIAGNOSIS — I6602 Occlusion and stenosis of left middle cerebral artery: Secondary | ICD-10-CM

## 2020-04-30 DIAGNOSIS — Z951 Presence of aortocoronary bypass graft: Secondary | ICD-10-CM

## 2020-04-30 MED ORDER — RIVAROXABAN 20 MG PO TABS
20.0000 mg | ORAL_TABLET | Freq: Every day | ORAL | 0 refills | Status: DC
Start: 2020-04-30 — End: 2020-04-30

## 2020-04-30 MED ORDER — RIVAROXABAN 20 MG PO TABS: 20.0000 mg | ORAL_TABLET | Freq: Every day | ORAL | 0 refills | Status: DC

## 2020-04-30 NOTE — Patient Instructions (Signed)
I had a long d/w patient about her recent embolic stroke, DVT, pulmonary embolism and PFO and paradoxical embolism risk for recurrent stroke/TIAs, personally independently reviewed imaging studies and stroke evaluation results and answered questions.Continue Xarelto (rivaroxaban) daily  for secondary stroke prevention and maintain strict control of hypertension with blood pressure goal below 130/90, diabetes with hemoglobin A1c goal below 6.5% and lipids with LDL cholesterol goal below 70 mg/dL. I also advised the patient to eat a healthy diet with plenty of whole grains, cereals, fruits and vegetables, exercise regularly and maintain ideal body weight.  Given the patient's age and the fact that she needs lifelong anticoagulation she is not a candidate for endovascular PFO closure .followup in the future with my nurse practitioner Janett Billow in 2 months or call earlier if necessary.  Stroke Prevention Some medical conditions and behaviors are associated with a higher chance of having a stroke. You can help prevent a stroke by making nutrition, lifestyle, and other changes, including managing any medical conditions you may have. What nutrition changes can be made?   Eat healthy foods. You can do this by: ? Choosing foods high in fiber, such as fresh fruits and vegetables and whole grains. ? Eating at least 5 or more servings of fruits and vegetables a day. Try to fill half of your plate at each meal with fruits and vegetables. ? Choosing lean protein foods, such as lean cuts of meat, poultry without skin, fish, tofu, beans, and nuts. ? Eating low-fat dairy products. ? Avoiding foods that are high in salt (sodium). This can help lower blood pressure. ? Avoiding foods that have saturated fat, trans fat, and cholesterol. This can help prevent high cholesterol. ? Avoiding processed and premade foods.  Follow your health care provider's specific guidelines for losing weight, controlling high blood pressure  (hypertension), lowering high cholesterol, and managing diabetes. These may include: ? Reducing your daily calorie intake. ? Limiting your daily sodium intake to 1,500 milligrams (mg). ? Using only healthy fats for cooking, such as olive oil, canola oil, or sunflower oil. ? Counting your daily carbohydrate intake. What lifestyle changes can be made?  Maintain a healthy weight. Talk to your health care provider about your ideal weight.  Get at least 30 minutes of moderate physical activity at least 5 days a week. Moderate activity includes brisk walking, biking, and swimming.  Do not use any products that contain nicotine or tobacco, such as cigarettes and e-cigarettes. If you need help quitting, ask your health care provider. It may also be helpful to avoid exposure to secondhand smoke.  Limit alcohol intake to no more than 1 drink a day for nonpregnant women and 2 drinks a day for men. One drink equals 12 oz of beer, 5 oz of wine, or 1 oz of hard liquor.  Stop any illegal drug use.  Avoid taking birth control pills. Talk to your health care provider about the risks of taking birth control pills if: ? You are over 70 years old. ? You smoke. ? You get migraines. ? You have ever had a blood clot. What other changes can be made?  Manage your cholesterol levels. ? Eating a healthy diet is important for preventing high cholesterol. If cholesterol cannot be managed through diet alone, you may also need to take medicines. ? Take any prescribed medicines to control your cholesterol as told by your health care provider.  Manage your diabetes. ? Eating a healthy diet and exercising regularly are important parts of managing  your blood sugar. If your blood sugar cannot be managed through diet and exercise, you may need to take medicines. ? Take any prescribed medicines to control your diabetes as told by your health care provider.  Control your hypertension. ? To reduce your risk of stroke, try  to keep your blood pressure below 130/80. ? Eating a healthy diet and exercising regularly are an important part of controlling your blood pressure. If your blood pressure cannot be managed through diet and exercise, you may need to take medicines. ? Take any prescribed medicines to control hypertension as told by your health care provider. ? Ask your health care provider if you should monitor your blood pressure at home. ? Have your blood pressure checked every year, even if your blood pressure is normal. Blood pressure increases with age and some medical conditions.  Get evaluated for sleep disorders (sleep apnea). Talk to your health care provider about getting a sleep evaluation if you snore a lot or have excessive sleepiness.  Take over-the-counter and prescription medicines only as told by your health care provider. Aspirin or blood thinners (antiplatelets or anticoagulants) may be recommended to reduce your risk of forming blood clots that can lead to stroke.  Make sure that any other medical conditions you have, such as atrial fibrillation or atherosclerosis, are managed. What are the warning signs of a stroke? The warning signs of a stroke can be easily remembered as BEFAST.  B is for balance. Signs include: ? Dizziness. ? Loss of balance or coordination. ? Sudden trouble walking.  E is for eyes. Signs include: ? A sudden change in vision. ? Trouble seeing.  F is for face. Signs include: ? Sudden weakness or numbness of the face. ? The face or eyelid drooping to one side.  A is for arms. Signs include: ? Sudden weakness or numbness of the arm, usually on one side of the body.  S is for speech. Signs include: ? Trouble speaking (aphasia). ? Trouble understanding.  T is for time. ? These symptoms may represent a serious problem that is an emergency. Do not wait to see if the symptoms will go away. Get medical help right away. Call your local emergency services (911 in the  [place] Do not drive yourself to the hospital.  Other signs of stroke may include: ? A sudden, severe headache with no known cause. ? Nausea or vomiting. ? Seizure. Where to find more information For more information, visit:  American Stroke Association: www.strokeassociation.org  National Stroke Association: www.stroke.org Summary  You can prevent a stroke by eating healthy, exercising, not smoking, limiting alcohol intake, and managing any medical conditions you may have.  Do not use any products that contain nicotine or tobacco, such as cigarettes and e-cigarettes. If you need help quitting, ask your health care provider. It may also be helpful to avoid exposure to secondhand smoke.  Remember BEFAST for warning signs of stroke. Get help right away if you or a loved one has any of these signs. This information is not intended to replace advice given to you by your health care provider. Make sure you discuss any questions you have with your health care provider. Document Revised: 10/16/2017 Document Reviewed: 12/09/2016 Elsevier Patient Education  2020 [place], Adult   A foramen ovale is a hole between the upper chambers (right atrium and left atrium) of the heart. Before you are born, it is normal to have this hole in your heart. The hole  allows blood to circulate through the body without having to go through the lungs. After your birth, when you are able to breathe, you do not need the foramen ovale and it usually closes. If the hole does not close, it is called a patent foramen ovale (PFO). PFO is a common condition. Most people do not know they have this hole and they do not have any health problems caused by it. What are the causes? The cause of this condition is not known. What increases the risk? You are more likely to develop this condition if:  You have a family history of PFO.  You also have other heart disease that is present at birth  (congenital heart disease). What are the signs or symptoms? In most cases, there are no symptoms of this condition. Possible rare symptoms include:  Stroke caused by a blood clot.  Transient ischemic attack (TIA). This is a "warning stroke" that causes stroke-like symptoms that go away quickly.  Migraine headaches. How is this diagnosed? This condition may be diagnosed based on:  A physical exam and your medical history.  Echocardiogram. This test uses sound waves to produce images of the heart.  Transesophageal echocardiogram (TEE). This type of echocardiogram is performed by placing a probe in the part of the body that moves food from the mouth to the stomach (esophagus).  Electrocardiogram (ECG). This test identifies changes in the electrical activity of the heart.  Cardiac MRI. This is an imaging technique that is used to visualize the heart, if further images are needed after TEE. How is this treated? Usually, no treatment is needed. If your condition is associated with symptoms or blood clots, you may need:  Medicines to prevent blood clots and strokes (anticoagulant or antiplatelet medicines).  A surgical procedure to close the hole (transcatheter closure). Follow these instructions at home:  Take over-the-counter and prescription medicines only as told by your health care provider.  Keep all follow-up visits as told by your health care provider. This is important. Contact a health care provider if:  You have a fever.  You have frequent or severe headaches. Get help right away if:  Your skin turns blue.  You have chest pain or difficulty breathing.  You have any symptoms of stroke. "BE FAST" is an easy way to remember the main warning signs of stroke: ? B - Balance. Signs are dizziness, sudden trouble walking, or loss of balance. ? E - Eyes. Signs are trouble seeing or a sudden change in vision. ? F - Face. Signs are sudden weakness or numbness of the face, or the  face or eyelid drooping on one side. ? A - Arms. Signs are weakness or numbness in an arm. This happens suddenly and usually on one side of the body. ? S - Speech. Signs are sudden trouble speaking, slurred speech, or trouble understanding what people say. ? T - Time. Time to call emergency services. Write down what time symptoms started.  You have other signs of stroke, which may include: ? A sudden, severe headache with no known cause. ? Nausea or vomiting. ? Seizure. These symptoms may represent a serious problem that is an emergency. Do not wait to see if the symptoms will go away. Get medical help right away. Call your local emergency services (911 in the [place] Do not drive yourself to the hospital. Summary  A patent foramen ovale is a hole between the upper chambers (right atrium and left atrium) of your heart. The cause  of this condition is not known.  You may not know that you have a hole in your heart, and you may not have any health problems associated with it.  Usually, no treatment is needed for this condition unless it is associated with symptoms or blood clots. This information is not intended to replace advice given to you by your health care provider. Make sure you discuss any questions you have with your health care provider. Document Revised: 01/08/2018 Document Reviewed: 01/13/2017 Elsevier Patient Education  2020 [employer].

## 2020-04-30 NOTE — Progress Notes (Signed)
[facility] [address] [place]. [place] [address] [phone]       OFFICE CONSULT NOTE  Ms. [name] [name] Date of Birth:  [birth year] Medical Record Number:  [id number]   Referring MD: [name]  Reason for Referral: Stroke  HPI: Ms. [name] [name] is a pleasant 79 year old Caucasian lady seen today for initial office consultation visit for stroke.  History is obtained from the patient, review of electronic medical records and I personally reviewed imaging films in PACS.  She has past medical history of coronary artery disease, hypertension, hyperlipidemia, thyroid cancer who presented to [facility] for three-vessel CABG which was done on 01/28/2020.  On postop day 3 she developed sudden onset of weakness while walking with her RN in the unit where her knees buckled and she started to feel lightheaded and then became less responsive and nonverbal and had gaze deviation to the left.  A code stroke was activated.  NIH stroke scale was 21.  She is not a candidate for TPA due to recent cardiac surgery.  CT angiogram of the head and neck showed left M1 occlusion and CT perfusion showed a core of 74 cubic cc with the penumbra of 85 cubic cc.  After discussion with family she was taken for emergent mechanical thrombectomy which was performed successfully with TICI 3 reperfusion by Dr. Manson Passey.  She did well and was extubated.  She had mild expressive language difficulties and right hemiparesis.  MRI could not be obtained due to surgical staples CT scan showed a small left frontal cortical and possibly right basal ganglia infarct.  2D echo showed ejection fraction of 30 to 35%.  Intraoperative TEE during cardiac surgery showed a PFO.  LDL cholesterol was 28 mg percent.  Hemoglobin A1c was 5.1.  Lower extremity venous Dopplers showed left posterior tibial vein DVT.  She was started initially on heparin per stroke protocol and subsequently change to Eliquis.  Plavix was  discontinued and aspirin 81 mg was added for her coronary artery disease.  She subsequently was transferred to rehab in she is done well currently she is living at home.  She is feels she has a little bit of hesitation in her speech and occasionally struggles to find words.  She also finds it takes longer for her to process information and to come back with answers.  Her right-sided strength is improving.  She is able to ambulate independently without assistance.  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  She is living alone and is independent.  Recently her primary care physician change Eliquis to Xarelto which she is tolerating well.  She does get minor bruising but no bleeding.  Her blood pressure is under good control today it is 119/64.  She is tolerating Lipitor well without muscle aches and pains.  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 Coronary artery disease   . Family history of adverse reaction to anesthesia    " My Sister did "  . HTN (hypertension)   . Hyperlipidemia   . Hypothyroidism   .  Osteoporosis   . Stroke ([place])   . Thyroid carcinoma ([place])     Social History:  Social History   Socioeconomic History  . Marital status: Divorced    Spouse name: Not on file  . Number of children: Not on file  . Years of education: Not on file  . Highest education level: Not on file  Occupational History  . Occupation: retired Designer, jewellery  Tobacco Use  . Smoking status: Never Smoker  . Smokeless tobacco: Never Used  Vaping Use  . Vaping Use: Never used  Substance  and Sexual Activity  . Alcohol use: Not Currently  . Drug use: Never  . Sexual activity: Not on file  Other Topics Concern  . Not on file  Social History Narrative  . Not on file   Social Determinants of Health   Financial Resource Strain:   . Difficulty of Paying Living Expenses:   Food Insecurity:   . Worried About Charity fundraiser in the Last Year:   . Arboriculturist in the Last Year:   Transportation Needs:   . Film/video editor (Medical):   Marland Kitchen Lack of Transportation (Non-Medical):   Physical Activity:   . Days of Exercise per Week:   . Minutes of Exercise per Session:   Stress:   . Feeling of Stress :   Social Connections:   . Frequency of Communication with Friends and Family:   . Frequency of Social Gatherings with Friends and Family:   . Attends Religious Services:   . Active Member of Clubs or Organizations:   . Attends Archivist Meetings:   Marland Kitchen Marital Status:   Intimate Partner Violence:   . Fear of Current or Ex-Partner:   . Emotionally Abused:   Marland Kitchen Physically Abused:   . Sexually Abused:     Medications:   Current Outpatient Medications on File Prior to Visit  Medication Sig Dispense Refill  . acetaminophen (TYLENOL) 500 MG tablet Take 500 mg by mouth every 6 (six) hours as needed for mild pain.    Marland Kitchen aspirin EC 81 MG tablet Take 81 mg by mouth at bedtime.    Marland Kitchen atorvastatin (LIPITOR) 80 MG tablet Take 80 mg by mouth daily.     . cholecalciferol (VITAMIN D) 1000 UNITS tablet Take 1,000 Units by mouth daily at 12 noon.     . fluticasone (FLONASE) 50 MCG/ACT nasal spray Place 2 sprays into both nostrils daily.    . isosorbide mononitrate (IMDUR) 30 MG 24 hr tablet Take 0.5 tablets (15 mg total) by mouth daily.    Marland Kitchen levothyroxine (SYNTHROID, LEVOTHROID) 200 MCG tablet Take 100-200 mcg by mouth See admin instructions. Take 1/2 tablet (100 mcg) by mouth on Monday, Wednesday, Friday; take 1 tablet (200 mcg) on Sunday, Tuesday, Thursday, Saturday    DO NOT USE GENERIC    . lisinopril (ZESTRIL) 20 MG tablet Take 20 mg by mouth daily.     . Prenatal Vit-Fe Fumarate-FA (PRENATAL MULTIVITAMIN) TABS tablet Take 1 tablet by mouth daily at 12 noon.    . sertraline (ZOLOFT) 50 MG tablet Take 1 tablet (50 mg total) by mouth at bedtime. (Patient taking differently: Take 25 mg by mouth at bedtime. )    . vitamin B-12 (CYANOCOBALAMIN) 1000 MCG tablet Take 1,000 mcg by mouth daily at 12 noon.     . metoprolol tartrate (LOPRESSOR) 25 MG tablet Take 1 tablet (25 mg total) by mouth 2 (two) times daily. (Patient taking  differently: Take 25 mg by mouth 2 (two) times daily after a meal. ) 180 tablet 1   No current facility-administered medications on file prior to visit.    Allergies:   Allergies  Allergen Reactions  . Bee Venom Swelling    Massive swelling  . Penicillins Other (See Comments)    ** tolerates Zosyn + cephalosporins Did it involve swelling of the face/tongue/throat, SOB, or low BP? Unknown Did it involve sudden or severe rash/hives, skin peeling, or any reaction on the inside of your mouth or nose? Unknown Did you need to seek medical attention at a hospital or doctor's office? Unknown When did it last happen?1945 If all above answers are "NO", may proceed with cephalosporin use.  . Adhesive [Tape] Rash  . Morphine And Related Rash  . Sulfa Antibiotics Nausea And Vomiting and Rash    Physical Exam General: Frail elderly Caucasian lady, seated, in no evident distress Head: head normocephalic and atraumatic.   Neck: supple with no carotid or supraclavicular bruits Cardiovascular: regular rate and rhythm, no murmurs Musculoskeletal: no deformity Skin:  no rash/petichiae.. Large midline sternal wound with drainage catheter from midline sterniotomy  vascular:  Normal pulses all extremities  Neurologic Exam Mental Status: Awake and fully alert. Oriented to place and time. Recent and remote memory intact. Attention span,  concentration and fund of knowledge appropriate. Mood and affect appropriate.  Speech is slightly nonfluent with some word hesitancy and mild of expressive aphasia.  But no paraphasic errors. Cranial Nerves: Fundoscopic exam reveals sharp disc margins. Pupils equal, briskly reactive to light. Extraocular movements full without nystagmus. Visual fields full to confrontation. Hearing intact. Facial sensation intact. Face, tongue, palate moves normally and symmetrically.  Motor: Normal bulk and tone. Normal strength in all tested extremity muscles. Sensory.: intact to touch , pinprick , position and vibratory sensation.  Coordination: Rapid alternating movements normal in all extremities. Finger-to-nose and heel-to-shin performed accurately bilaterally. Gait and Station: Arises from chair without difficulty. Stance is normal. Gait demonstrates normal stride length and balance . Able to heel, toe and tandem walk without difficulty.  Reflexes: 1+ and symmetric. Toes downgoing.   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  She underwent mechanical thrombectomy and is doing extremely well with hardly any residual deficits.     PLAN: I had a long d/w patient about her recent embolic stroke, DVT, pulmonary embolism and PFO and paradoxical embolism risk for recurrent stroke/TIAs, personally independently reviewed imaging studies and stroke evaluation results and answered questions.Continue Xarelto (rivaroxaban) daily  for secondary stroke prevention and maintain strict control of hypertension with blood pressure goal below 130/90, diabetes with hemoglobin A1c goal below 6.5% and lipids with LDL cholesterol goal below 70 mg/dL. I also advised the patient to eat a healthy diet with plenty of whole grains, cereals, fruits and vegetables, exercise  regularly and maintain ideal body weight.  Given the patient's age and the fact that she needs lifel ong anticoagulation she is not a candidate for endovascular PFO closure .greater than 50% time during this 45-minute consultation visit was spent on counseling and coordination of care about her embolic stroke, patent foramen ovale, DVT and pulmonary embolism and discussion about treatment and prevention and answering questions followup in the future with my nurse practitioner Janett Billow in 2 months or call earlier if  necessary. Antony Contras, MD  [facility] [address] [place], [place] [address]  Phone [phone] Fax [phone] Note: This document was prepared with digital dictation and possible smart phrase technology. Any transcriptional errors that result from this process are unintentional.

## 2020-05-01 NOTE — Progress Notes (Signed)
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  This was associated with some turbid fluid.  She underwent examination under anesthesia and wound VAC placement approximately 10 days ago.  She now presents to the office for wound inspection.  She states she has been doing well on the dressing changes at home have been fine.  Current Outpatient Medications  Medication Sig Dispense Refill  . acetaminophen (TYLENOL) 500 MG tablet Take 500 mg by mouth every 6 (six) hours as needed for mild pain.    Marland Kitchen aspirin EC 81 MG tablet Take 81 mg by mouth at bedtime.    Marland Kitchen atorvastatin (LIPITOR) 80 MG tablet Take 80 mg by mouth daily.     . cholecalciferol (VITAMIN D) 1000 UNITS tablet Take 1,000 Units by mouth daily at 12 noon.     . fluticasone (FLONASE) 50 MCG/ACT nasal spray Place 2 sprays into both nostrils daily.    . isosorbide mononitrate (IMDUR) 30 MG 24 hr tablet Take 0.5 tablets (15 mg total) by mouth daily.    Marland Kitchen levothyroxine (SYNTHROID, LEVOTHROID) 200 MCG tablet Take 100-200 mcg by mouth See admin instructions. Take 1/2 tablet (100 mcg) by mouth on Monday, Wednesday, Friday; take 1 tablet (200 mcg) on Sunday, Tuesday, Thursday, Saturday   DO NOT USE GENERIC    . lisinopril (ZESTRIL) 20 MG tablet Take 20 mg by mouth daily.     . nitrofurantoin, macrocrystal-monohydrate, (MACROBID) 100 MG capsule Take 100 mg by mouth 2 (two) times daily.    . Prenatal Vit-Fe Fumarate-FA (PRENATAL MULTIVITAMIN) TABS tablet Take 1 tablet by mouth daily at 12 noon.    . rivaroxaban (XARELTO) 20 MG TABS tablet Take 1 tablet (20 mg total) by mouth daily with supper. 1 tablet 0  . sertraline (ZOLOFT) 50 MG tablet Take 1  tablet (50 mg total) by mouth at bedtime. (Patient taking differently: Take 25 mg by mouth at bedtime. )    . vitamin B-12 (CYANOCOBALAMIN) 1000 MCG tablet Take 1,000 mcg by mouth daily at 12 noon.     . metoprolol tartrate (LOPRESSOR) 25 MG tablet Take 1 tablet (25 mg total) by mouth 2 (two) times daily. (Patient taking differently: Take 25 mg by mouth 2 (two) times daily after a meal. ) 180 tablet 1   No current facility-administered medications for this visit.      Physical Exam:   BP (!) 160/79 (BP Location: Right Arm, Patient Position: Sitting, Cuff Size: Normal)   Pulse 81   Temp 97.9 F (36.6 C) (Temporal)   Resp 20   Ht 5\' 3"  (1.6 m)   Wt 60.3 kg   SpO2 93% Comment: RA  BMI 23.56 kg/m   General:  Well-appearing no acute distress  Chest:   Clear to auscultation  CV:   Regular rate and rhythm  Incisions:  The wound VAC dressing is taken down.  There is fibrinous debris at the base of the incision which is manually debrided.  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  Currently treated with VAC therapy  Plan:  Follow-up on Friday of this week for additional wound evaluation Continue VAC therapy in the meantime  I spent in excess of 20 minutes during the conduct of this office consultation and >50% of this time involved direct face-to-face encounter with the patient for counseling and/or coordination of their care.  Level 2                 10 minutes Level 3                 15 minutes Level 4                 25 minutes Level 5                 40 minutes  B. Murvin Natal, MD 05/01/2020 5:52 AM

## 2020-05-02 DIAGNOSIS — B961 Klebsiella pneumoniae [K. pneumoniae] as the cause of diseases classified elsewhere: Secondary | ICD-10-CM | POA: Diagnosis not present

## 2020-05-02 DIAGNOSIS — R1310 Dysphagia, unspecified: Secondary | ICD-10-CM | POA: Diagnosis not present

## 2020-05-02 DIAGNOSIS — T8149XA Infection following a procedure, other surgical site, initial encounter: Secondary | ICD-10-CM | POA: Diagnosis not present

## 2020-05-02 DIAGNOSIS — E039 Hypothyroidism, unspecified: Secondary | ICD-10-CM | POA: Diagnosis not present

## 2020-05-02 DIAGNOSIS — I69391 Dysphagia following cerebral infarction: Secondary | ICD-10-CM | POA: Diagnosis not present

## 2020-05-02 DIAGNOSIS — I69354 Hemiplegia and hemiparesis following cerebral infarction affecting left non-dominant side: Secondary | ICD-10-CM | POA: Diagnosis not present

## 2020-05-02 DIAGNOSIS — E785 Hyperlipidemia, unspecified: Secondary | ICD-10-CM | POA: Diagnosis not present

## 2020-05-02 DIAGNOSIS — D649 Anemia, unspecified: Secondary | ICD-10-CM | POA: Diagnosis not present

## 2020-05-02 DIAGNOSIS — N390 Urinary tract infection, site not specified: Secondary | ICD-10-CM | POA: Diagnosis not present

## 2020-05-02 DIAGNOSIS — Z85850 Personal history of malignant neoplasm of thyroid: Secondary | ICD-10-CM | POA: Diagnosis not present

## 2020-05-02 DIAGNOSIS — Z7901 Long term (current) use of anticoagulants: Secondary | ICD-10-CM | POA: Diagnosis not present

## 2020-05-02 DIAGNOSIS — Z950 Presence of cardiac pacemaker: Secondary | ICD-10-CM | POA: Diagnosis not present

## 2020-05-02 DIAGNOSIS — Z86011 Personal history of benign neoplasm of the brain: Secondary | ICD-10-CM | POA: Diagnosis not present

## 2020-05-02 DIAGNOSIS — I10 Essential (primary) hypertension: Secondary | ICD-10-CM | POA: Diagnosis not present

## 2020-05-02 DIAGNOSIS — I2510 Atherosclerotic heart disease of native coronary artery without angina pectoris: Secondary | ICD-10-CM | POA: Diagnosis not present

## 2020-05-02 DIAGNOSIS — Z86711 Personal history of pulmonary embolism: Secondary | ICD-10-CM | POA: Diagnosis not present

## 2020-05-02 DIAGNOSIS — H9193 Unspecified hearing loss, bilateral: Secondary | ICD-10-CM | POA: Diagnosis not present

## 2020-05-02 DIAGNOSIS — I69320 Aphasia following cerebral infarction: Secondary | ICD-10-CM | POA: Diagnosis not present

## 2020-05-04 ENCOUNTER — Inpatient Hospital Stay (HOSPITAL_COMMUNITY): Payer: PPO

## 2020-05-04 ENCOUNTER — Other Ambulatory Visit: Payer: Self-pay

## 2020-05-04 ENCOUNTER — Inpatient Hospital Stay (HOSPITAL_COMMUNITY)
Admission: AD | Admit: 2020-05-04 | Discharge: 2020-05-15 | DRG: 856 | Disposition: A | Discharge status: Skilled Nursing Facility | Payer: PPO | Source: Ambulatory Visit | Attending: Cardiothoracic Surgery | Admitting: Cardiothoracic Surgery

## 2020-05-04 ENCOUNTER — Ambulatory Visit (INDEPENDENT_AMBULATORY_CARE_PROVIDER_SITE_OTHER): Payer: PPO | Admitting: Cardiothoracic Surgery

## 2020-05-04 ENCOUNTER — Encounter: Payer: Self-pay | Admitting: Cardiothoracic Surgery

## 2020-05-04 VITALS — BP 108/63 | HR 75 | Temp 98.5°F | Resp 20 | Ht 63.0 in

## 2020-05-04 DIAGNOSIS — R2689 Other abnormalities of gait and mobility: Secondary | ICD-10-CM | POA: Diagnosis not present

## 2020-05-04 DIAGNOSIS — Z8616 Personal history of COVID-19: Secondary | ICD-10-CM | POA: Diagnosis not present

## 2020-05-04 DIAGNOSIS — Z880 Allergy status to penicillin: Secondary | ICD-10-CM | POA: Diagnosis not present

## 2020-05-04 DIAGNOSIS — T8149XA Infection following a procedure, other surgical site, initial encounter: Principal | ICD-10-CM | POA: Diagnosis present

## 2020-05-04 DIAGNOSIS — Z9889 Other specified postprocedural states: Secondary | ICD-10-CM

## 2020-05-04 DIAGNOSIS — E440 Moderate protein-calorie malnutrition: Secondary | ICD-10-CM | POA: Diagnosis present

## 2020-05-04 DIAGNOSIS — S21109A Unspecified open wound of unspecified front wall of thorax without penetration into thoracic cavity, initial encounter: Secondary | ICD-10-CM | POA: Diagnosis not present

## 2020-05-04 DIAGNOSIS — A498 Other bacterial infections of unspecified site: Secondary | ICD-10-CM | POA: Diagnosis present

## 2020-05-04 DIAGNOSIS — Z91030 Bee allergy status: Secondary | ICD-10-CM | POA: Diagnosis not present

## 2020-05-04 DIAGNOSIS — R41841 Cognitive communication deficit: Secondary | ICD-10-CM | POA: Diagnosis not present

## 2020-05-04 DIAGNOSIS — L8990 Pressure ulcer of unspecified site, unspecified stage: Secondary | ICD-10-CM | POA: Diagnosis present

## 2020-05-04 DIAGNOSIS — L089 Local infection of the skin and subcutaneous tissue, unspecified: Secondary | ICD-10-CM | POA: Diagnosis present

## 2020-05-04 DIAGNOSIS — J90 Pleural effusion, not elsewhere classified: Secondary | ICD-10-CM | POA: Diagnosis not present

## 2020-05-04 DIAGNOSIS — B9562 Methicillin resistant Staphylococcus aureus infection as the cause of diseases classified elsewhere: Secondary | ICD-10-CM | POA: Diagnosis present

## 2020-05-04 DIAGNOSIS — R5381 Other malaise: Secondary | ICD-10-CM | POA: Diagnosis not present

## 2020-05-04 DIAGNOSIS — Z882 Allergy status to sulfonamides status: Secondary | ICD-10-CM | POA: Diagnosis not present

## 2020-05-04 DIAGNOSIS — I10 Essential (primary) hypertension: Secondary | ICD-10-CM | POA: Diagnosis not present

## 2020-05-04 DIAGNOSIS — R54 Age-related physical debility: Secondary | ICD-10-CM | POA: Diagnosis not present

## 2020-05-04 DIAGNOSIS — E785 Hyperlipidemia, unspecified: Secondary | ICD-10-CM | POA: Diagnosis present

## 2020-05-04 DIAGNOSIS — S21101A Unspecified open wound of right front wall of thorax without penetration into thoracic cavity, initial encounter: Secondary | ICD-10-CM | POA: Diagnosis present

## 2020-05-04 DIAGNOSIS — R001 Bradycardia, unspecified: Secondary | ICD-10-CM | POA: Diagnosis not present

## 2020-05-04 DIAGNOSIS — A4902 Methicillin resistant Staphylococcus aureus infection, unspecified site: Secondary | ICD-10-CM | POA: Diagnosis present

## 2020-05-04 DIAGNOSIS — Z96649 Presence of unspecified artificial hip joint: Secondary | ICD-10-CM | POA: Diagnosis not present

## 2020-05-04 DIAGNOSIS — Z7902 Long term (current) use of antithrombotics/antiplatelets: Secondary | ICD-10-CM

## 2020-05-04 DIAGNOSIS — Z7982 Long term (current) use of aspirin: Secondary | ICD-10-CM | POA: Diagnosis not present

## 2020-05-04 DIAGNOSIS — Z79899 Other long term (current) drug therapy: Secondary | ICD-10-CM | POA: Diagnosis not present

## 2020-05-04 DIAGNOSIS — Z8673 Personal history of transient ischemic attack (TIA), and cerebral infarction without residual deficits: Secondary | ICD-10-CM

## 2020-05-04 DIAGNOSIS — M810 Age-related osteoporosis without current pathological fracture: Secondary | ICD-10-CM | POA: Diagnosis not present

## 2020-05-04 DIAGNOSIS — I2510 Atherosclerotic heart disease of native coronary artery without angina pectoris: Secondary | ICD-10-CM | POA: Diagnosis not present

## 2020-05-04 DIAGNOSIS — Z885 Allergy status to narcotic agent status: Secondary | ICD-10-CM | POA: Diagnosis not present

## 2020-05-04 DIAGNOSIS — Z86711 Personal history of pulmonary embolism: Secondary | ICD-10-CM | POA: Diagnosis present

## 2020-05-04 DIAGNOSIS — Z833 Family history of diabetes mellitus: Secondary | ICD-10-CM

## 2020-05-04 DIAGNOSIS — T8140XA Infection following a procedure, unspecified, initial encounter: Secondary | ICD-10-CM | POA: Diagnosis not present

## 2020-05-04 DIAGNOSIS — D649 Anemia, unspecified: Secondary | ICD-10-CM | POA: Diagnosis present

## 2020-05-04 DIAGNOSIS — R278 Other lack of coordination: Secondary | ICD-10-CM | POA: Diagnosis not present

## 2020-05-04 DIAGNOSIS — Z8249 Family history of ischemic heart disease and other diseases of the circulatory system: Secondary | ICD-10-CM

## 2020-05-04 DIAGNOSIS — Z79890 Hormone replacement therapy (postmenopausal): Secondary | ICD-10-CM | POA: Diagnosis not present

## 2020-05-04 DIAGNOSIS — I959 Hypotension, unspecified: Secondary | ICD-10-CM | POA: Diagnosis not present

## 2020-05-04 DIAGNOSIS — B965 Pseudomonas (aeruginosa) (mallei) (pseudomallei) as the cause of diseases classified elsewhere: Secondary | ICD-10-CM | POA: Diagnosis present

## 2020-05-04 DIAGNOSIS — J9851 Mediastinitis: Secondary | ICD-10-CM | POA: Diagnosis present

## 2020-05-04 DIAGNOSIS — T8189XA Other complications of procedures, not elsewhere classified, initial encounter: Secondary | ICD-10-CM | POA: Diagnosis present

## 2020-05-04 DIAGNOSIS — Z91048 Other nonmedicinal substance allergy status: Secondary | ICD-10-CM

## 2020-05-04 DIAGNOSIS — M9689 Other intraoperative and postprocedural complications and disorders of the musculoskeletal system: Secondary | ICD-10-CM | POA: Diagnosis present

## 2020-05-04 DIAGNOSIS — I639 Cerebral infarction, unspecified: Secondary | ICD-10-CM | POA: Diagnosis not present

## 2020-05-04 DIAGNOSIS — I517 Cardiomegaly: Secondary | ICD-10-CM | POA: Diagnosis not present

## 2020-05-04 DIAGNOSIS — F330 Major depressive disorder, recurrent, mild: Secondary | ICD-10-CM | POA: Diagnosis not present

## 2020-05-04 DIAGNOSIS — R279 Unspecified lack of coordination: Secondary | ICD-10-CM | POA: Diagnosis not present

## 2020-05-04 DIAGNOSIS — T8132XA Disruption of internal operation (surgical) wound, not elsewhere classified, initial encounter: Secondary | ICD-10-CM | POA: Diagnosis not present

## 2020-05-04 DIAGNOSIS — R2681 Unsteadiness on feet: Secondary | ICD-10-CM | POA: Diagnosis not present

## 2020-05-04 DIAGNOSIS — R296 Repeated falls: Secondary | ICD-10-CM | POA: Diagnosis present

## 2020-05-04 DIAGNOSIS — I6930 Unspecified sequelae of cerebral infarction: Secondary | ICD-10-CM | POA: Diagnosis not present

## 2020-05-04 DIAGNOSIS — E039 Hypothyroidism, unspecified: Secondary | ICD-10-CM | POA: Diagnosis not present

## 2020-05-04 DIAGNOSIS — M6281 Muscle weakness (generalized): Secondary | ICD-10-CM | POA: Diagnosis not present

## 2020-05-04 DIAGNOSIS — Q2112 Patent foramen ovale: Secondary | ICD-10-CM

## 2020-05-04 DIAGNOSIS — I25118 Atherosclerotic heart disease of native coronary artery with other forms of angina pectoris: Secondary | ICD-10-CM | POA: Diagnosis not present

## 2020-05-04 DIAGNOSIS — Z743 Need for continuous supervision: Secondary | ICD-10-CM | POA: Diagnosis not present

## 2020-05-04 DIAGNOSIS — Z9181 History of falling: Secondary | ICD-10-CM

## 2020-05-04 DIAGNOSIS — J811 Chronic pulmonary edema: Secondary | ICD-10-CM | POA: Diagnosis not present

## 2020-05-04 DIAGNOSIS — Z951 Presence of aortocoronary bypass graft: Secondary | ICD-10-CM

## 2020-05-04 DIAGNOSIS — J9811 Atelectasis: Secondary | ICD-10-CM | POA: Diagnosis not present

## 2020-05-04 DIAGNOSIS — T8131XD Disruption of external operation (surgical) wound, not elsewhere classified, subsequent encounter: Secondary | ICD-10-CM | POA: Diagnosis not present

## 2020-05-04 DIAGNOSIS — R4701 Aphasia: Secondary | ICD-10-CM | POA: Diagnosis not present

## 2020-05-04 LAB — BASIC METABOLIC PANEL
Anion gap: 9 (ref 5–15)
BUN: 30 mg/dL — ABNORMAL HIGH (ref 8–23)
CO2: 26 mmol/L (ref 22–32)
Calcium: 11 mg/dL — ABNORMAL HIGH (ref 8.9–10.3)
Chloride: 103 mmol/L (ref 98–111)
Creatinine, Ser: 0.95 mg/dL (ref 0.44–1.00)
GFR calc Af Amer: >60 mL/min (ref >60)
GFR calc non Af Amer: 58 mL/min — ABNORMAL LOW (ref >60)
Glucose, Bld: 121 mg/dL — ABNORMAL HIGH (ref 70–99)
Potassium: 3.7 mmol/L (ref 3.5–5.1)
Sodium: 138 mmol/L (ref 135–145)

## 2020-05-04 LAB — CBC
HCT: 33.9 % — ABNORMAL LOW (ref 36.0–46.0)
HCT: 29.9 % — ABNORMAL LOW (ref 36.0–46.0)
Hemoglobin: 10.8 g/dL — ABNORMAL LOW (ref 12.0–15.0)
Hemoglobin: 9.4 g/dL — ABNORMAL LOW (ref 12.0–15.0)
MCH: 26 pg (ref 26.0–34.0)
MCH: 25.5 pg — ABNORMAL LOW (ref 26.0–34.0)
MCHC: 31.9 g/dL (ref 30.0–36.0)
MCHC: 31.4 g/dL (ref 30.0–36.0)
MCV: 81.5 fL (ref 80.0–100.0)
MCV: 81 fL (ref 80.0–100.0)
Platelets: 423 10*3/uL — ABNORMAL HIGH (ref 150–400)
Platelets: 376 10*3/uL (ref 150–400)
RBC: 4.16 MIL/uL (ref 3.87–5.11)
RBC: 3.69 MIL/uL — ABNORMAL LOW (ref 3.87–5.11)
RDW: 15.9 % — ABNORMAL HIGH (ref 11.5–15.5)
RDW: 15.9 % — ABNORMAL HIGH (ref 11.5–15.5)
WBC: 10.8 10*3/uL — ABNORMAL HIGH (ref 4.0–10.5)
WBC: 10.8 10*3/uL — ABNORMAL HIGH (ref 4.0–10.5)
nRBC: 0 % (ref 0.0–0.2)
nRBC: 0 % (ref 0.0–0.2)

## 2020-05-04 LAB — COMPREHENSIVE METABOLIC PANEL
ALT: 218 U/L — ABNORMAL HIGH (ref 0–44)
AST: 182 U/L — ABNORMAL HIGH (ref 15–41)
Albumin: 2.1 g/dL — ABNORMAL LOW (ref 3.5–5.0)
Alkaline Phosphatase: 74 U/L (ref 38–126)
Anion gap: 9 (ref 5–15)
BUN: 27 mg/dL — ABNORMAL HIGH (ref 8–23)
CO2: 24 mmol/L (ref 22–32)
Calcium: 10.1 mg/dL (ref 8.9–10.3)
Chloride: 103 mmol/L (ref 98–111)
Creatinine, Ser: 0.93 mg/dL (ref 0.44–1.00)
GFR calc Af Amer: >60 mL/min (ref >60)
GFR calc non Af Amer: 60 mL/min — ABNORMAL LOW (ref >60)
Glucose, Bld: 150 mg/dL — ABNORMAL HIGH (ref 70–99)
Potassium: 3 mmol/L — ABNORMAL LOW (ref 3.5–5.1)
Sodium: 136 mmol/L (ref 135–145)
Total Bilirubin: 0.4 mg/dL (ref 0.3–1.2)
Total Protein: 5.5 g/dL — ABNORMAL LOW (ref 6.5–8.1)

## 2020-05-04 LAB — PROTIME-INR
INR: 1.2 (ref 0.8–1.2)
Prothrombin Time: 15 seconds (ref 11.4–15.2)

## 2020-05-04 LAB — TYPE AND SCREEN
ABO/RH(D): A POS
Antibody Screen: NEGATIVE

## 2020-05-04 LAB — APTT: aPTT: 37 seconds — ABNORMAL HIGH (ref 24–36)

## 2020-05-04 LAB — C-REACTIVE PROTEIN: CRP: 17.4 mg/dL — ABNORMAL HIGH (ref <1.0)

## 2020-05-04 MED ORDER — ENSURE PRE-SURGERY PO LIQD
296.0000 mL | Freq: Once | ORAL | Status: AC
  Administered 2020-05-04: 296 mL via ORAL
  Filled 2020-05-04: qty 296

## 2020-05-04 MED ORDER — ATORVASTATIN CALCIUM 80 MG PO TABS
80.0000 mg | ORAL_TABLET | Freq: Every day | ORAL | Status: DC
  Administered 2020-05-05 – 2020-05-15 (×10): 80 mg via ORAL
  Filled 2020-05-04 (×11): qty 1

## 2020-05-04 MED ORDER — METOPROLOL TARTRATE 25 MG PO TABS
25.0000 mg | ORAL_TABLET | Freq: Two times a day (BID) | ORAL | Status: DC
  Administered 2020-05-04 – 2020-05-15 (×15): 25 mg via ORAL
  Filled 2020-05-04 (×19): qty 1

## 2020-05-04 MED ORDER — RIVAROXABAN 20 MG PO TABS
20.0000 mg | ORAL_TABLET | Freq: Every day | ORAL | Status: DC
  Administered 2020-05-04 – 2020-05-14 (×11): 20 mg via ORAL
  Filled 2020-05-04 (×12): qty 1

## 2020-05-04 MED ORDER — POTASSIUM CHLORIDE 2 MEQ/ML IV SOLN
INTRAVENOUS | Status: DC
  Filled 2020-05-04 (×10): qty 1000

## 2020-05-04 MED ORDER — SODIUM CHLORIDE 0.9% FLUSH
3.0000 mL | Freq: Two times a day (BID) | INTRAVENOUS | Status: DC
  Administered 2020-05-04 – 2020-05-14 (×7): 3 mL via INTRAVENOUS

## 2020-05-04 MED ORDER — PRO-STAT SUGAR FREE PO LIQD
30.0000 mL | Freq: Two times a day (BID) | ORAL | Status: DC
  Administered 2020-05-04 – 2020-05-07 (×6): 30 mL via ORAL
  Filled 2020-05-04 (×6): qty 30

## 2020-05-04 MED ORDER — NITROFURANTOIN MONOHYD MACRO 100 MG PO CAPS: 100.0000 mg | ORAL_CAPSULE | Freq: Two times a day (BID) | ORAL | Status: DC

## 2020-05-04 MED ORDER — POTASSIUM CHLORIDE 2 MEQ/ML IV SOLN: INTRAVENOUS | Status: DC

## 2020-05-04 MED ORDER — ONDANSETRON HCL 4 MG PO TABS: 4.0000 mg | ORAL_TABLET | Freq: Four times a day (QID) | ORAL | Status: DC | PRN

## 2020-05-04 MED ORDER — VANCOMYCIN HCL IN DEXTROSE 1-5 GM/200ML-% IV SOLN: 1000.0000 mg | INTRAVENOUS | Status: DC

## 2020-05-04 MED ORDER — LEVOTHYROXINE SODIUM 100 MCG PO TABS
100.0000 ug | ORAL_TABLET | ORAL | Status: DC
  Administered 2020-05-07 – 2020-05-11 (×3): 100 ug via ORAL
  Filled 2020-05-04 (×3): qty 1

## 2020-05-04 MED ORDER — ASPIRIN EC 81 MG PO TBEC
81.0000 mg | DELAYED_RELEASE_TABLET | Freq: Every day | ORAL | Status: DC
  Administered 2020-05-04 – 2020-05-14 (×11): 81 mg via ORAL
  Filled 2020-05-04 (×11): qty 1

## 2020-05-04 MED ORDER — SENNOSIDES-DOCUSATE SODIUM 8.6-50 MG PO TABS: 1.0000 | ORAL_TABLET | Freq: Every evening | ORAL | Status: DC | PRN

## 2020-05-04 MED ORDER — LISINOPRIL 10 MG PO TABS
20.0000 mg | ORAL_TABLET | Freq: Every day | ORAL | Status: DC
  Administered 2020-05-05 – 2020-05-15 (×8): 20 mg via ORAL
  Filled 2020-05-04 (×11): qty 2

## 2020-05-04 MED ORDER — LEVOTHYROXINE SODIUM 100 MCG PO TABS
200.0000 ug | ORAL_TABLET | ORAL | Status: DC
  Administered 2020-05-05 – 2020-05-15 (×7): 200 ug via ORAL
  Filled 2020-05-04 (×9): qty 2

## 2020-05-04 MED ORDER — SODIUM CHLORIDE 0.9 % IV SOLN
2.0000 g | Freq: Two times a day (BID) | INTRAVENOUS | Status: DC
  Administered 2020-05-04 – 2020-05-10 (×11): 2 g via INTRAVENOUS
  Filled 2020-05-04 (×13): qty 2

## 2020-05-04 MED ORDER — ACETAMINOPHEN 325 MG PO TABS
650.0000 mg | ORAL_TABLET | Freq: Four times a day (QID) | ORAL | Status: DC | PRN
  Administered 2020-05-05 – 2020-05-13 (×7): 650 mg via ORAL
  Administered 2020-05-14 – 2020-05-15 (×2): 325 mg via ORAL
  Filled 2020-05-04 (×8): qty 2

## 2020-05-04 MED ORDER — ISOSORBIDE DINITRATE 10 MG PO TABS: 15.0000 mg | ORAL_TABLET | Freq: Every day | ORAL | Status: DC

## 2020-05-04 MED ORDER — FLUTICASONE PROPIONATE 50 MCG/ACT NA SUSP
2.0000 | Freq: Every day | NASAL | Status: DC
  Administered 2020-05-07 – 2020-05-15 (×5): 2 via NASAL
  Filled 2020-05-04: qty 16

## 2020-05-04 MED ORDER — ACETAMINOPHEN 650 MG RE SUPP: 650.0000 mg | Freq: Four times a day (QID) | RECTAL | Status: DC | PRN

## 2020-05-04 MED ORDER — SERTRALINE HCL 25 MG PO TABS
25.0000 mg | ORAL_TABLET | Freq: Every day | ORAL | Status: DC
  Administered 2020-05-04 – 2020-05-14 (×11): 25 mg via ORAL
  Filled 2020-05-04 (×11): qty 1

## 2020-05-04 MED ORDER — VANCOMYCIN HCL IN DEXTROSE 1-5 GM/200ML-% IV SOLN
1000.0000 mg | INTRAVENOUS | Status: DC
  Administered 2020-05-04 – 2020-05-06 (×3): 1000 mg via INTRAVENOUS
  Filled 2020-05-04 (×3): qty 200

## 2020-05-04 MED ORDER — ONDANSETRON HCL 4 MG/2ML IJ SOLN: 4.0000 mg | Freq: Four times a day (QID) | INTRAMUSCULAR | Status: DC | PRN

## 2020-05-04 NOTE — Progress Notes (Signed)
Patient arrived to 4E room 02 at this time as a direct admit from Dr. Orvan Seen office. Wound vac clean dry and intact. Wound site red. No drainage noted or in wound vac. Telemetry applied and CCMD notified. V/s and assessment complete. Nicholes Rough, PA notified of patient's arrival. Orders will be placed.   Emelda Fear, RN

## 2020-05-04 NOTE — Progress Notes (Signed)
[address]       [place] [address]             [phone]     CARDIOTHORACIC SURGERY OFFICE NOTE  Referring Provider is [name], MD Primary Cardiologist is Evalina Field, MD PCP is [name], MD   HPI:  79 yo lady presents for wound check; last seen Monday. Still not eating well; denies f/c Dressing being managed by home health, but she continues to have purulent drainage s/p bedside debridement at last visit.   Current Outpatient Medications  Medication Sig Dispense Refill  . acetaminophen (TYLENOL) 500 MG tablet Take 500 mg by mouth every 6 (six) hours as needed for mild pain.    Marland Kitchen aspirin EC 81 MG tablet Take 81 mg by mouth at bedtime.    Marland Kitchen atorvastatin (LIPITOR) 80 MG tablet Take 80 mg by mouth daily.     . cholecalciferol (VITAMIN D) 1000 UNITS tablet Take 1,000 Units by mouth daily at 12 noon.     . fluticasone (FLONASE) 50 MCG/ACT nasal spray Place 2 sprays into both nostrils daily.    . isosorbide mononitrate (IMDUR) 30 MG 24 hr tablet Take 0.5 tablets (15 mg total) by mouth daily.    Marland Kitchen levothyroxine (SYNTHROID, LEVOTHROID) 200 MCG tablet Take 100-200 mcg by mouth See admin instructions. Take 1/2 tablet (100 mcg) by mouth on Monday, Wednesday, Friday; take 1 tablet (200 mcg) on Sunday, Tuesday, Thursday, Saturday   DO NOT USE GENERIC    . lisinopril (ZESTRIL) 20 MG tablet Take 20 mg by mouth daily.     . nitrofurantoin, macrocrystal-monohydrate, (MACROBID) 100 MG capsule Take 100 mg by mouth 2 (two) times daily.    . Prenatal Vit-Fe Fumarate-FA (PRENATAL MULTIVITAMIN) TABS tablet Take 1 tablet by mouth daily at 12 noon.    . rivaroxaban (XARELTO) 20 MG TABS tablet Take 1 tablet (20 mg total) by mouth daily with supper. 1 tablet 0  . sertraline (ZOLOFT) 50 MG tablet Take 1 tablet (50 mg total) by mouth at bedtime. (Patient taking differently: Take 25 mg by mouth at bedtime. )    . vitamin B-12 (CYANOCOBALAMIN) 1000 MCG tablet Take  1,000 mcg by mouth daily at 12 noon.     . metoprolol tartrate (LOPRESSOR) 25 MG tablet Take 1 tablet (25 mg total) by mouth 2 (two) times daily. (Patient taking differently: Take 25 mg by mouth 2 (two) times daily after a meal. ) 180 tablet 1   No current facility-administered medications for this visit.      Physical Exam:   BP 108/63 (BP Location: Left Arm, Patient Position: Sitting, Cuff Size: Normal)   Pulse 75   Temp 98.5 F (36.9 C)   Resp 20   Ht 5\' 3" (1.6 m)   SpO2 94% Comment: RA  BMI 23.56 kg/m   General:  Ill-appearing, not toxic  Chest:   Sternal defect with purulent drainage;   CV:   rrr    Diagnostic Tests:  No new data   Impression:  Sternal infection, failed conservative/outpatient management  Plan:  Direct admit to [facility] for advanced wound care  I spent in excess of 20 minutes during the conduct of this office consultation and >50% of this time involved direct face-to-face encounter with the patient for counseling and/or coordination of their care.  Level 2                 10  minutes Level  3                 15 minutes Level 4                 25 minutes Level 5                 40 minutes  B. Murvin Natal, MD 05/04/2020 4:06 PM

## 2020-05-04 NOTE — Progress Notes (Addendum)
Pharmacy Antibiotic Note  [name] [name] is a 79 y.o. female admitted on 05/04/2020 with sternal wound infection S/P CABG (failed conservative/outpatient mgmt) as direct admit from provider's office.  Pharmacy has been consulted for cefepime and vancomycin dosing for wound Infection.  WBC 15.6, afebrile, Scr 0.96, 04/15/20 CrCl 41.2; Scr on admission 0.95 (renal function stable)  Plan: Vancomycin 1 gm IV Q 24 hrs (goal vancomycin trough: 15-20 mg/L) Cefepime 2 gm IV Q 12 hrs Discontinue nitrofurantoin while on cefepime (hx of Klebiella pneumoniae UTI in late May 2021, susceptible to cephalosporins) Monitor WBC, temp, clinical improvement, renal function, vancomycin levels as indicated  Height: 5\' 3"  (160 cm) Weight: 57.9 kg (127 lb 10.3 oz) IBW/kg (Calculated) : 52.4  Temp (24hrs), Avg:98.3 F (36.8 C), Min:98 F (36.7 C), Max:98.5 F (36.9 C)  No results for input(s): WBC, CREATININE, LATICACIDVEN, VANCOTROUGH, VANCOPEAK, VANCORANDOM, GENTTROUGH, GENTPEAK, GENTRANDOM, TOBRATROUGH, TOBRAPEAK, TOBRARND, AMIKACINPEAK, AMIKACINTROU, AMIKACIN in the last 168 hours.  Estimated Creatinine Clearance: 41.2 mL/min (by C-G formula based on SCr of 0.96 mg/dL).    Allergies  Allergen Reactions  . Bee Venom Swelling    Massive swelling  . Penicillins Other (See Comments)    ** tolerates Zosyn + cephalosporins Did it involve swelling of the face/tongue/throat, SOB, or low BP? Unknown Did it involve sudden or severe rash/hives, skin peeling, or any reaction on the inside of your mouth or nose? Unknown Did you need to seek medical attention at a hospital or doctor's office? Unknown When did it last happen?1945 If all above answers are "NO", may proceed with cephalosporin use.  . Adhesive [Tape] Rash  . Morphine And Related Rash  . Sulfa Antibiotics Nausea And Vomiting and Rash    Antimicrobials this admission: 6/18 vancomycin >> 6/18 cefepime >>  Microbiology results: No  cultures ordered during this admission   Thank you for allowing pharmacy to be a part of this patient's care.  Gillermina Hu, PharmD, BCPS, [facility] Clinical Pharmacist 05/04/2020 6:29 PM

## 2020-05-04 NOTE — Progress Notes (Signed)
Patient off unit to CT scan.

## 2020-05-05 ENCOUNTER — Inpatient Hospital Stay (HOSPITAL_COMMUNITY): Payer: PPO

## 2020-05-05 ENCOUNTER — Inpatient Hospital Stay (HOSPITAL_COMMUNITY): Payer: PPO | Admitting: Certified Registered"

## 2020-05-05 ENCOUNTER — Encounter (HOSPITAL_COMMUNITY)
Admission: AD | Disposition: A | Discharge status: Skilled Nursing Facility | Payer: Self-pay | Source: Ambulatory Visit | Attending: Cardiothoracic Surgery

## 2020-05-05 ENCOUNTER — Encounter (HOSPITAL_COMMUNITY): Payer: Self-pay | Admitting: Cardiothoracic Surgery

## 2020-05-05 HISTORY — PX: APPLICATION OF WOUND VAC: SHX5189

## 2020-05-05 HISTORY — PX: STERNAL WOUND DEBRIDEMENT: SHX1058

## 2020-05-05 LAB — BLOOD GAS, ARTERIAL
Acid-Base Excess: 0.8 mmol/L (ref 0.0–2.0)
Bicarbonate: 24.4 mmol/L (ref 20.0–28.0)
Drawn by: 345601
FIO2: 21
O2 Saturation: 96 %
Patient temperature: 37
pCO2 arterial: 35.2 mmHg (ref 32.0–48.0)
pH, Arterial: 7.455 — ABNORMAL HIGH (ref 7.350–7.450)
pO2, Arterial: 78.8 mmHg — ABNORMAL LOW (ref 83.0–108.0)

## 2020-05-05 LAB — GLUCOSE, CAPILLARY: Glucose-Capillary: 110 mg/dL — ABNORMAL HIGH (ref 70–99)

## 2020-05-05 LAB — CREATININE, SERUM
Creatinine, Ser: 0.84 mg/dL (ref 0.44–1.00)
GFR calc Af Amer: >60 mL/min (ref >60)
GFR calc non Af Amer: >60 mL/min (ref >60)

## 2020-05-05 LAB — MRSA PCR SCREENING: MRSA by PCR: NEGATIVE

## 2020-05-05 LAB — SARS CORONAVIRUS 2 BY RT PCR (HOSPITAL ORDER, PERFORMED IN ~~LOC~~ HOSPITAL LAB): SARS Coronavirus 2: NEGATIVE

## 2020-05-05 SURGERY — DEBRIDEMENT, WOUND, STERNUM
Anesthesia: Monitor Anesthesia Care | Site: Chest

## 2020-05-05 MED ORDER — MEPERIDINE HCL 25 MG/ML IJ SOLN: 6.2500 mg | INTRAMUSCULAR | Status: DC | PRN

## 2020-05-05 MED ORDER — PROPOFOL 10 MG/ML IV BOLUS
INTRAVENOUS | Status: DC | PRN
  Administered 2020-05-05: 50 mg via INTRAVENOUS
  Administered 2020-05-05: 150 mg via INTRAVENOUS

## 2020-05-05 MED ORDER — SODIUM CHLORIDE 0.9 % IR SOLN
Status: DC | PRN
  Administered 2020-05-05: 1000 mL
  Administered 2020-05-05: 2000 mL

## 2020-05-05 MED ORDER — ONDANSETRON HCL 4 MG/2ML IJ SOLN
INTRAMUSCULAR | Status: AC
  Filled 2020-05-05: qty 2

## 2020-05-05 MED ORDER — SODIUM CHLORIDE 0.9 % IV SOLN
2.0000 g | INTRAVENOUS | Status: AC
  Administered 2020-05-05: 2 g via INTRAVENOUS
  Filled 2020-05-05: qty 2

## 2020-05-05 MED ORDER — HYDROMORPHONE HCL 1 MG/ML IJ SOLN: 0.2500 mg | INTRAMUSCULAR | Status: DC | PRN

## 2020-05-05 MED ORDER — LACTATED RINGERS IV SOLN: INTRAVENOUS | Status: DC

## 2020-05-05 MED ORDER — ONDANSETRON HCL 4 MG/2ML IJ SOLN
INTRAMUSCULAR | Status: DC | PRN
  Administered 2020-05-05: 4 mg via INTRAVENOUS

## 2020-05-05 MED ORDER — LIDOCAINE 2% (20 MG/ML) 5 ML SYRINGE
INTRAMUSCULAR | Status: AC
  Filled 2020-05-05: qty 5

## 2020-05-05 MED ORDER — OXYCODONE HCL 5 MG PO TABS: 5.0000 mg | ORAL_TABLET | Freq: Once | ORAL | Status: DC | PRN

## 2020-05-05 MED ORDER — OXYCODONE HCL 5 MG/5ML PO SOLN: 5.0000 mg | Freq: Once | ORAL | Status: DC | PRN

## 2020-05-05 MED ORDER — LIDOCAINE 2% (20 MG/ML) 5 ML SYRINGE
INTRAMUSCULAR | Status: DC | PRN
  Administered 2020-05-05: 40 mg via INTRAVENOUS

## 2020-05-05 MED ORDER — EPHEDRINE SULFATE-NACL 50-0.9 MG/10ML-% IV SOSY
PREFILLED_SYRINGE | INTRAVENOUS | Status: DC | PRN
  Administered 2020-05-05 (×2): 5 mg via INTRAVENOUS

## 2020-05-05 MED ORDER — FENTANYL CITRATE (PF) 250 MCG/5ML IJ SOLN
INTRAMUSCULAR | Status: AC
  Filled 2020-05-05: qty 5

## 2020-05-05 MED ORDER — DEXAMETHASONE SODIUM PHOSPHATE 10 MG/ML IJ SOLN
INTRAMUSCULAR | Status: DC | PRN
  Administered 2020-05-05: 4 mg via INTRAVENOUS

## 2020-05-05 MED ORDER — PROMETHAZINE HCL 25 MG/ML IJ SOLN: 6.2500 mg | INTRAMUSCULAR | Status: DC | PRN

## 2020-05-05 MED ORDER — CHLORHEXIDINE GLUCONATE 0.12 % MT SOLN: 15.0000 mL | Freq: Once | OROMUCOSAL | Status: DC

## 2020-05-05 MED ORDER — PROPOFOL 10 MG/ML IV BOLUS
INTRAVENOUS | Status: AC
  Filled 2020-05-05: qty 20

## 2020-05-05 MED ORDER — PROPOFOL 1000 MG/100ML IV EMUL
INTRAVENOUS | Status: AC
  Filled 2020-05-05: qty 100

## 2020-05-05 MED ORDER — MIDAZOLAM HCL 2 MG/2ML IJ SOLN: 0.5000 mg | Freq: Once | INTRAMUSCULAR | Status: DC | PRN

## 2020-05-05 MED ORDER — FENTANYL CITRATE (PF) 100 MCG/2ML IJ SOLN
INTRAMUSCULAR | Status: DC | PRN
  Administered 2020-05-05 (×2): 50 ug via INTRAVENOUS

## 2020-05-05 MED ORDER — HEMOSTATIC AGENTS (NO CHARGE) OPTIME
TOPICAL | Status: DC | PRN
  Administered 2020-05-05: 1 via TOPICAL

## 2020-05-05 SURGICAL SUPPLY — 35 items
AGENT HMST 10 BLLW SHRT CANN (HEMOSTASIS) ×1
APL SKNCLS STERI-STRIP NONHPOA (GAUZE/BANDAGES/DRESSINGS)
ATTRACTOMAT 16X20 MAGNETIC DRP (DRAPES) ×2 IMPLANT
BENZOIN TINCTURE PRP APPL 2/3 (GAUZE/BANDAGES/DRESSINGS) IMPLANT
CANISTER SUCT 3000ML PPV (MISCELLANEOUS) ×2 IMPLANT
CLIP VESOCCLUDE MED 24/CT (CLIP) ×1 IMPLANT
CLIP VESOCCLUDE SM WIDE 24/CT (CLIP) ×1 IMPLANT
CNTNR URN SCR LID CUP LEK RST (MISCELLANEOUS) IMPLANT
CONT SPEC 4OZ STRL OR WHT (MISCELLANEOUS) ×2
DRAPE LAPAROSCOPIC ABDOMINAL (DRAPES) ×2 IMPLANT
DRSG VAC ATS MED SENSATRAC (GAUZE/BANDAGES/DRESSINGS) ×1 IMPLANT
ELECT REM PT RETURN 9FT ADLT (ELECTROSURGICAL) ×2
ELECTRODE REM PT RTRN 9FT ADLT (ELECTROSURGICAL) ×1 IMPLANT
GLOVE BIO SURGEON STRL SZ 6 (GLOVE) ×1 IMPLANT
GLOVE BIOGEL PI IND STRL 6 (GLOVE) IMPLANT
GLOVE BIOGEL PI INDICATOR 6 (GLOVE) ×2
GLOVE NEODERM STRL 7.5  LF PF (GLOVE) ×1
GLOVE NEODERM STRL 7.5 LF PF (GLOVE) IMPLANT
GLOVE SURG NEODERM 7.5  LF PF (GLOVE) ×1
GOWN STRL REUS W/ TWL LRG LVL3 (GOWN DISPOSABLE) ×4 IMPLANT
GOWN STRL REUS W/TWL LRG LVL3 (GOWN DISPOSABLE) ×8
HANDPIECE INTERPULSE COAX TIP (DISPOSABLE) ×2
HEMOSTAT HEMOBLAST BELLOWS (HEMOSTASIS) ×1 IMPLANT
KIT BASIN OR (CUSTOM PROCEDURE TRAY) ×2 IMPLANT
KIT TURNOVER KIT B (KITS) ×2 IMPLANT
NS IRRIG 1000ML POUR BTL (IV SOLUTION) ×2 IMPLANT
PACK CHEST (CUSTOM PROCEDURE TRAY) ×2 IMPLANT
PAD ARMBOARD 7.5X6 YLW CONV (MISCELLANEOUS) ×4 IMPLANT
SET HNDPC FAN SPRY TIP SCT (DISPOSABLE) IMPLANT
SOL PREP POV-IOD 4OZ 10% (MISCELLANEOUS) ×1 IMPLANT
SWAB COLLECTION DEVICE MRSA (MISCELLANEOUS) ×1 IMPLANT
SWAB CULTURE ESWAB REG 1ML (MISCELLANEOUS) ×1 IMPLANT
TOWEL GREEN STERILE (TOWEL DISPOSABLE) ×2 IMPLANT
TOWEL GREEN STERILE FF (TOWEL DISPOSABLE) ×2 IMPLANT
WATER STERILE IRR 1000ML POUR (IV SOLUTION) ×2 IMPLANT

## 2020-05-05 NOTE — Progress Notes (Signed)
Gold bracelet removed from left arm, placed in labeled clear bag, and placed in chart with holes poked into bag.

## 2020-05-05 NOTE — H&P (Signed)
History and Physical Interval Note:  05/05/2020 10:59 AM  [name] [name]  has presented today for surgery, with the diagnosis of Sternal Infection.  The various methods of treatment have been discussed with the patient and family. After consideration of risks, benefits and other options for treatment, the patient has consented to  Procedure(s): STERNAL WOUND DEBRIDEMENT (N/A) APPLICATION OF WOUND VAC (N/A) as a surgical intervention.  The patient's history has been reviewed, patient examined, no change in status, stable for surgery.  I have reviewed the patient's chart and labs.  Questions were answered to the patient's satisfaction.     [name]

## 2020-05-05 NOTE — H&P (Signed)
[name] [name]       [name],[name] [id number]             [phone]        [name] [name] [name] [name] #[id number] Date of Birth: 06/27/1941  Referring: No ref. provider found Primary Care: [name] Bowen, MD Primary Cardiologist:[name] Doreatha Lew, MD  Chief Complaint:   No chief complaint on file. sternal drainage  History of Present Illness:     79 year old lady is well-known to our service.  She underwent coronary artery bypass grafting on 01/15/6009 which was complicated by stroke 2 days after surgery.  She ultimately did well until approximately 1 month ago when she developed sternal wound drainage.  This has been managed as an outpatient with wound VAC therapy.  However she continues to feel ill and the wound continues to drain.  She is admitted to the hospital for definitive management.  She denies fevers or chills.   Current Activity/ Functional Status: Patient will be independent with mobility/ambulation, transfers, ADL's, IADL's.   Zubrod Score: At the time of surgery this patient's most appropriate activity status/level should be described as: []     0    Normal activity, no symptoms []     1    Restricted in physical strenuous activity but ambulatory, able to do out light work []     2    Ambulatory and capable of self care, unable to do work activities, up and about                 more than 50%  Of the time                            []     3    Only limited self care, in bed greater than 50% of waking hours []     4    Completely disabled, no self care, confined to bed or chair []     5    Moribund  Past Medical History:  Diagnosis Date  . Coronary artery disease   . Family history of adverse reaction to anesthesia    " My Sister did "  . HTN (hypertension)   . Hyperlipidemia   . Hypothyroidism   . Osteoporosis   . Stroke ([place])   . 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 ABDOMINAL  HYSTERECTOMY    . ANKLE SURGERY    . APPENDECTOMY    . APPLICATION OF WOUND VAC N/A 04/20/2020   Procedure: APPLICATION OF WOUND VAC;  Surgeon: [name] Olds, MD;  Location: [place];  Service: Thoracic;  Laterality: N/A;  . CARDIAC CATHETERIZATION  01/25/2010  . CHOLECYSTECTOMY    . CORONARY ARTERY BYPASS GRAFT N/A 01/27/2020   Procedure: CORONARY ARTERY BYPASS GRAFTING (CABG) x 4 using LIMA to LAD; RIMA to OM1; Left radia sequential PDA and Left PL.;  Surgeon: [name] Olds, MD;  Location: [facility];  Service: Open Heart Surgery;  Laterality: N/A;  BILATERAL IMA  . gamma knife    . IR CT HEAD LTD  01/30/2020  . IR PERCUTANEOUS ART THROMBECTOMY/INFUSION INTRACRANIAL INC DIAG ANGIO  01/30/2020  . LEFT HEART CATH AND CORONARY ANGIOGRAPHY N/A 01/26/2020   Procedure: LEFT HEART CATH AND CORONARY ANGIOGRAPHY;  Surgeon: [name] Crome, MD;  Location: [place] [facility];  Service: Cardiovascular;  Laterality: N/A;  .  RADIAL ARTERY HARVEST Left 01/27/2020   Procedure: RADIAL ARTERY HARVEST (OPEN HARVEST);  Surgeon: [name] Olds, MD;  Location: [place];  Service: Open Heart Surgery;  Laterality: Left;  . RADIOLOGY WITH ANESTHESIA N/A 01/30/2020   Procedure: IR WITH ANESTHESIA;  Surgeon: Radiologist, Medication, MD;  Location: [place];  Service: Radiology;  Laterality: N/A;  . STERNAL WOUND DEBRIDEMENT N/A 04/20/2020   Procedure: STERNAL WOUND DEBRIDEMENT;  Surgeon: [name] Olds, MD;  Location: [facility];  Service: Thoracic;  Laterality: N/A;  . TEE WITHOUT CARDIOVERSION N/A 01/27/2020   Procedure: TRANSESOPHAGEAL ECHOCARDIOGRAM (TEE);  Surgeon: [name] Olds, MD;  Location: [place];  Service: Open Heart Surgery;  Laterality: N/A;  . THYROIDECTOMY    . TOTAL HIP ARTHROPLASTY      Social History   Tobacco Use  Smoking Status Never Smoker  Smokeless Tobacco Never Used    Social History   Substance and Sexual Activity  Alcohol Use Not Currently     Allergies  Allergen Reactions  . Bee Venom  Swelling    Massive swelling  . Penicillins Other (See Comments)    ** tolerates Zosyn + cephalosporins Did it involve swelling of the face/tongue/throat, SOB, or low BP? Unknown Did it involve sudden or severe rash/hives, skin peeling, or any reaction on the inside of your mouth or nose? Unknown Did you need to seek medical attention at a hospital or doctor's office? Unknown When did it last happen?1945 If all above answers are "NO", may proceed with cephalosporin use.  . Adhesive [Tape] Rash  . Morphine And Related Rash  . Sulfa Antibiotics Nausea And Vomiting and Rash    Current Facility-Administered Medications  Medication Dose Route Frequency Provider Last Rate Last Admin  . [MAR Hold] acetaminophen (TYLENOL) tablet 650 mg  650 mg Oral Q6H PRN [name] [name], [name] N, PA-C       Or  . [MAR Hold] acetaminophen (TYLENOL) suppository 650 mg  650 mg Rectal Q6H PRN [name] Collard, PA-C      . [MAR Hold] aspirin EC tablet 81 mg  81 mg Oral QHS [name] Collard, PA-C   81 mg at 05/04/20 2123  . [MAR Hold] atorvastatin (LIPITOR) tablet 80 mg  80 mg Oral Daily [name] Collard, PA-C   80 mg at 05/05/20 0817  . [MAR Hold] ceFEPIme (MAXIPIME) 2 g in sodium chloride 0.9 % 100 mL IVPB  2 g Intravenous Q12H [name] Olds, MD 200 mL/hr at 05/04/20 2141 2 g at 05/04/20 2141  . dextrose 5% lactated ringers 1,000 mL with potassium chloride 10 mEq infusion   Intravenous Continuous [name] Olds, MD 50 mL/hr at 05/04/20 2143 New Bag at 05/04/20 2143  . [MAR Hold] feeding supplement (PRO-STAT SUGAR FREE 64) liquid 30 mL  30 mL Oral BID [name] Olds, MD   30 mL at 05/04/20 2124  . [MAR Hold] fluticasone (FLONASE) 50 MCG/ACT nasal spray 2 spray  2 spray Each Nare Daily [name], [name] N, PA-C      . [MAR Hold] levothyroxine (SYNTHROID) tablet 100 mcg  100 mcg Oral Once per day on Mon Wed Fri [name], [name] N, [name] [name]      . [MAR Hold] levothyroxine (SYNTHROID) tablet 200 mcg  200 mcg Oral Once per day on Sun  Tue Thu Sat [name] Olds, MD   200 mcg at 05/05/20 0604  . [MAR Hold] lisinopril (ZESTRIL) tablet 20 mg  20 mg Oral Daily [name] [name], [name] [name]  20 mg at 05/05/20 0816  . [MAR Hold] metoprolol tartrate (LOPRESSOR) tablet 25 mg  25 mg Oral BID PC [name], [name] N, PA-C   25 mg at 05/05/20 0816  . [MAR Hold] ondansetron (ZOFRAN) tablet 4 mg  4 mg Oral Q6H PRN [name] [name], [name] N, PA-C       Or  . [MAR Hold] ondansetron (ZOFRAN) injection 4 mg  4 mg Intravenous Q6H PRN [name] Collard, PA-C      . [MAR Hold] rivaroxaban (XARELTO) tablet 20 mg  20 mg Oral Q supper [name] Collard, PA-C   20 mg at 05/04/20 2153  . [MAR Hold] senna-docusate (Senokot-S) tablet 1 tablet  1 tablet Oral QHS PRN [name] Collard, PA-C      . [MAR Hold] sertraline (ZOLOFT) tablet 25 mg  25 mg Oral QHS [name] Collard, PA-C   25 mg at 05/04/20 2123  . [MAR Hold] sodium chloride flush (NS) 0.9 % injection 3 mL  3 mL Intravenous Q12H [name], [name] N, PA-C   3 mL at 05/04/20 2142  . vancomycin (VANCOCIN) IVPB 1000 mg/200 mL premix  1,000 mg Intravenous To SSTC Lorianna Spadaccini, Glenice Bow, MD      . Doug Sou Hold] vancomycin (VANCOCIN) IVPB 1000 mg/200 mL premix  1,000 mg Intravenous Q24H [name] Olds, MD 200 mL/hr at 05/04/20 2039 1,000 mg at 05/04/20 2039    Medications Prior to Admission  Medication Sig Dispense Refill Last Dose  . acetaminophen (TYLENOL) 500 MG tablet Take 1,000 mg by mouth See admin instructions. Take 2 tablets (1000 mg) by mouth twice daily, may also take an additional 2 tablets (1000 mg) daily as needed for back pain   05/04/2020 at am  . atorvastatin (LIPITOR) 80 MG tablet Take 80 mg by mouth daily.    05/04/2020 at am  . cholecalciferol (VITAMIN D) 1000 UNITS tablet Take 1,000 Units by mouth daily with lunch.    05/03/2020 at noon  . fluticasone (FLONASE) 50 MCG/ACT nasal spray Place 2 sprays into both nostrils daily.   05/04/2020 at am  . isosorbide dinitrate (ISORDIL) 30 MG tablet Take 15 mg by mouth daily.   05/04/2020 at  am  . levothyroxine (SYNTHROID, LEVOTHROID) 200 MCG tablet Take 100-200 mcg by mouth See admin instructions. Take 1/2 tablet (100 mcg) by mouth on Monday, Wednesday, Friday mornings; take 1 tablet (200 mcg) on Sunday, Tuesday, Thursday, Saturday mornings  DO NOT USE GENERIC   05/04/2020 at am  . lisinopril (ZESTRIL) 20 MG tablet Take 20 mg by mouth daily.    05/04/2020 at am  . metoprolol tartrate (LOPRESSOR) 25 MG tablet Take 1 tablet (25 mg total) by mouth 2 (two) times daily. (Patient taking differently: Take 25 mg by mouth 2 (two) times daily with a meal. ) 180 tablet 1 05/04/2020 at 800  . Prenatal Vit-Fe Fumarate-FA (PRENATAL MULTIVITAMIN) TABS tablet Take 1 tablet by mouth daily with lunch.    05/03/2020 at noon  . rivaroxaban (XARELTO) 20 MG TABS tablet Take 1 tablet (20 mg total) by mouth daily with supper. (Patient taking differently: Take 20 mg by mouth at bedtime. ) 1 tablet 0 05/03/2020 at 2100-2200  . sertraline (ZOLOFT) 50 MG tablet Take 1 tablet (50 mg total) by mouth at bedtime. (Patient taking differently: Take 25 mg by mouth at bedtime. )   05/03/2020 at pm  . vitamin B-12 (CYANOCOBALAMIN) 1000 MCG tablet Take 1,000 mcg by mouth daily with lunch.    05/03/2020 at noon  .  aspirin EC 81 MG tablet Take 81 mg by mouth at bedtime.   04/30/2020  . nitrofurantoin, macrocrystal-monohydrate, (MACROBID) 100 MG capsule Take 100 mg by mouth 2 (two) times daily.   not yet taken    Family History  Problem Relation Age of Onset  . Breast cancer Mother 52  . Hypertension Mother   . Diabetes Mother   . Heart disease Mother   . Cirrhosis Father   . Cancer Brother   . Heart disease Brother   . Heart attack Sister      Review of Systems:   ROS A comprehensive review of systems was negative.     Cardiac Review of Systems: Y or  [    ]= no  Chest Pain [    ]  Resting SOB [   ] Exertional SOB  [  ]  Orthopnea [  ]   Pedal Edema [   ]    Palpitations [  ] Syncope  [  ]   Presyncope [    ]  General Review of Systems: [Y] = yes [  ]=no Constitional: recent weight change [  ]; anorexia [  ]; fatigue [  ]; nausea [  ]; night sweats [  ]; fever [  ]; or chills [  ]                                                               Dental: Last Dentist visit:   Eye : blurred vision [  ]; diplopia [   ]; vision changes [  ];  Amaurosis fugax[  ]; Resp: cough [  ];  wheezing[  ];  hemoptysis[  ]; shortness of breath[  ]; paroxysmal nocturnal dyspnea[  ]; dyspnea on exertion[  ]; or orthopnea[  ];  GI:  gallstones[  ], vomiting[  ];  dysphagia[  ]; melena[  ];  hematochezia [  ]; heartburn[  ];   Hx of  Colonoscopy[  ]; GU: kidney stones [  ]; hematuria[  ];   dysuria [  ];  nocturia[  ];  history of     obstruction [  ]; urinary frequency [  ]             Skin: rash, swelling[  ];, hair loss[  ];  peripheral edema[  ];  or itching[  ]; Musculosketetal: myalgias[  ];  joint swelling[  ];  joint erythema[  ];  joint pain[  ];  back pain[  ];  Heme/Lymph: bruising[  ];  bleeding[  ];  anemia[  ];  Neuro: TIA[  ];  headaches[  ];  stroke[  ];  vertigo[  ];  seizures[  ];   paresthesias[  ];  difficulty walking[  ];  Psych:depression[  ]; anxiety[  ];  Endocrine: diabetes[  ];  thyroid dysfunction[  ];        Physical Exam: BP (!) 142/68 (BP Location: Right Arm)   Pulse 75   Temp 98.2 F (36.8 C) (Oral)   Resp 19   Ht 5\' 3"  (1.6 m)   Wt 57.4 kg   SpO2 95%   BMI 22.42 kg/m    General appearance: alert and cooperative Head: Normocephalic, without obvious abnormality, atraumatic Neck: no adenopathy, no  carotid bruit, no JVD, supple, symmetrical, trachea midline and thyroid not enlarged, symmetric, no tenderness/mass/nodules Resp: clear to auscultation bilaterally Cardio: regular rate and rhythm, S1, S2 normal, no murmur, click, rub or gallop GI: soft, non-tender; bowel sounds normal; no masses,  no organomegaly Extremities: edema Mild Neurologic: Alert and oriented X 3, normal  strength and tone. Normal symmetric reflexes. Normal coordination and gait   The inferior sternal wound defect has fibrinous debris and purulent drainage.   Diagnostic Studies & Laboratory data:     Recent Radiology Findings:   CT CHEST WO CONTRAST  Result Date: 05/04/2020 CLINICAL DATA:  Mediastinitis Per electronic records, patient is post sternal wound debridement 04/20/2020. EXAM: CT CHEST WITHOUT CONTRAST TECHNIQUE: Multidetector CT imaging of the chest was performed following the standard protocol without IV contrast. COMPARISON:  CT 04/17/2020 FINDINGS: Cardiovascular: Aortic atherosclerosis. Dense calcification of native coronary arteries post CABG. Epicardial leads are present. Retrosternal collection which will be described below extends into the pericardial space anteriorly. Mediastinum/Nodes: Ill-defined retrosternal anterior mediastinal collection of ill-defined fluid and gas. Collection extends retrosternal from the manubrium to just above the xiphoid process spanning 12 cm in length. This is up to 1 cm in thickness. Transverse dimension of 5.6 cm in the midportion, 2.8 cm superiorly. Inferior extent abuts the anterior pericardium adjacent to the right ventricle and adjacent epicardial leads. Overall size of the collection has diminished slightly from preoperative CT 04/17/2020. There is a wound VAC in place in the anterior subcutaneous tissues with small amount of ill-defined fluid and air. Subcutaneous changes extend to the manubrium superiorly with skin and soft tissue defect in the lower aspect in the region of wound VAC. Small inferior fluid collection measures 2 x 1.9 cm, series 3, image 110. Prominent lower paratracheal node measuring 8 mm. Additional small mediastinal lymph nodes are not enlarged by size criteria. Post thyroidectomy. Decompressed esophagus. Lungs/Pleura: Scattered atelectasis. No significant pleural effusion. There is no pulmonary edema. Upper Abdomen: No upper  abdominal ascites. No acute findings. Atherosclerosis of upper abdominal vasculature. Musculoskeletal: Prior median sternotomy with nonunion. Osseous gap in the manubrium is 5 mm, approximately 2-3 mm in the lower sternal body. There is no definite bony destruction, however lucency is well defined. No periosteal reaction involving the adjacent ribs. Scoliosis and degenerative change in the spine. IMPRESSION: 1. Ill-defined retrosternal anterior mediastinal collection of fluid and gas, as described. Overall size of the collection has diminished slightly from preoperative CT 04/17/2020. Inferior extent abuts the anterior pericardium adjacent to the right ventricle and adjacent epicardial leads. 2. Anterior wound VAC in place with small amount of ill-defined fluid and air in the anterior parasternal subcutaneous tissues. Small inferior fluid collection measures 2 x 1.9 cm. 3. Prior median sternotomy with nonunion. Aortic Atherosclerosis (ICD10-I70.0). Electronically Signed   By: Keith Rake M.D.   On: 05/04/2020 19:21     I have independently reviewed the above radiologic studies and discussed with the patient   Recent Lab Findings: Lab Results  Component Value Date   WBC 10.8 (H) 05/04/2020   HGB 9.4 (L) 05/04/2020   HCT 29.9 (L) 05/04/2020   PLT 376 05/04/2020   GLUCOSE 150 (H) 05/04/2020   CHOL 81 01/31/2020   TRIG 90 01/31/2020   HDL 35 (L) 01/31/2020   LDLCALC 28 01/31/2020   ALT 218 (H) 05/04/2020   AST 182 (H) 05/04/2020   NA 136 05/04/2020   K 3.0 (L) 05/04/2020   CL 103 05/04/2020   CREATININE 0.84 05/05/2020  BUN 27 (H) 05/04/2020   CO2 24 05/04/2020   INR 1.2 05/04/2020   HGBA1C 5.1 01/27/2020      Assessment / Plan:       Admit to the hospital for advanced wound care, nutritional support, and antibiotics. Plan operative debridement on 05/05/2020   I  spent 30 minutes counseling the patient face to face.   Vondell Sowell Z. Orvan Seen, [place] 05/05/2020 10:03  AM

## 2020-05-05 NOTE — Op Note (Signed)
Procedure(s): STERNAL WOUND DEBRIDEMENT APPLICATION OF WOUND VAC Procedure Note  Billie-Lynn D [place]erico female 79 y.o. 05/05/2020  Procedure(s) and Anesthesia Type:    * STERNAL WOUND DEBRIDEMENT - General    * APPLICATION OF WOUND VAC - General  Surgeon(s) and Role:    * [name], MD - Primary   Indications: The patient was admitted to the hospital with sternal infection; she is taken to the OR for debridement and vac change Surgeon: [name]   Assistants: staff  Anesthesia: General endotracheal anesthesia  ASA Class: 4    Procedure Detail  STERNAL WOUND DEBRIDEMENT, [place] After informed consent, the patient was taken to the OR on the above date and placed supine on the OR table. Anesthesia was begun in a smooth fashion by GET. The anterior chest was cleansed and draped with betadine. A preoperative pause was performed.   The wound was opened fully with electrocautery. Residual sternal hardware was removed. Copious irrigation was performed. Cultures for gram stain and c/s were obtained. A vac sponge was fit to fill  the defect and was covered by vac drape. This concluded the procedure.   Estimated Blood Loss:  less than 50 mL         Drains: no   Blood Given: none          Specimens: sternal debris         Implants: none        Complications:  * No complications entered in OR log *         Disposition: PACU - hemodynamically stable.         Condition: stable

## 2020-05-05 NOTE — Anesthesia Preprocedure Evaluation (Addendum)
Anesthesia Evaluation  Patient identified by MRN, date of birth, ID band Patient awake    Reviewed: Allergy & Precautions, NPO status , Patient's Chart, lab work & pertinent test results, reviewed documented beta blocker date and time   History of Anesthesia Complications Negative for: history of anesthetic complications  Airway Mallampati: II  TM Distance: >3 FB Neck ROM: Full    Dental  (+) Dental Advisory Given   Pulmonary neg pulmonary ROS,  05/05/2020 SARS coronavirus NEG   breath sounds clear to auscultation       Cardiovascular hypertension, Pt. on medications and Pt. on home beta blockers (-) angina+ CAD and + CABG (01/2020)   Rhythm:Regular Rate:Normal  01/2020 ECHO: EF 60-65%. The eft ventricle has normal function, without regional wall motion abnormalities.  Right ventricular systolic function is mildly reduced. The right ventricular size is mildly enlarged.  MV is mildly thickened. Trivial mitral valve regurgitation.    Neuro/Psych CVA (2d post op CABG), Residual Symptoms    GI/Hepatic negative GI ROS, Elevated LFTs   Endo/Other  Hypothyroidism   Renal/GU negative Renal ROS     Musculoskeletal   Abdominal   Peds  Hematology  (+) Blood dyscrasia (Hb 9.4), anemia ,   Anesthesia Other Findings   Reproductive/Obstetrics                           Anesthesia Physical Anesthesia Plan  ASA: III  Anesthesia Plan: MAC   Post-op Pain Management:    Induction: Intravenous  PONV Risk Score and Plan: 2 and Ondansetron and Dexamethasone  Airway Management Planned: Simple Face Mask and Natural Airway  Additional Equipment: None  Intra-op Plan:   Post-operative Plan:   Informed Consent: I have reviewed the patients History and Physical, chart, labs and discussed the procedure including the risks, benefits and alternatives for the proposed anesthesia with the patient or authorized  representative who has indicated his/her understanding and acceptance.     Dental advisory given  Plan Discussed with: CRNA and Surgeon  Anesthesia Plan Comments:        Anesthesia Quick Evaluation

## 2020-05-05 NOTE — Anesthesia Procedure Notes (Signed)
Procedure Name: LMA Insertion Date/Time: 05/05/2020 11:17 AM Performed by: Lowella Dell, CRNA Pre-anesthesia Checklist: Patient identified, Emergency Drugs available, Suction available and Patient being monitored Patient Re-evaluated:Patient Re-evaluated prior to induction Oxygen Delivery Method: Circle System Utilized Preoxygenation: Pre-oxygenation with 100% oxygen Induction Type: IV induction Ventilation: Mask ventilation without difficulty LMA: LMA inserted LMA Size: 4.0 Number of attempts: 1 Airway Equipment and Method: Bite block Placement Confirmation: positive ETCO2 Tube secured with: Tape Dental Injury: Teeth and Oropharynx as per pre-operative assessment

## 2020-05-05 NOTE — Transfer of Care (Signed)
Immediate Anesthesia Transfer of Care Note  Patient: [name] [name]  Procedure(s) Performed: STERNAL WOUND DEBRIDEMENT (N/A Chest) APPLICATION OF WOUND VAC (N/A Chest)  Patient Location: PACU  Anesthesia Type:General  Level of Consciousness: drowsy and patient cooperative  Airway & Oxygen Therapy: Patient Spontanous Breathing and Patient connected to face mask oxygen  Post-op Assessment: Report given to RN and Post -op Vital signs reviewed and stable  Post vital signs: Reviewed and stable  Last Vitals:  Vitals Value Taken Time  BP 126/44 05/05/20 1223  Temp    Pulse 70 05/05/20 1226  Resp 24 05/05/20 1226  SpO2 94 % 05/05/20 1226  Vitals shown include unvalidated device data.  Last Pain:  Vitals:   05/05/20 0800  TempSrc:   PainSc: 0-No pain         Complications: No complications documented.

## 2020-05-05 NOTE — Progress Notes (Signed)
Pt arrived back to 4e from PACU. Sternal incision has wound vac in place. About 50 mL sanguinous output noted. Pt resting comfortably. Vitals obtained.

## 2020-05-05 NOTE — Anesthesia Postprocedure Evaluation (Signed)
Anesthesia Post Note  Patient: Billie-Lynn D Farquhar  Procedure(s) Performed: STERNAL WOUND DEBRIDEMENT (N/A Chest) APPLICATION OF WOUND VAC (N/A Chest)     Patient location during evaluation: PACU Anesthesia Type: MAC Level of consciousness: sedated, oriented and patient cooperative Pain management: pain level controlled Vital Signs Assessment: post-procedure vital signs reviewed and stable Respiratory status: spontaneous breathing, nonlabored ventilation, respiratory function stable and patient connected to nasal cannula oxygen Cardiovascular status: blood pressure returned to baseline and stable Postop Assessment: no apparent nausea or vomiting Anesthetic complications: no   No complications documented.  Last Vitals:  Vitals:   05/05/20 1240 05/05/20 1250  BP: (!) 127/49 (!) 122/59  Pulse: 68 69  Resp: 19   Temp:  36.5 C  SpO2: 93% 93%    Last Pain:  Vitals:   05/05/20 1250  TempSrc:   PainSc: 0-No pain                 Shalla Bulluck,E. Assad Harbeson

## 2020-05-05 NOTE — Plan of Care (Signed)
POC initiated and developping

## 2020-05-06 ENCOUNTER — Encounter (HOSPITAL_COMMUNITY): Payer: Self-pay | Admitting: Cardiothoracic Surgery

## 2020-05-06 LAB — CBC
HCT: 29.9 % — ABNORMAL LOW (ref 36.0–46.0)
Hemoglobin: 9.3 g/dL — ABNORMAL LOW (ref 12.0–15.0)
MCH: 26.1 pg (ref 26.0–34.0)
MCHC: 31.1 g/dL (ref 30.0–36.0)
MCV: 84 fL (ref 80.0–100.0)
Platelets: 417 10*3/uL — ABNORMAL HIGH (ref 150–400)
RBC: 3.56 MIL/uL — ABNORMAL LOW (ref 3.87–5.11)
RDW: 15.8 % — ABNORMAL HIGH (ref 11.5–15.5)
WBC: 11.9 10*3/uL — ABNORMAL HIGH (ref 4.0–10.5)
nRBC: 0 % (ref 0.0–0.2)

## 2020-05-06 LAB — BASIC METABOLIC PANEL
Anion gap: 13 (ref 5–15)
BUN: 25 mg/dL — ABNORMAL HIGH (ref 8–23)
CO2: 16 mmol/L — ABNORMAL LOW (ref 22–32)
Calcium: 9.8 mg/dL (ref 8.9–10.3)
Chloride: 110 mmol/L (ref 98–111)
Creatinine, Ser: 0.56 mg/dL (ref 0.44–1.00)
GFR calc Af Amer: >60 mL/min (ref >60)
GFR calc non Af Amer: >60 mL/min (ref >60)
Glucose, Bld: 116 mg/dL — ABNORMAL HIGH (ref 70–99)
Potassium: 5 mmol/L (ref 3.5–5.1)
Sodium: 139 mmol/L (ref 135–145)

## 2020-05-06 LAB — C-REACTIVE PROTEIN: CRP: 7.9 mg/dL — ABNORMAL HIGH (ref <1.0)

## 2020-05-06 LAB — GLUCOSE, CAPILLARY: Glucose-Capillary: 140 mg/dL — ABNORMAL HIGH (ref 70–99)

## 2020-05-06 MED ORDER — SODIUM CHLORIDE 0.9% FLUSH
10.0000 mL | Freq: Two times a day (BID) | INTRAVENOUS | Status: DC
  Administered 2020-05-06 – 2020-05-13 (×5): 10 mL

## 2020-05-06 MED ORDER — SODIUM CHLORIDE 0.9% FLUSH: 10.0000 mL | INTRAVENOUS | Status: DC | PRN

## 2020-05-06 NOTE — Progress Notes (Addendum)
      [address]       [place] [address]             [phone]      1 Day Post-Op Procedure(s) (LRB): STERNAL WOUND DEBRIDEMENT (N/A) APPLICATION OF WOUND VAC (N/A) Subjective: She is sleepy this morning.   Objective: Vital signs in last 24 hours: Temp:  [97.7 F (36.5 C)-97.9 F (36.6 C)] 97.8 F (36.6 C) (06/20 0424) Pulse Rate:  [56-70] 67 (06/20 0424) Cardiac Rhythm: Normal sinus rhythm (06/20 0721) Resp:  [13-19] 13 (06/20 0424) BP: (106-132)/(35-59) 120/51 (06/20 0424) SpO2:  [92 %-98 %] 98 % (06/20 0424)     Intake/Output from previous day: 06/19 0701 - 06/20 0700 In: 1483.9 [I.V.:1183.9; IV Piggyback:300] Out: 440 [Urine:250; Drains:170; Blood:20] Intake/Output this shift: No intake/output data recorded.  General appearance: alert, cooperative and no distress Heart: regular rate and rhythm, S1, S2 normal, no murmur, click, rub or gallop Lungs: clear to auscultation bilaterally Abdomen: soft, non-tender; bowel sounds normal; no masses,  no organomegaly Extremities: extremities normal, atraumatic, no cyanosis or edema Wound: wound vac with good suction  Lab Results: Recent Labs    05/04/20 2037 05/04/20 2237  WBC 10.8* 10.8*  HGB 10.8* 9.4*  HCT 33.9* 29.9*  PLT 423* 376   BMET:  Recent Labs    05/04/20 2037 05/04/20 2037 05/04/20 2237 05/05/20 0247  NA 138  --  136  --   K 3.7  --  3.0*  --   CL 103  --  103  --   CO2 26  --  24  --   GLUCOSE 121*  --  150*  --   BUN 30*  --  27*  --   CREATININE 0.95   < > 0.93 0.84  CALCIUM 11.0*  --  10.1  --    < > = values in this interval not displayed.    PT/INR:  Recent Labs    05/04/20 2237  LABPROT 15.0  INR 1.2   ABG    Component Value Date/Time   PHART 7.455 (H) 05/05/2020 0018   HCO3 24.4 05/05/2020 0018   TCO2 30 02/02/2020 1735   ACIDBASEDEF 2.0 01/28/2020 1545   O2SAT 96.0 05/05/2020 0018   CBG (last 3)  Recent Labs    05/05/20 0614 05/06/20 0605    GLUCAP 110* 140*    Assessment/Plan: S/P Procedure(s) (LRB): STERNAL WOUND DEBRIDEMENT (N/A) APPLICATION OF WOUND VAC (N/A)  1. CV-NSR in the 60s, BP well controlled. Continue xarelto/asa81 2. Pulm- tolerating room air with good oxygen saturation 3. Renal-creatinine 0.93, she was hypokalemic, but it appears she got some fluid with potassium. Will order a BMP 4. H and H 9.4/29.9, no labs this morning-will order a cbc 5. Endo-blood glucose well controlled 6. ID-Maxipime IV for wound infection.  7. Hypothyroidism-continue home dose synthroid   Plan: continue IV antibiotics. Encouraged ambulation and use of incentive spirometer. Will order PT since she had reportedly fallen a lot at home.    LOS: 2 days    [name] 05/06/2020   Chart reviewed, patient examined, agree with above. Complains of pain. VAC functioning appropriately. Labs ok this am. K+ is 5.0.

## 2020-05-06 NOTE — Evaluation (Signed)
Physical Therapy Evaluation Patient Details Name: [name] [name] MRN: [id number] DOB: 1940-11-20 Today's Date: 05/06/2020   History of Present Illness  79 y.o. female was brought to ED, demonstrating chest pain over last few days.  Not worsened by exertion but is reporting being less able to move her legs now.  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 ETT 3/12-3/13 (for procedure), 3/15-3/19. 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  Clinical Impression  Pt admitted with/for management of sternal infection.  She is not at baseline functioning and is needing min to mod assist for basic mobility..  Pt currently limited functionally due to the problems listed. ( See problems list.)   Pt will benefit from PT to maximize function and safety in order to get ready for next venue listed below.     Follow Up Recommendations Supervision/Assistance - 24 hour;SNF;Other (comment) (pt has recently declined post acute rehab in favor of home)    Equipment Recommendations  None recommended by PT    Recommendations for Other Services       Precautions / Restrictions Precautions Precautions: Fall      Mobility  Bed Mobility Overal bed mobility: Needs Assistance Bed Mobility: Rolling;Sidelying to Sit;Sit to Sidelying Rolling: Min assist Sidelying to sit: Min assist     Sit to sidelying: Mod assist General bed mobility comments: pt helped choose options to transition, cued for best technique and needed truncal assist  Transfers Overall transfer level: Needs assistance Equipment used: None Transfers: Sit to/from Stand Sit to Stand: Mod assist            Ambulation/Gait Ambulation/Gait assistance: Min assist Gait Distance (Feet): 2 Feet (sidestepping)     Gait velocity: reduced   General Gait Details: face to face assist to sidestepto [facility]  Stairs            Wheelchair Mobility    Modified  Rankin (Stroke Patients Only)       Balance Overall balance assessment: Needs assistance   Sitting balance-Leahy Scale: Fair     Standing balance support: No upper extremity supported;During functional activity Standing balance-Leahy Scale: Poor Standing balance comment: reliant on external support today                             Pertinent Vitals/Pain Pain Assessment: Faces Faces Pain Scale: Hurts little more Pain Location: L arm iv site, sternum Pain Descriptors / Indicators: Discomfort Pain Intervention(s): Monitored during session    Home Living Family/patient expects to be discharged to:: Private residence Living Arrangements: Alone Available Help at Discharge: Family;Available PRN/intermittently Type of Home: House Home Access: Stairs to enter Entrance Stairs-Rails: Right Entrance Stairs-Number of Steps: 3-4 Home Layout: One level Home Equipment: Walker - 4 wheels;Cane - single point;Shower seat Additional Comments: pt focused on home    Prior Function Level of Independence: Independent with assistive device(s);Independent         Comments: uses rollator and SPC as needed     Hand Dominance   Dominant Hand: Right    Extremity/Trunk Assessment   Upper Extremity Assessment Upper Extremity Assessment: Defer to OT evaluation    Lower Extremity Assessment Lower Extremity Assessment: Generalized weakness;Overall [facility] for tasks assessed    Cervical / Trunk Assessment Cervical / Trunk Assessment: Kyphotic  Communication   Communication: HOH  Cognition Arousal/Alertness: Awake/alert Behavior During Therapy: Flat affect Overall Cognitive Status:  No family/caregiver present to determine baseline cognitive functioning                                        General Comments General comments (skin integrity, edema, etc.): vss    Exercises     Assessment/Plan    PT Assessment Patient needs continued PT services  PT Problem  List Decreased strength;Decreased activity tolerance;Decreased balance;Decreased mobility;Decreased knowledge of use of DME;Cardiopulmonary status limiting activity;Pain       PT Treatment Interventions DME instruction;Gait training;Stair training;Functional mobility training;Therapeutic activities;Therapeutic exercise;Balance training;Patient/family education    PT Goals (Current goals can be found in the Care Plan section)  Acute Rehab PT Goals Patient Stated Goal: pt focused on pain in arm, not interested in discharge yet. PT Goal Formulation: Patient unable to participate in goal setting Time For Goal Achievement: 05/20/20 Potential to Achieve Goals: Good    Frequency Min 3X/week   Barriers to discharge Other (comment) (friends only)      Co-evaluation               AM-PAC PT "6 Clicks" Mobility  Outcome Measure Help needed turning from your back to your side while in a flat bed without using bedrails?: A Little Help needed moving from lying on your back to sitting on the side of a flat bed without using bedrails?: A Lot Help needed moving to and from a bed to a chair (including a wheelchair)?: A Lot Help needed standing up from a chair using your arms (e.g., wheelchair or bedside chair)?: A Lot Help needed to walk in hospital room?: A Little Help needed climbing 3-5 steps with a railing? : A Lot 6 Click Score: 14    End of Session   Activity Tolerance: Patient tolerated treatment well;Patient limited by fatigue Patient left: in bed;with call bell/phone within reach Nurse Communication: Mobility status PT Visit Diagnosis: Unsteadiness on feet (R26.81);Other abnormalities of gait and mobility (R26.89);Muscle weakness (generalized) (M62.81);Pain Pain - part of body:  (sternal)    Time: 0539-7673 PT Time Calculation (min) (ACUTE ONLY): 34 min   Charges:   PT Evaluation $PT Eval Moderate Complexity: 1 Mod PT Treatments $Therapeutic Activity: 8-22 mins         05/06/2020  Ginger Carne., PT Acute Rehabilitation Services [phone]  (pager) [phone]  (office)  Tessie Fass Lakshya Mcgillicuddy 05/06/2020, 6:06 PM

## 2020-05-07 ENCOUNTER — Inpatient Hospital Stay: Payer: Self-pay

## 2020-05-07 LAB — GLUCOSE, CAPILLARY: Glucose-Capillary: 100 mg/dL — ABNORMAL HIGH (ref 70–99)

## 2020-05-07 MED ORDER — SODIUM CHLORIDE 0.9% FLUSH
10.0000 mL | Freq: Two times a day (BID) | INTRAVENOUS | Status: DC
  Administered 2020-05-08 – 2020-05-14 (×10): 10 mL

## 2020-05-07 MED ORDER — VANCOMYCIN HCL 500 MG/100ML IV SOLN
500.0000 mg | Freq: Two times a day (BID) | INTRAVENOUS | Status: DC
  Administered 2020-05-07 – 2020-05-14 (×15): 500 mg via INTRAVENOUS
  Filled 2020-05-07 (×16): qty 100

## 2020-05-07 MED ORDER — IBUPROFEN 400 MG PO TABS
400.0000 mg | ORAL_TABLET | Freq: Three times a day (TID) | ORAL | Status: AC
  Administered 2020-05-07 – 2020-05-08 (×6): 400 mg via ORAL
  Filled 2020-05-07 (×6): qty 1

## 2020-05-07 MED ORDER — SODIUM CHLORIDE 0.9% FLUSH
10.0000 mL | INTRAVENOUS | Status: DC | PRN
  Administered 2020-05-09: 10 mL

## 2020-05-07 MED ORDER — CHLORHEXIDINE GLUCONATE CLOTH 2 % EX PADS
6.0000 | MEDICATED_PAD | Freq: Every day | CUTANEOUS | Status: DC
  Administered 2020-05-08 – 2020-05-15 (×9): 6 via TOPICAL

## 2020-05-07 NOTE — Progress Notes (Signed)
Pharmacy Antibiotic Note  [name] [name] is a 79 y.o. female admitted on 05/04/2020 with sternal wound infection S/P CABG (failed conservative/outpatient mgmt) as direct admit from provider's office.  Pharmacy has been consulted for cefepime and vancomycin dosing for wound Infection.  Now s/p debridement + wound vac (6/19), plans for OR and vac change 6/22.  Day #4 abx, renal function stable, BMP for q 48h.  Plan: Adjust vancomycin to 500 mg IV every 12 hours Cefepime 2g IV every 12 hours Monitor renal function, Cx and clinical progression to narrow Vancomycin trough at steady state   Height: 5\' 3"  (160 cm) Weight: 57.4 kg (126 lb 8.7 oz) IBW/kg (Calculated) : 52.4  Temp (24hrs), Avg:97.8 F (36.6 C), Min:97.5 F (36.4 C), Max:98.2 F (36.8 C)  Recent Labs  Lab 05/04/20 2037 05/04/20 2237 05/05/20 0247 05/06/20 0738 05/06/20 1054  WBC 10.8* 10.8*  --   --  11.9*  CREATININE 0.95 0.93 0.84 0.56  --     Estimated Creatinine Clearance: 49.5 mL/min (by C-G formula based on SCr of 0.56 mg/dL).    Allergies  Allergen Reactions   Bee Venom Swelling    Massive swelling   Penicillins Other (See Comments)    ** tolerates Zosyn + cephalosporins Did it involve swelling of the face/tongue/throat, SOB, or low BP? Unknown Did it involve sudden or severe rash/hives, skin peeling, or any reaction on the inside of your mouth or nose? Unknown Did you need to seek medical attention at a hospital or doctor's office? Unknown When did it last happen?1945 If all above answers are "NO", may proceed with cephalosporin use.   Adhesive [Tape] Rash   Morphine And Related Rash   Sulfa Antibiotics Nausea And Vomiting and Rash    Antimicrobials this admission: 6/18 vancomycin >> 6/18 cefepime >>  Microbiology results: Wound cx 6/19 >> few staph aureus Wound cx 6/19 >> few staph aureus, rare pseudomonas MRSA PCR neg  Bertis Ruddy, PharmD Clinical Pharmacist Please check  AMION for all [place] numbers 05/07/2020 8:35 AM

## 2020-05-07 NOTE — Progress Notes (Signed)
2 Days Post-Op Procedure(s) (LRB): STERNAL WOUND DEBRIDEMENT (N/A) APPLICATION OF WOUND VAC (N/A) Subjective: Feeling better  Objective: Vital signs in last 24 hours: Temp:  [97.5 F (36.4 C)-98.2 F (36.8 C)] 97.9 F (36.6 C) (06/21 0744) Pulse Rate:  [52-64] 64 (06/21 0744) Cardiac Rhythm: Sinus bradycardia (06/21 0732) Resp:  [16-21] 16 (06/21 0744) BP: (105-144)/(42-56) 143/50 (06/21 0744) SpO2:  [96 %-97 %] 97 % (06/21 0744)  Hemodynamic parameters for last 24 hours:    Intake/Output from previous day: 06/20 0701 - 06/21 0700 In: 303 [I.V.:3; IV Piggyback:300] Out: 75 [Drains:75] Intake/Output this shift: No intake/output data recorded.  General appearance: alert and cooperative Neurologic: intact Heart: regular rate and rhythm, S1, S2 normal, no murmur, click, rub or gallop Wound: dressing intact  Lab Results: Recent Labs    05/04/20 2237 05/06/20 1054  WBC 10.8* 11.9*  HGB 9.4* 9.3*  HCT 29.9* 29.9*  PLT 376 417*   BMET:  Recent Labs    05/04/20 2237 05/04/20 2237 05/05/20 0247 05/06/20 0738  NA 136  --   --  139  K 3.0*  --   --  5.0  CL 103  --   --  110  CO2 24  --   --  16*  GLUCOSE 150*  --   --  116*  BUN 27*  --   --  25*  CREATININE 0.93   < > 0.84 0.56  CALCIUM 10.1  --   --  9.8   < > = values in this interval not displayed.    PT/INR:  Recent Labs    05/04/20 2237  LABPROT 15.0  INR 1.2   ABG    Component Value Date/Time   PHART 7.455 (H) 05/05/2020 0018   HCO3 24.4 05/05/2020 0018   TCO2 30 02/02/2020 1735   ACIDBASEDEF 2.0 01/28/2020 1545   O2SAT 96.0 05/05/2020 0018   CBG (last 3)  Recent Labs    05/05/20 0614 05/06/20 0605 05/07/20 0609  GLUCAP 110* 140* 100*    Assessment/Plan: S/P Procedure(s) (LRB): STERNAL WOUND DEBRIDEMENT (N/A) APPLICATION OF WOUND VAC (N/A) Mobilize OR tomorrow for wound exam and vac change  PT/OT/CIR Nutrition consult   LOS: 3 days    [name] 05/07/2020

## 2020-05-07 NOTE — Progress Notes (Signed)
Inpatient Rehab Admissions:  Inpatient Rehab Consult received.  I met with patient at the bedside for rehabilitation assessment and to discuss goals and expectations of an inpatient rehab admission.  She is open to CIR, if insurance will approve.  Will need OT evaluation in order to open insurance for prior authorization.    Signed: Shann Medal, PT, DPT Admissions Coordinator [phone] 05/07/20  3:00 PM

## 2020-05-08 ENCOUNTER — Encounter (HOSPITAL_COMMUNITY)
Admission: AD | Disposition: A | Discharge status: Skilled Nursing Facility | Payer: Self-pay | Source: Ambulatory Visit | Attending: Cardiothoracic Surgery

## 2020-05-08 ENCOUNTER — Inpatient Hospital Stay (HOSPITAL_COMMUNITY): Payer: PPO | Admitting: Anesthesiology

## 2020-05-08 ENCOUNTER — Encounter (HOSPITAL_COMMUNITY): Payer: Self-pay | Admitting: Cardiothoracic Surgery

## 2020-05-08 DIAGNOSIS — T8131XD Disruption of external operation (surgical) wound, not elsewhere classified, subsequent encounter: Secondary | ICD-10-CM

## 2020-05-08 HISTORY — PX: STERNAL WOUND DEBRIDEMENT: SHX1058

## 2020-05-08 LAB — BASIC METABOLIC PANEL
Anion gap: 8 (ref 5–15)
BUN: 25 mg/dL — ABNORMAL HIGH (ref 8–23)
CO2: 21 mmol/L — ABNORMAL LOW (ref 22–32)
Calcium: 9.4 mg/dL (ref 8.9–10.3)
Chloride: 107 mmol/L (ref 98–111)
Creatinine, Ser: 0.76 mg/dL (ref 0.44–1.00)
GFR calc Af Amer: >60 mL/min (ref >60)
GFR calc non Af Amer: >60 mL/min (ref >60)
Glucose, Bld: 307 mg/dL — ABNORMAL HIGH (ref 70–99)
Potassium: 4.5 mmol/L (ref 3.5–5.1)
Sodium: 136 mmol/L (ref 135–145)

## 2020-05-08 LAB — GLUCOSE, CAPILLARY
Glucose-Capillary: 108 mg/dL — ABNORMAL HIGH (ref 70–99)
Glucose-Capillary: 78 mg/dL (ref 70–99)

## 2020-05-08 LAB — PREALBUMIN: Prealbumin: 18.8 mg/dL (ref 18–38)

## 2020-05-08 SURGERY — DEBRIDEMENT, WOUND, STERNUM
Anesthesia: Monitor Anesthesia Care

## 2020-05-08 MED ORDER — PROMETHAZINE HCL 25 MG/ML IJ SOLN: 6.2500 mg | INTRAMUSCULAR | Status: DC | PRN

## 2020-05-08 MED ORDER — LACTATED RINGERS IV SOLN: INTRAVENOUS | Status: DC

## 2020-05-08 MED ORDER — ACETAMINOPHEN 500 MG PO TABS
1000.0000 mg | ORAL_TABLET | Freq: Once | ORAL | Status: AC
  Administered 2020-05-09: 1000 mg via ORAL
  Filled 2020-05-08: qty 2

## 2020-05-08 MED ORDER — SODIUM CHLORIDE 0.9 % IR SOLN
Status: DC | PRN
  Administered 2020-05-08: 1000 mL

## 2020-05-08 MED ORDER — SODIUM CHLORIDE 0.9 % IR SOLN
Status: DC | PRN
  Administered 2020-05-08: 2000 mL

## 2020-05-08 MED ORDER — FENTANYL CITRATE (PF) 100 MCG/2ML IJ SOLN: 25.0000 ug | INTRAMUSCULAR | Status: DC | PRN

## 2020-05-08 MED ORDER — ENSURE ENLIVE PO LIQD
237.0000 mL | Freq: Four times a day (QID) | ORAL | Status: DC
  Administered 2020-05-08 – 2020-05-15 (×20): 237 mL via ORAL

## 2020-05-08 MED ORDER — LACTATED RINGERS IV SOLN: INTRAVENOUS | Status: DC | PRN

## 2020-05-08 MED ORDER — ADULT MULTIVITAMIN W/MINERALS CH
1.0000 | ORAL_TABLET | Freq: Every day | ORAL | Status: DC
  Administered 2020-05-09 – 2020-05-15 (×6): 1 via ORAL
  Filled 2020-05-08 (×6): qty 1

## 2020-05-08 MED ORDER — MIDAZOLAM HCL 2 MG/2ML IJ SOLN
INTRAMUSCULAR | Status: AC
  Filled 2020-05-08: qty 2

## 2020-05-08 MED ORDER — PROPOFOL 500 MG/50ML IV EMUL
INTRAVENOUS | Status: DC | PRN
  Administered 2020-05-08: 75 ug/kg/min via INTRAVENOUS

## 2020-05-08 MED ORDER — MIDAZOLAM HCL 5 MG/5ML IJ SOLN
INTRAMUSCULAR | Status: DC | PRN
  Administered 2020-05-08 (×2): 1 mg via INTRAVENOUS

## 2020-05-08 MED ORDER — LIDOCAINE HCL (CARDIAC) PF 100 MG/5ML IV SOSY
PREFILLED_SYRINGE | INTRAVENOUS | Status: DC | PRN
Start: 2020-05-08 — End: 2020-05-08
  Administered 2020-05-08: 60 mg via INTRAVENOUS

## 2020-05-08 SURGICAL SUPPLY — 54 items
APL SKNCLS STERI-STRIP NONHPOA (GAUZE/BANDAGES/DRESSINGS) ×1
BAG DECANTER FOR FLEXI CONT (MISCELLANEOUS) ×1 IMPLANT
BENZOIN TINCTURE PRP APPL 2/3 (GAUZE/BANDAGES/DRESSINGS) ×1 IMPLANT
BLADE CLIPPER SURG (BLADE) ×2 IMPLANT
BLADE SURG 10 STRL SS (BLADE) ×4 IMPLANT
CANISTER SUCT 3000ML PPV (MISCELLANEOUS) ×1 IMPLANT
CANISTER WOUND CARE 500ML ATS (WOUND CARE) ×2 IMPLANT
CLIP VESOCCLUDE SM WIDE 24/CT (CLIP) IMPLANT
CNTNR URN SCR LID CUP LEK RST (MISCELLANEOUS) IMPLANT
CONT SPEC 4OZ STRL OR WHT (MISCELLANEOUS)
DRAPE CARDIOVASCULAR INCISE (DRAPES)
DRAPE LAPAROSCOPIC ABDOMINAL (DRAPES) ×2 IMPLANT
DRAPE SRG 135X102X78XABS (DRAPES) ×1 IMPLANT
DRSG AQUACEL AG ADV 3.5X14 (GAUZE/BANDAGES/DRESSINGS) ×1 IMPLANT
DRSG VAC ATS LRG SENSATRAC (GAUZE/BANDAGES/DRESSINGS) ×2 IMPLANT
DRSG VAC ATS MED SENSATRAC (GAUZE/BANDAGES/DRESSINGS) ×1 IMPLANT
DRSG VAC ATS SM SENSATRAC (GAUZE/BANDAGES/DRESSINGS) ×1 IMPLANT
ELECT BLADE 4.0 EZ CLEAN MEGAD (MISCELLANEOUS) ×2
ELECT REM PT RETURN 9FT ADLT (ELECTROSURGICAL) ×2
ELECTRODE BLDE 4.0 EZ CLN MEGD (MISCELLANEOUS) IMPLANT
ELECTRODE REM PT RTRN 9FT ADLT (ELECTROSURGICAL) ×1 IMPLANT
GAUZE SPONGE 4X4 12PLY STRL (GAUZE/BANDAGES/DRESSINGS) ×1 IMPLANT
GAUZE XEROFORM 5X9 LF (GAUZE/BANDAGES/DRESSINGS) IMPLANT
GLOVE BIO SURGEON STRL SZ7.5 (GLOVE) ×1 IMPLANT
GLOVE NEODERM STRL 7.5  LF PF (GLOVE) ×1
GLOVE NEODERM STRL 7.5 LF PF (GLOVE) ×1 IMPLANT
GLOVE SURG NEODERM 7.5  LF PF (GLOVE) ×1
GLOVE SURG SS PI 6.5 STRL IVOR (GLOVE) ×2 IMPLANT
GOWN STRL REUS W/ TWL LRG LVL3 (GOWN DISPOSABLE) ×4 IMPLANT
GOWN STRL REUS W/TWL LRG LVL3 (GOWN DISPOSABLE) ×6
HANDPIECE INTERPULSE COAX TIP (DISPOSABLE) ×2
HEMOSTAT POWDER SURGIFOAM 1G (HEMOSTASIS) IMPLANT
HEMOSTAT SURGICEL 2X14 (HEMOSTASIS) IMPLANT
KIT BASIN OR (CUSTOM PROCEDURE TRAY) ×2 IMPLANT
KIT TURNOVER KIT B (KITS) ×2 IMPLANT
MANIFOLD NEPTUNE II (INSTRUMENTS) ×1 IMPLANT
MARKER SKIN DUAL TIP RULER LAB (MISCELLANEOUS) IMPLANT
NS IRRIG 1000ML POUR BTL (IV SOLUTION) ×3 IMPLANT
PACK CHEST (CUSTOM PROCEDURE TRAY) ×2 IMPLANT
PAD ARMBOARD 7.5X6 YLW CONV (MISCELLANEOUS) ×4 IMPLANT
SET HNDPC FAN SPRY TIP SCT (DISPOSABLE) IMPLANT
SOL PREP POV-IOD 4OZ 10% (MISCELLANEOUS) IMPLANT
SUT ETHILON 3 0 FSL (SUTURE) IMPLANT
SUT MNCRL AB 3-0 PS2 18 (SUTURE) ×2 IMPLANT
SUT PDS AB 1 CTX 36 (SUTURE) ×2 IMPLANT
SUT STEEL 6MS V (SUTURE) IMPLANT
SUT STEEL STERNAL CCS#1 18IN (SUTURE) IMPLANT
SUT STEEL SZ 6 DBL 3X14 BALL (SUTURE) IMPLANT
SUT VIC AB 2-0 CTX 27 (SUTURE) IMPLANT
SWAB COLLECTION DEVICE MRSA (MISCELLANEOUS) IMPLANT
SWAB CULTURE ESWAB REG 1ML (MISCELLANEOUS) IMPLANT
TOWEL GREEN STERILE (TOWEL DISPOSABLE) ×1 IMPLANT
TOWEL GREEN STERILE FF (TOWEL DISPOSABLE) ×2 IMPLANT
WATER STERILE IRR 1000ML POUR (IV SOLUTION) ×2 IMPLANT

## 2020-05-08 NOTE — Transfer of Care (Signed)
Immediate Anesthesia Transfer of Care Note  Patient: [name] [name]  Procedure(s) Performed: STERNAL WOUND DEBRIDEMENT (N/A )  Patient Location: PACU  Anesthesia Type:MAC  Level of Consciousness: awake, alert , oriented and patient cooperative  Airway & Oxygen Therapy: Patient Spontanous Breathing and Patient connected to nasal cannula oxygen  Post-op Assessment: Report given to RN and Post -op Vital signs reviewed and stable  Post vital signs: Reviewed and stable  Last Vitals:  Vitals Value Taken Time  BP 142/54 05/08/20 1622  Temp    Pulse 54 05/08/20 1623  Resp 21 05/08/20 1623  SpO2 100 % 05/08/20 1623  Vitals shown include unvalidated device data.  Last Pain:  Vitals:   05/08/20 1300  TempSrc: Oral  PainSc:          Complications: No complications documented.

## 2020-05-08 NOTE — Progress Notes (Addendum)
Initial Nutrition Assessment  DOCUMENTATION CODES:   Non-severe (moderate) malnutrition in context of acute illness/injury  INTERVENTION:   Recommend liberalization of diet to REGULAR   Ensure Enlive po QID, each supplement provides 350 kcal and 20 grams of protein  Magic cup BID with meals, each supplement provides 290 kcal and 9 grams of protein  MVI daily   NUTRITION DIAGNOSIS:   Moderate Malnutrition related to acute illness (recent CABG with complication) as evidenced by percent weight loss, mild fat depletion, moderate muscle depletion, energy intake < 75% for > 7 days.  GOAL:   Patient will meet greater than or equal to 90% of their needs  MONITOR:   PO intake, Supplement acceptance, Weight trends, Diet advancement, Labs, I & O's  REASON FOR ASSESSMENT:   Consult Assessment of nutrition requirement/status  ASSESSMENT:   Patient with PMH significant for HTN, HLD, thyroid cancer, recent CABG on 4/12 complicated by stroke two days later, underwent trach with later decannulation, and discharged home with sternal wound VAC therapy. . Presents this admission with continued sternal wound drainage.   6/19- s/p sternal wound debridement, wound VAC  Plan for return to OR for wound exam and VAC change.   Pt well known to clinical nutrition service. Reports appetite was great upon d/c from hospital but slowly started to decline. Over the last two weeks she was only able to tolerate bites at each meal. She attempted to eat mostly meats and drink protein drinks. Appetite slow to progress this admission. Meal completions charted as 0-25% for her last three meals. She is able to drink 4 Ensures daily and likes ice cream. RD encouraged continued supplement intake to promote post op-wound healing.   Pt endorses a UBW of 135 lb and is unsure if she lost weight recently. Records indicate pt weighed 140 lb on 4/19 and 127 lb this admission (9.3% wt loss in 2 months, significant for time  frame).   Medications: D5 in LR with 10 mEq KCl @ 50 ml/hr Labs: CBG 116-307  NUTRITION - FOCUSED PHYSICAL EXAM:    Most Recent Value  Orbital Region Mild depletion  Upper Arm Region Mild depletion  Thoracic and Lumbar Region Unable to assess  Buccal Region Mild depletion  Temple Region Moderate depletion  Clavicle Bone Region Moderate depletion  Clavicle and Acromion Bone Region Moderate depletion  Scapular Bone Region Unable to assess  Dorsal Hand No depletion  Patellar Region Mild depletion  Anterior Thigh Region Moderate depletion  Posterior Calf Region Mild depletion  Edema (RD Assessment) Mild  Hair Reviewed  Eyes Reviewed  Mouth Reviewed  Skin Reviewed  Nails Reviewed     Diet Order:   Diet Order            Diet NPO time specified Except for: Sips with Meds  Diet effective now                 EDUCATION NEEDS:   Education needs have been addressed  Skin:  Skin Assessment: Skin Integrity Issues: Skin Integrity Issues:: Stage I, Incisions, Wound VAC Stage I: buttocks Wound Vac: chest Incisions: chest  Last BM:  6/21  Height:   Ht Readings from Last 1 Encounters:  05/04/20 5\' 3"  (1.6 m)    Weight:   Wt Readings from Last 1 Encounters:  05/05/20 57.4 kg    BMI:  Body mass index is 22.42 kg/m.  Estimated Nutritional Needs:   Kcal:  1750-1950 kcal  Protein:  85-105 grams  Fluid:  >/=  1.7 L/day   Mariana Single RD, LDN Clinical Nutrition Pager listed in [place]

## 2020-05-08 NOTE — Progress Notes (Signed)
Physical Therapy Treatment Patient Details Name: [name] [name] MRN: [id number] DOB: 05/29/1941 Today's Date: 05/08/2020    History of Present Illness 79 y.o. female was brought to ED, demonstrating chest pain over last few days.  Not worsened by exertion but is reporting being less able to move her legs now.  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 ETT 3/12-3/13 (for procedure), 3/15-3/19. 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    PT Comments    Pt making slow progress with mobility. Feel pt could benefit from CIR to maximize her independence before returning home.    Follow Up Recommendations  CIR     Equipment Recommendations  None recommended by PT    Recommendations for Other Services       Precautions / Restrictions Precautions Precautions: Fall    Mobility  Bed Mobility Overal bed mobility: Needs Assistance Bed Mobility: Supine to Sit;Sit to Supine     Supine to sit: Mod assist;HOB elevated Sit to supine: Min assist   General bed mobility comments: Assist to elevate trunk into sitting  Transfers Overall transfer level: Needs assistance Equipment used: 4-wheeled walker Transfers: Sit to/from Stand Sit to Stand: Mod assist         General transfer comment: Assist to bring hips up and for balance. Pt bracing posterior lower extremities on bed and with posterior lean  Ambulation/Gait Ambulation/Gait assistance: Mod assist;Min assist Gait Distance (Feet): 2 Feet Assistive device: 4-wheeled walker Gait Pattern/deviations: Step-to pattern;Decreased step length - right;Decreased step length - left;Shuffle Gait velocity: reduced Gait velocity interpretation: <1.31 ft/sec, indicative of household ambulator General Gait Details: Initial mod assist to shift weight off of bed. Min assist to step forward and backward    Marine scientist  Rankin (Stroke Patients Only)       Balance Overall balance assessment: Needs assistance Sitting-balance support: No upper extremity supported;Feet supported Sitting balance-Leahy Scale: Fair     Standing balance support: No upper extremity supported;During functional activity Standing balance-Leahy Scale: Poor Standing balance comment: rollator and mod assist progressing to min assist                            Cognition Arousal/Alertness: Awake/alert Behavior During Therapy: Flat affect Overall Cognitive Status: No family/caregiver present to determine baseline cognitive functioning                                        Exercises      General Comments        Pertinent Vitals/Pain Pain Assessment: Faces Faces Pain Scale: Hurts little more Pain Location: chest wound Pain Descriptors / Indicators: Grimacing Pain Intervention(s): Monitored during session;Repositioned    Home Living                      Prior Function            PT Goals (current goals can now be found in the care plan section) Progress towards PT goals: Progressing toward goals    Frequency    Min 3X/week      PT Plan Discharge plan needs to be updated    Co-evaluation  AM-PAC PT "6 Clicks" Mobility   Outcome Measure  Help needed turning from your back to your side while in a flat bed without using bedrails?: A Little Help needed moving from lying on your back to sitting on the side of a flat bed without using bedrails?: A Lot Help needed moving to and from a bed to a chair (including a wheelchair)?: A Lot Help needed standing up from a chair using your arms (e.g., wheelchair or bedside chair)?: A Lot Help needed to walk in hospital room?: A Lot Help needed climbing 3-5 steps with a railing? : Total 6 Click Score: 12    End of Session Equipment Utilized During Treatment: Gait belt Activity Tolerance: Patient tolerated treatment  well;Other (comment) Patient left: in bed;with call bell/phone within reach;with bed alarm set   PT Visit Diagnosis: Unsteadiness on feet (R26.81);Other abnormalities of gait and mobility (R26.89);Muscle weakness (generalized) (M62.81)     Time: 9150-4136 PT Time Calculation (min) (ACUTE ONLY): 33 min  Charges:  $Therapeutic Activity: 23-37 mins                     [place] Pager [phone] Office [place] 05/08/2020, 2:56 PM

## 2020-05-08 NOTE — Plan of Care (Signed)
  Problem: Clinical Measurements: Goal: Ability to maintain clinical measurements within normal limits will improve Outcome: Progressing   Problem: Clinical Measurements: Goal: Ability to maintain clinical measurements within normal limits will improve Outcome: Progressing   Problem: Clinical Measurements: Goal: Postoperative complications will be avoided or minimized Outcome: Progressing

## 2020-05-08 NOTE — Anesthesia Postprocedure Evaluation (Signed)
Anesthesia Post Note  Patient: [name] [name]  Procedure(s) Performed: STERNAL WOUND DEBRIDEMENT (N/A )     Patient location during evaluation: PACU Anesthesia Type: MAC Level of consciousness: awake and alert Pain management: pain level controlled Vital Signs Assessment: post-procedure vital signs reviewed and stable Respiratory status: spontaneous breathing and respiratory function stable Cardiovascular status: stable Postop Assessment: no apparent nausea or vomiting Anesthetic complications: no   No complications documented.  Last Vitals:  Vitals:   05/08/20 1652 05/08/20 1705  BP: (!) 159/65 (!) 147/77  Pulse: (!) 56 (!) 57  Resp: 19   Temp: 36.8 C 36.8 C  SpO2: 99% 100%    Last Pain:  Vitals:   05/08/20 1705  TempSrc: Oral  PainSc:                  [place] Jacuinde DANIEL

## 2020-05-08 NOTE — Progress Notes (Signed)
Patient back from PACU to 4E02. Vital signs obtained. Alert and oriented to room and call light. Call bell within reach. Will continue to monitor.  Paulene Floor, RN

## 2020-05-08 NOTE — Anesthesia Preprocedure Evaluation (Addendum)
Anesthesia Evaluation  Patient identified by MRN, date of birth, ID band Patient awake    Reviewed: Allergy & Precautions, NPO status , Patient's Chart, lab work & pertinent test results, reviewed documented beta blocker date and time   History of Anesthesia Complications Negative for: history of anesthetic complications  Airway Mallampati: II  TM Distance: >3 FB Neck ROM: Full    Dental no notable dental hx. (+) Dental Advisory Given   Pulmonary neg pulmonary ROS,  05/05/2020 SARS coronavirus NEG   Pulmonary exam normal        Cardiovascular hypertension, Pt. on medications and Pt. on home beta blockers (-) angina+ CAD and + CABG (01/2020)  Normal cardiovascular exam  01/2020 ECHO: EF 60-65%. The eft ventricle has normal function, without regional wall motion abnormalities.  Right ventricular systolic function is mildly reduced. The right ventricular size is mildly enlarged.  MV is mildly thickened. Trivial mitral valve regurgitation.    Neuro/Psych CVA (2d post op CABG), Residual Symptoms    GI/Hepatic negative GI ROS, Elevated LFTs   Endo/Other  Hypothyroidism   Renal/GU negative Renal ROS     Musculoskeletal   Abdominal   Peds  Hematology  (+) Blood dyscrasia (Hb 9.4), anemia ,   Anesthesia Other Findings   Reproductive/Obstetrics                            Anesthesia Physical  Anesthesia Plan  ASA: III  Anesthesia Plan: MAC   Post-op Pain Management:    Induction: Intravenous  PONV Risk Score and Plan: 2 and Ondansetron, Treatment may vary due to age or medical condition and Propofol infusion  Airway Management Planned: Natural Airway and Simple Face Mask  Additional Equipment: None  Intra-op Plan:   Post-operative Plan:   Informed Consent: I have reviewed the patients History and Physical, chart, labs and discussed the procedure including the risks, benefits and  alternatives for the proposed anesthesia with the patient or authorized representative who has indicated his/her understanding and acceptance.     Dental advisory given  Plan Discussed with: Anesthesiologist, Surgeon and CRNA  Anesthesia Plan Comments:       Anesthesia Quick Evaluation

## 2020-05-09 ENCOUNTER — Encounter (HOSPITAL_COMMUNITY): Payer: Self-pay | Admitting: Cardiothoracic Surgery

## 2020-05-09 DIAGNOSIS — E440 Moderate protein-calorie malnutrition: Secondary | ICD-10-CM | POA: Diagnosis present

## 2020-05-09 LAB — VANCOMYCIN, TROUGH: Vancomycin Tr: 16 ug/mL (ref 15–20)

## 2020-05-09 LAB — GLUCOSE, CAPILLARY: Glucose-Capillary: 118 mg/dL — ABNORMAL HIGH (ref 70–99)

## 2020-05-09 MED ORDER — TRAMADOL HCL 50 MG PO TABS
50.0000 mg | ORAL_TABLET | Freq: Four times a day (QID) | ORAL | Status: DC | PRN
  Administered 2020-05-09 – 2020-05-13 (×9): 50 mg via ORAL
  Filled 2020-05-09 (×9): qty 1

## 2020-05-09 NOTE — Progress Notes (Signed)
      [address]       [place] [address]             [phone]        1 Day Post-Op Procedure(s) (LRB): STERNAL WOUND DEBRIDEMENT (N/A)  Subjective: Patient has pain at wound VAC area. She wants to know what "things looked like"  Objective: Vital signs in last 24 hours: Temp:  [97.5 F (36.4 C)-98.2 F (36.8 C)] 97.9 F (36.6 C) (06/23 0723) Pulse Rate:  [50-66] 60 (06/23 0723) Cardiac Rhythm: Sinus bradycardia (06/23 0805) Resp:  [10-26] 15 (06/23 0723) BP: (131-159)/(49-135) 142/55 (06/23 0723) SpO2:  [96 %-100 %] 99 % (06/23 0723) Weight:  [63.7 kg] 63.7 kg (06/23 0500)   Current Weight  05/09/20 63.7 kg       Intake/Output from previous day: 06/22 0701 - 06/23 0700 In: 214.4 [I.V.:214.4] Out: 80 [Drains:75; Blood:5]   Physical Exam:  Cardiovascular: RRR Pulmonary: Clear to auscultation bilaterally Abdomen: Soft, non tender, bowel sounds present. Extremities: SCDs in place Wound: Vac in place on sternum  Lab Results: CBC: Recent Labs    05/06/20 1054  WBC 11.9*  HGB 9.3*  HCT 29.9*  PLT 417*   BMET:  Recent Labs    05/08/20 0525  NA 136  K 4.5  CL 107  CO2 21*  GLUCOSE 307*  BUN 25*  CREATININE 0.76  CALCIUM 9.4    PT/INR:  Lab Results  Component Value Date   INR 1.2 05/04/2020   INR 1.1 04/20/2020   INR 1.1 01/30/2020   ABG:  INR: Will add last result for INR, ABG once components are confirmed Will add last 4 CBG results once components are confirmed  Assessment/Plan:  1. CV - SR. On Metoprolol 25 mg bid, Lisinopril 20 mg daily and Rivaroxaban 20 mg at supper  2. Pulmonary-on room air 3. Sternal wound infection-continue wound VAC 4. ID-Last WBC 11,900. On Vancomycin and Cefepime for MRSA and some Pseudomonas CBC in am. As discussed with pharmacist, will stop Cefepime in am 5. Hypothyroidism-continue Levothyroxine 6. CBGs 108/78/118. No history of diabetes. Will stop accu checks and SS PRN  Jayln Branscom  M ZimmermanPA-C 05/09/2020,8:14 AM

## 2020-05-09 NOTE — Progress Notes (Signed)
Patient with complaint of sternal pain not resolved with Tylenol, on-call physician notified, orders given.

## 2020-05-09 NOTE — Progress Notes (Addendum)
Pharmacy Antibiotic Note  [name] [name] is a 79 y.o. female admitted on 05/04/2020 with sternal wound infection S/P CABG (failed conservative/outpatient mgmt) as direct admit from provider's office.  Pharmacy has been consulted for cefepime and vancomycin dosing for wound Infection.  6/23: Vanc trough 16 ug/mL- within target range. CrCl stable at 53.7 mL/min.  Status post debridement and VAC change.  Plan: Continue vancomycin 500 mg IV every 12 hours Monitor renal function length of therapy and ability to transition to PO  Vancomycin trough as needed   Height: 5\' 3"  (160 cm) Weight: 63.7 kg (140 lb 6.9 oz) IBW/kg (Calculated) : 52.4  Temp (24hrs), Avg:97.9 F (36.6 C), Min:97.5 F (36.4 C), Max:98.2 F (36.8 C)  Recent Labs  Lab 05/04/20 2037 05/04/20 2237 05/05/20 0247 05/06/20 0738 05/06/20 1054 05/08/20 0525 05/09/20 0908  WBC 10.8* 10.8*  --   --  11.9*  --   --   CREATININE 0.95 0.93 0.84 0.56  --  0.76  --   VANCOTROUGH  --   --   --   --   --   --  16    Estimated Creatinine Clearance: 53.7 mL/min (by C-G formula based on SCr of 0.76 mg/dL).    Allergies  Allergen Reactions   Bee Venom Swelling    Massive swelling   Penicillins Other (See Comments)    ** tolerates Zosyn + cephalosporins Did it involve swelling of the face/tongue/throat, SOB, or low BP? Unknown Did it involve sudden or severe rash/hives, skin peeling, or any reaction on the inside of your mouth or nose? Unknown Did you need to seek medical attention at a hospital or doctor's office? Unknown When did it last happen?1945 If all above answers are "NO", may proceed with cephalosporin use.   Adhesive [Tape] Rash   Morphine And Related Rash   Sulfa Antibiotics Nausea And Vomiting and Rash    Antimicrobials this admission: 6/18 vancomycin >> 6/18 cefepime >> (6/24)   Microbiology results: Wound cx 6/19 >> few staph aureus Wound cx 6/19 >> few staph aureus, rare  pseudomonas MRSA PCR neg  Doreatha Massed, PharmD Student  05/09/2020 10:30 AM

## 2020-05-09 NOTE — Op Note (Signed)
Procedure(s): STERNAL WOUND DEBRIDEMENT Procedure Note  [name] [name] female 79 y.o. 05/09/2020  Procedure(s) and Anesthesia Type:    * STERNAL WOUND DEBRIDEMENT - General  Surgeon(s) and Role:    * [name], MD - Primary   Indications: The patient is s/p sternal debridement; presents to OR for wound exam and VAC change       Surgeon: [name]   Assistants: staff  Anesthesia: Monitored Local Anesthesia with Sedation  ASA Class: 3    Procedure Detail  STERNAL WOUND DEBRIDEMENT After informed consent, she is taken to the OR on the above date. She is placed in the supine position and anesthesia is confirmed by IV sedation. The anterior chest is cleansed and draped sterilely. A preoperative pause is performed. The wound is inspected and is found to be clean. Copious irrigation is undertaken. The wound defect is filled with a properly sized VAC foam and this is covered by a VAC drape, concluding the procedure.   Estimated Blood Loss:  Minimal         Drains: no          Blood Given: none          Specimens: no         Implants: none        Complications:  * No complications entered in OR log *         Disposition: PACU, stable         Condition: stable

## 2020-05-09 NOTE — Evaluation (Signed)
Occupational Therapy Evaluation Patient Details Name: [name] [name] MRN: [id number] DOB: 15-Dec-1940 Today's Date: 05/09/2020    History of Present Illness 79 y.o. female was brought to ED, demonstrating chest pain over last few days.  Not worsened by exertion but is reporting being less able to move her legs now.  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 ETT 3/12-3/13 (for procedure), 3/15-3/19. 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   Clinical Impression   Pt reports having difficulty with LB ADL and walking with a cane or rollator PTA. She presents with generalized weakness, decreased balance, pain at chest wound and impaired cognition. She requires set up to total assist for ADL and moderate assistance for OOB. Pt will need intensive rehab prior to return home.     Follow Up Recommendations  CIR    Equipment Recommendations  Other (comment) (defer to next venue)    Recommendations for Other Services       Precautions / Restrictions Precautions Precautions: Fall      Mobility Bed Mobility Overal bed mobility: Needs Assistance Bed Mobility: Supine to Sit;Sit to Supine     Supine to sit: Mod assist;HOB elevated Sit to supine: Min assist   General bed mobility comments: Assist to elevate trunk into sitting, HOB up  Transfers Overall transfer level: Needs assistance Equipment used: Rolling walker (2 wheeled) Transfers: Sit to/from [employer] Sit to Stand: Mod assist Stand pivot transfers: Mod assist       General transfer comment: assist to rise and steady, cues for hand placement, posterior lean and stabilizing on bed    Balance Overall balance assessment: Needs assistance Sitting-balance support: No upper extremity supported;Feet supported Sitting balance-Leahy Scale: Fair       Standing balance-Leahy Scale: Poor Standing balance comment: RW and mod assist                            ADL either performed or assessed with clinical judgement   ADL Overall ADL's : Needs assistance/impaired Eating/Feeding: Set up;Sitting   Grooming: Wash/dry hands;Wash/dry face;Oral care;Brushing hair;Sitting;Minimal assistance   Upper Body Bathing: Moderate assistance;Sitting   Lower Body Bathing: Sit to/from stand;Total assistance   Upper Body Dressing : Moderate assistance;Sitting   Lower Body Dressing: Sit to/from stand;Total assistance   Toilet Transfer: Moderate assistance;Stand-pivot;BSC   Toileting- Clothing Manipulation and Hygiene: Moderate assistance;Sit to/from stand               Vision Patient Visual Report: No change from baseline       Perception     Praxis      Pertinent Vitals/Pain Pain Assessment: Faces Faces Pain Scale: Hurts little more Pain Location: chest wound Pain Descriptors / Indicators: Grimacing Pain Intervention(s): Monitored during session;Repositioned     Hand Dominance Right   Extremity/Trunk Assessment Upper Extremity Assessment Upper Extremity Assessment: RUE deficits/detail;Generalized weakness RUE Deficits / Details: longstanding edema in first finger, reports it is from "scraping that stuff off of the ceiling for [name] long"   Lower Extremity Assessment Lower Extremity Assessment: Defer to PT evaluation   Cervical / Trunk Assessment Cervical / Trunk Assessment: Kyphotic   Communication Communication Communication: HOH   Cognition Arousal/Alertness: Awake/alert Behavior During Therapy: Flat affect Overall Cognitive Status: Impaired/Different from baseline Area of Impairment: Problem solving;Safety/judgement  Safety/Judgement: Decreased awareness of safety;Decreased awareness of deficits   Problem Solving: Slow processing;Requires verbal cues;Difficulty sequencing General Comments: pt with difficulty finding words, asking for a "Boost" and specified  "white" when asked what flavor she preferred. Pt with decreased insight into importance of activity to return back home.   General Comments       Exercises     Shoulder Instructions      Home Living Family/patient expects to be discharged to:: Private residence Living Arrangements: Alone Available Help at Discharge: Family;Available PRN/intermittently Type of Home: House Home Access: Stairs to enter [employer] of Steps: 3-4 Entrance Stairs-Rails: Right Home Layout: One level         Bathroom Toilet: Handicapped height     Home Equipment: Environmental consultant - 4 wheels;Cane - single point;Shower seat          Prior Functioning/Environment Level of Independence: Independent with assistive device(s);Independent        Comments: uses rollator and SPC as needed        OT Problem List: Decreased strength;Decreased activity tolerance;Impaired balance (sitting and/or standing);Decreased knowledge of use of DME or AE;Pain;Decreased cognition;Decreased safety awareness      OT Treatment/Interventions: Self-care/ADL training;DME and/or AE instruction;Therapeutic activities;Patient/family education;Balance training;Cognitive remediation/compensation    OT Goals(Current goals can be found in the care plan section) Acute Rehab OT Goals Patient Stated Goal: get stronger and go home OT Goal Formulation: With patient Time For Goal Achievement: 05/23/20 Potential to Achieve Goals: Good ADL Goals Pt Will Perform Grooming: with min assist;standing (2 activities) Pt Will Perform Upper Body Dressing: with supervision;sitting Pt Will Perform Lower Body Dressing: with min assist;with adaptive equipment;sit to/from stand Pt Will Transfer to Toilet: ambulating;bedside commode;with min assist Pt Will Perform Toileting - Clothing Manipulation and hygiene: with min assist;sit to/from stand Additional ADL Goal #1: Pt will perform bed mobility with supervision in preparation for ADL.  OT  Frequency: Min 2X/week   Barriers to D/C:            Co-evaluation              AM-PAC OT "6 Clicks" Daily Activity     Outcome Measure Help from another person eating meals?: None Help from another person taking care of personal grooming?: A Little Help from another person toileting, which includes using toliet, bedpan, or urinal?: A Lot Help from another person bathing (including washing, rinsing, drying)?: A Lot Help from another person to put on and taking off regular upper body clothing?: A Lot Help from another person to put on and taking off regular lower body clothing?: Total 6 Click Score: 14   End of Session Equipment Utilized During Treatment: Rolling walker;Gait belt  Activity Tolerance: Patient tolerated treatment well Patient left: in bed;with call bell/phone within reach;with bed alarm set  OT Visit Diagnosis: Other abnormalities of gait and mobility (R26.89);Unsteadiness on feet (R26.81);Muscle weakness (generalized) (M62.81);Other symptoms and signs involving cognitive function;Cognitive communication deficit (R41.841);Pain                Time: 1400-1420 OT Time Calculation (min): 20 min Charges:  OT General Charges $OT Visit: 1 Visit OT Evaluation $OT Eval Moderate Complexity: 1 Mod  Nestor Lewandowsky, OTR/L Acute Rehabilitation Services Pager: [phone] Office: [phone]  [name] [name] 05/09/2020, 3:23 PM

## 2020-05-10 LAB — CBC
HCT: 27 % — ABNORMAL LOW (ref 36.0–46.0)
Hemoglobin: 8.3 g/dL — ABNORMAL LOW (ref 12.0–15.0)
MCH: 25.6 pg — ABNORMAL LOW (ref 26.0–34.0)
MCHC: 30.7 g/dL (ref 30.0–36.0)
MCV: 83.3 fL (ref 80.0–100.0)
Platelets: 327 10*3/uL (ref 150–400)
RBC: 3.24 MIL/uL — ABNORMAL LOW (ref 3.87–5.11)
RDW: 15.8 % — ABNORMAL HIGH (ref 11.5–15.5)
WBC: 15.2 10*3/uL — ABNORMAL HIGH (ref 4.0–10.5)
nRBC: 0 % (ref 0.0–0.2)

## 2020-05-10 LAB — AEROBIC/ANAEROBIC CULTURE W GRAM STAIN (SURGICAL/DEEP WOUND): Gram Stain: NONE SEEN

## 2020-05-10 LAB — BASIC METABOLIC PANEL
Anion gap: 6 (ref 5–15)
BUN: 21 mg/dL (ref 8–23)
CO2: 26 mmol/L (ref 22–32)
Calcium: 9.6 mg/dL (ref 8.9–10.3)
Chloride: 104 mmol/L (ref 98–111)
Creatinine, Ser: 0.66 mg/dL (ref 0.44–1.00)
GFR calc Af Amer: >60 mL/min (ref >60)
GFR calc non Af Amer: >60 mL/min (ref >60)
Glucose, Bld: 117 mg/dL — ABNORMAL HIGH (ref 70–99)
Potassium: 4 mmol/L (ref 3.5–5.1)
Sodium: 136 mmol/L (ref 135–145)

## 2020-05-10 LAB — C-REACTIVE PROTEIN: CRP: 1.4 mg/dL — ABNORMAL HIGH (ref <1.0)

## 2020-05-10 MED ORDER — VANCOMYCIN HCL IN DEXTROSE 1-5 GM/200ML-% IV SOLN
1000.0000 mg | INTRAVENOUS | Status: AC
  Administered 2020-05-11: 1000 mg via INTRAVENOUS
  Filled 2020-05-10: qty 200

## 2020-05-10 MED ORDER — SODIUM CHLORIDE 0.9 % IV SOLN
2.0000 g | Freq: Two times a day (BID) | INTRAVENOUS | Status: DC
  Administered 2020-05-11 – 2020-05-15 (×10): 2 g via INTRAVENOUS
  Filled 2020-05-10 (×3): qty 2
  Filled 2020-05-10: qty 0.1
  Filled 2020-05-10 (×4): qty 0.07
  Filled 2020-05-10: qty 0.1
  Filled 2020-05-10 (×2): qty 2

## 2020-05-10 NOTE — Progress Notes (Signed)
Inpatient Rehab Admissions Coordinator:   Met with pt at bedside.  Insurance has denied request for CIR.  Pt will need to pursue therapy in a lower level of care (SNF versus HH/OP).  TOC aware.  Will sign off for CIR at this time.    Shann Medal, PT, DPT Admissions Coordinator [phone] 05/10/20  11:58 AM

## 2020-05-10 NOTE — TOC Progression Note (Signed)
Transition of Care (TOC) - Progression Note  Marvetta Gibbons RN, BSN Transitions of Care Unit 4E- RN Case Manager [phone]   Patient Details  Name: SHALEEN TALAMANTEZ MRN: [id number] Date of Birth: 08/05/1941  Transition of Care [facility]) CM/SW Contact  Dahlia Client, Romeo Rabon, RN Phone Number: 05/10/2020, 3:48 PM  Clinical Narrative:    Pt s/p I&D of sternal wound infection with wound VAC placement- notified by CIR that insurance has denied INPT rehab stay. Spoke with pt at bedside to discuss transition of care needs- pt was active with [facility] for [facility] needs PTA and has home [employer] wound VAC at the bedside that she also had pre-admission)- pt lives home alone. Discussed recommendation for safest transition plan to be STSNF-for rehab- pt is agreeable to do a bed search at this time she does states she would not want to go back to the facility she went to before but does not remember the name- will have CSW f/u for bed search and f/u with pt for possible SNF.  Will need to know if pt will need continued IV abx at discharge- per pt she is for OR again tomorrow for further debridement and VAC change.         Expected Discharge Plan and Services                                                 Social Determinants of Health (SDOH) Interventions    Readmission Risk Interventions No flowsheet data found.

## 2020-05-10 NOTE — Progress Notes (Signed)
      [address]       [place] [address]             [phone]      2 Days Post-Op Procedure(s) (LRB): STERNAL WOUND DEBRIDEMENT (N/A) Subjective: Feels okay this morning. She is anxious to go home and be out of the hospital but understands why she is here.   Objective: Vital signs in last 24 hours: Temp:  [97.5 F (36.4 C)-98.7 F (37.1 C)] 97.9 F (36.6 C) (06/24 0811) Pulse Rate:  [63-69] 67 (06/24 0811) Cardiac Rhythm: Normal sinus rhythm (06/24 0819) Resp:  [12-20] 17 (06/24 0811) BP: (136-151)/(55-58) 141/55 (06/24 0811) SpO2:  [96 %-100 %] 100 % (06/24 0811)     Intake/Output from previous day: 06/23 0701 - 06/24 0700 In: -  Out: 1025 [Urine:1000; Drains:25] Intake/Output this shift: No intake/output data recorded.  General appearance: alert, cooperative and no distress Heart: regular rate and rhythm, S1, S2 normal, no murmur, click, rub or gallop Lungs: clear to auscultation bilaterally Abdomen: soft, non-tender; bowel sounds normal; no masses,  no organomegaly Extremities: extremities normal, atraumatic, no cyanosis or edema Wound: clean and dry, wound vac with good suction  Lab Results: Recent Labs    05/10/20 0630  WBC 15.2*  HGB 8.3*  HCT 27.0*  PLT 327   BMET:  Recent Labs    05/08/20 0525 05/10/20 0630  NA 136 136  K 4.5 4.0  CL 107 104  CO2 21* 26  GLUCOSE 307* 117*  BUN 25* 21  CREATININE 0.76 0.66  CALCIUM 9.4 9.6    PT/INR: No results for input(s): LABPROT, INR in the last 72 hours. ABG    Component Value Date/Time   PHART 7.455 (H) 05/05/2020 0018   HCO3 24.4 05/05/2020 0018   TCO2 30 02/02/2020 1735   ACIDBASEDEF 2.0 01/28/2020 1545   O2SAT 96.0 05/05/2020 0018   CBG (last 3)  Recent Labs    05/08/20 0627 05/08/20 1624 05/09/20 0640  GLUCAP 108* 78 118*    Assessment/Plan: S/P Procedure(s) (LRB): STERNAL WOUND DEBRIDEMENT (N/A)  1. CV - SR. On Metoprolol 25 mg bid, Lisinopril 20 mg daily  and Rivaroxaban 20 mg at supper  2. Pulmonary-on room air 3. Sternal wound infection-continue wound VAC, good suction 4. ID-WBC 15k. On Vancomycin and Cefepime for MRSA and some Pseudomonas 5. Hypothyroidism-continue Levothyroxine  Plan: Continue PT for debility. Continue ensure for nutrition supplementation. Continue IV antibiotics.    LOS: 6 days    [name] 05/10/2020

## 2020-05-10 NOTE — Progress Notes (Signed)
Physical Therapy Treatment Patient Details Name: [name] [name] MRN: [id number] DOB: 09/25/1941 Today's Date: 05/10/2020    History of Present Illness 79 y.o. female was brought to ED, demonstrating chest pain over last few days.  Not worsened by exertion but is reporting being less able to move her legs now.  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 ETT 3/12-3/13 (for procedure), 3/15-3/19. 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    PT Comments    Pt in bed upon arrival of PT, agreeable to session with focus on progressing functional mobility. The pt was able to complete multiple rolls in the bed and transition to sitting EOB with minA and use of bed rails/elevated HOB. Upon sitting the pt reported sig dizziness, but vital signs remained stable. The pt was eventually able to complete multiple sit-stand transfers with modA and use of RW, as well as small lateral steps to the [facility], but was unable to further progress mobility at this time due to sig weakness and fatigue from the transfers. The pt will continue to benefit from skilled PT to further progress functional strength and mobility.     Follow Up Recommendations  SNF;Supervision/Assistance - 24 hour     Equipment Recommendations  None recommended by PT    Recommendations for Other Services       Precautions / Restrictions Precautions Precautions: Fall Precaution Comments: monitor O2 sats Restrictions Weight Bearing Restrictions: No    Mobility  Bed Mobility Overal bed mobility: Needs Assistance Bed Mobility: Supine to Sit;Sit to Supine     Supine to sit: Min assist;HOB elevated Sit to supine: Min assist   General bed mobility comments: minA to elevate trunk from bed, minA to BLE  Transfers Overall transfer level: Needs assistance Equipment used: Rolling walker (2 wheeled) Transfers: Sit to/from [facility] Sit to Stand:  Mod assist         General transfer comment: modA to power up, cues for hand placement and modA to steady. sig posterior lean against bed  Ambulation/Gait Ambulation/Gait assistance: Min assist Gait Distance (Feet): 2 Feet Assistive device: Rolling walker (2 wheeled) Gait Pattern/deviations: Step-to pattern;Decreased step length - right;Decreased step length - left;Shuffle Gait velocity: reduced   General Gait Details: sig assist to offweight and take 2 small lateral steps to [facility]       Balance Overall balance assessment: Needs assistance Sitting-balance support: No upper extremity supported;Feet supported Sitting balance-Leahy Scale: Fair     Standing balance support: During functional activity;Bilateral upper extremity supported Standing balance-Leahy Scale: Poor                              Cognition Arousal/Alertness: Awake/alert Behavior During Therapy: Flat affect Overall Cognitive Status: Impaired/Different from baseline Area of Impairment: Problem solving;Safety/judgement                         Safety/Judgement: Decreased awareness of safety;Decreased awareness of deficits   Problem Solving: Slow processing;Requires verbal cues;Difficulty sequencing General Comments: pt with difficulty finsing words to describe discomfort/pain. Pt agreeable, but with decreased insight to severity of deficits      Exercises      General Comments General comments (skin integrity, edema, etc.): VSS despite pt reports of dizziness upon standing      Pertinent Vitals/Pain Pain Assessment: Faces Faces Pain Scale: Hurts little  more Pain Location: chest wound, genitals around purewick Pain Descriptors / Indicators: Grimacing;Sore Pain Intervention(s): Limited activity within patient's tolerance;Monitored during session;Repositioned           PT Goals (current goals can now be found in the care plan section) Acute Rehab PT Goals Patient Stated Goal:  get stronger and go home PT Goal Formulation: Patient unable to participate in goal setting Time For Goal Achievement: 05/20/20 Potential to Achieve Goals: Good Progress towards PT goals: Progressing toward goals    Frequency    Min 3X/week      PT Plan Discharge plan needs to be updated    Co-evaluation              AM-PAC PT "6 Clicks" Mobility   Outcome Measure  Help needed turning from your back to your side while in a flat bed without using bedrails?: A Little Help needed moving from lying on your back to sitting on the side of a flat bed without using bedrails?: A Lot Help needed moving to and from a bed to a chair (including a wheelchair)?: A Lot Help needed standing up from a chair using your arms (e.g., wheelchair or bedside chair)?: A Lot Help needed to walk in hospital room?: A Lot Help needed climbing 3-5 steps with a railing? : Total 6 Click Score: 12    End of Session Equipment Utilized During Treatment: Gait belt Activity Tolerance: Patient tolerated treatment well;Patient limited by fatigue Patient left: in bed;with call bell/phone within reach;with bed alarm set (in chair position) Nurse Communication: Mobility status PT Visit Diagnosis: Unsteadiness on feet (R26.81);Other abnormalities of gait and mobility (R26.89);Muscle weakness (generalized) (M62.81) Hemiplegia - Right/Left: Right Hemiplegia - dominant/non-dominant: Dominant Hemiplegia - caused by: Cerebral infarction Pain - part of body:  (sternum)     Time: 2376-2831 PT Time Calculation (min) (ACUTE ONLY): 40 min  Charges:  $Gait Training: 8-22 mins $Therapeutic Activity: 23-37 mins                     Karma Ganja, PT, DPT   Acute Rehabilitation Department Pager #: [phone]   Otho Bellows 05/10/2020, 5:48 PM

## 2020-05-11 ENCOUNTER — Inpatient Hospital Stay (HOSPITAL_COMMUNITY): Payer: PPO

## 2020-05-11 ENCOUNTER — Encounter (HOSPITAL_COMMUNITY)
Admission: AD | Disposition: A | Discharge status: Skilled Nursing Facility | Payer: Self-pay | Source: Ambulatory Visit | Attending: Cardiothoracic Surgery

## 2020-05-11 ENCOUNTER — Encounter (HOSPITAL_COMMUNITY): Payer: Self-pay | Admitting: Cardiothoracic Surgery

## 2020-05-11 ENCOUNTER — Inpatient Hospital Stay (HOSPITAL_COMMUNITY): Payer: PPO | Admitting: Certified Registered"

## 2020-05-11 DIAGNOSIS — D649 Anemia, unspecified: Secondary | ICD-10-CM | POA: Diagnosis present

## 2020-05-11 DIAGNOSIS — M9689 Other intraoperative and postprocedural complications and disorders of the musculoskeletal system: Secondary | ICD-10-CM | POA: Diagnosis present

## 2020-05-11 DIAGNOSIS — A4902 Methicillin resistant Staphylococcus aureus infection, unspecified site: Secondary | ICD-10-CM | POA: Diagnosis present

## 2020-05-11 DIAGNOSIS — Z86711 Personal history of pulmonary embolism: Secondary | ICD-10-CM | POA: Diagnosis present

## 2020-05-11 DIAGNOSIS — A498 Other bacterial infections of unspecified site: Secondary | ICD-10-CM | POA: Diagnosis present

## 2020-05-11 HISTORY — PX: STERNAL WOUND DEBRIDEMENT: SHX1058

## 2020-05-11 LAB — POCT I-STAT, CHEM 8
BUN: 26 mg/dL — ABNORMAL HIGH (ref 8–23)
Calcium, Ion: 1.5 mmol/L — ABNORMAL HIGH (ref 1.15–1.40)
Chloride: 102 mmol/L (ref 98–111)
Creatinine, Ser: 0.6 mg/dL (ref 0.44–1.00)
Glucose, Bld: 102 mg/dL — ABNORMAL HIGH (ref 70–99)
HCT: 24 % — ABNORMAL LOW (ref 36.0–46.0)
Hemoglobin: 8.2 g/dL — ABNORMAL LOW (ref 12.0–15.0)
Potassium: 4.5 mmol/L (ref 3.5–5.1)
Sodium: 135 mmol/L (ref 135–145)
TCO2: 27 mmol/L (ref 22–32)

## 2020-05-11 LAB — TYPE AND SCREEN
ABO/RH(D): A POS
Antibody Screen: NEGATIVE

## 2020-05-11 SURGERY — DEBRIDEMENT, WOUND, STERNUM
Anesthesia: General

## 2020-05-11 MED ORDER — LACTATED RINGERS IV SOLN: INTRAVENOUS | Status: DC

## 2020-05-11 MED ORDER — 0.9 % SODIUM CHLORIDE (POUR BTL) OPTIME
TOPICAL | Status: DC | PRN
Start: 2020-05-11 — End: 2020-05-11
  Administered 2020-05-11: 2000 mL

## 2020-05-11 MED ORDER — EPHEDRINE SULFATE 50 MG/ML IJ SOLN
INTRAMUSCULAR | Status: DC | PRN
Start: 2020-05-11 — End: 2020-05-11
  Administered 2020-05-11: 5 mg via INTRAVENOUS
  Administered 2020-05-11: 10 mg via INTRAVENOUS

## 2020-05-11 MED ORDER — MIDAZOLAM HCL 5 MG/5ML IJ SOLN
INTRAMUSCULAR | Status: DC | PRN
  Administered 2020-05-11 (×2): .5 mg via INTRAVENOUS

## 2020-05-11 MED ORDER — PHENYLEPHRINE HCL (PRESSORS) 10 MG/ML IV SOLN
INTRAVENOUS | Status: DC | PRN
Start: 2020-05-11 — End: 2020-05-11
  Administered 2020-05-11: 80 ug via INTRAVENOUS

## 2020-05-11 MED ORDER — MIDAZOLAM HCL 2 MG/2ML IJ SOLN
INTRAMUSCULAR | Status: AC
  Filled 2020-05-11: qty 2

## 2020-05-11 MED ORDER — ONDANSETRON HCL 4 MG/2ML IJ SOLN
INTRAMUSCULAR | Status: DC | PRN
  Administered 2020-05-11: 4 mg via INTRAVENOUS

## 2020-05-11 MED ORDER — FENTANYL CITRATE (PF) 250 MCG/5ML IJ SOLN
INTRAMUSCULAR | Status: AC
  Filled 2020-05-11: qty 5

## 2020-05-11 MED ORDER — FENTANYL CITRATE (PF) 100 MCG/2ML IJ SOLN
INTRAMUSCULAR | Status: DC | PRN
  Administered 2020-05-11: 50 ug via INTRAVENOUS
  Administered 2020-05-11 (×3): 25 ug via INTRAVENOUS

## 2020-05-11 MED ORDER — CHLORHEXIDINE GLUCONATE 0.12 % MT SOLN
OROMUCOSAL | Status: AC
  Filled 2020-05-11: qty 15

## 2020-05-11 MED ORDER — CHLORHEXIDINE GLUCONATE 0.12 % MT SOLN
15.0000 mL | Freq: Once | OROMUCOSAL | Status: AC
  Administered 2020-05-11: 15 mL via OROMUCOSAL

## 2020-05-11 MED ORDER — LIDOCAINE 2% (20 MG/ML) 5 ML SYRINGE
INTRAMUSCULAR | Status: DC | PRN
  Administered 2020-05-11: 60 mg via INTRAVENOUS

## 2020-05-11 MED ORDER — ONDANSETRON HCL 4 MG/2ML IJ SOLN: 4.0000 mg | Freq: Once | INTRAMUSCULAR | Status: DC | PRN

## 2020-05-11 MED ORDER — SODIUM CHLORIDE 0.9 % IR SOLN
Status: DC | PRN
  Administered 2020-05-11: 1000 mL

## 2020-05-11 MED ORDER — DEXAMETHASONE SODIUM PHOSPHATE 10 MG/ML IJ SOLN
INTRAMUSCULAR | Status: DC | PRN
  Administered 2020-05-11: 4 mg via INTRAVENOUS

## 2020-05-11 MED ORDER — PROPOFOL 10 MG/ML IV BOLUS
INTRAVENOUS | Status: DC | PRN
  Administered 2020-05-11: 100 mg via INTRAVENOUS

## 2020-05-11 MED ORDER — ORAL CARE MOUTH RINSE: 15.0000 mL | Freq: Once | OROMUCOSAL | Status: AC

## 2020-05-11 MED ORDER — PROPOFOL 10 MG/ML IV BOLUS
INTRAVENOUS | Status: AC
  Filled 2020-05-11: qty 20

## 2020-05-11 MED ORDER — SODIUM CHLORIDE 0.9 % IV SOLN
INTRAVENOUS | Status: DC | PRN
  Administered 2020-05-11: 500 mL

## 2020-05-11 MED ORDER — SUGAMMADEX SODIUM 200 MG/2ML IV SOLN
INTRAVENOUS | Status: DC | PRN
  Administered 2020-05-11: 150 mg via INTRAVENOUS

## 2020-05-11 MED ORDER — FENTANYL CITRATE (PF) 100 MCG/2ML IJ SOLN: 25.0000 ug | INTRAMUSCULAR | Status: DC | PRN

## 2020-05-11 MED ORDER — ACETAMINOPHEN 10 MG/ML IV SOLN: 1000.0000 mg | Freq: Once | INTRAVENOUS | Status: DC | PRN

## 2020-05-11 MED ORDER — SODIUM CHLORIDE 0.9 % IV SOLN
INTRAVENOUS | Status: AC
  Filled 2020-05-11: qty 500000

## 2020-05-11 MED ORDER — ROCURONIUM BROMIDE 10 MG/ML (PF) SYRINGE
PREFILLED_SYRINGE | INTRAVENOUS | Status: DC | PRN
  Administered 2020-05-11: 40 mg via INTRAVENOUS
  Administered 2020-05-11: 10 mg via INTRAVENOUS
  Administered 2020-05-11: 20 mg via INTRAVENOUS

## 2020-05-11 MED ORDER — PHENYLEPHRINE HCL-NACL 10-0.9 MG/250ML-% IV SOLN
INTRAVENOUS | Status: DC | PRN
  Administered 2020-05-11: 50 ug/min via INTRAVENOUS

## 2020-05-11 MED ORDER — LACTATED RINGERS IV SOLN: INTRAVENOUS | Status: DC | PRN

## 2020-05-11 SURGICAL SUPPLY — 61 items
BATTERY MAXDRIVER (MISCELLANEOUS) ×1 IMPLANT
BLADE CLIPPER SURG (BLADE) ×1 IMPLANT
BLADE SURG 10 STRL SS (BLADE) ×1 IMPLANT
CANISTER SUCT 3000ML PPV (MISCELLANEOUS) ×2 IMPLANT
CANISTER WOUNDNEG PRESSURE 500 (CANNISTER) ×1 IMPLANT
CNTNR URN SCR LID CUP LEK RST (MISCELLANEOUS) IMPLANT
CONT SPEC 4OZ STRL OR WHT (MISCELLANEOUS)
DRAIN WOUND SNY 15 RND (WOUND CARE) ×2 IMPLANT
DRAPE LAPAROSCOPIC ABDOMINAL (DRAPES) ×2 IMPLANT
DRAPE SLUSH/WARMER DISC (DRAPES) ×1 IMPLANT
DRSG PAD ABDOMINAL 8X10 ST (GAUZE/BANDAGES/DRESSINGS) IMPLANT
ELECT BLADE 4.0 EZ CLEAN MEGAD (MISCELLANEOUS) ×2
ELECT REM PT RETURN 9FT ADLT (ELECTROSURGICAL) ×2
ELECTRODE BLDE 4.0 EZ CLN MEGD (MISCELLANEOUS) IMPLANT
ELECTRODE REM PT RTRN 9FT ADLT (ELECTROSURGICAL) ×1 IMPLANT
EVACUATOR SILICONE 100CC (DRAIN) ×2 IMPLANT
GAUZE SPONGE 4X4 12PLY STRL (GAUZE/BANDAGES/DRESSINGS) ×2 IMPLANT
GLOVE BIO SURGEON STRL SZ8 (GLOVE) ×1 IMPLANT
GLOVE SURG SIGNA 7.5 PF LTX (GLOVE) ×4 IMPLANT
GOWN STRL REUS W/ TWL LRG LVL3 (GOWN DISPOSABLE) ×2 IMPLANT
GOWN STRL REUS W/ TWL XL LVL3 (GOWN DISPOSABLE) ×1 IMPLANT
GOWN STRL REUS W/TWL LRG LVL3 (GOWN DISPOSABLE) ×4
GOWN STRL REUS W/TWL XL LVL3 (GOWN DISPOSABLE) ×2
HANDPIECE INTERPULSE COAX TIP (DISPOSABLE) ×2
HEMOSTAT POWDER SURGIFOAM 1G (HEMOSTASIS) IMPLANT
KIT BASIN OR (CUSTOM PROCEDURE TRAY) ×2 IMPLANT
KIT PREVENA INCISION MGT20CM45 (CANNISTER) ×1 IMPLANT
KIT TURNOVER KIT B (KITS) ×2 IMPLANT
NS IRRIG 1000ML POUR BTL (IV SOLUTION) ×4 IMPLANT
PACK CHEST (CUSTOM PROCEDURE TRAY) ×2 IMPLANT
PAD ARMBOARD 7.5X6 YLW CONV (MISCELLANEOUS) ×4 IMPLANT
PENCIL BUTTON HOLSTER BLD 10FT (ELECTRODE) ×1 IMPLANT
PLATE BONE LOCK TI 1.8 10H (Plate) ×1 IMPLANT
PLATE BONE LOCK TI 1.8 XSH 8H (Plate) ×1 IMPLANT
PLATE STERNAL 1.8MM THICK (Plate) ×1 IMPLANT
PUTTY DBX 5CC (Putty) ×1 IMPLANT
SCREW LOCKING TI 2.3X13MM (Screw) ×15 IMPLANT
SCREW STERNAL 2.3X17MM (Screw) ×6 IMPLANT
SCREW STERNAL LOCK 2.3MM (Screw) ×2 IMPLANT
SET HNDPC FAN SPRY TIP SCT (DISPOSABLE) IMPLANT
SPONGE LAP 18X18 RF (DISPOSABLE) ×2 IMPLANT
STAPLER VISISTAT 35W (STAPLE) ×1 IMPLANT
SUT ETHILON 3 0 FSL (SUTURE) ×2 IMPLANT
SUT PDS AB 1 CTX 36 (SUTURE) ×2 IMPLANT
SUT STEEL 6MS V (SUTURE) IMPLANT
SUT STEEL STERNAL CCS#1 18IN (SUTURE) IMPLANT
SUT STEEL SZ 6 DBL 3X14 BALL (SUTURE) IMPLANT
SUT VIC AB 1 CTX 36 (SUTURE)
SUT VIC AB 1 CTX36XBRD ANBCTR (SUTURE) ×2 IMPLANT
SUT VIC AB 2-0 CTX 27 (SUTURE) ×2 IMPLANT
SUT VIC AB 2-0 CTX 36 (SUTURE) ×2 IMPLANT
SUT VIC AB 3-0 X1 27 (SUTURE) ×2 IMPLANT
SWAB COLLECTION DEVICE MRSA (MISCELLANEOUS) IMPLANT
SWAB CULTURE ESWAB REG 1ML (MISCELLANEOUS) IMPLANT
SYR 5ML LL (SYRINGE) IMPLANT
TAPE CLOTH SURG 4X10 WHT LF (GAUZE/BANDAGES/DRESSINGS) ×1 IMPLANT
TOWEL GREEN STERILE (TOWEL DISPOSABLE) ×2 IMPLANT
TOWEL GREEN STERILE FF (TOWEL DISPOSABLE) ×2 IMPLANT
TRAY FOLEY MTR SLVR 14FR STAT (SET/KITS/TRAYS/PACK) IMPLANT
WATER STERILE IRR 1000ML POUR (IV SOLUTION) ×2 IMPLANT
YANKAUER SUCT BULB TIP NO VENT (SUCTIONS) ×1 IMPLANT

## 2020-05-11 NOTE — Progress Notes (Signed)
      [address]       [place] [address]             [phone]       Consult placed to ID for length of therapy of IV antibiotics started for sternal wound infection. Plan for transition to SNF soon.    Nicholes Rough, PA-C

## 2020-05-11 NOTE — Anesthesia Preprocedure Evaluation (Addendum)
Anesthesia Evaluation  Patient identified by MRN, date of birth, ID band Patient awake    Reviewed: Allergy & Precautions, NPO status , Patient's Chart, lab work & pertinent test results  Airway Mallampati: II  TM Distance: >3 FB Neck ROM: Full    Dental no notable dental hx.    Pulmonary  Previous tracheostomy   Pulmonary exam normal breath sounds clear to auscultation       Cardiovascular hypertension, Pt. on medications and Pt. on home beta blockers + CAD and + CABG  Normal cardiovascular exam Rhythm:Regular Rate:Normal     Neuro/Psych CVA negative psych ROS   GI/Hepatic negative GI ROS, Neg liver ROS,   Endo/Other  Hypothyroidism   Renal/GU negative Renal ROS     Musculoskeletal negative musculoskeletal ROS (+)   Abdominal   Peds  Hematology  (+) anemia , HLD    Anesthesia Other Findings sternal infection  Reproductive/Obstetrics                           Anesthesia Physical Anesthesia Plan  ASA: III  Anesthesia Plan: General   Post-op Pain Management:    Induction: Intravenous  PONV Risk Score and Plan: 3 and Ondansetron, Dexamethasone and Treatment may vary due to age or medical condition  Airway Management Planned: Oral ETT  Additional Equipment:   Intra-op Plan:   Post-operative Plan: Extubation in OR  Informed Consent: I have reviewed the patients History and Physical, chart, labs and discussed the procedure including the risks, benefits and alternatives for the proposed anesthesia with the patient or authorized representative who has indicated his/her understanding and acceptance.     Dental advisory given  Plan Discussed with: CRNA  Anesthesia Plan Comments:        Anesthesia Quick Evaluation

## 2020-05-11 NOTE — Anesthesia Postprocedure Evaluation (Signed)
Anesthesia Post Note  Patient: [name] [name]  Procedure(s) Performed: STERNAL DEBRIDEMENT AND STERNAL CLOSURE (N/A )     Patient location during evaluation: PACU Anesthesia Type: General Level of consciousness: awake and alert, oriented and patient cooperative Pain management: pain level controlled Vital Signs Assessment: post-procedure vital signs reviewed and stable Respiratory status: spontaneous breathing, nonlabored ventilation, respiratory function stable and patient connected to nasal cannula oxygen Cardiovascular status: blood pressure returned to baseline and stable Postop Assessment: no apparent nausea or vomiting Anesthetic complications: no   No complications documented.  Last Vitals:  Vitals:   05/11/20 1620 05/11/20 1633  BP:  (!) 154/60  Pulse: (!) 57 (!) 58  Resp: 15 16  Temp: 36.6 C (!) 36.3 C  SpO2: 99% 98%    Last Pain:  Vitals:   05/11/20 1633  TempSrc: Oral  PainSc: 6                  Deoni Cosey,E. Faruq Rosenberger

## 2020-05-11 NOTE — Brief Op Note (Signed)
05/11/2020  4:16 PM  PATIENT:  [name] [name]  79 y.o. female  PRE-OPERATIVE DIAGNOSIS:  sternal infection  POST-OPERATIVE DIAGNOSIS:  sternal infection  PROCEDURE:  Procedure(s): STERNAL DEBRIDEMENT AND STERNAL CLOSURE (N/A)  Rigid sternal reconstruction with titanium plating system Bilateral pectoralis major advancement flaps Application of Prevena incisional management system  SURGEON:  Surgeon(s) and Role:    * [name], MD - Primary  PHYSICIAN ASSISTANT:   ASSISTANTS: staff   ANESTHESIA:   general  EBL:  200 mL   BLOOD ADMINISTERED:none  DRAINS: (2) Jackson-Pratt drain(s) with closed bulb suction in the space anterior to sternum and beneath pec flaps   LOCAL MEDICATIONS USED:  NONE  SPECIMEN:  No Specimen  DISPOSITION OF SPECIMEN:  N/A  COUNTS:  YES  TOURNIQUET:  * No tourniquets in log *  DICTATION: .Note written in EPIC  PLAN OF CARE: Admit to inpatient   PATIENT DISPOSITION:  PACU - hemodynamically stable.   Delay start of Pharmacological VTE agent (>24hrs) due to surgical blood loss or risk of bleeding: yes

## 2020-05-11 NOTE — Op Note (Signed)
Procedure(s): STERNAL DEBRIDEMENT AND STERNAL CLOSURE Procedure Note  [name] [name] female 79 y.o. 05/11/2020  Procedure(s) and Anesthesia Type:    * STERNAL DEBRIDEMENT AND STERNAL CLOSURE - General  Surgeon(s) and Role:    * [name], MD - Primary   Indications: The patient has been admitted for advanced wound care related to his sternal infection status post CABG.  She now presents for sternal closure. Surgeon: [name]   Assistants: Staff  Anesthesia: General endotracheal anesthesia  ASA Class: 3    Procedure Detail  STERNAL DEBRIDEMENT AND STERNAL CLOSURE After informed consent, the patient was taken to the operating room at [facility] on the above listed date.  She is placed in the supine position on the operating table.  Anesthesia was begun in a smooth fashion by the general endotracheal technique.  After confirming adequate anesthesia, the anterior chest was cleansed and draped as a sterile field using Betadine paint and soap.  Preop surgical pause was performed.  The wound was copiously irrigated.  The sternal edges were gently debrided with a curette.  This was to remove any pseudoarthrosis that it accumulated on the bone edges.  Next bilateral pectoralis major flaps were created by lifting the pack itself off the chest wall bilaterally.  This was done sufficiently in a lateral direction on each side to allow mobilization of pec towards the midline.  When this completed, 15 French drains x 2 were placed, each beneath individual pec muscle.  The sternum itself was then reapproximated in a rigid fashion with titanium plating system.  Once is completed the packs were sutured together in the midline.  The soft tissue was then closed in layers staples were used to reapproximate the skin and a Prevena incisional management system was placed in the closed incision as a sterile dressing.  All sponge instrument and needle counts were correct and I was  present and participated in all aspects of the procedure.  Estimated Blood Loss:  200 mL         Drains: 15 Fr JP x 2         Total IV Fluids: per anes  Blood Given: none          Specimens: no         Implants: sternal hardware        Complications:  * No complications entered in OR log *         Disposition: PACU - hemodynamically stable.         Condition: stable  [name] [name], [name] [name]

## 2020-05-11 NOTE — Progress Notes (Signed)
Occupational Therapy Treatment Patient Details Name: [name] [name] MRN: [id number] DOB: 24-Apr-1941 Today's Date: 05/11/2020    History of present illness 79 y.o. female was brought to ED, demonstrating chest pain over last few days.  Not worsened by exertion but is reporting being less able to move her legs now.  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 ETT 3/12-3/13 (for procedure), 3/15-3/19. 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   OT comments  Pt with complaints of her buttocks being sore, declined sitting in chair and decreased tolerance of sitting at EOB during grooming activity. Stood x 1 with moderate assistance from EOB. Pt with significant weakness. CIR declined, updated d/c recommendation to SNF. Unclear if pt appreciates her current level of function and need for further rehab. Will continue to follow.  Follow Up Recommendations  SNF;Supervision/Assistance - 24 hour    Equipment Recommendations  Other (comment) (defer to next venue)    Recommendations for Other Services      Precautions / Restrictions Precautions Precautions: Fall       Mobility Bed Mobility Overal bed mobility: Needs Assistance Bed Mobility: Rolling;Sidelying to Sit;Sit to Sidelying Rolling: Min assist Sidelying to sit: Min assist     Sit to sidelying: Mod assist General bed mobility comments: assist to raise trunk and postion hips at EOB with bed pad, assist for LEs back into bed, positioned in sidelying with rolled blanket to her back and pillow between knees   Transfers Overall transfer level: Needs assistance Equipment used: Rolling walker (2 wheeled) Transfers: Sit to/from Stand Sit to Stand: Mod assist         General transfer comment: mod assist to rise and steady, posterior bias, stabilizing LEs on bed    Balance Overall balance assessment: Needs assistance   Sitting balance-Leahy Scale: Fair      Standing balance support: Bilateral upper extremity supported Standing balance-Leahy Scale: Poor Standing balance comment: RW and mod assist                           ADL either performed or assessed with clinical judgement   ADL Overall ADL's : Needs assistance/impaired     Grooming: Brushing hair;Sitting;Supervision/safety                                       Vision       Perception     Praxis      Cognition Arousal/Alertness: Awake/alert Behavior During Therapy: Flat affect Overall Cognitive Status: Impaired/Different from baseline Area of Impairment: Problem solving;Safety/judgement                         Safety/Judgement: Decreased awareness of deficits   Problem Solving: Slow processing;Requires verbal cues;Difficulty sequencing General Comments: Pt stating she is not going to rehab when she discharges, unclear if she is referring to CIR or SNF, pt continues to demonstrate decreased insight        Exercises     Shoulder Instructions       General Comments      Pertinent Vitals/ Pain       Pain Assessment: Faces Faces Pain Scale: Hurts even more Pain Location: buttocks Pain Descriptors / Indicators: Grimacing;Sore Pain Intervention(s): Repositioned (positioned pt on R side )  Home  Living                                          Prior Functioning/Environment              Frequency  Min 2X/week        Progress Toward Goals  OT Goals(current goals can now be found in the care plan section)  Progress towards OT goals: Progressing toward goals  Acute Rehab OT Goals Patient Stated Goal: get stronger and go home OT Goal Formulation: With patient Time For Goal Achievement: 05/23/20 Potential to Achieve Goals: Good  Plan Discharge plan needs to be updated    Co-evaluation                 AM-PAC OT "6 Clicks" Daily Activity     Outcome Measure   Help from another person  eating meals?: None Help from another person taking care of personal grooming?: A Little Help from another person toileting, which includes using toliet, bedpan, or urinal?: A Lot Help from another person bathing (including washing, rinsing, drying)?: A Lot Help from another person to put on and taking off regular upper body clothing?: A Lot Help from another person to put on and taking off regular lower body clothing?: Total 6 Click Score: 14    End of Session Equipment Utilized During Treatment: Rolling walker;Gait belt  OT Visit Diagnosis: Other abnormalities of gait and mobility (R26.89);Unsteadiness on feet (R26.81);Muscle weakness (generalized) (M62.81);Other symptoms and signs involving cognitive function;Cognitive communication deficit (R41.841);Pain   Activity Tolerance Patient tolerated treatment well   Patient Left in bed;with call bell/phone within reach;with bed alarm set   Nurse Communication          Time: 8675-4492 OT Time Calculation (min): 18 min  Charges: OT General Charges $OT Visit: 1 Visit OT Treatments $Therapeutic Activity: 8-22 mins  Nestor Lewandowsky, OTR/L Acute Rehabilitation Services Pager: [phone] Office: [phone]   [name] [name] 05/11/2020, 1:00 PM

## 2020-05-11 NOTE — Discharge Summary (Addendum)
[address]       [place] [address]             [phone]      Physician Discharge Summary  Patient ID: [name] [name] MRN: [id number] DOB/AGE: [birth year] 79 y.o.  Admit date: 05/04/2020 Discharge date: 05/15/2020  Admission Diagnoses: Patient Active Problem List   Diagnosis Date Noted  . MRSA infection 05/11/2020  . Pseudomonas infection 05/11/2020  . 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 Normocytic anemia 05/11/2020  . History of pulmonary embolus (PE) 05/11/2020  . Malnutrition of moderate degree 05/09/2020  . Pressure injury of skin 05/04/2020  . Mediastinitis 05/04/2020  . PFO (patent foramen ovale) 04/30/2020  . Sternal wound infection 04/20/2020  . Subglottic edema   . History of open heart surgery   . Pleural effusion on right   . Status post tracheostomy ([place])   . History of CVA (cerebrovascular accident)   . History of thyroid cancer   . Leukocytosis   . Acute blood loss anemia   . Acute hypoxemic respiratory failure ([place])   . Tracheostomy status ([place])   . 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 3-vessel CAD 01/26/2020  . Coronary artery disease of native artery of native heart with stable angina pectoris ([place])   . Precordial chest pain 06/15/2014  . Carotid stenosis 06/15/2014  . 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  She underwent coronary artery bypass grafting on 5/45/6256 which was complicated by stroke 2 days after surgery.   She ultimately did well until approximately 1 month ago when she developed sternal wound drainage.  This has been managed as an outpatient with wound VAC therapy.  However she continues to feel ill and the wound continues to drain.  She is admitted to the hospital for definitive management.  She denies fevers or chills.  Hospital Course:  Admitted on 6/18 for failed outpatient wound therapy. She was taken to the OR on 6/19 for a sternal wound debridement and application of wound vac. She was started on IV antibiotics and PT was consulted for debility and history of frequent falls at home. She was taken to the OR again on 6/23 for another sternal wound debridement and changing her wound vac. We continued to supplement nutrition with ensure to promote wound healing. She was taken back to the operating room on 05/11/2020 and underwent repeat sternal debridement with bilateral pectoralis flap and sternal closure.  She has 2 JP drains in place which will remove for another week with removal in our office on 05/24/2020. We have consulted case management to assist with placement after the hospital. ID was consulted since she will likely need IV antibiotics for a while to heal her sternum. They felt she will require a minimum 6 weeks of IV Antibiotic with an end date of 2021-08-200.  She will require weekly lab draw of CBC with differential, BMET, CRP, ESR, and Vancomycin  trough.  These results need faxed to [phone].  She is malnourished and will need to continue her nutritional supplements.  She is tolerating a regular diet.  She is medically stable for discharge to SNF today.   Consults: physicial therapy  Significant Diagnostic Studies:   CLINICAL DATA:  Status post open cardiac surgery  EXAM: CHEST  1 VIEW  COMPARISON:  05/05/2020 chest radiograph.  FINDINGS: Left PICC terminates over the cavoatrial junction. Stable cardiomediastinal silhouette with mild cardiomegaly. No pneumothorax. No pleural  effusion. Cephalization of the pulmonary vasculature without overt pulmonary edema. No acute consolidative airspace disease.  IMPRESSION: Stable mild cardiomegaly without overt pulmonary edema. No active pulmonary disease.   Electronically Signed   By: Ilona Sorrel M.D.   On: 05/11/2020 08:54  Treatments:    * STERNAL DEBRIDEMENT AND STERNAL CLOSURE - General  Discharge Exam: Blood pressure (!) 111/45, pulse (!) 58, temperature (!) 97.5 F (36.4 C), temperature source Oral, resp. rate 17, height _0  (1.6 m), weight 65.5 kg, SpO2 97 %.     General appearance: alert, cooperative and no distress Heart: regular rate and rhythm Lungs: clear to auscultation bilaterally Abdomen: benign Extremities: PAS in place Wound: Incisional  VAC in place Discharge Medications:  Discharge Instructions    [facility] pharmacist to adjust dose for Vancomycin, Aminoglycosides and other anti-infective therapies as requested by physician.   Complete by: As directed    [facility] to provide Cath Flo 80m   Complete by: As directed    Administer for PICC line occlusion and as ordered by physician for other access device issues.   Anaphylaxis Kit: Provided to treat any anaphylactic reaction to the medication being provided to the patient if First Dose or when requested by physician   Complete by: As directed    Epinephrine 162mml vial / amp: Administer 0.20m320m0.20ml26mubcutaneously once for moderate to severe anaphylaxis, nurse to call physician and pharmacy when reaction occurs and call 911 if needed for immediate care   Diphenhydramine 50mg47mIV vial: Administer 25-50mg 52mM PRN for first dose reaction, rash, itching, mild reaction, nurse to call physician and pharmacy when reaction occurs   Sodium Chloride 0.9% NS 500ml I65mdminister if needed for hypovolemic blood pressure drop or as ordered by physician after call to physician with anaphylactic reaction   Change  dressing on IV access line weekly and PRN   Complete by: As directed    Change dressing on IV access line weekly and PRN   Complete by: As directed    Discharge patient   Complete by: As directed    [facility] date: 05/15/2020   Flush IV access with Sodium Chloride 0.9% and Heparin 10 units/ml or 100 units/ml   Complete by: As directed    Home infusion instructions - [facility]   Complete by: As directed    Instructions: Flush IV access with Sodium Chloride 0.9% and Heparin 10units/ml or 100units/ml   Change dressing on IV access line: Weekly and PRN   Instructions Cath Flo 2mg: Ad35mister for PICC Line occlusion and as ordered by physician for other access device   [facility] pharmacist to adjust dose for: Vancomycin, Aminoglycosides and other anti-infective therapies as requested by physician   Method of administration may be changed at the discretion of home infusion pharmacist based upon assessment of the patient and/or caregiver's ability to self-administer the medication ordered   Complete by: As  directed      Allergies as of 05/15/2020      Reactions   Bee Venom Swelling   Massive swelling   Penicillins Other (See Comments)   ** tolerates Zosyn + cephalosporins Did it involve swelling of the face/tongue/throat, SOB, or low BP? Unknown Did it involve sudden or severe rash/hives, skin peeling, or any reaction on the inside of your mouth or nose? Unknown Did you need to seek medical attention at a hospital or doctor's office? Unknown When did it last happen?1945 If all above answers are "NO", may proceed with cephalosporin use.   Adhesive [tape] Rash   Morphine And Related Rash   Sulfa Antibiotics Nausea And Vomiting, Rash      Medication List    STOP taking these medications   fluticasone 50 MCG/ACT nasal spray Commonly known as: FLONASE   isosorbide dinitrate 30 MG tablet Commonly  known as: ISORDIL     TAKE these medications   acetaminophen 325 MG tablet Commonly known as: TYLENOL Take 2 tablets (650 mg total) by mouth every 6 (six) hours as needed for mild pain (or Fever >/= 101). What changed:   medication strength  how much to take  when to take this  reasons to take this  additional instructions   aspirin EC 81 MG tablet Take 81 mg by mouth at bedtime.   atorvastatin 80 MG tablet Commonly known as: LIPITOR Take 80 mg by mouth daily.   ceFEPime  IVPB Commonly known as: MAXIPIME Inject 2 g into the vein every 12 (twelve) hours. Indication:  Mediastinitis  First Dose: No Last Day of Therapy:  06/27/20 Labs - Once weekly:  CBC/D and BMP, Labs - Every other week:  ESR and CRP Method of administration: IV Push Method of administration may be changed at the discretion of home infusion pharmacist based upon assessment of the patient and/or caregiver's ability to self-administer the medication ordered.   cholecalciferol 1000 units tablet Commonly known as: VITAMIN D Take 1,000 Units by mouth daily with lunch.   feeding supplement (ENSURE ENLIVE) Liqd Take 237 mLs by mouth 4 (four) times daily.   levothyroxine 200 MCG tablet Commonly known as: SYNTHROID Take 100-200 mcg by mouth See admin instructions. Take 1/2 tablet (100 mcg) by mouth on Monday, Wednesday, Friday mornings; take 1 tablet (200 mcg) on Sunday, Tuesday, Thursday, Saturday mornings  DO NOT USE GENERIC   lisinopril 20 MG tablet Commonly known as: ZESTRIL Take 20 mg by mouth daily.   metoprolol tartrate 25 MG tablet Commonly known as: LOPRESSOR Take 1 tablet (25 mg total) by mouth 2 (two) times daily. What changed: when to take this   prenatal multivitamin Tabs tablet Take 1 tablet by mouth daily with lunch.   rivaroxaban 20 MG Tabs tablet Commonly known as: XARELTO Take 1 tablet (20 mg total) by mouth daily with supper. What changed: when to take this   sertraline 50 MG  tablet Commonly known as: ZOLOFT Take 1 tablet (50 mg total) by mouth at bedtime. What changed: how much to take   traMADol 50 MG tablet Commonly known as: ULTRAM Take 1 tablet (50 mg total) by mouth every 6 (six) hours as needed for severe pain (Pain not mitigated with Tylenol).   vancomycin  IVPB Inject 750 mg into the vein daily. Indication:  Mediastinitis First Dose: Yes Last Day of Therapy:  06/27/2020 Labs - Sunday/Monday:  CBC/D, BMP, and vancomycin trough. Labs - Thursday:  BMP and vancomycin trough Labs -  Every other week:  ESR and CRP Method of administration:Elastomeric Method of administration may be changed at the discretion of the patient and/or caregiver's ability to self-administer the medication ordered.   vitamin B-12 1000 MCG tablet Commonly known as: CYANOCOBALAMIN Take 1,000 mcg by mouth daily with lunch.            Discharge Care Instructions  (From admission, onward)         Start     Ordered   05/15/20 0000  Change dressing on IV access line weekly and PRN  (Home infusion instructions - [facility] )        05/15/20 1119   05/15/20 0000  Change dressing on IV access line weekly and PRN  (Home infusion instructions - [facility] )        05/15/20 1149          Contact information for follow-up providers    [name], MD. Call in 1 day(s).   Specialty: Endocrinology Contact information: [address] [address] [phone]        [name], [name], MD .   Specialties: Internal Medicine, Cardiology, Radiology Contact information: [place] [address] [phone]        [name], MD Follow up on 05/24/2020.   Specialty: Cardiothoracic Surgery Why: Appointment is at 3:00 Contact information: [place] [address] [phone]            Contact information for after-discharge care    Destination    [facility] .   Service: Skilled Nursing Contact information: [place] [phone]                  Signed:  Original Note By: Nicholes Rough PA-C  Updated by Ellwood Handler PA-C  05/15/2020, 12:00 PM

## 2020-05-11 NOTE — Anesthesia Procedure Notes (Signed)
Procedure Name: Intubation Date/Time: 05/11/2020 2:11 PM Performed by: Suzy Bouchard, CRNA Pre-anesthesia Checklist: Patient identified, Emergency Drugs available, Suction available, Patient being monitored and Timeout performed Patient Re-evaluated:Patient Re-evaluated prior to induction Oxygen Delivery Method: Circle system utilized Preoxygenation: Pre-oxygenation with 100% oxygen Induction Type: IV induction Ventilation: Mask ventilation without difficulty Laryngoscope Size: Miller and 2 Grade View: Grade I Tube type: Oral Tube size: 6.5 mm Number of attempts: 1 Airway Equipment and Method: Stylet Placement Confirmation: ETT inserted through vocal cords under direct vision,  positive ETCO2 and breath sounds checked- equal and bilateral Secured at: 21 cm Tube secured with: Tape Dental Injury: Teeth and Oropharynx as per pre-operative assessment

## 2020-05-11 NOTE — Anesthesia Postprocedure Evaluation (Deleted)
Anesthesia Post Note  Patient: [name] [name]  Procedure(s) Performed: STERNAL DEBRIDEMENT AND STERNAL CLOSURE (N/A )     Anesthesia Post Evaluation No complications documented.  Last Vitals:  Vitals:   05/11/20 0929 05/11/20 1147  BP: (!) 122/54 (!) 115/50  Pulse: 62 (!) 56  Resp:  12  Temp:  36.6 C  SpO2:  98%    Last Pain:  Vitals:   05/11/20 1147  TempSrc: Oral  PainSc:                  Ivar Drape

## 2020-05-11 NOTE — Transfer of Care (Signed)
Immediate Anesthesia Transfer of Care Note  Patient: [name] [name]  Procedure(s) Performed: STERNAL DEBRIDEMENT AND STERNAL CLOSURE (N/A )  Patient Location: PACU  Anesthesia Type:General  Level of Consciousness: awake  Airway & Oxygen Therapy: Patient Spontanous Breathing and Patient connected to nasal cannula oxygen  Post-op Assessment: Report given to RN and Post -op Vital signs reviewed and stable  Post vital signs: Reviewed and stable  Last Vitals:  Vitals Value Taken Time  BP 141/59 05/11/20 1557  Temp    Pulse 61 05/11/20 1559  Resp 28 05/11/20 1559  SpO2 100 % 05/11/20 1559  Vitals shown include unvalidated device data.  Last Pain:  Vitals:   05/11/20 1147  TempSrc: Oral  PainSc:          Complications: No complications documented.

## 2020-05-11 NOTE — Consult Note (Signed)
[place] for Infectious Disease    Date of Admission:  05/04/2020     Total days of antibiotics 8  Day 8 vancomycin + cefepime               Reason for Consult: Sternal wound infection     Referring Provider: Blanchard Mane Primary Care Provider: [name], MD    Assessment: [name] [name] is a 79 y.o. female with deep sternal wound infection due to MRSA and Pseudomonas. She has evidence of non-union of sternum and sternal wires have been removed during sequential trips to the OR for wound exploration and vac changes. She is currently in the OR. Plan as discussed with TCTS PA is that she is going to SNF. She has a PICC documented to be in place since 6/21.    Presume she would need longer course of IV antimicrobials, 4-6 weeks. There are no reliable PO options for the pseudomonas isolate unfortunately.   PCN allergy = Seems to be doing well on cephalosporin therapy and has tolerated zosyn in the past. Will D/W pharmacy about amoxicillin challenge next week if she is still here.     Plan: 1. Continue IV vancomycin  2. Continue IV cefepime   Will finalize plans early next week to get her with our OPAT team if she will be here a few days to prepare for SNF departure.       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 [MAR Hold] aspirin EC  81 mg Oral QHS  . [MAR Hold] atorvastatin  80 mg Oral Daily  . chlorhexidine      . [MAR Hold] Chlorhexidine Gluconate Cloth  6 each Topical Daily  . [MAR Hold] feeding supplement (ENSURE ENLIVE)  237 mL Oral QID  . [MAR Hold] fluticasone  2 spray Each Nare Daily  . [MAR Hold] levothyroxine  100 mcg Oral Once per day on Mon Wed Fri  . [MAR Hold] levothyroxine  200 mcg Oral Once per day on Sun Tue Thu Sat  . [MAR Hold] lisinopril  20 mg Oral Daily  .  [MAR Hold] metoprolol tartrate  25 mg Oral BID PC  . [MAR Hold] multivitamin with minerals  1 tablet Oral Q1200  . [MAR Hold] rivaroxaban  20 mg Oral Q supper  . [MAR Hold] sertraline  25 mg Oral QHS  . [MAR Hold] sodium chloride flush  10-40 mL Intracatheter Q12H  . [MAR Hold] sodium chloride flush  10-40 mL Intracatheter Q12H  . [MAR Hold] sodium chloride flush  3 mL Intravenous Q12H    HPI: [name] [name] is a 79 y.o. female directly admitted from cardiothoracic surgery office for management of draining, infected sternotomy incision.   PMHx. 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   [name] [name] underwent bypass surgery January 27, 2020 and unfortunately suffered a stroke on post op day 2 requiring a lengthy ICU stay. She was discharged home healing well after about 3 weeks in the hospital.   She was seen April 19th and was doing quite well at that time given the complications following her surgery and wound was healing well. She was seen by PA June 1st for evaluation of sternal chest pain  that had started about 5-6 days prior to that encounter. She had no fever at that time but found to have leukocytosis during ER visit where she was diagnosed with klebsiella UTI and started on cephalexin. CT scan of the chest was arranged and she continued cephalexin with increased wound care frequency.   04/19/2020 Chest CT IMPRESSION: 1. Ill-defined retrosternal anterior mediastinal collection of fluid and gas, as described. Overall size of the collection has diminished slightly from preoperative CT 04/17/2020. Inferior extent abuts the anterior pericardium adjacent to the right ventricle and adjacent epicardial leads. 2. Anterior wound VAC in place with small amount of ill-defined fluid and air in the anterior parasternal subcutaneous tissues. Small inferior fluid collection measures 2 x 1.9 cm. 3. Prior median sternotomy with  nonunion.  She was admitted 6/04 for sternal wound debridement of her sternum. She was observed overnight after wound vac was placed following debridement and sent home given the dehiscence was superficial. Follow up check in office indicates that she was doing well off antibiotics and on wound VAC therapy - some fibrinous debris at the wound base. This was debrided in the office.   Follow up visit later that week 6/18 indicates that she continued to have purulent drainage at that time. She was again re-admitted and taken back to the OR 6/19. Swab cultures revealed MRSA but intraoperative tissue grew out MRSA and Pseudomonas that is resistant to quinolones. Third debridement done 6/22 with removal of sternal wires and fourth debridement today with findings pending.    Review of Systems: Patient was not interviewed - chart reviewed     Past Medical History:  Diagnosis Date  . Coronary artery disease   . Family history of adverse reaction to anesthesia    " My Sister did "  . HTN (hypertension)   . Hyperlipidemia   . Hypothyroidism   . Osteoporosis   . Stroke ([place])   . Thyroid carcinoma ([place])     Social History   Tobacco Use  . Smoking status: Never Smoker  . Smokeless tobacco: Never Used  Vaping Use  . Vaping Use: Never used  Substance Use Topics  . Alcohol use: Not Currently  . Drug use: Never    Family History  Problem Relation Age of Onset  . Breast cancer Mother 57  . Hypertension Mother   . Diabetes Mother   . Heart disease Mother   . Cirrhosis Father   . Cancer Brother   . Heart disease Brother   . Heart attack Sister    Allergies  Allergen Reactions  . Bee Venom Swelling    Massive swelling  . Penicillins Other (See Comments)    ** tolerates Zosyn + cephalosporins Did it involve swelling of the face/tongue/throat, SOB, or low BP? Unknown Did it involve sudden or severe rash/hives, skin peeling, or any reaction on the inside of your mouth or nose?  Unknown Did you need to seek medical attention at a hospital or doctor's office? Unknown When did it last happen?1945 If all above answers are "NO", may proceed with cephalosporin use.  . Adhesive [Tape] Rash  . Morphine And Related Rash  . Sulfa Antibiotics Nausea And Vomiting and Rash    OBJECTIVE: Blood pressure (!) 115/50, pulse (!) 56, temperature 97.8 F (36.6 C), temperature source Oral, resp. rate 12, height 5\' 3"  (1.6 m), weight 63.7 kg, SpO2 98 %.    Lab Results Lab Results  Component Value Date   WBC 15.2 (H) 05/10/2020  HGB 8.3 (L) 05/10/2020   HCT 27.0 (L) 05/10/2020   MCV 83.3 05/10/2020   PLT 327 05/10/2020    Lab Results  Component Value Date   CREATININE 0.66 05/10/2020   BUN 21 05/10/2020   NA 136 05/10/2020   K 4.0 05/10/2020   CL 104 05/10/2020   CO2 26 05/10/2020    Lab Results  Component Value Date   ALT 218 (H) 05/04/2020   AST 182 (H) 05/04/2020   ALKPHOS 74 05/04/2020   BILITOT 0.4 05/04/2020     Microbiology: Recent Results (from the past 240 hour(s))  MRSA PCR Screening     Status: None   Collection Time: 05/05/20  1:10 AM   Specimen: Nasopharyngeal  Result Value Ref Range Status   MRSA by PCR NEGATIVE NEGATIVE Final    Comment:        The GeneXpert MRSA Assay (FDA approved for NASAL specimens only), is one component of a comprehensive MRSA colonization surveillance program. It is not intended to diagnose MRSA infection nor to guide or monitor treatment for MRSA infections. Performed at [facility], [place] [address] [place], [place] [address]   SARS Coronavirus 2 by RT PCR (hospital order, performed in [facility] hospital lab) Nasopharyngeal Nasopharyngeal Swab     Status: None   Collection Time: 05/05/20  9:16 AM   Specimen: Nasopharyngeal Swab  Result Value Ref Range Status   SARS Coronavirus 2 NEGATIVE NEGATIVE Final    Comment: (NOTE) SARS-CoV-2 target nucleic acids are NOT DETECTED.  The SARS-CoV-2 RNA  is generally detectable in upper and lower respiratory specimens during the acute phase of infection. The lowest concentration of SARS-CoV-2 viral copies this assay can detect is 250 copies / mL. A negative result does not preclude SARS-CoV-2 infection and should not be used as the sole basis for treatment or other patient management decisions.  A negative result may occur with improper specimen collection / handling, submission of specimen other than nasopharyngeal swab, presence of viral mutation(s) within the areas targeted by this assay, and inadequate number of viral copies (<250 copies / mL). A negative result must be combined with clinical observations, patient history, and epidemiological information.  Fact Sheet for Patients:   StrictlyIdeas.no  Fact Sheet for Healthcare Providers: BankingDealers.co.za  This test is not yet approved or  cleared by the [place] FDA and has been authorized for detection and/or diagnosis of SARS-CoV-2 by FDA under an Emergency Use Authorization (EUA).  This EUA will remain in effect (meaning this test can be used) for the duration of the COVID-19 declaration under Section 564(b)(1) of the Act, 21 U.S.C. section 360bbb-3(b)(1), unless the authorization is terminated or revoked sooner.  Performed at [facility], [place] [address], [place], [place] [address]   Aerobic/Anaerobic Culture (surgical/deep wound)     Status: None   Collection Time: 05/05/20 11:37 AM   Specimen: Wound  Result Value Ref Range Status   Specimen Description WOUND TISSUE  Final   Special Requests STERNAL INFECTION  Final   Gram Stain NO WBC SEEN NO ORGANISMS SEEN   Final   Culture   Final    FEW METHICILLIN RESISTANT STAPHYLOCOCCUS AUREUS RARE PSEUDOMONAS AERUGINOSA NO ANAEROBES ISOLATED Performed at [facility], [address], [place],  [address]    Report Status 05/10/2020 FINAL  Final    Organism ID, Bacteria METHICILLIN RESISTANT STAPHYLOCOCCUS AUREUS  Final   Organism ID, Bacteria PSEUDOMONAS AERUGINOSA  Final  Susceptibility   Methicillin resistant staphylococcus aureus - MIC*    CIPROFLOXACIN >=8 RESISTANT Resistant     ERYTHROMYCIN >=8 RESISTANT Resistant     GENTAMICIN <=0.5 SENSITIVE Sensitive     OXACILLIN >=4 RESISTANT Resistant     TETRACYCLINE <=1 SENSITIVE Sensitive     VANCOMYCIN 1 SENSITIVE Sensitive     TRIMETH/SULFA <=10 SENSITIVE Sensitive     CLINDAMYCIN <=0.25 SENSITIVE Sensitive     RIFAMPIN <=0.5 SENSITIVE Sensitive     Inducible Clindamycin NEGATIVE Sensitive     * FEW METHICILLIN RESISTANT STAPHYLOCOCCUS AUREUS   Pseudomonas aeruginosa - MIC*    CEFTAZIDIME 4 SENSITIVE Sensitive     CIPROFLOXACIN 2 INTERMEDIATE Intermediate     GENTAMICIN <=1 SENSITIVE Sensitive     IMIPENEM 2 SENSITIVE Sensitive     PIP/TAZO 8 SENSITIVE Sensitive     CEFEPIME 2 SENSITIVE Sensitive     * RARE PSEUDOMONAS AERUGINOSA  Aerobic/Anaerobic Culture (surgical/deep wound)     Status: None   Collection Time: 05/05/20 11:40 AM   Specimen: Wound  Result Value Ref Range Status   Specimen Description WOUND  Final   Special Requests STERNAL INFECTION SENT ON SWABS  Final   Gram Stain   Final    RARE WBC PRESENT, PREDOMINANTLY MONONUCLEAR NO ORGANISMS SEEN    Culture   Final    FEW METHICILLIN RESISTANT STAPHYLOCOCCUS AUREUS NO ANAEROBES ISOLATED Performed at [facility], [address] [place], [place] [address]    Report Status 05/10/2020 FINAL  Final   Organism ID, Bacteria METHICILLIN RESISTANT STAPHYLOCOCCUS AUREUS  Final      Susceptibility   Methicillin resistant staphylococcus aureus - MIC*    CIPROFLOXACIN >=8 RESISTANT Resistant     ERYTHROMYCIN >=8 RESISTANT Resistant     GENTAMICIN <=0.5 SENSITIVE Sensitive     OXACILLIN >=4 RESISTANT Resistant     TETRACYCLINE <=1 SENSITIVE Sensitive     VANCOMYCIN <=0.5 SENSITIVE Sensitive      TRIMETH/SULFA <=10 SENSITIVE Sensitive     CLINDAMYCIN <=0.25 SENSITIVE Sensitive     RIFAMPIN <=0.5 SENSITIVE Sensitive     Inducible Clindamycin NEGATIVE Sensitive     * FEW METHICILLIN RESISTANT STAPHYLOCOCCUS AUREUS     Janene Madeira, MSN, NP-C [place] for Infectious Disease [email or web address] Pager: [phone] Office: [place]: [phone]   05/11/2020 2:14 PM

## 2020-05-12 LAB — BASIC METABOLIC PANEL
Anion gap: 8 (ref 5–15)
BUN: 21 mg/dL (ref 8–23)
CO2: 24 mmol/L (ref 22–32)
Calcium: 10.1 mg/dL (ref 8.9–10.3)
Chloride: 103 mmol/L (ref 98–111)
Creatinine, Ser: 0.71 mg/dL (ref 0.44–1.00)
GFR calc Af Amer: >60 mL/min (ref >60)
GFR calc non Af Amer: >60 mL/min (ref >60)
Glucose, Bld: 128 mg/dL — ABNORMAL HIGH (ref 70–99)
Potassium: 4.8 mmol/L (ref 3.5–5.1)
Sodium: 135 mmol/L (ref 135–145)

## 2020-05-12 NOTE — TOC Progression Note (Signed)
Transition of Care [facility]) - Progression Note    Patient Details  Name: JASLYNN THOME MRN: [id number] Date of Birth: 1940-12-28  Transition of Care [facility]) CM/SW [place], [place] Phone Number: 05/12/2020, 11:51 AM  Clinical Narrative:     CSW visit with patient at bedside. CSW introduced self and explained role. CSW discussed PT recommendation of short  term rehab at [facility] before discharge home. Patient confirmed she is agreeable to short term rehab at [facility]. CSW explained the SNF process. CSW gave patient bed offers along with medicare.gov SNF listing. CSW requested patient review and select 1-2 SNF choices for placement consideration. Patient states she understands and states no questions or concerns that this time.    CSW will follow up on bed choice tomorrow and start insurance authorization tomorrow.  Thurmond Butts, MSW, [place] Clinical Social Worker    Expected Discharge Plan: Skilled Nursing Facility Barriers to Discharge: SNF Pending bed offer, Continued Medical Work up, Ship broker  Expected Discharge Plan and Services Expected Discharge Plan: [place] In-house Referral: Clinical Social Work     Living arrangements for the past 2 months: Single Family Home                                       Social Determinants of Health (SDOH) Interventions    Readmission Risk Interventions No flowsheet data found.

## 2020-05-12 NOTE — Progress Notes (Signed)
Pharmacy Antibiotic Note  [name] [name] is a 80 y.o. female admitted on 05/04/2020 with sternal wound infection S/P CABG   Pharmacy has been consulted for cefepime and vancomycin dosing for wound infection. Per ID notes, plans are for 6 weeks of antibiotics -SCr= 0.71   Plan: Continue vancomycin 500 mg IV every 12 hours Monitor renal function length of therapy and ability to transition to PO  Vancomycin trough as needed   Height: 5\' 3"  (160 cm) Weight: 63.7 kg (140 lb 6.9 oz) IBW/kg (Calculated) : 52.4  Temp (24hrs), Avg:97.7 F (36.5 C), Min:97.4 F (36.3 C), Max:97.8 F (36.6 C)  Recent Labs  Lab 05/06/20 0738 05/06/20 1054 05/08/20 0525 05/09/20 0908 05/10/20 0630 05/11/20 1541 05/12/20 0415  WBC  --  11.9*  --   --  15.2*  --   --   CREATININE 0.56  --  0.76  --  0.66 0.60 0.71  VANCOTROUGH  --   --   --  16  --   --   --     Estimated Creatinine Clearance: 53.7 mL/min (by C-G formula based on SCr of 0.71 mg/dL).    Allergies  Allergen Reactions  . Bee Venom Swelling    Massive swelling  . Penicillins Other (See Comments)    ** tolerates Zosyn + cephalosporins Did it involve swelling of the face/tongue/throat, SOB, or low BP? Unknown Did it involve sudden or severe rash/hives, skin peeling, or any reaction on the inside of your mouth or nose? Unknown Did you need to seek medical attention at a hospital or doctor's office? Unknown When did it last happen?1945 If all above answers are "NO", may proceed with cephalosporin use.  . Adhesive [Tape] Rash  . Morphine And Related Rash  . Sulfa Antibiotics Nausea And Vomiting and Rash    Antimicrobials this admission: 6/18 vancomycin >> 6/18 cefepime >>   Microbiology results: Wound cx 6/19 >> few staph aureus Wound cx 6/19 >> few staph aureus, rare pseudomonas MRSA PCR neg  [name] [name], PharmD Clinical Pharmacist **Pharmacist phone directory can now be found on [email or web address] ([email or web address]).  Listed under [place].

## 2020-05-12 NOTE — Progress Notes (Signed)
      [address]       [place] [address]             [phone]      1 Day Post-Op Procedure(s) (LRB): STERNAL DEBRIDEMENT AND STERNAL CLOSURE (N/A)   Subjective:  Awoken from sleep, no complaints.  States doing okay. Denies pain  Objective: Vital signs in last 24 hours: Temp:  [97.4 F (36.3 C)-97.8 F (36.6 C)] 97.4 F (36.3 C) (06/25 2139) Pulse Rate:  [56-62] 61 (06/26 0000) Cardiac Rhythm: Normal sinus rhythm (06/25 1902) Resp:  [11-28] 11 (06/26 0000) BP: (115-154)/(50-60) 129/51 (06/26 0000) SpO2:  [97 %-100 %] 98 % (06/26 0000)  Intake/Output from previous day: 06/25 0701 - 06/26 0700 In: 1230 [P.O.:120; I.V.:500; IV Piggyback:610] Out: 575 [Urine:375; Blood:200]  General appearance: alert, cooperative and no distress Heart: regular rate and rhythm Lungs: clear to auscultation bilaterally Abdomen: soft, non-tender; bowel sounds normal; no masses,  no organomegaly Extremities: extremities normal, atraumatic, no cyanosis or edema Wound: pravena wound vac in place on sternum  Lab Results: Recent Labs    05/10/20 0630 05/11/20 1541  WBC 15.2*  --   HGB 8.3* 8.2*  HCT 27.0* 24.0*  PLT 327  --    BMET:  Recent Labs    05/10/20 0630 05/10/20 0630 05/11/20 1541 05/12/20 0415  NA 136   < > 135 135  K 4.0   < > 4.5 4.8  CL 104   < > 102 103  CO2 26  --   --  24  GLUCOSE 117*   < > 102* 128*  BUN 21   < > 26* 21  CREATININE 0.66   < > 0.60 0.71  CALCIUM 9.6  --   --  10.1   < > = values in this interval not displayed.    PT/INR: No results for input(s): LABPROT, INR in the last 72 hours. ABG    Component Value Date/Time   PHART 7.455 (H) 05/05/2020 0018   HCO3 24.4 05/05/2020 0018   TCO2 27 05/11/2020 1541   ACIDBASEDEF 2.0 01/28/2020 1545   O2SAT 96.0 05/05/2020 0018   CBG (last 3)  No results for input(s): GLUCAP in the last 72 hours.  Assessment/Plan: S/P Procedure(s) (LRB): STERNAL DEBRIDEMENT AND STERNAL CLOSURE  (N/A)  1. S/P Sternal wound debridement with flap closure- JP drain in place with 200 cc output, leave in place until dry.. continue pravena 2. CV- Sinus Bradycardia on Lopressor, Lisinopril 3. Pulm- no acute issues, continue IS 4. ID- OR cultures grew MRSA, Pseudomonas- on IV ABX, per ID recommendations will continue therapy for 6 weeks, picc line is in place 5. Deconditioning- patient lives alone is frail, needs SNF placement 6. Dispo- patient stable, leave JP drain in place until dry, continue pravena wound vac for now, continue IV ABX, tentatively plan for d/c to SNF Monday if bed is available   LOS: 8 days    Ellwood Handler, PA-C 05/12/2020

## 2020-05-13 ENCOUNTER — Inpatient Hospital Stay (HOSPITAL_COMMUNITY): Payer: PPO

## 2020-05-13 LAB — BASIC METABOLIC PANEL
Anion gap: 9 (ref 5–15)
BUN: 35 mg/dL — ABNORMAL HIGH (ref 8–23)
CO2: 25 mmol/L (ref 22–32)
Calcium: 10 mg/dL (ref 8.9–10.3)
Chloride: 102 mmol/L (ref 98–111)
Creatinine, Ser: 1.01 mg/dL — ABNORMAL HIGH (ref 0.44–1.00)
GFR calc Af Amer: >60 mL/min (ref >60)
GFR calc non Af Amer: 54 mL/min — ABNORMAL LOW (ref >60)
Glucose, Bld: 112 mg/dL — ABNORMAL HIGH (ref 70–99)
Potassium: 4.8 mmol/L (ref 3.5–5.1)
Sodium: 136 mmol/L (ref 135–145)

## 2020-05-13 LAB — CBC
HCT: 26.7 % — ABNORMAL LOW (ref 36.0–46.0)
Hemoglobin: 8.2 g/dL — ABNORMAL LOW (ref 12.0–15.0)
MCH: 25.9 pg — ABNORMAL LOW (ref 26.0–34.0)
MCHC: 30.7 g/dL (ref 30.0–36.0)
MCV: 84.5 fL (ref 80.0–100.0)
Platelets: 390 10*3/uL (ref 150–400)
RBC: 3.16 MIL/uL — ABNORMAL LOW (ref 3.87–5.11)
RDW: 15.9 % — ABNORMAL HIGH (ref 11.5–15.5)
WBC: 18.5 10*3/uL — ABNORMAL HIGH (ref 4.0–10.5)
nRBC: 0 % (ref 0.0–0.2)

## 2020-05-13 LAB — URINE CULTURE: Culture: NO GROWTH

## 2020-05-13 MED ORDER — TRAMADOL HCL 50 MG PO TABS
50.0000 mg | ORAL_TABLET | ORAL | Status: DC | PRN
  Administered 2020-05-13 – 2020-05-15 (×3): 50 mg via ORAL
  Filled 2020-05-13 (×3): qty 1

## 2020-05-13 NOTE — TOC Progression Note (Signed)
Transition of Care [facility]) - Progression Note    Patient Details  Name: [name] [name] MRN: [id number] Date of Birth: 16-Feb-1941  Transition of Care [facility]) CM/SW [place], [place] Phone Number: 05/13/2020, 5:45 PM  Clinical Narrative:    CSW started insurance authorization with [employer], as well as ambulance transport authorization. CSW informed by insurance representative that [facility] is not in-network, but [facility] is in-network. CSW informed [employer] that the SNF choice will be provided once confirmed.   Expected Discharge Plan: [place] Barriers to Discharge: SNF Pending bed offer, Continued Medical Work up, Ship broker  Expected Discharge Plan and Services Expected Discharge Plan: [place] In-house Referral: Clinical Social Work     Living arrangements for the past 2 months: Single Family Home                                       Social Determinants of Health (SDOH) Interventions    Readmission Risk Interventions No flowsheet data found.

## 2020-05-13 NOTE — TOC Progression Note (Signed)
Transition of Care [facility]) - Progression Note    Patient Details  Name: AME HEAGLE MRN: [id number] Date of Birth: [birth year]  Transition of Care [facility]) CM/SW [place], [place] Phone Number: 05/13/2020, 1:13 PM  Clinical Narrative:     CSW visit with patient at bedside. Patient confirmed she wants [facility]F and [facility] as second choice.   Thurmond Butts, MSW, [place] Clinical Social Worker     Expected Discharge Plan: Skilled Nursing Facility Barriers to Discharge: SNF Pending bed offer, Continued Medical Work up, Ship broker  Expected Discharge Plan and Services Expected Discharge Plan: [place] In-house Referral: Clinical Social Work     Living arrangements for the past 2 months: Single Family Home                                       Social Determinants of Health (SDOH) Interventions    Readmission Risk Interventions No flowsheet data found.

## 2020-05-13 NOTE — Progress Notes (Signed)
      [address]       [place] [address]             [phone]      2 Days Post-Op Procedure(s) (LRB): STERNAL DEBRIDEMENT AND STERNAL CLOSURE (N/A)   Subjective:  Patient with sternal pain this morning.  She ate much better yesterday after her diet was changed.    Objective: Vital signs in last 24 hours: Temp:  [97.6 F (36.4 C)-98.4 F (36.9 C)] 97.6 F (36.4 C) (06/27 0757) Pulse Rate:  [60-71] 62 (06/27 0757) Cardiac Rhythm: Normal sinus rhythm (06/27 0701) Resp:  [15-18] 15 (06/27 0757) BP: (101-125)/(39-49) 123/42 (06/27 0757) SpO2:  [95 %-99 %] 97 % (06/27 0757) Weight:  [64.8 kg] 64.8 kg (06/27 0459)  Intake/Output from previous day: 06/26 0701 - 06/27 0700 In: 86 [P.O.:480; I.V.:10] Out: 725 [Urine:725]  General appearance: alert, cooperative and no distress Heart: regular rate and rhythm Lungs: clear to auscultation bilaterally Abdomen: soft, non-tender; bowel sounds normal; no masses,  no organomegaly Extremities: extremities normal, atraumatic, no cyanosis or edema Wound: pravena in place, JP drains remain  Lab Results: Recent Labs    05/11/20 1541 05/13/20 0236  WBC  --  18.5*  HGB 8.2* 8.2*  HCT 24.0* 26.7*  PLT  --  390   BMET:  Recent Labs    05/12/20 0415 05/13/20 0236  NA 135 136  K 4.8 4.8  CL 103 102  CO2 24 25  GLUCOSE 128* 112*  BUN 21 35*  CREATININE 0.71 1.01*  CALCIUM 10.1 10.0    PT/INR: No results for input(s): LABPROT, INR in the last 72 hours. ABG    Component Value Date/Time   PHART 7.455 (H) 05/05/2020 0018   HCO3 24.4 05/05/2020 0018   TCO2 27 05/11/2020 1541   ACIDBASEDEF 2.0 01/28/2020 1545   O2SAT 96.0 05/05/2020 0018   CBG (last 3)  No results for input(s): GLUCAP in the last 72 hours.  Assessment/Plan: S/P Procedure(s) (LRB): STERNAL DEBRIDEMENT AND STERNAL CLOSURE (N/A)  1. CV- Sinus Bradycardia- continue Lopressor, Lisinopril 2. Pulm- no acute issues 3. Malnutrition-  changed diet to regular diet patient ate almost entire tray, continue supplements and patients regular diet order 4. ID- MRSA/Pseudomonas- on IV ABX , will need 6 weeks of therapy 5. S/P [place] Flap-pravena in place, JP drains  6. Dispo- patient stable, continue current care, will need SNF placement once arrangements can be finalized   LOS: 9 days    Ellwood Handler, PA-C  05/13/2020

## 2020-05-14 ENCOUNTER — Encounter (HOSPITAL_COMMUNITY): Payer: Self-pay | Admitting: Cardiothoracic Surgery

## 2020-05-14 DIAGNOSIS — S21101A Unspecified open wound of right front wall of thorax without penetration into thoracic cavity, initial encounter: Secondary | ICD-10-CM

## 2020-05-14 DIAGNOSIS — L089 Local infection of the skin and subcutaneous tissue, unspecified: Secondary | ICD-10-CM

## 2020-05-14 LAB — BASIC METABOLIC PANEL
Anion gap: 6 (ref 5–15)
BUN: 37 mg/dL — ABNORMAL HIGH (ref 8–23)
CO2: 25 mmol/L (ref 22–32)
Calcium: 10.3 mg/dL (ref 8.9–10.3)
Chloride: 105 mmol/L (ref 98–111)
Creatinine, Ser: 0.99 mg/dL (ref 0.44–1.00)
GFR calc Af Amer: >60 mL/min (ref >60)
GFR calc non Af Amer: 55 mL/min — ABNORMAL LOW (ref >60)
Glucose, Bld: 111 mg/dL — ABNORMAL HIGH (ref 70–99)
Potassium: 5.2 mmol/L — ABNORMAL HIGH (ref 3.5–5.1)
Sodium: 136 mmol/L (ref 135–145)

## 2020-05-14 LAB — SARS CORONAVIRUS 2 (TAT 6-24 HRS): SARS Coronavirus 2: NEGATIVE

## 2020-05-14 LAB — VANCOMYCIN, TROUGH: Vancomycin Tr: 24 ug/mL (ref 15–20)

## 2020-05-14 MED ORDER — ACETAMINOPHEN 325 MG PO TABS: 650.0000 mg | ORAL_TABLET | Freq: Four times a day (QID) | ORAL | Status: DC | PRN

## 2020-05-14 MED ORDER — VANCOMYCIN HCL 500 MG/100ML IV SOLN: 500.0000 mg | Freq: Two times a day (BID) | INTRAVENOUS | Status: DC

## 2020-05-14 MED ORDER — VANCOMYCIN HCL 750 MG/150ML IV SOLN
750.0000 mg | INTRAVENOUS | Status: DC
  Administered 2020-05-15: 750 mg via INTRAVENOUS
  Filled 2020-05-14: qty 150

## 2020-05-14 MED ORDER — ENSURE ENLIVE PO LIQD: 237.0000 mL | Freq: Four times a day (QID) | ORAL | 12 refills | Status: DC

## 2020-05-14 MED ORDER — SODIUM CHLORIDE 0.9 % IV SOLN: 2.0000 g | Freq: Two times a day (BID) | INTRAVENOUS | Status: DC

## 2020-05-14 NOTE — TOC Progression Note (Signed)
Transition of Care [facility]) - Progression Note    Patient Details  Name: [name] [name] MRN: [id number] Date of Birth: 04-May-1941  Transition of Care [facility]) CM/SW [place], [place] Phone Number: 05/14/2020, 3:46 PM  Clinical Narrative:     CSW received insurance authorization for SNF [phone]. Good for 7 days, patient must be placed within 5 days. Ambulance authorization [id number], good for 90 days.  Expected Discharge Plan: [place] Barriers to Discharge: SNF Pending bed offer, Continued Medical Work up, Ship broker  Expected Discharge Plan and Services Expected Discharge Plan: [place] In-house Referral: Clinical Social Work     Living arrangements for the past 2 months: Single Family Home                                       Social Determinants of Health (SDOH) Interventions    Readmission Risk Interventions No flowsheet data found.

## 2020-05-14 NOTE — NC FL2 (Signed)
[place] MEDICAID FL2 LEVEL OF CARE SCREENING TOOL     IDENTIFICATION  Patient Name: [name] [name] Birthdate: July 13, 1941 Sex: female Admission Date (Current Location): 05/04/2020  [facility] and [place] Number:  Herbalist and Address:  [facility]. [facility], [place] [address], [place], [place] [address]      Provider Number: [id number]  Attending Physician Name and Address:  [name], MD  Relative Name and Phone Number:       Current Level of Care: SNF Recommended Level of Care: [place] Prior Approval Number:    Date Approved/Denied:   PASRR Number: [id number]  Discharge Plan: SNF    Current Diagnoses: Patient Active Problem List   Diagnosis Date Noted  . MRSA infection 05/11/2020  . Pseudomonas infection 05/11/2020  . 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 Normocytic anemia 05/11/2020  . History of pulmonary embolus (PE) 05/11/2020  . Malnutrition of moderate degree 05/09/2020  . Pressure injury of skin 05/04/2020  . Mediastinitis 05/04/2020  . PFO (patent foramen ovale) 04/30/2020  . Sternal wound infection 04/20/2020  . Subglottic edema   . History of open heart surgery   . Pleural effusion on right   . Status post tracheostomy ([place])   . History of CVA (cerebrovascular accident)   . History of thyroid cancer   . Leukocytosis   . Acute blood loss anemia   . Acute hypoxemic respiratory failure ([place])   . Tracheostomy status ([place])   . 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 3-vessel CAD 01/26/2020  . Coronary artery disease of native artery of native heart with stable angina pectoris ([place])   . Precordial chest pain 06/15/2014  . Carotid stenosis 06/15/2014  . Essential hypertension, benign 06/15/2014    Orientation RESPIRATION BLADDER Height & Weight     Self, Place  Normal External catheter, Continent Weight: 135 lb 2.3 oz (61.3 kg) Height:  5\' 3"  (160 cm)  BEHAVIORAL SYMPTOMS/MOOD NEUROLOGICAL BOWEL  NUTRITION STATUS      Continent Diet (please see discharge summary)  AMBULATORY STATUS COMMUNICATION OF NEEDS Skin     Verbally Surgical wounds (closed chest incision,stage 1 buttocks right;left,neagtive pressure wound therapy sternum, RT chest bulb, left chest bulb)                       Personal Care Assistance Level of Assistance  Bathing, Dressing, Feeding Bathing Assistance: Maximum assistance Feeding assistance: Independent Dressing Assistance: Maximum assistance     Functional Limitations Info  Sight, Hearing, Speech Sight Info: Adequate Hearing Info: Adequate (difficulty hearing) Speech Info: Adequate    SPECIAL CARE FACTORS FREQUENCY  PT (By licensed PT), OT (By licensed OT) (prevena wound vac, longterm IV abx)     PT Frequency: 5x per week OT Frequency: 5x per week            Contractures Contractures Info: Not present    Additional Factors Info  Code Status, Allergies Code Status Info: FULL Allergies Info: Bee Venom,Penicillins,Adhesive tape,Morphine And RelatedSulfa Antibiotics           Current Medications (05/14/2020):  This is the current hospital active medication list Current Facility-Administered Medications  Medication Dose Route Frequency Provider Last Rate Last Admin  . acetaminophen (TYLENOL) tablet 650 mg  650 mg Oral Q6H PRN [name], PA-C   650 mg at 05/13/20 0940   Or  . acetaminophen (TYLENOL) suppository 650 mg  650 mg Rectal Q6H PRN  [name], PA-C      . aspirin EC tablet 81 mg  81 mg Oral QHS [name], [name]   81 mg at 05/13/20 2209  . atorvastatin (LIPITOR) tablet 80 mg  80 mg Oral Daily [name], PA-C   80 mg at 05/13/20 0940  . ceFEPIme (MAXIPIME) 2 g in sodium chloride 0.9 % 100 mL IVPB  2 g Intravenous Q12H [name], MD 200 mL/hr at 05/14/20 1108 2 g at 05/14/20 1108  . Chlorhexidine Gluconate Cloth 2 % PADS 6 each  6 each Topical Daily [name], MD   6 each at 05/14/20 1113  . dextrose  5% lactated ringers 1,000 mL with potassium chloride 10 mEq infusion   Intravenous Continuous Nani Skillern, PA-C 10 mL/hr at 05/14/20 1509 New Bag at 05/14/20 1509  . feeding supplement (ENSURE ENLIVE) (ENSURE ENLIVE) liquid 237 mL  237 mL Oral QID [name], MD   237 mL at 05/14/20 1348  . fluticasone (FLONASE) 50 MCG/ACT nasal spray 2 spray  2 spray Each Nare Daily [name], PA-C   2 spray at 05/10/20 0847  . levothyroxine (SYNTHROID) tablet 100 mcg  100 mcg Oral Once per day on Mon Wed Fri [name], [name], PA-C   100 mcg at 05/11/20 0536  . levothyroxine (SYNTHROID) tablet 200 mcg  200 mcg Oral Once per day on Sun Tue Thu Sat [name], MD   200 mcg at 05/13/20 5102  . lisinopril (ZESTRIL) tablet 20 mg  20 mg Oral Daily Nicholes Rough N, PA-C   20 mg at 05/11/20 0932  . metoprolol tartrate (LOPRESSOR) tablet 25 mg  25 mg Oral BID PC [name], [name], PA-C   25 mg at 05/12/20 1231  . multivitamin with minerals tablet 1 tablet  1 tablet Oral Q1200 [name], MD   1 tablet at 05/13/20 1200  . ondansetron (ZOFRAN) tablet 4 mg  4 mg Oral Q6H PRN [name], [name], PA-C       Or  . ondansetron [facility]) injection 4 mg  4 mg Intravenous Q6H PRN [name], [name], PA-C      . rivaroxaban (XARELTO) tablet 20 mg  20 mg Oral Q supper [name], [name], PA-C   20 mg at 05/13/20 1709  . senna-docusate (Senokot-S) tablet 1 tablet  1 tablet Oral QHS PRN [name], [name], PA-C      . sertraline (ZOLOFT) tablet 25 mg  25 mg Oral QHS [name], [name], PA-C   25 mg at 05/13/20 2208  . sodium chloride flush (NS) 0.9 % injection 10-40 mL  10-40 mL Intracatheter Q12H [name], MD   10 mL at 05/13/20 2213  . sodium chloride flush (NS) 0.9 % injection 10-40 mL  10-40 mL Intracatheter PRN [name], MD   10 mL at 05/09/20 2127  . sodium chloride flush (NS) 0.9 % injection 3 mL  3 mL Intravenous Q12H [name], [name], PA-C   3 mL at 05/14/20 1105  . traMADol (ULTRAM) tablet 50 mg  50 mg  Oral Q4H PRN Barrett, Erin R, PA-C   50 mg at 05/13/20 1709  . [START ON 05/15/2020] vancomycin (VANCOREADY) IVPB 750 mg/150 mL  750 mg Intravenous Q24H [place] Jude, [facility]         Discharge Medications: Please see discharge summary for a list of discharge medications.  Relevant Imaging Results:  Relevant Lab Results:  Additional Information SSN [id number]  Vinie Sill, LCSWA

## 2020-05-14 NOTE — Progress Notes (Signed)
Attempted to give meds but Pt refused taking meds PO in the morning. Education given to pt regarding of benefit and each med works on her body. Pt stated "these meds has been messed me up".   Lavenia Atlas, RN

## 2020-05-14 NOTE — Progress Notes (Signed)
Patient ID: [name] [name], female   DOB: [birth year], 79 y.o.   MRN: [id number]         [place] [facility] for Infectious Disease  Date of Admission:  05/04/2020           Day 11 vancomycin        Day 11 cefepime ASSESSMENT: She remains on vancomycin and cefepime for MRSA and Pseudomonas mediastinitis.  On a minimum 6-week course of therapy.  PLAN: 1. Continue current antibiotics 2. I have arranged follow-up in my clinic and will sign off now  Diagnosis: Mediastinitis  Culture Result: MRSA and Pseudomonas  Allergies  Allergen Reactions  . Bee Venom Swelling    Massive swelling  . Penicillins Other (See Comments)    ** tolerates Zosyn + cephalosporins Did it involve swelling of the face/tongue/throat, SOB, or low BP? Unknown Did it involve sudden or severe rash/hives, skin peeling, or any reaction on the inside of your mouth or nose? Unknown Did you need to seek medical attention at a hospital or doctor's office? Unknown When did it last happen?1945 If all above answers are "NO", may proceed with cephalosporin use.  . Adhesive [Tape] Rash  . Morphine And Related Rash  . Sulfa Antibiotics Nausea And Vomiting and Rash    OPAT Orders Discharge antibiotics to be given via PICC line Discharge antibiotics: Per pharmacy protocol vancomycin and cefepime Aim for Vancomycin trough 15-20 or AUC 400-550 (unless otherwise indicated) Duration: 6 weeks End Date: 07/08/20  [facility] Care Per Protocol:  Home health RN for IV administration and teaching; PICC line care and labs.    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 aspirin EC  81 mg Oral QHS  . atorvastatin  80 mg Oral Daily  . Chlorhexidine Gluconate Cloth  6 each Topical Daily  . feeding supplement (ENSURE ENLIVE)  237 mL Oral QID  . fluticasone  2 spray Each Nare Daily  . levothyroxine  100 mcg Oral Once per day on Mon Wed Fri  . levothyroxine  200 mcg Oral Once per day on Sun Tue Thu Sat  . lisinopril  20 mg Oral Daily  . metoprolol tartrate  25 mg Oral BID PC  . multivitamin with minerals  1 tablet Oral Q1200  . rivaroxaban  20 mg Oral Q supper  . sertraline  25 mg Oral QHS  . sodium chloride flush  10-40 mL Intracatheter Q12H  . sodium chloride flush  3 mL Intravenous Q12H   Continuous Infusions: . ceFEPime (MAXIPIME) IV 2 g (05/13/20 2322)  . dextrose 5 % lactated ringers with kcl 10 mL/hr at 05/11/20 0036  . vancomycin 500 mg (05/13/20 2211)   PRN Meds:.acetaminophen **OR** acetaminophen, ondansetron **OR** ondansetron (  ZOFRAN) IV, senna-docusate, sodium chloride flush, traMADol   SUBJECTIVE:  She expresses frustration over her difficult postoperative course.  Review of Systems: Review of Systems  Constitutional: Negative for fever.  Cardiovascular: Negative for chest pain.  Gastrointestinal: Negative for abdominal pain, diarrhea, nausea and vomiting.    Allergies  Allergen Reactions  . Bee Venom Swelling    Massive swelling  . Penicillins Other (See Comments)    ** tolerates Zosyn + cephalosporins Did it involve swelling of the face/tongue/throat, SOB, or low BP? Unknown Did it involve sudden or severe rash/hives, skin peeling, or any reaction on the inside of your mouth or nose?  Unknown Did you need to seek medical attention at a hospital or doctor's office? Unknown When did it last happen?1945 If all above answers are "NO", may proceed with cephalosporin use.  . Adhesive [Tape] Rash  . Morphine And Related Rash  . Sulfa Antibiotics Nausea And Vomiting and Rash    OBJECTIVE: Vitals:   05/13/20 2339 05/14/20 0345 05/14/20 0403 05/14/20 0920  BP: (!) 131/52 (!) 114/43  (!) 134/58  Pulse: 82 66    Resp: '15 17  20  ' Temp: 98 F (36.7 C) 98 F (36.7 C)  97.6 F (36.4 C)  TempSrc: Oral Oral  Oral  SpO2: 99% 97%  99%  Weight:   61.3 kg   Height:       Body mass index is 23.94 kg/m.  Physical Exam Constitutional:      Comments: She is slightly groggy and confused.   Cardiovascular:     Rate and Rhythm: Normal rate and regular rhythm.     Heart sounds: No murmur heard.   Pulmonary:     Breath sounds: Normal breath sounds.  Chest:    Skin:    Comments: Left arm PICC site looks good.     Lab Results Lab Results  Component Value Date   WBC 18.5 (H) 05/13/2020   HGB 8.2 (L) 05/13/2020   HCT 26.7 (L) 05/13/2020   MCV 84.5 05/13/2020   PLT 390 05/13/2020    Lab Results  Component Value Date   CREATININE 0.99 05/14/2020   BUN 37 (H) 05/14/2020   NA 136 05/14/2020   K 5.2 (H) 05/14/2020   CL 105 05/14/2020   CO2 25 05/14/2020    Lab Results  Component Value Date   ALT 218 (H) 05/04/2020   AST 182 (H) 05/04/2020   ALKPHOS 74 05/04/2020   BILITOT 0.4 05/04/2020     Microbiology: Recent Results (from the past 240 hour(s))  MRSA PCR Screening     Status: None   Collection Time: 05/05/20  1:10 AM   Specimen: Nasopharyngeal  Result Value Ref Range Status   MRSA by PCR NEGATIVE NEGATIVE Final    Comment:        The GeneXpert MRSA Assay (FDA approved for NASAL specimens only), is one component of a comprehensive MRSA colonization surveillance program. It is not intended to diagnose MRSA infection nor to guide or monitor  treatment for MRSA infections. Performed at [facility], [place] [address] [place], [place] [address]   SARS Coronavirus 2 by RT PCR (hospital order, performed in [facility] hospital lab) Nasopharyngeal Nasopharyngeal Swab     Status: None   Collection Time: 05/05/20  9:16 AM   Specimen: Nasopharyngeal Swab  Result Value Ref Range Status   SARS Coronavirus 2 NEGATIVE NEGATIVE Final    Comment: (NOTE) SARS-CoV-2 target nucleic acids are NOT DETECTED.  The SARS-CoV-2 RNA is generally detectable in upper and lower respiratory specimens during the acute phase of infection. The lowest concentration of SARS-CoV-2 viral copies this assay can detect is 250 copies / mL. A negative result does not preclude SARS-CoV-2 infection and should not be used as the sole basis for treatment or other patient management decisions.  A negative result may occur with improper specimen collection / handling, submission of specimen other than nasopharyngeal swab, presence of viral mutation(s) within the areas targeted by this assay, and inadequate number of viral copies (<250 copies / mL). A negative result must be combined with clinical observations, patient history, and epidemiological information.  Fact Sheet for Patients:   StrictlyIdeas.no  Fact Sheet for Healthcare Providers: BankingDealers.co.za  This test is not yet approved or  cleared by the [place] FDA and has been authorized for detection and/or diagnosis of SARS-CoV-2 by FDA under an Emergency Use Authorization (EUA).  This EUA will remain in effect (meaning this test can be used) for the duration of the COVID-19 declaration under Section 564(b)(1) of the Act, 21 U.S.C. section 360bbb-3(b)(1), unless the authorization is terminated or revoked sooner.  Performed at [facility], [place] [address], [place], [place] [address]   Aerobic/Anaerobic Culture (surgical/deep wound)      Status: None   Collection Time: 05/05/20 11:37 AM   Specimen: Wound  Result Value Ref Range Status   Specimen Description WOUND TISSUE  Final   Special Requests STERNAL INFECTION  Final   Gram Stain NO WBC SEEN NO ORGANISMS SEEN   Final   Culture   Final    FEW METHICILLIN RESISTANT STAPHYLOCOCCUS AUREUS RARE PSEUDOMONAS AERUGINOSA NO ANAEROBES ISOLATED Performed at [facility], [address] [place], [place] [address]    Report Status 05/10/2020 FINAL  Final   Organism ID, Bacteria METHICILLIN RESISTANT STAPHYLOCOCCUS AUREUS  Final   Organism ID, Bacteria PSEUDOMONAS AERUGINOSA  Final      Susceptibility   Methicillin resistant staphylococcus aureus - MIC*    CIPROFLOXACIN >=8 RESISTANT Resistant     ERYTHROMYCIN >=8 RESISTANT Resistant     GENTAMICIN <=0.5 SENSITIVE Sensitive     OXACILLIN >=4 RESISTANT Resistant     TETRACYCLINE <=1 SENSITIVE Sensitive     VANCOMYCIN 1 SENSITIVE Sensitive     TRIMETH/SULFA <=10 SENSITIVE Sensitive     CLINDAMYCIN <=0.25 SENSITIVE Sensitive     RIFAMPIN <=0.5 SENSITIVE Sensitive     Inducible Clindamycin NEGATIVE Sensitive     * FEW METHICILLIN RESISTANT STAPHYLOCOCCUS AUREUS   Pseudomonas aeruginosa - MIC*    CEFTAZIDIME 4 SENSITIVE Sensitive     CIPROFLOXACIN 2 INTERMEDIATE Intermediate     GENTAMICIN <=1 SENSITIVE Sensitive     IMIPENEM 2 SENSITIVE Sensitive     PIP/TAZO 8 SENSITIVE Sensitive     CEFEPIME 2 SENSITIVE Sensitive     * RARE PSEUDOMONAS AERUGINOSA  Aerobic/Anaerobic Culture (surgical/deep wound)     Status: None   Collection Time: 05/05/20 11:40 AM   Specimen: Wound  Result Value Ref Range Status   Specimen Description WOUND  Final   Special Requests STERNAL INFECTION SENT ON SWABS  Final   Gram Stain   Final    RARE WBC PRESENT, PREDOMINANTLY MONONUCLEAR NO ORGANISMS SEEN    Culture   Final    FEW METHICILLIN RESISTANT STAPHYLOCOCCUS AUREUS NO ANAEROBES ISOLATED Performed at [facility], [address], [place], [place] [address]    Report Status 05/10/2020 FINAL  Final   Organism ID, Bacteria METHICILLIN RESISTANT STAPHYLOCOCCUS AUREUS  Final      Susceptibility   Methicillin resistant staphylococcus aureus - MIC*    CIPROFLOXACIN >=8 RESISTANT Resistant     ERYTHROMYCIN >=8 RESISTANT Resistant     GENTAMICIN <=0.5 SENSITIVE Sensitive     OXACILLIN >=4 RESISTANT Resistant     TETRACYCLINE <=1 SENSITIVE Sensitive     VANCOMYCIN <=0.5 SENSITIVE Sensitive     TRIMETH/SULFA <=10 SENSITIVE Sensitive     CLINDAMYCIN <=0.25 SENSITIVE Sensitive     RIFAMPIN <=0.5 SENSITIVE Sensitive     Inducible Clindamycin NEGATIVE Sensitive     * FEW METHICILLIN RESISTANT STAPHYLOCOCCUS AUREUS  Culture, Urine     Status: None   Collection Time: 05/12/20  9:57 AM   Specimen: Urine, Random  Result Value Ref Range Status   Specimen Description URINE, RANDOM  Final   Special Requests NONE  Final   Culture   Final    NO GROWTH Performed at [facility], [place] [address], [place], [place] [address]    Report Status 05/13/2020 FINAL  Final    Michel Bickers, MD [place] [facility] [place] [facility] [phone] pager   [phone] cell 05/14/2020, 10:12 AM

## 2020-05-14 NOTE — TOC Progression Note (Addendum)
Transition of Care [facility]) - Progression Note    Patient Details  Name: [name] [name] MRN: [id number] Date of Birth: [birth year]  Transition of Care [facility]) CM/SW [place], [place] Phone Number: 05/14/2020, 1:22 PM  Clinical Narrative:     1:27pm-CSW attempted to contact patient's nephew,[name], to determine if  family was aware patient was in the hospital. CSW has worked with patient and her nephew back in April/2021, regarding placement. CSW was unable to reach [facility] or leave a message.  SNF confirmed they can admit patient with JP drains in place and return to MD office in one week for follow-up.  Patient insurance informed of SNF choice-authorization pending -patient needs covid test(RN informed.  10:30am-CSW visit with patient to confirm [facility] as SNF choice. Patient has been going back and forth with SNF choice. Patient preferred another SNF but was informed by CSW Maple Pauline Aus  was not in network with her insurance. Patient's friend Caryl Asp, also at the bedside discussed other SNF choices with the patient and both agreeable to [facility]. Although patient appeared to be somewhat overall  disoriented/confused during the conversation, she states she understood why [facility] was not an option.   Thurmond Butts, MSW, [place] Clinical Social Worker    Expected Discharge Plan: Skilled Nursing Facility Barriers to Discharge: SNF Pending bed offer, Continued Medical Work up, Ship broker  Expected Discharge Plan and Services Expected Discharge Plan: [place] In-house Referral: Clinical Social Work     Living arrangements for the past 2 months: Single Family Home                                       Social Determinants of Health (SDOH) Interventions    Readmission Risk Interventions No flowsheet data found.

## 2020-05-14 NOTE — Progress Notes (Signed)
Physical Therapy Treatment Patient Details Name: [name] [name] MRN: [id number] DOB: Jul 12, 1941 Today's Date: 05/14/2020    History of Present Illness 79 y.o. female was brought to ED, demonstrating chest pain over last few days. s/p sternal I&D and flap closure 6/25. 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 ETT 3/12-3/13 (for procedure), 3/15-3/19. 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    PT Comments    Patient not feeling great this morning. Agreeable to work with therapy. Requires heavy Mod A for bed mobility with cues for sequencing/technique. Limited by pain in right posterior shoulder with pain to light touch and any movement. Pt sitting EOB in right lateral lean supporting self with UEs and Min A needed. Declined transfer to [facility] and chair due to above pain. RN aware. Poor activity tolerance today.  Continue to recommend SNF. Will follow.     Follow Up Recommendations  SNF;Supervision/Assistance - 24 hour     Equipment Recommendations  None recommended by PT    Recommendations for Other Services       Precautions / Restrictions Precautions Precautions: Sternal Precaution Booklet Issued: No Precaution Comments: Wound vac, redo sternal I&D Restrictions Weight Bearing Restrictions: Yes Other Position/Activity Restrictions: sternal    Mobility  Bed Mobility Overal bed mobility: Needs Assistance Bed Mobility: Rolling;Sidelying to Sit;Sit to Sidelying Rolling: Min assist Sidelying to sit: Mod assist;HOB elevated     Sit to sidelying: Mod assist;HOB elevated General bed mobility comments: Rolling to right x2 withi cues to reach for rail. Not able to use RUE due to pain with any movement; assist with trunk and to get LEs off bed and return to sidelying.  Transfers                 General transfer comment: Declined attempting to stand, sitting EOB with flexed trunk and narrow boS with  right lateral lean; not letting therapist assist to [facility] despite needing to have a BM. Attempted to widen BoS but any light touch to RUE, pt yelled in pain so returned to supine.  Ambulation/Gait             General Gait Details: Unable   Stairs             Wheelchair Mobility    Modified Rankin (Stroke Patients Only)       Balance Overall balance assessment: Needs assistance Sitting-balance support: Feet supported;Single extremity supported Sitting balance-Leahy Scale: Poor Sitting balance - Comments: right lateral lean, holding onto bed for support; Min A for sitting balance. Postural control: Right lateral lean     Standing balance comment: Declined. Pain and self limiting behaviors.                            Cognition Arousal/Alertness: Awake/alert Behavior During Therapy: Anxious Overall Cognitive Status: Impaired/Different from baseline Area of Impairment: Problem solving;Safety/judgement                         Safety/Judgement: Decreased awareness of deficits   Problem Solving: Slow processing;Requires verbal cues;Difficulty sequencing;Decreased initiation General Comments: continues to demonstrate poor insight and self limiting behavior. Anxious about any movement esp relating to right shoulder.      Exercises General Exercises - Lower Extremity Ankle Circles/Pumps: AROM;Both;10 reps;Supine    General Comments General comments (skin integrity, edema, etc.): VSS on RA.  Pertinent Vitals/Pain Pain Assessment: Faces Faces Pain Scale: Hurts worst Pain Location: right shoulder with any light touch, movement Pain Descriptors / Indicators: Grimacing;Guarding;Discomfort;Moaning;Tender Pain Intervention(s): Monitored during session;Repositioned;Limited activity within patient's tolerance    Home Living                      Prior Function            PT Goals (current goals can now be found in the care plan  section) Progress towards PT goals: Not progressing toward goals - comment (due to pain, self limiting)    Frequency    Min 2X/week      PT Plan Frequency needs to be updated    Co-evaluation              AM-PAC PT "6 Clicks" Mobility   Outcome Measure  Help needed turning from your back to your side while in a flat bed without using bedrails?: A Lot Help needed moving from lying on your back to sitting on the side of a flat bed without using bedrails?: A Lot Help needed moving to and from a bed to a chair (including a wheelchair)?: A Lot Help needed standing up from a chair using your arms (e.g., wheelchair or bedside chair)?: A Lot Help needed to walk in hospital room?: A Lot Help needed climbing 3-5 steps with a railing? : Total 6 Click Score: 11    End of Session Equipment Utilized During Treatment: Gait belt Activity Tolerance: Patient limited by pain Patient left: in bed;with call bell/phone within reach;with bed alarm set (sitting on bed pan; RN made aware) Nurse Communication: Mobility status;Other (comment) (on bed pain and pain in RUE) PT Visit Diagnosis: Unsteadiness on feet (R26.81);Other abnormalities of gait and mobility (R26.89);Muscle weakness (generalized) (M62.81) Hemiplegia - Right/Left: Right Hemiplegia - dominant/non-dominant: Dominant Hemiplegia - caused by: Cerebral infarction Pain - part of body: Arm     Time: 1013-1036 PT Time Calculation (min) (ACUTE ONLY): 23 min  Charges:  $Therapeutic Activity: 23-37 mins                     Marisa Severin, PT, DPT Acute Rehabilitation Services Pager [phone] Office [place] 05/14/2020, 2:01 PM

## 2020-05-14 NOTE — Progress Notes (Signed)
PHARMACY CONSULT NOTE FOR:  OUTPATIENT  PARENTERAL ANTIBIOTIC THERAPY (OPAT)  Indication: Mediastinitis Regimen: Vancomycin 500 q 12 hours + Cefepime 2 gm q 12 hours End date: July 11, 2020  F/U vanc trough for adjustments  IV antibiotic discharge orders are pended. To discharging provider:  please sign these orders via discharge navigator,  Select New Orders & click on the button choice - Manage This Unsigned Work.     Thank you for allowing pharmacy to be a part of this patient's care.  Jimmy Footman, PharmD, BCPS, BCIDP Infectious Diseases Clinical Pharmacist Phone: [phone] 05/14/2020, 10:18 AM

## 2020-05-14 NOTE — Progress Notes (Signed)
Occupational Therapy Treatment Patient Details Name: [name] [name] MRN: [id number] DOB: 1940/12/27 Today's Date: 05/14/2020    History of present illness 79 y.o. female was brought to ED, demonstrating chest pain over last few days.  Not worsened by exertion but is reporting being less able to move her legs now.  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 ETT 3/12-3/13 (for procedure), 3/15-3/19. 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   OT comments  Pt calling out for assistance. Pt anxious and stating, "They put this tray here at 8:30, but I need help to eat it!" Pt not initiating use of call button or opening dome of plate. Pt able to self feed once set up. Placed call button in her lap and reinforced its use. Pt continues to be appropriate for SNF level rehab.  Follow Up Recommendations  SNF;Supervision/Assistance - 24 hour    Equipment Recommendations  Other (comment) (defer to next venue)    Recommendations for Other Services      Precautions / Restrictions Precautions Precautions: Fall       Mobility Bed Mobility Overal bed mobility: Needs Assistance Bed Mobility: Rolling Rolling: Min assist         General bed mobility comments: rolled to change bed pad  Transfers                      Balance                                           ADL either performed or assessed with clinical judgement   ADL Overall ADL's : Needs assistance/impaired Eating/Feeding: Minimal assistance;Bed level Eating/Feeding Details (indicate cue type and reason): assist to open containers, cut food Grooming: Wash/dry hands;Wash/dry face;Bed level;Set up Grooming Details (indicate cue type and reason): cues for thoroughness                     Toileting- Clothing Manipulation and Hygiene: Total assistance;Bed level               Vision       Perception      Praxis      Cognition Arousal/Alertness: Awake/alert Behavior During Therapy: Anxious Overall Cognitive Status: Impaired/Different from baseline Area of Impairment: Problem solving;Safety/judgement                         Safety/Judgement: Decreased awareness of deficits   Problem Solving: Slow processing;Requires verbal cues;Difficulty sequencing;Decreased initiation General Comments: continues to demonstrate poor insight and self limiting behavior        Exercises     Shoulder Instructions       General Comments      Pertinent Vitals/ Pain       Pain Assessment: Faces Faces Pain Scale: Hurts even more Pain Location: buttocks Pain Descriptors / Indicators: Grimacing;Sore Pain Intervention(s): Repositioned  Home Living                                          Prior Functioning/Environment              Frequency  Min 2X/week        Progress  Toward Goals  OT Goals(current goals can now be found in the care plan section)  Progress towards OT goals: Progressing toward goals  Acute Rehab OT Goals Patient Stated Goal: to eat OT Goal Formulation: With patient Time For Goal Achievement: 05/23/20 Potential to Achieve Goals: [place] Discharge plan remains appropriate    Co-evaluation                 AM-PAC OT "6 Clicks" Daily Activity     Outcome Measure   Help from another person eating meals?: A Little Help from another person taking care of personal grooming?: A Little Help from another person toileting, which includes using toliet, bedpan, or urinal?: A Lot Help from another person bathing (including washing, rinsing, drying)?: A Lot Help from another person to put on and taking off regular upper body clothing?: A Lot Help from another person to put on and taking off regular lower body clothing?: Total 6 Click Score: 13    End of Session    OT Visit Diagnosis: Other abnormalities of gait and mobility  (R26.89);Unsteadiness on feet (R26.81);Muscle weakness (generalized) (M62.81);Other symptoms and signs involving cognitive function;Cognitive communication deficit (R41.841);Pain   Activity Tolerance  (self limiting)   Patient Left in bed;with call bell/phone within reach;with bed alarm set;with nursing/sitter in room   Nurse Communication          Time: [phone] OT Time Calculation (min): 14 min  Charges: OT General Charges $OT Visit: 1 Visit OT Treatments $Self Care/Home Management : 8-22 mins  [name] [name], OTR/L Acute Rehabilitation Services Pager: [phone] Office: [phone]   [name] [name] 05/14/2020, 10:19 AM

## 2020-05-14 NOTE — Progress Notes (Signed)
Pharmacy Antibiotic Note  [name] [name] is a 79 y.o. female admitted on 05/04/2020 with sternal wound infection after CABG   Pharmacy has been consulted for cefepime and vancomycin dosing for wound infection. Per ID notes, plans are for 6 weeks of antibiotics, last day 06/16/2020.   Scr trended up then plateaued, K also up trended. UOP incompletely documented.   6/28 VT (drawn correctly) = 24 is supratherapeutic (Goal 15- 20)   Plan: Hold one dose of vancomycin and decrease to 750mg  Q24 hr (estimated trough ~15) Continue cefepime 2g Q12 hr  OPAT orders updated Monitor renal function   Monitor at least weekly vancomycin troughs and Scr, next due 05/21/20  Height: 5\' 3"  (160 cm) Weight: 61.3 kg (135 lb 2.3 oz) IBW/kg (Calculated) : 52.4  Temp (24hrs), Avg:97.9 F (36.6 C), Min:97.5 F (36.4 C), Max:98.1 F (36.7 C)  Recent Labs  Lab 05/08/20 0525 05/09/20 0908 05/10/20 0630 05/11/20 1541 05/12/20 0415 05/13/20 0236 05/14/20 0429  WBC  --   --  15.2*  --   --  18.5*  --   CREATININE   < >  --  0.66 0.60 0.71 1.01* 0.99  VANCOTROUGH  --  16  --   --   --   --   --    < > = values in this interval not displayed.    Estimated Creatinine Clearance: 40 mL/min (by C-G formula based on SCr of 0.99 mg/dL).    Allergies  Allergen Reactions  . Bee Venom Swelling    Massive swelling  . Penicillins Other (See Comments)    ** tolerates Zosyn + cephalosporins Did it involve swelling of the face/tongue/throat, SOB, or low BP? Unknown Did it involve sudden or severe rash/hives, skin peeling, or any reaction on the inside of your mouth or nose? Unknown Did you need to seek medical attention at a hospital or doctor's office? Unknown When did it last happen?1945 If all above answers are "NO", may proceed with cephalosporin use.  . Adhesive [Tape] Rash  . Morphine And Related Rash  . Sulfa Antibiotics Nausea And Vomiting and Rash    Antimicrobials this admission: 6/18  vancomycin >> 6/18 cefepime >>   Antibiotic dose adjustments: 6/28 Vancomycin 500 mg Q12hr > 750mg  Q24 hr  Microbiology results: 6/19 Wound cx> few MRSA S-doxy and septra 6/19 Wound cx> few MRSA, rare pseudomonas (cipro intermediate) 6/19 MRSA PCR neg 6/26 Ucx ngtd  Benetta Spar, PharmD, BCPS, [place] Clinical Pharmacist  Please check AMION for all [place] phone numbers After 10:00 PM, call [place] [phone]

## 2020-05-14 NOTE — Progress Notes (Signed)
CRITICAL VALUE ALERT  Critical Value:  Vancomycin=24  Date & Time Notied:  05/14/20  pharmacist Notified: 1528  Orders Received/Actions taken: order received changing rate of vancomycin

## 2020-05-14 NOTE — Progress Notes (Signed)
      [address]       [place] [address]             [phone]      3 Days Post-Op Procedure(s) (LRB): STERNAL DEBRIDEMENT AND STERNAL CLOSURE (N/A)   Subjective:  Patient doing okay.  She has concerns that we spoke of and addressed.  She again states she doesn't want to go to [facility] and chooses [facility]  Objective: Vital signs in last 24 hours: Temp:  [97.5 F (36.4 C)-98.5 F (36.9 C)] 97.6 F (36.4 C) (06/28 0920) Pulse Rate:  [56-82] 66 (06/28 0345) Cardiac Rhythm: Sinus bradycardia (06/28 0700) Resp:  [12-20] 20 (06/28 0920) BP: (95-134)/(36-58) 134/58 (06/28 0920) SpO2:  [96 %-99 %] 99 % (06/28 0920) Weight:  [61.3 kg] 61.3 kg (06/28 0403)  Intake/Output from previous day: 06/27 0701 - 06/28 0700 In: 275 [P.O.:240] Out: 500 [Urine:500]  General appearance: alert, cooperative and no distress Heart: regular rate and rhythm Lungs: clear to auscultation bilaterally Abdomen: soft, non-tender; bowel sounds normal; no masses,  no organomegaly Extremities: extremities normal, atraumatic, no cyanosis or edema Wound: pravena in place, JP drains in place  Lab Results: Recent Labs    05/11/20 1541 05/13/20 0236  WBC  --  18.5*  HGB 8.2* 8.2*  HCT 24.0* 26.7*  PLT  --  390   BMET:  Recent Labs    05/13/20 0236 05/14/20 0429  NA 136 136  K 4.8 5.2*  CL 102 105  CO2 25 25  GLUCOSE 112* 111*  BUN 35* 37*  CREATININE 1.01* 0.99  CALCIUM 10.0 10.3    PT/INR: No results for input(s): LABPROT, INR in the last 72 hours. ABG    Component Value Date/Time   PHART 7.455 (H) 05/05/2020 0018   HCO3 24.4 05/05/2020 0018   TCO2 27 05/11/2020 1541   ACIDBASEDEF 2.0 01/28/2020 1545   O2SAT 96.0 05/05/2020 0018   CBG (last 3)  No results for input(s): GLUCAP in the last 72 hours.  Assessment/Plan: S/P Procedure(s) (LRB): STERNAL DEBRIDEMENT AND STERNAL CLOSURE (N/A)  1. CV- Sinus Bradycardia- continue Lisinopril, Lopressor 2. Pulm-  no acute issues, continue IS 3. Malnutrition- eating much improved with changed diet 4. ID-MRSA/Pseudomonas- on IV ABX will need 6 weeks of total therapy, PICC line is in place, JP drains remain in place... will plan to remove these in our office in 1 week 5. Dispo- patient stable, awaiting SNF bed, stable for discharge once available   LOS: 10 days    Ellwood Handler, PA-C 05/14/2020

## 2020-05-14 NOTE — Discharge Instructions (Signed)
DO NOT REMOVE JP DRAINS- Please drain as needed and record output  PLEASE DRAW LABS ONCE A WEEK- CBC w/DIFF, BMP, CRP, ESR, Vanc Trough.... please fax to [phone]

## 2020-05-15 DIAGNOSIS — Z66 Do not resuscitate: Secondary | ICD-10-CM | POA: Diagnosis not present

## 2020-05-15 DIAGNOSIS — M869 Osteomyelitis, unspecified: Secondary | ICD-10-CM | POA: Diagnosis not present

## 2020-05-15 DIAGNOSIS — G319 Degenerative disease of nervous system, unspecified: Secondary | ICD-10-CM | POA: Diagnosis not present

## 2020-05-15 DIAGNOSIS — E860 Dehydration: Secondary | ICD-10-CM | POA: Diagnosis not present

## 2020-05-15 DIAGNOSIS — R627 Adult failure to thrive: Secondary | ICD-10-CM | POA: Diagnosis not present

## 2020-05-15 DIAGNOSIS — M6281 Muscle weakness (generalized): Secondary | ICD-10-CM | POA: Diagnosis not present

## 2020-05-15 DIAGNOSIS — F339 Major depressive disorder, recurrent, unspecified: Secondary | ICD-10-CM | POA: Diagnosis not present

## 2020-05-15 DIAGNOSIS — Q211 Atrial septal defect: Secondary | ICD-10-CM | POA: Diagnosis not present

## 2020-05-15 DIAGNOSIS — G8929 Other chronic pain: Secondary | ICD-10-CM | POA: Diagnosis not present

## 2020-05-15 DIAGNOSIS — R1084 Generalized abdominal pain: Secondary | ICD-10-CM | POA: Diagnosis not present

## 2020-05-15 DIAGNOSIS — L89309 Pressure ulcer of unspecified buttock, unspecified stage: Secondary | ICD-10-CM | POA: Diagnosis not present

## 2020-05-15 DIAGNOSIS — I119 Hypertensive heart disease without heart failure: Secondary | ICD-10-CM | POA: Diagnosis not present

## 2020-05-15 DIAGNOSIS — R279 Unspecified lack of coordination: Secondary | ICD-10-CM | POA: Diagnosis not present

## 2020-05-15 DIAGNOSIS — Z9049 Acquired absence of other specified parts of digestive tract: Secondary | ICD-10-CM | POA: Diagnosis not present

## 2020-05-15 DIAGNOSIS — E870 Hyperosmolality and hypernatremia: Secondary | ICD-10-CM | POA: Diagnosis not present

## 2020-05-15 DIAGNOSIS — L89311 Pressure ulcer of right buttock, stage 1: Secondary | ICD-10-CM | POA: Diagnosis not present

## 2020-05-15 DIAGNOSIS — J9851 Mediastinitis: Secondary | ICD-10-CM | POA: Diagnosis not present

## 2020-05-15 DIAGNOSIS — R2689 Other abnormalities of gait and mobility: Secondary | ICD-10-CM | POA: Diagnosis not present

## 2020-05-15 DIAGNOSIS — D649 Anemia, unspecified: Secondary | ICD-10-CM | POA: Diagnosis not present

## 2020-05-15 DIAGNOSIS — M2550 Pain in unspecified joint: Secondary | ICD-10-CM | POA: Diagnosis not present

## 2020-05-15 DIAGNOSIS — R41841 Cognitive communication deficit: Secondary | ICD-10-CM | POA: Diagnosis not present

## 2020-05-15 DIAGNOSIS — E872 Acidosis: Secondary | ICD-10-CM | POA: Diagnosis not present

## 2020-05-15 DIAGNOSIS — F329 Major depressive disorder, single episode, unspecified: Secondary | ICD-10-CM | POA: Diagnosis not present

## 2020-05-15 DIAGNOSIS — E7800 Pure hypercholesterolemia, unspecified: Secondary | ICD-10-CM | POA: Diagnosis not present

## 2020-05-15 DIAGNOSIS — Z7189 Other specified counseling: Secondary | ICD-10-CM | POA: Diagnosis not present

## 2020-05-15 DIAGNOSIS — I6529 Occlusion and stenosis of unspecified carotid artery: Secondary | ICD-10-CM | POA: Diagnosis not present

## 2020-05-15 DIAGNOSIS — R4781 Slurred speech: Secondary | ICD-10-CM | POA: Diagnosis not present

## 2020-05-15 DIAGNOSIS — R4701 Aphasia: Secondary | ICD-10-CM | POA: Diagnosis not present

## 2020-05-15 DIAGNOSIS — R5381 Other malaise: Secondary | ICD-10-CM | POA: Diagnosis not present

## 2020-05-15 DIAGNOSIS — J9611 Chronic respiratory failure with hypoxia: Secondary | ICD-10-CM | POA: Diagnosis not present

## 2020-05-15 DIAGNOSIS — R1033 Periumbilical pain: Secondary | ICD-10-CM | POA: Diagnosis not present

## 2020-05-15 DIAGNOSIS — I700 Atherosclerosis of aorta: Secondary | ICD-10-CM | POA: Diagnosis not present

## 2020-05-15 DIAGNOSIS — I959 Hypotension, unspecified: Secondary | ICD-10-CM | POA: Diagnosis not present

## 2020-05-15 DIAGNOSIS — C73 Malignant neoplasm of thyroid gland: Secondary | ICD-10-CM | POA: Diagnosis not present

## 2020-05-15 DIAGNOSIS — E785 Hyperlipidemia, unspecified: Secondary | ICD-10-CM | POA: Diagnosis not present

## 2020-05-15 DIAGNOSIS — Z743 Need for continuous supervision: Secondary | ICD-10-CM | POA: Diagnosis not present

## 2020-05-15 DIAGNOSIS — N179 Acute kidney failure, unspecified: Secondary | ICD-10-CM | POA: Diagnosis not present

## 2020-05-15 DIAGNOSIS — Z0389 Encounter for observation for other suspected diseases and conditions ruled out: Secondary | ICD-10-CM | POA: Diagnosis not present

## 2020-05-15 DIAGNOSIS — Z20822 Contact with and (suspected) exposure to covid-19: Secondary | ICD-10-CM | POA: Diagnosis not present

## 2020-05-15 DIAGNOSIS — N281 Cyst of kidney, acquired: Secondary | ICD-10-CM | POA: Diagnosis not present

## 2020-05-15 DIAGNOSIS — I10 Essential (primary) hypertension: Secondary | ICD-10-CM | POA: Diagnosis not present

## 2020-05-15 DIAGNOSIS — I2510 Atherosclerotic heart disease of native coronary artery without angina pectoris: Secondary | ICD-10-CM | POA: Diagnosis not present

## 2020-05-15 DIAGNOSIS — G9341 Metabolic encephalopathy: Secondary | ICD-10-CM | POA: Diagnosis not present

## 2020-05-15 DIAGNOSIS — G8191 Hemiplegia, unspecified affecting right dominant side: Secondary | ICD-10-CM | POA: Diagnosis not present

## 2020-05-15 DIAGNOSIS — Z5181 Encounter for therapeutic drug level monitoring: Secondary | ICD-10-CM | POA: Diagnosis not present

## 2020-05-15 DIAGNOSIS — S21101A Unspecified open wound of right front wall of thorax without penetration into thoracic cavity, initial encounter: Secondary | ICD-10-CM | POA: Diagnosis not present

## 2020-05-15 DIAGNOSIS — I6782 Cerebral ischemia: Secondary | ICD-10-CM | POA: Diagnosis not present

## 2020-05-15 DIAGNOSIS — I639 Cerebral infarction, unspecified: Secondary | ICD-10-CM | POA: Diagnosis not present

## 2020-05-15 DIAGNOSIS — T8142XA Infection following a procedure, deep incisional surgical site, initial encounter: Secondary | ICD-10-CM | POA: Diagnosis not present

## 2020-05-15 DIAGNOSIS — Z90710 Acquired absence of both cervix and uterus: Secondary | ICD-10-CM | POA: Diagnosis not present

## 2020-05-15 DIAGNOSIS — L89321 Pressure ulcer of left buttock, stage 1: Secondary | ICD-10-CM | POA: Diagnosis not present

## 2020-05-15 DIAGNOSIS — D72829 Elevated white blood cell count, unspecified: Secondary | ICD-10-CM | POA: Diagnosis not present

## 2020-05-15 DIAGNOSIS — F330 Major depressive disorder, recurrent, mild: Secondary | ICD-10-CM | POA: Diagnosis not present

## 2020-05-15 DIAGNOSIS — T8149XA Infection following a procedure, other surgical site, initial encounter: Secondary | ICD-10-CM | POA: Diagnosis not present

## 2020-05-15 DIAGNOSIS — R2681 Unsteadiness on feet: Secondary | ICD-10-CM | POA: Diagnosis not present

## 2020-05-15 DIAGNOSIS — E440 Moderate protein-calorie malnutrition: Secondary | ICD-10-CM | POA: Diagnosis not present

## 2020-05-15 DIAGNOSIS — M9689 Other intraoperative and postprocedural complications and disorders of the musculoskeletal system: Secondary | ICD-10-CM | POA: Diagnosis not present

## 2020-05-15 DIAGNOSIS — E46 Unspecified protein-calorie malnutrition: Secondary | ICD-10-CM | POA: Diagnosis not present

## 2020-05-15 DIAGNOSIS — Z95828 Presence of other vascular implants and grafts: Secondary | ICD-10-CM | POA: Diagnosis not present

## 2020-05-15 DIAGNOSIS — R278 Other lack of coordination: Secondary | ICD-10-CM | POA: Diagnosis not present

## 2020-05-15 DIAGNOSIS — I25810 Atherosclerosis of coronary artery bypass graft(s) without angina pectoris: Secondary | ICD-10-CM | POA: Diagnosis not present

## 2020-05-15 DIAGNOSIS — Z951 Presence of aortocoronary bypass graft: Secondary | ICD-10-CM | POA: Diagnosis not present

## 2020-05-15 DIAGNOSIS — I161 Hypertensive emergency: Secondary | ICD-10-CM | POA: Diagnosis not present

## 2020-05-15 DIAGNOSIS — Z515 Encounter for palliative care: Secondary | ICD-10-CM | POA: Diagnosis not present

## 2020-05-15 DIAGNOSIS — E782 Mixed hyperlipidemia: Secondary | ICD-10-CM | POA: Diagnosis not present

## 2020-05-15 DIAGNOSIS — R1030 Lower abdominal pain, unspecified: Secondary | ICD-10-CM | POA: Diagnosis not present

## 2020-05-15 DIAGNOSIS — R270 Ataxia, unspecified: Secondary | ICD-10-CM | POA: Diagnosis not present

## 2020-05-15 DIAGNOSIS — I69320 Aphasia following cerebral infarction: Secondary | ICD-10-CM | POA: Diagnosis not present

## 2020-05-15 DIAGNOSIS — R109 Unspecified abdominal pain: Secondary | ICD-10-CM | POA: Diagnosis not present

## 2020-05-15 DIAGNOSIS — I2699 Other pulmonary embolism without acute cor pulmonale: Secondary | ICD-10-CM | POA: Diagnosis not present

## 2020-05-15 MED ORDER — TRAMADOL HCL 50 MG PO TABS: 50.0000 mg | ORAL_TABLET | Freq: Four times a day (QID) | ORAL | 0 refills | Status: DC | PRN

## 2020-05-15 MED ORDER — VANCOMYCIN HCL 750 MG/150ML IV SOLN: 750.0000 mg | INTRAVENOUS | 0 refills | Status: DC

## 2020-05-15 MED ORDER — VANCOMYCIN IV (FOR PTA / DISCHARGE USE ONLY): 750.0000 mg | INTRAVENOUS | 0 refills | Status: DC

## 2020-05-15 MED ORDER — CEFEPIME IV (FOR PTA / DISCHARGE USE ONLY): 2.0000 g | Freq: Two times a day (BID) | INTRAVENOUS | 0 refills | Status: DC

## 2020-05-15 NOTE — TOC Progression Note (Signed)
Transition of Care [facility]) - Progression Note    Patient Details  Name: [name] [name] MRN: [id number] Date of Birth: 04/16/1941  Transition of Care [facility]) CM/SW [place], [place] Phone Number: 05/15/2020, 10:42 AM  Clinical Narrative:     CSW visit patient at bedside. CSW informed patient of anticipated discharge to [facility] today. Patient remains agreeable to SNF at [facility]. CSW inquired about nephew, [name], she smiled and shared she spoke with him this morning, [place] was [place] of the [place].   CSW spoke with patient's friend,Joy, advised of possible discharge today to SNF   CSW sent message to [facility], patient has received insurance auth and patient has had covid vaccines.  Thurmond Butts, MSW, [place] Clinical Social Worker   Expected Discharge Plan: Skilled Nursing Facility Barriers to Discharge: SNF Pending bed offer, Continued Medical Work up, Ship broker  Expected Discharge Plan and Services Expected Discharge Plan: [place] In-house Referral: Clinical Social Work     Living arrangements for the past 2 months: Single Family Home                                       Social Determinants of Health (SDOH) Interventions    Readmission Risk Interventions No flowsheet data found.

## 2020-05-15 NOTE — TOC Transition Note (Signed)
Transition of Care [facility]) - CM/SW Discharge Note   Patient Details  Name: [name] [name] MRN: [id number] Date of Birth: 1940/12/25  Transition of Care [facility]) CM/SW Contact:  V[place]ie Sill, [place] Phone Number: 05/15/2020, 1:24 PM   Clinical Narrative:     Patient will DC to: [facility] DC Date: 05/15/2020 Family Notified: Joy, friend Transport By: Corey Harold @ 2pm   Per MD patient is ready for discharge. RN, patient, and facility notified of DC. Discharge Summary sent to facility. RN given number for [phone]. Ambulance transport requested for patient.   Clinical Social Worker signing off.  Thurmond Butts, MSW, [place] Clinical Social Worker    Final next level of care: Skilled Nursing Facility Barriers to Discharge: Barriers Resolved   Patient Goals and CMS Choice     Choice offered to / list presented to : Patient  Discharge Placement              Patient chooses bed at: [facility] Patient to be transferred to facility by: [place] Name of family member notified: Joy,friend Patient and family notified of of transfer: 05/15/20  Discharge Plan and Services In-house Referral: Clinical Social Work                                   Social Determinants of Health ([place]) Interventions     Readmission Risk Interventions No flowsheet data found.

## 2020-05-15 NOTE — Progress Notes (Signed)
[address]       [place] [address]             [phone]      4 Days Post-Op Procedure(s) (LRB): STERNAL DEBRIDEMENT AND STERNAL CLOSURE (N/A) Subjective: Some incisional (sternal ) discomfort , but mostly controlled   Objective: Vital signs in last 24 hours: Temp:  [97.6 F (36.4 C)-98.2 F (36.8 C)] 98.2 F (36.8 C) (06/29 0400) Pulse Rate:  [67-75] 67 (06/29 0036) Cardiac Rhythm: Normal sinus rhythm (06/28 1900) Resp:  [17-20] 18 (06/29 0509) BP: (105-140)/(34-58) 125/47 (06/29 0400) SpO2:  [93 %-100 %] 100 % (06/29 0509) Weight:  [65.5 kg] 65.5 kg (06/29 0459)  Hemodynamic parameters for last 24 hours:    Intake/Output from previous day: 06/28 0701 - 06/29 0700 In: 1364.8 [P.O.:240; I.V.:814.8; IV Piggyback:300] Out: 400 [Urine:400] Intake/Output this shift: No intake/output data recorded.  General appearance: alert, cooperative and no distress Heart: regular rate and rhythm Lungs: clear to auscultation bilaterally Abdomen: benign Extremities: PAS in place Wound: Wound VAC in place  Lab Results: Recent Labs    05/13/20 0236  WBC 18.5*  HGB 8.2*  HCT 26.7*  PLT 390   BMET:  Recent Labs    05/13/20 0236 05/14/20 0429  NA 136 136  K 4.8 5.2*  CL 102 105  CO2 25 25  GLUCOSE 112* 111*  BUN 35* 37*  CREATININE 1.01* 0.99  CALCIUM 10.0 10.3    PT/INR: No results for input(s): LABPROT, INR in the last 72 hours. ABG    Component Value Date/Time   PHART 7.455 (H) 05/05/2020 0018   HCO3 24.4 05/05/2020 0018   TCO2 27 05/11/2020 1541   ACIDBASEDEF 2.0 01/28/2020 1545   O2SAT 96.0 05/05/2020 0018   CBG (last 3)  No results for input(s): GLUCAP in the last 72 hours.  Meds Scheduled Meds: . aspirin EC  81 mg Oral QHS  . atorvastatin  80 mg Oral Daily  . Chlorhexidine Gluconate Cloth  6 each Topical Daily  . feeding supplement (ENSURE ENLIVE)  237 mL Oral QID  . fluticasone  2 spray Each Nare Daily  . levothyroxine   100 mcg Oral Once per day on Mon Wed Fri  . levothyroxine  200 mcg Oral Once per day on Sun Tue Thu Sat  . lisinopril  20 mg Oral Daily  . metoprolol tartrate  25 mg Oral BID PC  . multivitamin with minerals  1 tablet Oral Q1200  . rivaroxaban  20 mg Oral Q supper  . sertraline  25 mg Oral QHS  . sodium chloride flush  10-40 mL Intracatheter Q12H  . sodium chloride flush  3 mL Intravenous Q12H   Continuous Infusions: . ceFEPime (MAXIPIME) IV Stopped (05/14/20 2211)  . dextrose 5 % lactated ringers with kcl 10 mL/hr at 05/15/20 0300  . vancomycin     PRN Meds:.acetaminophen **OR** acetaminophen, ondansetron **OR** ondansetron (ZOFRAN) IV, senna-docusate, sodium chloride flush, traMADol  Xrays DG Chest 2 View  Result Date: 05/13/2020 CLINICAL DATA:  Sternal wound infection post open heart surgery, history hypertension, stroke, coronary artery disease, thyroid carcinoma EXAM: CHEST - 2 VIEW COMPARISON:  05/11/2020 FINDINGS: LEFT arm PICC line with tip projecting over SVC. Epicardial pacing wires noted. Normal heart size post median sternotomy. Mediastinal contours and pulmonary vascularity normal. Minimal LEFT basilar atelectasis. Lungs otherwise clear. No acute infiltrate, pleural effusion, or pneumothorax. Bones demineralized with BILATERAL glenohumeral degenerative changes noted. IMPRESSION: Minimal LEFT  base atelectasis. Electronically Signed   By: Lavonia Dana M.D.   On: 05/13/2020 09:42    Assessment/Plan: S/P Procedure(s) (LRB): STERNAL DEBRIDEMENT AND STERNAL CLOSURE (N/A)  1 conts to be quite stable.  2 afeb, VSS 3 SNF bed search in place 4 incis VAC in place  LOS: 11 days    John Giovanni PA-C Pager [phone] 05/15/2020

## 2020-05-15 NOTE — Progress Notes (Addendum)
D/c tele. [facility] line on for long-term antibiotic. Changed the small PREVENA wound VACs. Called report to Nori Riis, Therapist, sports at [facility]. Getting pt ready to be discharged.  Lavenia Atlas, RN

## 2020-05-15 NOTE — Progress Notes (Signed)
Returned the wound VAC to the company.  Lavenia Atlas, RN

## 2020-05-16 DIAGNOSIS — J9611 Chronic respiratory failure with hypoxia: Secondary | ICD-10-CM | POA: Diagnosis not present

## 2020-05-16 DIAGNOSIS — Z9049 Acquired absence of other specified parts of digestive tract: Secondary | ICD-10-CM | POA: Diagnosis not present

## 2020-05-16 DIAGNOSIS — S21101A Unspecified open wound of right front wall of thorax without penetration into thoracic cavity, initial encounter: Secondary | ICD-10-CM | POA: Diagnosis not present

## 2020-05-16 DIAGNOSIS — L89309 Pressure ulcer of unspecified buttock, unspecified stage: Secondary | ICD-10-CM | POA: Diagnosis not present

## 2020-05-16 DIAGNOSIS — R2681 Unsteadiness on feet: Secondary | ICD-10-CM | POA: Diagnosis not present

## 2020-05-16 DIAGNOSIS — N281 Cyst of kidney, acquired: Secondary | ICD-10-CM | POA: Diagnosis not present

## 2020-05-16 DIAGNOSIS — E46 Unspecified protein-calorie malnutrition: Secondary | ICD-10-CM | POA: Diagnosis not present

## 2020-05-16 DIAGNOSIS — R278 Other lack of coordination: Secondary | ICD-10-CM | POA: Diagnosis not present

## 2020-05-16 DIAGNOSIS — G8191 Hemiplegia, unspecified affecting right dominant side: Secondary | ICD-10-CM | POA: Diagnosis not present

## 2020-05-16 DIAGNOSIS — R4781 Slurred speech: Secondary | ICD-10-CM | POA: Diagnosis not present

## 2020-05-16 DIAGNOSIS — Q211 Atrial septal defect: Secondary | ICD-10-CM | POA: Diagnosis not present

## 2020-05-16 DIAGNOSIS — R279 Unspecified lack of coordination: Secondary | ICD-10-CM | POA: Diagnosis not present

## 2020-05-16 DIAGNOSIS — T8142XA Infection following a procedure, deep incisional surgical site, initial encounter: Secondary | ICD-10-CM | POA: Diagnosis not present

## 2020-05-16 DIAGNOSIS — I6782 Cerebral ischemia: Secondary | ICD-10-CM | POA: Diagnosis not present

## 2020-05-16 DIAGNOSIS — M9689 Other intraoperative and postprocedural complications and disorders of the musculoskeletal system: Secondary | ICD-10-CM | POA: Diagnosis not present

## 2020-05-16 DIAGNOSIS — F329 Major depressive disorder, single episode, unspecified: Secondary | ICD-10-CM | POA: Diagnosis not present

## 2020-05-16 DIAGNOSIS — Z951 Presence of aortocoronary bypass graft: Secondary | ICD-10-CM | POA: Diagnosis not present

## 2020-05-16 DIAGNOSIS — Z515 Encounter for palliative care: Secondary | ICD-10-CM | POA: Diagnosis not present

## 2020-05-16 DIAGNOSIS — G9341 Metabolic encephalopathy: Secondary | ICD-10-CM | POA: Diagnosis not present

## 2020-05-16 DIAGNOSIS — M6281 Muscle weakness (generalized): Secondary | ICD-10-CM | POA: Diagnosis not present

## 2020-05-16 DIAGNOSIS — I25810 Atherosclerosis of coronary artery bypass graft(s) without angina pectoris: Secondary | ICD-10-CM | POA: Diagnosis not present

## 2020-05-16 DIAGNOSIS — R1030 Lower abdominal pain, unspecified: Secondary | ICD-10-CM | POA: Diagnosis not present

## 2020-05-16 DIAGNOSIS — I6529 Occlusion and stenosis of unspecified carotid artery: Secondary | ICD-10-CM | POA: Diagnosis not present

## 2020-05-16 DIAGNOSIS — R1033 Periumbilical pain: Secondary | ICD-10-CM | POA: Diagnosis not present

## 2020-05-16 DIAGNOSIS — I119 Hypertensive heart disease without heart failure: Secondary | ICD-10-CM | POA: Diagnosis not present

## 2020-05-16 DIAGNOSIS — N179 Acute kidney failure, unspecified: Secondary | ICD-10-CM | POA: Diagnosis not present

## 2020-05-16 DIAGNOSIS — L89311 Pressure ulcer of right buttock, stage 1: Secondary | ICD-10-CM | POA: Diagnosis not present

## 2020-05-16 DIAGNOSIS — L89321 Pressure ulcer of left buttock, stage 1: Secondary | ICD-10-CM | POA: Diagnosis not present

## 2020-05-16 DIAGNOSIS — R1084 Generalized abdominal pain: Secondary | ICD-10-CM | POA: Diagnosis not present

## 2020-05-16 DIAGNOSIS — M869 Osteomyelitis, unspecified: Secondary | ICD-10-CM | POA: Diagnosis not present

## 2020-05-16 DIAGNOSIS — F339 Major depressive disorder, recurrent, unspecified: Secondary | ICD-10-CM | POA: Diagnosis not present

## 2020-05-16 DIAGNOSIS — E860 Dehydration: Secondary | ICD-10-CM | POA: Diagnosis not present

## 2020-05-16 DIAGNOSIS — T8149XA Infection following a procedure, other surgical site, initial encounter: Secondary | ICD-10-CM | POA: Diagnosis not present

## 2020-05-16 DIAGNOSIS — E785 Hyperlipidemia, unspecified: Secondary | ICD-10-CM | POA: Diagnosis not present

## 2020-05-16 DIAGNOSIS — I2510 Atherosclerotic heart disease of native coronary artery without angina pectoris: Secondary | ICD-10-CM | POA: Diagnosis not present

## 2020-05-16 DIAGNOSIS — Z95828 Presence of other vascular implants and grafts: Secondary | ICD-10-CM | POA: Diagnosis not present

## 2020-05-16 DIAGNOSIS — E782 Mixed hyperlipidemia: Secondary | ICD-10-CM | POA: Diagnosis not present

## 2020-05-16 DIAGNOSIS — M2550 Pain in unspecified joint: Secondary | ICD-10-CM | POA: Diagnosis not present

## 2020-05-16 DIAGNOSIS — G8929 Other chronic pain: Secondary | ICD-10-CM | POA: Diagnosis not present

## 2020-05-16 DIAGNOSIS — I2699 Other pulmonary embolism without acute cor pulmonale: Secondary | ICD-10-CM | POA: Diagnosis not present

## 2020-05-16 DIAGNOSIS — E870 Hyperosmolality and hypernatremia: Secondary | ICD-10-CM | POA: Diagnosis not present

## 2020-05-16 DIAGNOSIS — Z0389 Encounter for observation for other suspected diseases and conditions ruled out: Secondary | ICD-10-CM | POA: Diagnosis not present

## 2020-05-16 DIAGNOSIS — I69320 Aphasia following cerebral infarction: Secondary | ICD-10-CM | POA: Diagnosis not present

## 2020-05-16 DIAGNOSIS — R41841 Cognitive communication deficit: Secondary | ICD-10-CM | POA: Diagnosis not present

## 2020-05-16 DIAGNOSIS — I161 Hypertensive emergency: Secondary | ICD-10-CM | POA: Diagnosis not present

## 2020-05-16 DIAGNOSIS — C73 Malignant neoplasm of thyroid gland: Secondary | ICD-10-CM | POA: Diagnosis not present

## 2020-05-16 DIAGNOSIS — D72829 Elevated white blood cell count, unspecified: Secondary | ICD-10-CM | POA: Diagnosis not present

## 2020-05-16 DIAGNOSIS — D649 Anemia, unspecified: Secondary | ICD-10-CM | POA: Diagnosis not present

## 2020-05-16 DIAGNOSIS — F330 Major depressive disorder, recurrent, mild: Secondary | ICD-10-CM | POA: Diagnosis not present

## 2020-05-16 DIAGNOSIS — E7800 Pure hypercholesterolemia, unspecified: Secondary | ICD-10-CM | POA: Diagnosis not present

## 2020-05-16 DIAGNOSIS — Z90710 Acquired absence of both cervix and uterus: Secondary | ICD-10-CM | POA: Diagnosis not present

## 2020-05-16 DIAGNOSIS — E440 Moderate protein-calorie malnutrition: Secondary | ICD-10-CM | POA: Diagnosis not present

## 2020-05-16 DIAGNOSIS — Z66 Do not resuscitate: Secondary | ICD-10-CM | POA: Diagnosis not present

## 2020-05-16 DIAGNOSIS — R4701 Aphasia: Secondary | ICD-10-CM | POA: Diagnosis not present

## 2020-05-16 DIAGNOSIS — R2689 Other abnormalities of gait and mobility: Secondary | ICD-10-CM | POA: Diagnosis not present

## 2020-05-16 DIAGNOSIS — I639 Cerebral infarction, unspecified: Secondary | ICD-10-CM | POA: Diagnosis not present

## 2020-05-16 DIAGNOSIS — R109 Unspecified abdominal pain: Secondary | ICD-10-CM | POA: Diagnosis not present

## 2020-05-16 DIAGNOSIS — J9851 Mediastinitis: Secondary | ICD-10-CM | POA: Diagnosis not present

## 2020-05-16 DIAGNOSIS — R270 Ataxia, unspecified: Secondary | ICD-10-CM | POA: Diagnosis not present

## 2020-05-16 DIAGNOSIS — Z5181 Encounter for therapeutic drug level monitoring: Secondary | ICD-10-CM | POA: Diagnosis not present

## 2020-05-16 DIAGNOSIS — I700 Atherosclerosis of aorta: Secondary | ICD-10-CM | POA: Diagnosis not present

## 2020-05-16 DIAGNOSIS — Z7189 Other specified counseling: Secondary | ICD-10-CM | POA: Diagnosis not present

## 2020-05-16 DIAGNOSIS — E872 Acidosis: Secondary | ICD-10-CM | POA: Diagnosis not present

## 2020-05-16 DIAGNOSIS — R627 Adult failure to thrive: Secondary | ICD-10-CM | POA: Diagnosis not present

## 2020-05-16 DIAGNOSIS — Z20822 Contact with and (suspected) exposure to covid-19: Secondary | ICD-10-CM | POA: Diagnosis not present

## 2020-05-16 DIAGNOSIS — G319 Degenerative disease of nervous system, unspecified: Secondary | ICD-10-CM | POA: Diagnosis not present

## 2020-05-16 DIAGNOSIS — I10 Essential (primary) hypertension: Secondary | ICD-10-CM | POA: Diagnosis not present

## 2020-05-17 ENCOUNTER — Telehealth: Payer: Self-pay

## 2020-05-17 NOTE — Telephone Encounter (Signed)
Levada Dy, wound care nurse with [facility] contacted the office about [name]'s wound vac.  Patient is s/p sternal wound closure with Dr. Orvan Seen 05/11/20.  Patient has a Prevena wound vac which is to stay in place for 7 days, or until the battery runs down.  Patient is to keep it in place with no dressing changes until seen in the office.  If the battery should die before seen in the office, the wound care nurse should remove the vac.  Called wound care nurse at the facility and left a message for a return call.

## 2020-05-21 DIAGNOSIS — I25810 Atherosclerosis of coronary artery bypass graft(s) without angina pectoris: Secondary | ICD-10-CM | POA: Diagnosis not present

## 2020-05-21 DIAGNOSIS — I10 Essential (primary) hypertension: Secondary | ICD-10-CM | POA: Diagnosis not present

## 2020-05-21 DIAGNOSIS — F329 Major depressive disorder, single episode, unspecified: Secondary | ICD-10-CM | POA: Diagnosis not present

## 2020-05-21 DIAGNOSIS — E782 Mixed hyperlipidemia: Secondary | ICD-10-CM | POA: Diagnosis not present

## 2020-05-21 DIAGNOSIS — D649 Anemia, unspecified: Secondary | ICD-10-CM | POA: Diagnosis not present

## 2020-05-21 DIAGNOSIS — L89309 Pressure ulcer of unspecified buttock, unspecified stage: Secondary | ICD-10-CM | POA: Diagnosis not present

## 2020-05-21 DIAGNOSIS — E46 Unspecified protein-calorie malnutrition: Secondary | ICD-10-CM | POA: Diagnosis not present

## 2020-05-24 ENCOUNTER — Telehealth: Payer: Self-pay

## 2020-05-24 ENCOUNTER — Ambulatory Visit: Payer: PPO | Admitting: Cardiothoracic Surgery

## 2020-05-24 NOTE — Telephone Encounter (Signed)
Demetria with [facility] [phone] contacted the office to state Ms. Boxx's Prevena wound vac quit working properly.  Ms. Massman is s/p sternal debridement and sternal closure 05/11/2020 with Dr. Orvan Seen.  Advised to remove the wound vac dressing and send a picture of the wound to the office phone.  Picture sent and seem by Dr. Orvan Seen.  Patient is to keep wound clean and dry.  Patient is to be seen back in the office Monday, 7/12 for a post op follow-up.  Nursing facility is aware.

## 2020-05-28 ENCOUNTER — Ambulatory Visit (INDEPENDENT_AMBULATORY_CARE_PROVIDER_SITE_OTHER): Payer: Self-pay | Admitting: Cardiothoracic Surgery

## 2020-05-28 ENCOUNTER — Ambulatory Visit
Admission: RE | Admit: 2020-05-28 | Discharge: 2020-05-28 | Disposition: A | Discharge status: Home / Self Care | Payer: PPO | Source: Ambulatory Visit | Attending: Cardiothoracic Surgery | Admitting: Cardiothoracic Surgery

## 2020-05-28 ENCOUNTER — Other Ambulatory Visit: Payer: Self-pay

## 2020-05-28 ENCOUNTER — Other Ambulatory Visit: Payer: Self-pay | Admitting: Cardiothoracic Surgery

## 2020-05-28 VITALS — BP 140/56 | HR 42 | Temp 97.7°F | Resp 20 | Ht 63.0 in | Wt 131.0 lb

## 2020-05-28 DIAGNOSIS — Z0389 Encounter for observation for other suspected diseases and conditions ruled out: Secondary | ICD-10-CM | POA: Diagnosis not present

## 2020-05-28 DIAGNOSIS — E46 Unspecified protein-calorie malnutrition: Secondary | ICD-10-CM | POA: Diagnosis not present

## 2020-05-28 DIAGNOSIS — E782 Mixed hyperlipidemia: Secondary | ICD-10-CM | POA: Diagnosis not present

## 2020-05-28 DIAGNOSIS — F329 Major depressive disorder, single episode, unspecified: Secondary | ICD-10-CM | POA: Diagnosis not present

## 2020-05-28 DIAGNOSIS — I10 Essential (primary) hypertension: Secondary | ICD-10-CM | POA: Diagnosis not present

## 2020-05-28 DIAGNOSIS — K56609 Unspecified intestinal obstruction, unspecified as to partial versus complete obstruction: Secondary | ICD-10-CM

## 2020-05-28 DIAGNOSIS — D649 Anemia, unspecified: Secondary | ICD-10-CM | POA: Diagnosis not present

## 2020-05-28 DIAGNOSIS — Z951 Presence of aortocoronary bypass graft: Secondary | ICD-10-CM

## 2020-05-28 NOTE — Progress Notes (Signed)
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  This was repaired recently and she returns for postoperative visitation.  She is was discharged from the hospital to a treatment facility but has not had failure to thrive according to her friend who is accompanying her.  Her appetite is poor and she is languishing.   Current Outpatient Medications  Medication Sig Dispense Refill  . acetaminophen (TYLENOL) 325 MG tablet Take 2 tablets (650 mg total) by mouth every 6 (six) hours as needed for mild pain (or Fever >/= 101).    Marland Kitchen aspirin EC 81 MG tablet Take 81 mg by mouth at bedtime.    Marland Kitchen atorvastatin (LIPITOR) 80 MG tablet Take 80 mg by mouth daily.     Marland Kitchen ceFEPime (MAXIPIME) IVPB Inject 2 g into the vein every 12 (twelve) hours. Indication:  Mediastinitis  First Dose: No Last Day of Therapy:  Jun 24, 2020 Labs - Once weekly:  CBC/D and BMP, Labs - Every other week:  ESR and CRP Method of administration: IV Push Method of administration may be changed at the discretion of home infusion pharmacist based upon assessment of the patient and/or caregiver's ability to self-administer the medication ordered. 60 Units 0  . cholecalciferol (VITAMIN D) 1000 UNITS tablet Take 1,000 Units by mouth daily with lunch.     . feeding supplement, ENSURE ENLIVE, (ENSURE ENLIVE) LIQD Take 237 mLs by mouth 4 (four) times daily. 237 mL 12  . levothyroxine (SYNTHROID, LEVOTHROID) 200 MCG tablet Take 100-200 mcg by mouth See admin instructions. Take 1/2 tablet (100 mcg) by mouth on Monday, Wednesday, Friday mornings; take 1 tablet (200 mcg) on Sunday, Tuesday, Thursday, Saturday mornings  DO NOT USE GENERIC    . lisinopril (ZESTRIL) 20 MG  tablet Take 20 mg by mouth daily.     . metoprolol tartrate (LOPRESSOR) 25 MG tablet Take 1 tablet (25 mg total) by mouth 2 (two) times daily. (Patient taking differently: Take 25 mg by mouth 2 (two) times daily with a meal. ) 180 tablet 1  . Prenatal Vit-Fe Fumarate-FA (PRENATAL MULTIVITAMIN) TABS tablet Take 1 tablet by mouth daily with lunch.     . rivaroxaban (XARELTO) 20 MG TABS tablet Take 1 tablet (20 mg total) by mouth daily with supper. (Patient taking differently: Take 20 mg by mouth at bedtime. ) 1 tablet 0  . sertraline (ZOLOFT) 50 MG tablet Take 1 tablet (50 mg total) by mouth at bedtime. (Patient taking differently: Take 25 mg by mouth at bedtime. )    . traMADol (ULTRAM) 50 MG tablet Take 1 tablet (50 mg total) by mouth every 6 (six) hours as needed for severe pain (Pain not mitigated with Tylenol). 28 tablet 0  . vancomycin IVPB Inject 750 mg into the vein daily. Indication:  Mediastinitis First Dose: Yes Last Day of Therapy:  2020-06-24 Labs - Sunday/Monday:  CBC/D, BMP, and vancomycin trough. Labs - Thursday:  BMP and vancomycin trough Labs - Every other week:  ESR and CRP Method of administration:Elastomeric Method of administration may be changed at the discretion of the patient and/or caregiver's ability to self-administer the medication ordered.  31 Units 0  . vitamin B-12 (CYANOCOBALAMIN) 1000 MCG tablet Take 1,000 mcg by mouth daily with lunch.      No current facility-administered medications for this visit.      Physical Exam:   BP (!) 140/56   Pulse (!) 42   Temp 97.7 F (36.5 C) (Temporal)   Resp 20   Ht '5\' 3"'  (1.6 m)   Wt 59.4 kg   SpO2 98%   BMI 23.21 kg/m   General:  Ill appearing but no acute distress  Chest:   Well-healed incision, drains are removed  CV:   Regular rate and rhythm  Incisions:  See above  Abdomen:  Mildly tender  Extremities:  No edema  Diagnostic Tests:  No new labs available   Impression:  Doing well after repair of sternal  dehiscence  Plan:  Follow-up as needed KUB prior to discharge today to assess bowel obstruction Recommend for rehab facility to be aggressive about her rehab including bowel care  I spent in excess of 20 minutes during the conduct of this office consultation and >50% of this time involved direct face-to-face encounter with the patient for counseling and/or coordination of their care.  Level 2                 10 minutes Level 3                 15 minutes Level 4                 25 minutes Level 5                 40 minutes  B. Murvin Natal, MD 05/28/2020 1:07 PM

## 2020-05-30 ENCOUNTER — Ambulatory Visit: Payer: PPO | Admitting: Internal Medicine

## 2020-05-30 DIAGNOSIS — E782 Mixed hyperlipidemia: Secondary | ICD-10-CM | POA: Diagnosis not present

## 2020-05-30 DIAGNOSIS — I10 Essential (primary) hypertension: Secondary | ICD-10-CM | POA: Diagnosis not present

## 2020-05-30 DIAGNOSIS — F329 Major depressive disorder, single episode, unspecified: Secondary | ICD-10-CM | POA: Diagnosis not present

## 2020-05-30 DIAGNOSIS — D72829 Elevated white blood cell count, unspecified: Secondary | ICD-10-CM | POA: Diagnosis not present

## 2020-05-30 DIAGNOSIS — E46 Unspecified protein-calorie malnutrition: Secondary | ICD-10-CM | POA: Diagnosis not present

## 2020-05-30 DIAGNOSIS — I25810 Atherosclerosis of coronary artery bypass graft(s) without angina pectoris: Secondary | ICD-10-CM | POA: Diagnosis not present

## 2020-06-01 ENCOUNTER — Non-Acute Institutional Stay: Payer: Self-pay | Admitting: Internal Medicine

## 2020-06-01 ENCOUNTER — Other Ambulatory Visit: Payer: Self-pay

## 2020-06-01 VITALS — Ht 63.0 in | Wt 126.4 lb

## 2020-06-01 DIAGNOSIS — S21101A Unspecified open wound of right front wall of thorax without penetration into thoracic cavity, initial encounter: Secondary | ICD-10-CM | POA: Diagnosis not present

## 2020-06-01 DIAGNOSIS — Z515 Encounter for palliative care: Secondary | ICD-10-CM

## 2020-06-01 DIAGNOSIS — Z7189 Other specified counseling: Secondary | ICD-10-CM

## 2020-06-01 DIAGNOSIS — I2510 Atherosclerotic heart disease of native coronary artery without angina pectoris: Secondary | ICD-10-CM | POA: Diagnosis not present

## 2020-06-01 DIAGNOSIS — J9851 Mediastinitis: Secondary | ICD-10-CM | POA: Diagnosis not present

## 2020-06-01 DIAGNOSIS — J9611 Chronic respiratory failure with hypoxia: Secondary | ICD-10-CM | POA: Diagnosis not present

## 2020-06-01 NOTE — Progress Notes (Signed)
06/01/2020 [facility] Consult Note Telephone: [phone]  Fax: [phone]  PATIENT NAME: [name] [name] DOB: 09/10/1941 MRN: [id number]  [address] (residence) [place] [address]  PRIMARY CARE PROVIDER:   [name], MD  Evalina Field (Cradiologist)  REFERRING PROVIDER:  [name], [place],  [place] [address]  [place]:  Alanson Puls) Lezlie Lye [phone].   (Friend/Neighbor) Darcel Bayley [phone] [facility]).  Beth Shaffer [phone]  ASSESSMENT / RECOMMENDATIONS:  1. Advance Care Planning: A. Directives: DNR and MOST present on facility chart; I uploaded into [facility] EMR. MOST: DNR/DNI. Scope of Medical Interventions limited to comfort. Yes to Antibiotics and IVF. Tube feedings for a determined level of time. Form signed by facility provider and friend/neighbor of patient (also listed as emergency contact)  B. Goals of Care: Difficult to elicit; patient speaking in only soft few word sentences. Appears very fatigued/weak. Not willing/able to answer goals of care questions at this moment.  2. Cognitive / Functional status,  Patient sleeping upon my arrival, laying on her L side in dark room with [facility] only minimally raised. Breathing easily. Awoke easily to my voice. Able to maintain eye contact; engaging and engaged when spoke to, but soft spoken, minimal few word responses. She has a depressed affect but occasionally cracks a smile. She is on Zoloft 53m/d. Staff report patient has mentioned she has come to terms with Jesus. Staff report that this is patients second rehab stay. Patients affect and function was much better her previous admission.   -consider increase of Zoloft to 719mday.   Staff report patient is totally dependent for all ADLs including bathing, hygiene, and feeding. She is incontinent of bowel and bladder. 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 She  is working with facility PT.   Patient with c/o super pubic pain which is exacerbated by applied pressure, waxes and wanes in intensity. She is s/p KUB 7/12/21which was negative for obstruction. Facility PA has scheduled a f/u CT scan.   Very poor appetite with minimal oral intake of only bites and sips. Her current weight is 126.4 lbs, which is a loss of 4.6 lbs over the last week. At a height of 53 her BMI is 22.4kg/m2. Staff report patient is moving her bowels frequently and normally. I attempted to feed her a spoonful of chicken noodle soup but patient found it unpalatable and spit it out. She was able to drink 12049mf cranberry juice. Patients family brings in Boost from home.  -Recommend start Remeron 32m63m appetite stimulant / antidepressant   Sternal incision site looks well without signs infection or inflammation. Continues IV antibiotics Vancomycin and Cefepime via UE PIC line  3. Family / Community Supports: Single. I plan of calling her listed first contact (neighbor/friend) Dannette J VaFontaine [phone] and plans for post rehab stay.   4. Follow up Palliative Care Visit: Friday 06/07/2020  I spent 60 minutes providing this consultation from 9am to 10am. More than 50% of the time in this consultation was spent coordinating communication.   HISTORY OF PRESENT ILLNESS:  [name] [name] is a 78 y76. female who is s/p CABG March [id number] by post surgical stroke and superficial sternal infection.  H/O HTN, HLD, hypothyroidism, osteoporosis, and thyroid cancer (Synthroid), pulmonary embolism (Xeralto/Aspirin), generalized muscular weakness.  -6/18-6/29/2021 Hospitalized for management sternal wound infection. Surgical debridement x 2 (bilateral  pectoralis flap and sternal closure) -01/27/2020: Hospitalized for CABG complicated by stroke 2 days post op, and by post-surgical sternal wound infection.   Palliative Care was asked  to help address goals of care.   CODE STATUS: DNR  PPS: 30%  HOSPICE ELIGIBILITY/DIAGNOSIS: TBD  PAST MEDICAL HISTORY:  Past Medical History:  Diagnosis Date   Coronary artery disease    Family history of adverse reaction to anesthesia    " My Sister did "   HTN (hypertension)    Hyperlipidemia    Hypothyroidism    Osteoporosis    Stroke ([place])    Thyroid carcinoma ([place])     SOCIAL HX:  Social History   Tobacco Use   Smoking status: Never Smoker   Smokeless tobacco: Never Used  Substance Use Topics   Alcohol use: Not Currently    ALLERGIES:  Allergies  Allergen Reactions   Bee Venom Swelling    Massive swelling   Penicillins Other (See Comments)    ** tolerates Zosyn + cephalosporins Did it involve swelling of the face/tongue/throat, SOB, or low BP? Unknown Did it involve sudden or severe rash/hives, skin peeling, or any reaction on the inside of your mouth or nose? Unknown Did you need to seek medical attention at a hospital or doctor's office? Unknown When did it last happen?1945 If all above answers are "NO", may proceed with cephalosporin use.   Adhesive [Tape] Rash   Morphine And Related Rash   Sulfa Antibiotics Nausea And Vomiting and Rash     PERTINENT MEDICATIONS:  Outpatient Encounter Medications as of 06/01/2020  Medication Sig   acetaminophen (TYLENOL) 325 MG tablet Take 2 tablets (650 mg total) by mouth every 6 (six) hours as needed for mild pain (or Fever >/= 101).   aspirin EC 81 MG tablet Take 81 mg by mouth at bedtime.   atorvastatin (LIPITOR) 80 MG tablet Take 80 mg by mouth daily.    ceFEPime (MAXIPIME) IVPB Inject 2 g into the vein every 12 (twelve) hours. Indication:  Mediastinitis  First Dose: No Last Day of Therapy:  2020-07-14 Labs - Once weekly:  CBC/D and BMP, Labs - Every other week:  ESR and CRP Method of administration: IV Push Method of administration may be changed at the discretion of home infusion  pharmacist based upon assessment of the patient and/or caregiver's ability to self-administer the medication ordered.   cholecalciferol (VITAMIN D) 1000 UNITS tablet Take 1,000 Units by mouth daily with lunch.    feeding supplement, ENSURE ENLIVE, (ENSURE ENLIVE) LIQD Take 237 mLs by mouth 4 (four) times daily.   ferrous sulfate 325 (65 FE) MG EC tablet Take 325 mg by mouth 2 (two) times daily with a meal.   levothyroxine (SYNTHROID, LEVOTHROID) 200 MCG tablet Take 100-200 mcg by mouth See admin instructions. Take 1/2 tablet (100 mcg) by mouth on Monday, Wednesday, Friday mornings; take 1 tablet (200 mcg) on Sunday, Tuesday, Thursday, Saturday mornings  DO NOT USE GENERIC   lisinopril (ZESTRIL) 20 MG tablet Take 20 mg by mouth daily.    metoprolol tartrate (LOPRESSOR) 25 MG tablet Take 1 tablet (25 mg total) by mouth 2 (two) times daily. (Patient taking differently: Take 25 mg by mouth 2 (two) times daily with a meal. )   OVER THE COUNTER MEDICATION Dermacloud ointment  Apply to buttocks bid after incontinent care   Prenatal Vit-Fe Fumarate-FA (PRENATAL MULTIVITAMIN) TABS tablet Take 1 tablet by mouth daily with lunch.    rivaroxaban (XARELTO) 20 MG  TABS tablet Take 1 tablet (20 mg total) by mouth daily with supper. (Patient taking differently: Take 20 mg by mouth at bedtime. )   sertraline (ZOLOFT) 50 MG tablet Take 1 tablet (50 mg total) by mouth at bedtime.   traMADol (ULTRAM) 50 MG tablet Take 1 tablet (50 mg total) by mouth every 6 (six) hours as needed for severe pain (Pain not mitigated with Tylenol).   vancomycin IVPB Inject 750 mg into the vein daily. Indication:  Mediastinitis First Dose: Yes Last Day of Therapy:  17-Jun-2020 Labs - Sunday/Monday:  CBC/D, BMP, and vancomycin trough. Labs - Thursday:  BMP and vancomycin trough Labs - Every other week:  ESR and CRP Method of administration:Elastomeric Method of administration may be changed at the discretion of the patient  and/or caregiver's ability to self-administer the medication ordered.   vitamin B-12 (CYANOCOBALAMIN) 1000 MCG tablet Take 1,000 mcg by mouth daily with lunch.    No facility-administered encounter medications on file as of 06/01/2020.    PHYSICAL EXAM:   General: NAD, frail appearing, thin, soft spoken with minimal 1-2 word responses. A & O Cardiovascular: regular rate and rhythm without RMG Pulmonary: clear ant fields Abdomen: soft, + bowel sounds, Tender to applied pressure suprapubic area Extremities: mild LE edema softly pitting. Skin: no rashes Neurological: Weakness but otherwise non-focal  Julianne Handler, NP

## 2020-06-01 NOTE — Patient Outreach (Signed)
[facility]) Care Management  06/01/2020  CARISA BACKHAUS 1941/04/16 [id number]  First telephone outreach attempt to obtain mRS. CMA spoke with patient's brother who states now was not a good time to speak with patient.  CMA asked could mRs questions be addressed with brother, but he states he vaguely is familiar with sister's health. Both patient and brother recommended call back on Tuesday would be better.   [facility] Management Assistant [phone]

## 2020-06-02 ENCOUNTER — Encounter: Payer: Self-pay | Admitting: Internal Medicine

## 2020-06-03 ENCOUNTER — Other Ambulatory Visit (HOSPITAL_COMMUNITY): Payer: PPO

## 2020-06-03 ENCOUNTER — Other Ambulatory Visit: Payer: Self-pay

## 2020-06-03 ENCOUNTER — Inpatient Hospital Stay (HOSPITAL_COMMUNITY)
Admission: EM | Admit: 2020-06-03 | Discharge: 2020-06-05 | DRG: 682 | Disposition: A | Discharge status: Hospice | Payer: PPO | Source: Skilled Nursing Facility | Attending: Internal Medicine | Admitting: Internal Medicine

## 2020-06-03 ENCOUNTER — Inpatient Hospital Stay (HOSPITAL_COMMUNITY): Payer: PPO

## 2020-06-03 ENCOUNTER — Encounter (HOSPITAL_COMMUNITY): Payer: Self-pay | Admitting: Emergency Medicine

## 2020-06-03 ENCOUNTER — Emergency Department (HOSPITAL_COMMUNITY): Payer: PPO

## 2020-06-03 DIAGNOSIS — L89321 Pressure ulcer of left buttock, stage 1: Secondary | ICD-10-CM | POA: Diagnosis not present

## 2020-06-03 DIAGNOSIS — R1084 Generalized abdominal pain: Secondary | ICD-10-CM | POA: Diagnosis not present

## 2020-06-03 DIAGNOSIS — I69320 Aphasia following cerebral infarction: Secondary | ICD-10-CM | POA: Diagnosis not present

## 2020-06-03 DIAGNOSIS — E785 Hyperlipidemia, unspecified: Secondary | ICD-10-CM | POA: Diagnosis present

## 2020-06-03 DIAGNOSIS — I10 Essential (primary) hypertension: Secondary | ICD-10-CM | POA: Diagnosis present

## 2020-06-03 DIAGNOSIS — Z8249 Family history of ischemic heart disease and other diseases of the circulatory system: Secondary | ICD-10-CM

## 2020-06-03 DIAGNOSIS — Z888 Allergy status to other drugs, medicaments and biological substances status: Secondary | ICD-10-CM

## 2020-06-03 DIAGNOSIS — Z7401 Bed confinement status: Secondary | ICD-10-CM

## 2020-06-03 DIAGNOSIS — Z881 Allergy status to other antibiotic agents status: Secondary | ICD-10-CM

## 2020-06-03 DIAGNOSIS — T8142XA Infection following a procedure, deep incisional surgical site, initial encounter: Secondary | ICD-10-CM | POA: Diagnosis present

## 2020-06-03 DIAGNOSIS — N179 Acute kidney failure, unspecified: Secondary | ICD-10-CM | POA: Diagnosis not present

## 2020-06-03 DIAGNOSIS — R109 Unspecified abdominal pain: Secondary | ICD-10-CM

## 2020-06-03 DIAGNOSIS — Z86718 Personal history of other venous thrombosis and embolism: Secondary | ICD-10-CM

## 2020-06-03 DIAGNOSIS — G9389 Other specified disorders of brain: Secondary | ICD-10-CM | POA: Diagnosis not present

## 2020-06-03 DIAGNOSIS — Z20822 Contact with and (suspected) exposure to covid-19: Secondary | ICD-10-CM | POA: Diagnosis present

## 2020-06-03 DIAGNOSIS — G459 Transient cerebral ischemic attack, unspecified: Secondary | ICD-10-CM | POA: Diagnosis not present

## 2020-06-03 DIAGNOSIS — G319 Degenerative disease of nervous system, unspecified: Secondary | ICD-10-CM | POA: Diagnosis not present

## 2020-06-03 DIAGNOSIS — E860 Dehydration: Secondary | ICD-10-CM | POA: Diagnosis present

## 2020-06-03 DIAGNOSIS — R1033 Periumbilical pain: Secondary | ICD-10-CM | POA: Diagnosis not present

## 2020-06-03 DIAGNOSIS — Z7982 Long term (current) use of aspirin: Secondary | ICD-10-CM

## 2020-06-03 DIAGNOSIS — Z880 Allergy status to penicillin: Secondary | ICD-10-CM

## 2020-06-03 DIAGNOSIS — Q211 Atrial septal defect: Secondary | ICD-10-CM | POA: Diagnosis not present

## 2020-06-03 DIAGNOSIS — Z951 Presence of aortocoronary bypass graft: Secondary | ICD-10-CM

## 2020-06-03 DIAGNOSIS — I2510 Atherosclerotic heart disease of native coronary artery without angina pectoris: Secondary | ICD-10-CM | POA: Diagnosis not present

## 2020-06-03 DIAGNOSIS — I119 Hypertensive heart disease without heart failure: Secondary | ICD-10-CM | POA: Diagnosis not present

## 2020-06-03 DIAGNOSIS — R4781 Slurred speech: Secondary | ICD-10-CM | POA: Diagnosis not present

## 2020-06-03 DIAGNOSIS — Z85850 Personal history of malignant neoplasm of thyroid: Secondary | ICD-10-CM

## 2020-06-03 DIAGNOSIS — Z515 Encounter for palliative care: Secondary | ICD-10-CM | POA: Diagnosis not present

## 2020-06-03 DIAGNOSIS — I6529 Occlusion and stenosis of unspecified carotid artery: Secondary | ICD-10-CM | POA: Diagnosis not present

## 2020-06-03 DIAGNOSIS — G9341 Metabolic encephalopathy: Secondary | ICD-10-CM | POA: Diagnosis not present

## 2020-06-03 DIAGNOSIS — Z803 Family history of malignant neoplasm of breast: Secondary | ICD-10-CM

## 2020-06-03 DIAGNOSIS — F329 Major depressive disorder, single episode, unspecified: Secondary | ICD-10-CM | POA: Diagnosis not present

## 2020-06-03 DIAGNOSIS — Z95828 Presence of other vascular implants and grafts: Secondary | ICD-10-CM

## 2020-06-03 DIAGNOSIS — Z66 Do not resuscitate: Secondary | ICD-10-CM | POA: Diagnosis present

## 2020-06-03 DIAGNOSIS — I700 Atherosclerosis of aorta: Secondary | ICD-10-CM | POA: Diagnosis not present

## 2020-06-03 DIAGNOSIS — Z7189 Other specified counseling: Secondary | ICD-10-CM | POA: Diagnosis not present

## 2020-06-03 DIAGNOSIS — I25810 Atherosclerosis of coronary artery bypass graft(s) without angina pectoris: Secondary | ICD-10-CM | POA: Diagnosis not present

## 2020-06-03 DIAGNOSIS — E870 Hyperosmolality and hypernatremia: Secondary | ICD-10-CM | POA: Diagnosis not present

## 2020-06-03 DIAGNOSIS — G8191 Hemiplegia, unspecified affecting right dominant side: Secondary | ICD-10-CM | POA: Diagnosis not present

## 2020-06-03 DIAGNOSIS — M869 Osteomyelitis, unspecified: Secondary | ICD-10-CM | POA: Diagnosis present

## 2020-06-03 DIAGNOSIS — Z90710 Acquired absence of both cervix and uterus: Secondary | ICD-10-CM | POA: Diagnosis not present

## 2020-06-03 DIAGNOSIS — N2889 Other specified disorders of kidney and ureter: Secondary | ICD-10-CM | POA: Diagnosis present

## 2020-06-03 DIAGNOSIS — I161 Hypertensive emergency: Secondary | ICD-10-CM | POA: Diagnosis present

## 2020-06-03 DIAGNOSIS — E878 Other disorders of electrolyte and fluid balance, not elsewhere classified: Secondary | ICD-10-CM | POA: Diagnosis not present

## 2020-06-03 DIAGNOSIS — E872 Acidosis: Secondary | ICD-10-CM | POA: Diagnosis present

## 2020-06-03 DIAGNOSIS — D649 Anemia, unspecified: Secondary | ICD-10-CM | POA: Diagnosis not present

## 2020-06-03 DIAGNOSIS — E039 Hypothyroidism, unspecified: Secondary | ICD-10-CM | POA: Diagnosis present

## 2020-06-03 DIAGNOSIS — I959 Hypotension, unspecified: Secondary | ICD-10-CM | POA: Diagnosis not present

## 2020-06-03 DIAGNOSIS — G9340 Encephalopathy, unspecified: Secondary | ICD-10-CM | POA: Diagnosis not present

## 2020-06-03 DIAGNOSIS — I6782 Cerebral ischemia: Secondary | ICD-10-CM | POA: Diagnosis not present

## 2020-06-03 DIAGNOSIS — L89311 Pressure ulcer of right buttock, stage 1: Secondary | ICD-10-CM | POA: Diagnosis present

## 2020-06-03 DIAGNOSIS — G8929 Other chronic pain: Secondary | ICD-10-CM | POA: Diagnosis present

## 2020-06-03 DIAGNOSIS — Z86711 Personal history of pulmonary embolism: Secondary | ICD-10-CM

## 2020-06-03 DIAGNOSIS — Z79890 Hormone replacement therapy (postmenopausal): Secondary | ICD-10-CM

## 2020-06-03 DIAGNOSIS — R1030 Lower abdominal pain, unspecified: Secondary | ICD-10-CM | POA: Diagnosis not present

## 2020-06-03 DIAGNOSIS — Z79899 Other long term (current) drug therapy: Secondary | ICD-10-CM

## 2020-06-03 DIAGNOSIS — Z7901 Long term (current) use of anticoagulants: Secondary | ICD-10-CM

## 2020-06-03 DIAGNOSIS — E440 Moderate protein-calorie malnutrition: Secondary | ICD-10-CM | POA: Diagnosis not present

## 2020-06-03 DIAGNOSIS — R270 Ataxia, unspecified: Secondary | ICD-10-CM | POA: Diagnosis not present

## 2020-06-03 DIAGNOSIS — M810 Age-related osteoporosis without current pathological fracture: Secondary | ICD-10-CM | POA: Diagnosis present

## 2020-06-03 DIAGNOSIS — Z885 Allergy status to narcotic agent status: Secondary | ICD-10-CM

## 2020-06-03 DIAGNOSIS — Z9049 Acquired absence of other specified parts of digestive tract: Secondary | ICD-10-CM | POA: Diagnosis not present

## 2020-06-03 DIAGNOSIS — M2550 Pain in unspecified joint: Secondary | ICD-10-CM | POA: Diagnosis not present

## 2020-06-03 DIAGNOSIS — I6389 Other cerebral infarction: Secondary | ICD-10-CM | POA: Diagnosis not present

## 2020-06-03 DIAGNOSIS — C73 Malignant neoplasm of thyroid gland: Secondary | ICD-10-CM | POA: Diagnosis not present

## 2020-06-03 DIAGNOSIS — R627 Adult failure to thrive: Secondary | ICD-10-CM | POA: Diagnosis not present

## 2020-06-03 DIAGNOSIS — I639 Cerebral infarction, unspecified: Secondary | ICD-10-CM | POA: Diagnosis not present

## 2020-06-03 DIAGNOSIS — R531 Weakness: Secondary | ICD-10-CM | POA: Diagnosis not present

## 2020-06-03 DIAGNOSIS — N281 Cyst of kidney, acquired: Secondary | ICD-10-CM | POA: Diagnosis not present

## 2020-06-03 DIAGNOSIS — R609 Edema, unspecified: Secondary | ICD-10-CM | POA: Diagnosis present

## 2020-06-03 DIAGNOSIS — E46 Unspecified protein-calorie malnutrition: Secondary | ICD-10-CM | POA: Diagnosis not present

## 2020-06-03 DIAGNOSIS — Z6822 Body mass index (BMI) 22.0-22.9, adult: Secondary | ICD-10-CM

## 2020-06-03 DIAGNOSIS — Z833 Family history of diabetes mellitus: Secondary | ICD-10-CM

## 2020-06-03 LAB — COMPREHENSIVE METABOLIC PANEL
ALT: 19 U/L (ref 0–44)
AST: 15 U/L (ref 15–41)
Albumin: 2.5 g/dL — ABNORMAL LOW (ref 3.5–5.0)
Alkaline Phosphatase: 91 U/L (ref 38–126)
Anion gap: 10 (ref 5–15)
BUN: 46 mg/dL — ABNORMAL HIGH (ref 8–23)
CO2: 17 mmol/L — ABNORMAL LOW (ref 22–32)
Calcium: 11.8 mg/dL — ABNORMAL HIGH (ref 8.9–10.3)
Chloride: 114 mmol/L — ABNORMAL HIGH (ref 98–111)
Creatinine, Ser: 2.32 mg/dL — ABNORMAL HIGH (ref 0.44–1.00)
GFR calc Af Amer: 23 mL/min — ABNORMAL LOW (ref >60)
GFR calc non Af Amer: 20 mL/min — ABNORMAL LOW (ref >60)
Glucose, Bld: 90 mg/dL (ref 70–99)
Potassium: 3.2 mmol/L — ABNORMAL LOW (ref 3.5–5.1)
Sodium: 141 mmol/L (ref 135–145)
Total Bilirubin: 1.4 mg/dL — ABNORMAL HIGH (ref 0.3–1.2)
Total Protein: 6.3 g/dL — ABNORMAL LOW (ref 6.5–8.1)

## 2020-06-03 LAB — I-STAT VENOUS BLOOD GAS, ED
Acid-base deficit: 7 mmol/L — ABNORMAL HIGH (ref 0.0–2.0)
Bicarbonate: 18.3 mmol/L — ABNORMAL LOW (ref 20.0–28.0)
Calcium, Ion: 1.67 mmol/L (ref 1.15–1.40)
HCT: 25 % — ABNORMAL LOW (ref 36.0–46.0)
Hemoglobin: 8.5 g/dL — ABNORMAL LOW (ref 12.0–15.0)
O2 Saturation: 99 %
Potassium: 3.3 mmol/L — ABNORMAL LOW (ref 3.5–5.1)
Sodium: 146 mmol/L — ABNORMAL HIGH (ref 135–145)
TCO2: 19 mmol/L — ABNORMAL LOW (ref 22–32)
pCO2, Ven: 37 mmHg — ABNORMAL LOW (ref 44.0–60.0)
pH, Ven: 7.302 (ref 7.250–7.430)
pO2, Ven: 129 mmHg — ABNORMAL HIGH (ref 32.0–45.0)

## 2020-06-03 LAB — URINALYSIS, ROUTINE W REFLEX MICROSCOPIC
Bilirubin Urine: NEGATIVE
Glucose, UA: NEGATIVE mg/dL
Hgb urine dipstick: NEGATIVE
Ketones, ur: 5 mg/dL — AB
Leukocytes,Ua: NEGATIVE
Nitrite: NEGATIVE
Protein, ur: 100 mg/dL — AB
Specific Gravity, Urine: 1.012 (ref 1.005–1.030)
pH: 6 (ref 5.0–8.0)

## 2020-06-03 LAB — CBC WITH DIFFERENTIAL/PLATELET
Abs Immature Granulocytes: 0.14 10*3/uL — ABNORMAL HIGH (ref 0.00–0.07)
Basophils Absolute: 0.1 10*3/uL (ref 0.0–0.1)
Basophils Relative: 1 %
Eosinophils Absolute: 0.3 10*3/uL (ref 0.0–0.5)
Eosinophils Relative: 3 %
HCT: 33 % — ABNORMAL LOW (ref 36.0–46.0)
Hemoglobin: 10 g/dL — ABNORMAL LOW (ref 12.0–15.0)
Immature Granulocytes: 1 %
Lymphocytes Relative: 18 %
Lymphs Abs: 1.8 10*3/uL (ref 0.7–4.0)
MCH: 24.6 pg — ABNORMAL LOW (ref 26.0–34.0)
MCHC: 30.3 g/dL (ref 30.0–36.0)
MCV: 81.1 fL (ref 80.0–100.0)
Monocytes Absolute: 0.5 10*3/uL (ref 0.1–1.0)
Monocytes Relative: 5 %
Neutro Abs: 7.4 10*3/uL (ref 1.7–7.7)
Neutrophils Relative %: 72 %
Platelets: 267 10*3/uL (ref 150–400)
RBC: 4.07 MIL/uL (ref 3.87–5.11)
RDW: 15.6 % — ABNORMAL HIGH (ref 11.5–15.5)
WBC: 10.1 10*3/uL (ref 4.0–10.5)
nRBC: 0 % (ref 0.0–0.2)

## 2020-06-03 LAB — TROPONIN I (HIGH SENSITIVITY)
Troponin I (High Sensitivity): 10 ng/L (ref <18)
Troponin I (High Sensitivity): 11 ng/L (ref <18)

## 2020-06-03 LAB — LIPASE, BLOOD: Lipase: 54 U/L — ABNORMAL HIGH (ref 11–51)

## 2020-06-03 LAB — SARS CORONAVIRUS 2 BY RT PCR (HOSPITAL ORDER, PERFORMED IN ~~LOC~~ HOSPITAL LAB): SARS Coronavirus 2: NEGATIVE

## 2020-06-03 LAB — LACTIC ACID, PLASMA: Lactic Acid, Venous: 0.5 mmol/L (ref 0.5–1.9)

## 2020-06-03 MED ORDER — LEVOTHYROXINE SODIUM 100 MCG/5ML IV SOLN
50.0000 ug | INTRAVENOUS | Status: DC
  Administered 2020-06-04: 50 ug via INTRAVENOUS
  Filled 2020-06-03: qty 5

## 2020-06-03 MED ORDER — ENSURE ENLIVE PO LIQD
237.0000 mL | Freq: Four times a day (QID) | ORAL | Status: DC
  Filled 2020-06-03: qty 237

## 2020-06-03 MED ORDER — VANCOMYCIN HCL 750 MG/150ML IV SOLN: 750.0000 mg | INTRAVENOUS | Status: DC

## 2020-06-03 MED ORDER — SODIUM CHLORIDE 0.9 % IV SOLN
2.0000 g | INTRAVENOUS | Status: DC
  Administered 2020-06-03: 2 g via INTRAVENOUS
  Filled 2020-06-03 (×2): qty 2

## 2020-06-03 MED ORDER — SODIUM CHLORIDE 0.9% FLUSH: 10.0000 mL | INTRAVENOUS | Status: DC | PRN

## 2020-06-03 MED ORDER — SODIUM CHLORIDE 0.9 % IV BOLUS
1000.0000 mL | Freq: Once | INTRAVENOUS | Status: AC
  Administered 2020-06-03: 1000 mL via INTRAVENOUS

## 2020-06-03 MED ORDER — SODIUM CHLORIDE 0.9 % IV SOLN: INTRAVENOUS | Status: DC

## 2020-06-03 MED ORDER — ONDANSETRON HCL 4 MG PO TABS: 4.0000 mg | ORAL_TABLET | Freq: Four times a day (QID) | ORAL | Status: DC | PRN

## 2020-06-03 MED ORDER — HEPARIN (PORCINE) 25000 UT/250ML-% IV SOLN
950.0000 [IU]/h | INTRAVENOUS | Status: DC
  Administered 2020-06-03: 950 [IU]/h via INTRAVENOUS
  Filled 2020-06-03: qty 250

## 2020-06-03 MED ORDER — HEPARIN SODIUM (PORCINE) 5000 UNIT/ML IJ SOLN: 5000.0000 [IU] | Freq: Two times a day (BID) | INTRAMUSCULAR | Status: DC

## 2020-06-03 MED ORDER — SODIUM CHLORIDE 0.9 % IV SOLN: INTRAVENOUS | Status: AC

## 2020-06-03 MED ORDER — HYDRALAZINE HCL 20 MG/ML IJ SOLN
5.0000 mg | INTRAMUSCULAR | Status: DC | PRN
  Administered 2020-06-03: 5 mg via INTRAVENOUS
  Filled 2020-06-03 (×2): qty 1

## 2020-06-03 MED ORDER — ASPIRIN 300 MG RE SUPP
300.0000 mg | Freq: Every day | RECTAL | Status: DC
  Administered 2020-06-04: 300 mg via RECTAL
  Filled 2020-06-03 (×3): qty 1

## 2020-06-03 MED ORDER — HYDROMORPHONE HCL 1 MG/ML IJ SOLN
0.5000 mg | INTRAMUSCULAR | Status: DC | PRN
  Administered 2020-06-03: 1 mg via INTRAVENOUS
  Administered 2020-06-04: 0.5 mg via INTRAVENOUS
  Filled 2020-06-03: qty 1
  Filled 2020-06-03: qty 0.5

## 2020-06-03 MED ORDER — SERTRALINE HCL 50 MG PO TABS: 50.0000 mg | ORAL_TABLET | Freq: Every day | ORAL | Status: DC

## 2020-06-03 MED ORDER — ONDANSETRON HCL 4 MG/2ML IJ SOLN
4.0000 mg | Freq: Four times a day (QID) | INTRAMUSCULAR | Status: DC | PRN
  Administered 2020-06-03: 4 mg via INTRAVENOUS
  Filled 2020-06-03: qty 2

## 2020-06-03 MED ORDER — ATORVASTATIN CALCIUM 80 MG PO TABS: 80.0000 mg | ORAL_TABLET | Freq: Every day | ORAL | Status: DC

## 2020-06-03 MED ORDER — CHLORHEXIDINE GLUCONATE CLOTH 2 % EX PADS
6.0000 | MEDICATED_PAD | Freq: Every day | CUTANEOUS | Status: DC
  Administered 2020-06-04: 6 via TOPICAL

## 2020-06-03 MED ORDER — LEVOTHYROXINE SODIUM 100 MCG/5ML IV SOLN
100.0000 ug | INTRAVENOUS | Status: DC
  Administered 2020-06-05: 100 ug via INTRAVENOUS
  Filled 2020-06-03: qty 5

## 2020-06-03 MED ORDER — SODIUM CHLORIDE 0.9% FLUSH
10.0000 mL | Freq: Two times a day (BID) | INTRAVENOUS | Status: DC
  Administered 2020-06-04: 10 mL

## 2020-06-03 MED ORDER — HEPARIN BOLUS VIA INFUSION
2000.0000 [IU] | Freq: Once | INTRAVENOUS | Status: AC
  Administered 2020-06-03: 2000 [IU] via INTRAVENOUS
  Filled 2020-06-03: qty 2000

## 2020-06-03 MED ORDER — LABETALOL HCL 5 MG/ML IV SOLN: 10.0000 mg | INTRAVENOUS | Status: DC | PRN

## 2020-06-03 NOTE — Progress Notes (Signed)
[facility] for Heparin Indication: pulmonary embolus  Allergies  Allergen Reactions  . Bee Venom Swelling    Massive swelling  . Penicillins Other (See Comments)    ** tolerates Zosyn + cephalosporins Did it involve swelling of the face/tongue/throat, SOB, or low BP? Unknown Did it involve sudden or severe rash/hives, skin peeling, or any reaction on the inside of your mouth or nose? Unknown Did you need to seek medical attention at a hospital or doctor's office? Unknown When did it last happen?1945 If all above answers are "NO", may proceed with cephalosporin use.  . Adhesive [Tape] Rash  . Erythromycin Nausea And Vomiting and Rash  . Morphine And Related Rash  . Sulfa Antibiotics Nausea And Vomiting and Rash    Patient Measurements:   Heparin Dosing Weight: 57.3 kg   Vital Signs: Temp: 97.9 F (36.6 C) (07/18 1147) Temp Source: Oral (07/18 1147) BP: 151/56 (07/18 1915) Pulse Rate: 62 (07/18 1915)  Labs: Recent Labs    06/03/20 1239 06/03/20 1530  HGB 10.0*  --   HCT 33.0*  --   PLT 267  --   CREATININE 2.32*  --   TROPONINIHS 10 11    Estimated Creatinine Clearance: 17.1 mL/min (A) (by C-G formula based on SCr of 2.32 mg/dL (H)).   Medical History: Past Medical History:  Diagnosis Date  . Coronary artery disease   . Family history of adverse reaction to anesthesia    " My Sister did "  . HTN (hypertension)   . Hyperlipidemia   . Hypothyroidism   . Osteoporosis   . Stroke ([place])   . Thyroid carcinoma ([facility])     Medications:  Scheduled:  . aspirin  300 mg Rectal Daily  . atorvastatin  80 mg Oral Daily  . feeding supplement (ENSURE ENLIVE)  237 mL Oral QID  . heparin  2,000 Units Intravenous Once  . [START ON 06/04/2020] levothyroxine  50 mcg Intravenous Daily  . sertraline  50 mg Oral QHS    Assessment: Patient is a 43 yof that is being admitted for abdominal pain. The patient had a recent CABG in which  she developed sternum osteo and is currently on cefepime and vancomycin for. The patient also had a DVT earlier this year and has been taking Xarelto at the nursing home. Pharmacy has been asked to dose heparin, cefepime and vancomycin while in patient.   Last dose of Xarelto  was at 2100 on 7/15 Goal of Therapy:  aPTT 66-102 seconds Monitor platelets by anticoagulation protocol: Yes   Anticoagulation Plan:  - Last dose was ~ 3 days ago with this will give a half bolus of Heparin 2000 units IV x 1 dose  - Followed by a  heparin drip @ 950 units/hr  - Will monitor heparin using aPTT until aPTT and Heparin levels correlate - aPTT level in ~ 8 hours  - Monitor patient for s/s of bleeding and CBC while on heparin   Antibiotic Plan - Cefepime 2g IV q24h starting at 2100 as last dose was 7/17 @ 2100  - Patient was receiving Vancomycin 750 q24 hr at the nursing home last dose was on 7/17 @ 1200 - Current renal function would suggest a dose of vancomycin 750 mg Q48h  - Will order the 48hr dose however will order a vancomycin level for prior to the dose to help determine if an extended amount of time is needed to clear the previous doses given PTA -  Monitor patients renal function and urine output  - De-escalate ABX when appropriate   Thank you for allowing pharmacy to be a part of this patient's care   Duanne Limerick PharmD. BCPS  06/03/2020,7:31 PM

## 2020-06-03 NOTE — ED Provider Notes (Signed)
[facility] EMERGENCY DEPARTMENT Provider Note   CSN: [id number] Arrival date & time: 06/03/20  1143     History Chief Complaint  Patient presents with  . Abdominal Pain    [name] [name] is a 79 y.o. female.  Patient is a 79 year old female with history of coronary artery disease status post CABG with postoperative sternal infection.  Patient receiving IV antibiotics through PICC line.  She also has history of hemorrhagic stroke and expressive aphasia.  Patient sent from her extended care facility for evaluation of abdominal pain.  I am told this has been ongoing for the past month, however is worsened over the past few days.  Patient has little additional history secondary to aphasia.  The history is provided by the patient.       Past Medical History:  Diagnosis Date  . Coronary artery disease   . Family history of adverse reaction to anesthesia    " My Sister did "  . HTN (hypertension)   . Hyperlipidemia   . Hypothyroidism   . Osteoporosis   . Stroke ([place])   . Thyroid carcinoma [facility])     Patient Active Problem List   Diagnosis Date Noted  . MRSA infection 05/11/2020  . Pseudomonas infection 05/11/2020  . 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 Normocytic anemia 05/11/2020  . History of pulmonary embolus (PE) 05/11/2020  . Malnutrition of moderate degree 05/09/2020  . Pressure injury of skin 05/04/2020  . Mediastinitis 05/04/2020  . PFO (patent foramen ovale) 04/30/2020  . Sternal wound infection 04/20/2020  . Subglottic edema   . History of open heart surgery   . Pleural effusion on right   . Status post tracheostomy ([place])   . History of CVA (cerebrovascular accident)   . History of thyroid cancer   . Leukocytosis   . Acute blood loss anemia   . Acute hypoxemic respiratory failure ([place])   . Tracheostomy status ([place])   . 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 3-vessel CAD 01/26/2020  . Coronary artery disease of native artery of  native heart with stable angina pectoris ([place])   . Precordial chest pain 06/15/2014  . Carotid stenosis 06/15/2014  . Essential hypertension, benign 06/15/2014    Past Surgical History:  Procedure Laterality Date  . ABDOMINAL HYSTERECTOMY    . ANKLE SURGERY    . APPENDECTOMY    . APPLICATION OF WOUND VAC N/A 04/20/2020   Procedure: APPLICATION OF WOUND VAC;  Surgeon: [name], MD;  [facility];  Service: Thoracic;  Laterality: N/A;  . APPLICATION OF WOUND VAC N/A 05/05/2020   Procedure: APPLICATION OF WOUND VAC;  Surgeon: [name], MD;  Location: [place];  Service: Thoracic;  Laterality: N/A;  . CARDIAC CATHETERIZATION  01/25/2010  . CHOLECYSTECTOMY    . CORONARY ARTERY BYPASS GRAFT N/A 01/27/2020   Procedure: CORONARY ARTERY BYPASS GRAFTING (CABG) x 4 using LIMA to LAD; RIMA to OM1; Left radia sequential PDA and Left PL.;  Surgeon: [name], MD;  [facility];  Service: Open Heart Surgery;  Laterality: N/A;  BILATERAL IMA  . gamma knife    . IR CT HEAD LTD  01/30/2020  . IR PERCUTANEOUS ART THROMBECTOMY/INFUSION INTRACRANIAL INC DIAG ANGIO  01/30/2020  . LEFT HEART CATH AND CORONARY ANGIOGRAPHY N/A 01/26/2020   Procedure: LEFT HEART CATH AND CORONARY ANGIOGRAPHY;  Surgeon: Belva Crome, MD;  Location: [place] CV LAB;  Service: Cardiovascular;  Laterality: N/A;  .  RADIAL ARTERY HARVEST Left 01/27/2020   Procedure: RADIAL ARTERY HARVEST (OPEN HARVEST);  Surgeon: [name], MD;  Location: [place];  Service: Open Heart Surgery;  Laterality: Left;  . RADIOLOGY WITH ANESTHESIA N/A 01/30/2020   Procedure: IR WITH ANESTHESIA;  Surgeon: Radiologist, Medication, MD;  Location: [place];  Service: Radiology;  Laterality: N/A;  . STERNAL WOUND DEBRIDEMENT N/A 04/20/2020   Procedure: STERNAL WOUND DEBRIDEMENT;  Surgeon: [name], MD;  Location: [place];  Service: Thoracic;  Laterality: N/A;  . STERNAL WOUND DEBRIDEMENT N/A 05/05/2020   Procedure: STERNAL WOUND  DEBRIDEMENT;  Surgeon: [name], MD;  Location: [place];  Service: Thoracic;  Laterality: N/A;  . STERNAL WOUND DEBRIDEMENT N/A 05/08/2020   Procedure: STERNAL WOUND DEBRIDEMENT;  Surgeon: [name], MD;  [facility];  Service: Thoracic;  Laterality: N/A;  AND WOUND VAC CHANGE  . STERNAL WOUND DEBRIDEMENT N/A 05/11/2020   Procedure: STERNAL DEBRIDEMENT AND STERNAL CLOSURE;  Surgeon: [name], MD;  [facility];  Service: Thoracic;  Laterality: N/A;  . TEE WITHOUT CARDIOVERSION N/A 01/27/2020   Procedure: TRANSESOPHAGEAL ECHOCARDIOGRAM (TEE);  Surgeon: [name], MD;  Location: [place];  Service: Open Heart Surgery;  Laterality: N/A;  . THYROIDECTOMY    . TOTAL HIP ARTHROPLASTY       OB History   No obstetric history on file.     Family History  Problem Relation Age of Onset  . Breast cancer Mother 57  . Hypertension Mother   . Diabetes Mother   . Heart disease Mother   . Cirrhosis Father   . Cancer Brother   . Heart disease Brother   . Heart attack Sister     Social History   Tobacco Use  . Smoking status: Never Smoker  . Smokeless tobacco: Never Used  Vaping Use  . Vaping Use: Never used  Substance Use Topics  . Alcohol use: Not Currently  . Drug use: Never    Home Medications Prior to Admission medications   Medication Sig Start Date End Date Taking? Authorizing Provider  acetaminophen (TYLENOL) 325 MG tablet Take 2 tablets (650 mg total) by mouth every 6 (six) hours as needed for mild pain (or Fever >/= 101). 05/14/20   Barrett, Lodema Hong, PA-C  aspirin EC 81 MG tablet Take 81 mg by mouth at bedtime.    [provider]  atorvastatin (LIPITOR) 80 MG tablet Take 80 mg by mouth daily.     [provider]  ceFEPime (MAXIPIME) IVPB Inject 2 g into the vein every 12 (twelve) hours. Indication:  Mediastinitis  First Dose: No Last Day of Therapy:  2020-07-03 Labs - Once weekly:  CBC/D and BMP, Labs - Every other week:  ESR and  CRP Method of administration: IV Push Method of administration may be changed at the discretion of home infusion pharmacist based upon assessment of the patient and/or caregiver's ability to self-administer the medication ordered. 05/15/20 03-Jul-2020  [name], MD  cholecalciferol (VITAMIN D) 1000 UNITS tablet Take 1,000 Units by mouth daily with lunch.     [provider]  feeding supplement, ENSURE ENLIVE, (ENSURE ENLIVE) LIQD Take 237 mLs by mouth 4 (four) times daily. 05/14/20   Barrett, Erin R, PA-C  ferrous sulfate 325 (65 FE) MG EC tablet Take 325 mg by mouth 2 (two) times daily with a meal.    [provider]  levothyroxine (SYNTHROID, LEVOTHROID) 200 MCG tablet Take 100-200 mcg by  mouth See admin instructions. Take 1/2 tablet (100 mcg) by mouth on Monday, Wednesday, Friday mornings; take 1 tablet (200 mcg) on Sunday, Tuesday, Thursday, Saturday mornings  DO NOT USE GENERIC    [provider]  lisinopril (ZESTRIL) 20 MG tablet Take 20 mg by mouth daily.     [provider]  metoprolol tartrate (LOPRESSOR) 25 MG tablet Take 1 tablet (25 mg total) by mouth 2 (two) times daily. Patient taking differently: Take 25 mg by mouth 2 (two) times daily with a meal.  01/04/20 06/02/21  [name], [name], MD  OVER THE COUNTER MEDICATION [place] ointment  Apply to buttocks bid after incontinent care    [provider]  Prenatal Vit-Fe Fumarate-FA (PRENATAL MULTIVITAMIN) TABS tablet Take 1 tablet by mouth daily with lunch.     [provider]  rivaroxaban (XARELTO) 20 MG TABS tablet Take 1 tablet (20 mg total) by mouth daily with supper. Patient taking differently: Take 20 mg by mouth at bedtime.  04/30/20   Garvin Fila, MD  sertraline (ZOLOFT) 50 MG tablet Take 1 tablet (50 mg total) by mouth at bedtime. 02/20/20   Barrett, Erin R, PA-C  traMADol (ULTRAM) 50 MG tablet Take 1 tablet (50 mg total) by mouth every 6 (six) hours as needed for  severe pain (Pain not mitigated with Tylenol). 05/15/20   [name], PA-C  vancomycin IVPB Inject 750 mg into the vein daily. Indication:  Mediastinitis First Dose: Yes Last Day of Therapy:  Jul 02, 2020 Labs - Sunday/Monday:  CBC/D, BMP, and vancomycin trough. Labs - Thursday:  BMP and vancomycin trough Labs - Every other week:  ESR and CRP Method of administration:Elastomeric Method of administration may be changed at the discretion of the patient and/or caregiver's ability to self-administer the medication ordered. 05/15/20 06/15/20  John Giovanni, PA-C  vitamin B-12 (CYANOCOBALAMIN) 1000 MCG tablet Take 1,000 mcg by mouth daily with lunch.     [provider]    Allergies    Bee venom, Penicillins, Adhesive [tape], Morphine and related, and Sulfa antibiotics  Review of Systems   Review of Systems  Unable to perform ROS: Patient nonverbal    Physical Exam Updated Vital Signs BP (!) 200/60   Pulse (!) 55   Temp 97.9 F (36.6 C) (Oral)   Resp 19   SpO2 99%   Physical Exam Vitals and nursing note reviewed.  Constitutional:      General: She is not in acute distress.    Appearance: She is well-developed. She is not diaphoretic.  HENT:     Head: Normocephalic and atraumatic.  Cardiovascular:     Rate and Rhythm: Normal rate and regular rhythm.     Heart sounds: No murmur heard.  No friction rub. No gallop.   Pulmonary:     Effort: Pulmonary effort is normal. No respiratory distress.     Breath sounds: Normal breath sounds. No wheezing.  Abdominal:     General: Bowel sounds are normal. There is no distension.     Palpations: Abdomen is soft.     Tenderness: There is no abdominal tenderness. There is no right CVA tenderness, left CVA tenderness, guarding or rebound.     Comments: There is generalized abdominal tenderness.  There is no rebound or guarding.  Musculoskeletal:        General: Normal range of motion.     Cervical back: Normal range of motion and neck  supple.  Skin:    General: Skin  is warm and dry.  Neurological:     Mental Status: She is alert and oriented to person, place, and time.     ED Results / Procedures / Treatments   Labs (all labs ordered are listed, but only abnormal results are displayed) Labs Reviewed - No data to display  EKG None  Radiology No results found.  Procedures Procedures (including critical care time)  Medications Ordered in ED Medications - No data to display  ED Course  I have reviewed the triage vital signs and the nursing notes.  Pertinent labs & imaging results that were available during my care of the patient were reviewed by me and considered in my medical decision making (see chart for details).    MDM Rules/Calculators/A&P  Patient sent from her extended care facility for evaluation of abdominal pain and decreased responsiveness.  Patient is nonverbal and adds little additional history.  Patient did have a her neighbor and close friend present at bedside during part of her visit.  She has told me that she has noted Mrs. Been to have a significant change over the past several days.  She is normally able to have some degree of conversation, however she is not responding now at all.  On exam, vitals are stable and she is afebrile.  Her work-up reveals a worsening of her creatinine and BUN.  Patient given normal saline for this.  Laboratory studies otherwise unremarkable.  CT scan of the head was obtained due to her history of stroke and worsening mental status.  This was negative.  She also had an abdominal CT performed which showed no acute process.  At this point, patient will be admitted to the hospitalist service due to her worsening renal function.  Final Clinical Impression(s) / ED Diagnoses Final diagnoses:  None    Rx / DC Orders ED Discharge Orders    None       Veryl Speak, MD 06/03/20 1918

## 2020-06-03 NOTE — ED Notes (Signed)
Patient transported to Ultrasound 

## 2020-06-03 NOTE — Progress Notes (Deleted)
Cardiology Office Note:   Date:  06/03/2020  NAME:  [name] [name]    MRN: [id number] DOB:  11-23-1940   PCP:  [name], MD  Cardiologist:  Evalina Field, MD  Electrophysiologist:  None   Referring MD: [name], MD   No chief complaint on file. ***  History of Present Illness:   [name] [name] is a 79 y.o. female with a hx of CAD, HLD, CVA who presents for follow-up. Last seen in March. Had 3v CAD on CCTA and underwent CABG. Postop course complicated by CVA and PE. Underwent thrombectomy. Course further complicated by sternal wound infection.   Problem List 1. CAD s/p CABG x 4 (01/27/2020) -LIMA-LAD, L radial to PDA/PLV, RIMA-OM1 -sternal wound infection/dehiscence (debridement x 2 04/20/2020) 2. L MCA CVA s/p thrombectomy  -2/2 CABG 3. Pulmonary embolism  -bilateral acute subsegmental PE 01/30/2020 4. PFO 5. HLD T chol 140, HDL 54, LDL 60, TG 133) 6. Carotid artery disease -R/L ICA 40-59% 7. Benign brain mass   Past Medical History: Past Medical History:  Diagnosis Date  . Coronary artery disease   . Family history of adverse reaction to anesthesia    " My Sister did "  . HTN (hypertension)   . Hyperlipidemia   . Hypothyroidism   . Osteoporosis   . Stroke ([place])   . Thyroid carcinoma [facility])     Past Surgical History: Past Surgical History:  Procedure Laterality Date  . ABDOMINAL HYSTERECTOMY    . ANKLE SURGERY    . APPENDECTOMY    . APPLICATION OF WOUND VAC N/A 04/20/2020   Procedure: APPLICATION OF WOUND VAC;  Surgeon: [name], MD;  [facility];  Service: Thoracic;  Laterality: N/A;  . APPLICATION OF WOUND VAC N/A 05/05/2020   Procedure: APPLICATION OF WOUND VAC;  Surgeon: [name], MD;  Location: [place];  Service: Thoracic;  Laterality: N/A;  . CARDIAC CATHETERIZATION  01/25/2010  . CHOLECYSTECTOMY    . CORONARY ARTERY BYPASS GRAFT N/A 01/27/2020   Procedure: CORONARY ARTERY BYPASS GRAFTING (CABG) x 4 using LIMA to LAD;  RIMA to OM1; Left radia sequential PDA and Left PL.;  Surgeon: [name], MD;  [facility];  Service: Open Heart Surgery;  Laterality: N/A;  BILATERAL IMA  . gamma knife    . IR CT HEAD LTD  01/30/2020  . IR PERCUTANEOUS ART THROMBECTOMY/INFUSION INTRACRANIAL INC DIAG ANGIO  01/30/2020  . LEFT HEART CATH AND CORONARY ANGIOGRAPHY N/A 01/26/2020   Procedure: LEFT HEART CATH AND CORONARY ANGIOGRAPHY;  Surgeon: Belva Crome, MD;  Location: [place] CV LAB;  Service: Cardiovascular;  Laterality: N/A;  . RADIAL ARTERY HARVEST Left 01/27/2020   Procedure: RADIAL ARTERY HARVEST (OPEN HARVEST);  Surgeon: [name], MD;  Location: [place];  Service: Open Heart Surgery;  Laterality: Left;  . RADIOLOGY WITH ANESTHESIA N/A 01/30/2020   Procedure: IR WITH ANESTHESIA;  Surgeon: Radiologist, Medication, MD;  Location: [place];  Service: Radiology;  Laterality: N/A;  . STERNAL WOUND DEBRIDEMENT N/A 04/20/2020   Procedure: STERNAL WOUND DEBRIDEMENT;  Surgeon: [name], MD;  Location: [place];  Service: Thoracic;  Laterality: N/A;  . STERNAL WOUND DEBRIDEMENT N/A 05/05/2020   Procedure: STERNAL WOUND DEBRIDEMENT;  Surgeon: [name], MD;  Location: [place];  Service: Thoracic;  Laterality: N/A;  . STERNAL WOUND DEBRIDEMENT N/A 05/08/2020   Procedure: STERNAL WOUND DEBRIDEMENT;  Surgeon: [name], MD;  Location: [place];  Service: Thoracic;  Laterality: N/A;  AND WOUND VAC CHANGE  . STERNAL WOUND DEBRIDEMENT N/A 05/11/2020   Procedure: STERNAL DEBRIDEMENT AND STERNAL CLOSURE;  Surgeon: [name], MD;  [facility];  Service: Thoracic;  Laterality: N/A;  . TEE WITHOUT CARDIOVERSION N/A 01/27/2020   Procedure: TRANSESOPHAGEAL ECHOCARDIOGRAM (TEE);  Surgeon: [name], MD;  Location: [place];  Service: Open Heart Surgery;  Laterality: N/A;  . THYROIDECTOMY    . TOTAL HIP ARTHROPLASTY      Current Medications: No outpatient medications have been marked as taking for  the 06/05/20 encounter (Appointment) with Geralynn Rile, MD.     Allergies:    Bee venom, Penicillins, Adhesive [tape], Morphine and related, and Sulfa antibiotics   Social History: Social History   Socioeconomic History  . Marital status: Divorced    Spouse name: Not on file  . Number of children: Not on file  . Years of education: Not on file  . Highest education level: Not on file  Occupational History  . Occupation: retired Designer, jewellery  Tobacco Use  . Smoking status: Never Smoker  . Smokeless tobacco: Never Used  Vaping Use  . Vaping Use: Never used  Substance and Sexual Activity  . Alcohol use: Not Currently  . Drug use: Never  . Sexual activity: Not on file  Other Topics Concern  . Not on file  Social History Narrative  . Not on file   Social Determinants of Health   Financial Resource Strain:   . Difficulty of Paying Living Expenses:   Food Insecurity:   . Worried About Charity fundraiser in the Last Year:   . Arboriculturist in the Last Year:   Transportation Needs:   . Film/video editor (Medical):   Marland Kitchen Lack of Transportation (Non-Medical):   Physical Activity:   . Days of Exercise per Week:   . Minutes of Exercise per Session:   Stress:   . Feeling of Stress :   Social Connections:   . Frequency of Communication with Friends and Family:   . Frequency of Social Gatherings with Friends and Family:   . Attends Religious Services:   . Active Member of Clubs or Organizations:   . Attends Archivist Meetings:   Marland Kitchen Marital Status:      Family History: The patient's ***family history includes Breast cancer (age of onset: 37) in her mother; Cancer in her brother; Cirrhosis in her father; Diabetes in her mother; Heart attack in her sister; Heart disease in her brother and mother; Hypertension in her mother.  ROS:   All other ROS reviewed and negative. Pertinent positives noted in the HPI.     EKGs/Labs/Other Studies Reviewed:     The following studies were personally reviewed by me today:  EKG:  EKG is *** ordered today.  The ekg ordered today demonstrates ***, and was personally reviewed by me.   Recent Labs: 02/06/2020: Magnesium 1.8 06/03/2020: ALT 19; BUN 46; Creatinine, Ser 2.32; Hemoglobin 10.0; Platelets 267; Potassium 3.2; Sodium 141   Recent Lipid Panel    Component Value Date/Time   CHOL 81 01/31/2020 0550   TRIG 90 01/31/2020 0550   HDL 35 (L) 01/31/2020 0550   CHOLHDL 2.3 01/31/2020 0550   VLDL 18 01/31/2020 0550   LDLCALC 28 01/31/2020 0550    Physical Exam:   VS:  There were no vitals taken for this visit.   Wt Readings from Last 3 Encounters:  06/02/20  126 lb 6.4 oz (57.3 kg)  05/28/20 131 lb (59.4 kg)  05/15/20 144 lb 6.4 oz (65.5 kg)    General: Well nourished, well developed, in no acute distress Heart: Atraumatic, normal size  Eyes: PEERLA, EOMI  Neck: Supple, no JVD Endocrine: No thryomegaly Cardiac: Normal S1, S2; RRR; no murmurs, rubs, or gallops Lungs: Clear to auscultation bilaterally, no wheezing, rhonchi or rales  Abd: Soft, nontender, no hepatomegaly  Ext: No edema, pulses 2+ Musculoskeletal: No deformities, BUE and BLE strength normal and equal Skin: Warm and dry, no rashes   Neuro: Alert and oriented to person, place, time, and situation, CNII-XII grossly intact, no focal deficits  Psych: Normal mood and affect   ASSESSMENT:   [name] [name] is a 79 y.o. female who presents for the following: No diagnosis found.  PLAN:   There are no diagnoses linked to this encounter.  Disposition: No follow-ups on file.  Medication Adjustments/Labs and Tests Ordered: Current medicines are reviewed at length with the patient today.  Concerns regarding medicines are outlined above.  No orders of the defined types were placed in this encounter.  No orders of the defined types were placed in this encounter.   There are no Patient Instructions on file for this visit.    Time Spent with Patient: I have spent a total of *** minutes with patient reviewing hospital notes, telemetry, EKGs, labs and examining the patient as well as establishing an assessment and plan that was discussed with the patient.  > 50% of time was spent in direct patient care.  Signed, Addison Naegeli. Audie Box, [place]  [address], [place],  [address] [phone]  06/03/2020 5:48 PM

## 2020-06-03 NOTE — Progress Notes (Signed)
Pt arrived to unit

## 2020-06-03 NOTE — H&P (Addendum)
History and Physical    [name] [name] OVZ:[id number] DOB: Mar 24, 1941 DOA: 06/03/2020  PCP: [name], MD (Confirm with patient/family/NH records and if not entered, this has to be entered at [facility] point of entry) Patient coming from: Home  I have personally briefly reviewed patient's old medical records in [place]  Chief Complaint: Home  HPI: [name] [name] is a 79 y.o. 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  Patient was discharged to rehab facility for IV antibiotics and rehab.  She has an worsening of abdominal pain started about 1 month ago.  And friend report patient has not been eating and drinking for about with abdominal pain worsened 3 days. And she was only able to have some Ensure since the whole week and apparently losing weight. She was taken to see her vascular surgeon 6 days ago for similar problem and x-ray at that time showed no signs of SBO.  However patient currently cannot answer any questions about her bowel habit whether she has diarrhea or constipation.  Also her friend at bedside guessing she probably has not had any BM since last week due to lacking of food intake. ED Course: CT abdomen without contrast showed bilateral kidney stranding but no other acute findings.  Creatinine 2.3, bicarb 17, potassium 3.2, WBC 10.1, troponin 10>11.  Review of Systems: Unable to perform patient nonverbal   Past Medical History:  Diagnosis Date  . Coronary artery disease   . Family history of adverse reaction to anesthesia    " My Sister did "  . HTN (hypertension)   . Hyperlipidemia   . Hypothyroidism   . Osteoporosis   . Stroke ([place])   . Thyroid carcinoma [facility])     Past Surgical History:  Procedure Laterality Date  . ABDOMINAL HYSTERECTOMY    . ANKLE SURGERY    . APPENDECTOMY    . APPLICATION OF WOUND VAC  N/A 04/20/2020   Procedure: APPLICATION OF WOUND VAC;  Surgeon: [name], MD;  [facility];  Service: Thoracic;  Laterality: N/A;  . APPLICATION OF WOUND VAC N/A 05/05/2020   Procedure: APPLICATION OF WOUND VAC;  Surgeon: [name], MD;  Location: [place];  Service: Thoracic;  Laterality: N/A;  . CARDIAC CATHETERIZATION  01/25/2010  . CHOLECYSTECTOMY    . CORONARY ARTERY BYPASS GRAFT N/A 01/27/2020   Procedure: CORONARY ARTERY BYPASS GRAFTING (CABG) x 4 using LIMA to LAD; RIMA to OM1; Left radia sequential PDA and Left PL.;  Surgeon: [name], MD;  Location: [facility];  Service: Open Heart Surgery;  Laterality: N/A;  BILATERAL IMA  . gamma knife    . IR CT HEAD LTD  01/30/2020  . IR PERCUTANEOUS ART THROMBECTOMY/INFUSION INTRACRANIAL INC DIAG ANGIO  01/30/2020  . LEFT HEART CATH AND CORONARY ANGIOGRAPHY N/A 01/26/2020   Procedure: LEFT HEART CATH AND CORONARY ANGIOGRAPHY;  Surgeon: Belva Crome, MD;  Location: [place] [facility];  Service: Cardiovascular;  Laterality: N/A;  . RADIAL ARTERY HARVEST Left 01/27/2020   Procedure: RADIAL ARTERY HARVEST (OPEN HARVEST);  Surgeon: [name], MD;  Location: [place];  Service: Open Heart Surgery;  Laterality: Left;  . RADIOLOGY WITH ANESTHESIA N/A 01/30/2020   Procedure: IR WITH ANESTHESIA;  Surgeon: Radiologist, Medication, MD;  Location: [place];  Service: Radiology;  Laterality: N/A;  . STERNAL WOUND DEBRIDEMENT  N/A 04/20/2020   Procedure: STERNAL WOUND DEBRIDEMENT;  Surgeon: [name], MD;  Location: [place];  Service: Thoracic;  Laterality: N/A;  . STERNAL WOUND DEBRIDEMENT N/A 05/05/2020   Procedure: STERNAL WOUND DEBRIDEMENT;  Surgeon: [name], MD;  Location: [place];  Service: Thoracic;  Laterality: N/A;  . STERNAL WOUND DEBRIDEMENT N/A 05/08/2020   Procedure: STERNAL WOUND DEBRIDEMENT;  Surgeon: [name], MD;  [facility];  Service: Thoracic;  Laterality: N/A;  AND WOUND VAC CHANGE  . STERNAL WOUND  DEBRIDEMENT N/A 05/11/2020   Procedure: STERNAL DEBRIDEMENT AND STERNAL CLOSURE;  Surgeon: [name], MD;  [facility];  Service: Thoracic;  Laterality: N/A;  . TEE WITHOUT CARDIOVERSION N/A 01/27/2020   Procedure: TRANSESOPHAGEAL ECHOCARDIOGRAM (TEE);  Surgeon: [name], MD;  Location: [place];  Service: Open Heart Surgery;  Laterality: N/A;  . THYROIDECTOMY    . TOTAL HIP ARTHROPLASTY       reports that she has never smoked. She has never used smokeless tobacco. She reports previous alcohol use. She reports that she does not use drugs.  Allergies  Allergen Reactions  . Bee Venom Swelling    Massive swelling  . Penicillins Other (See Comments)    ** tolerates Zosyn + cephalosporins Did it involve swelling of the face/tongue/throat, SOB, or low BP? Unknown Did it involve sudden or severe rash/hives, skin peeling, or any reaction on the inside of your mouth or nose? Unknown Did you need to seek medical attention at a hospital or doctor's office? Unknown When did it last happen?1945 If all above answers are "NO", may proceed with cephalosporin use.  . Adhesive [Tape] Rash  . Morphine And Related Rash  . Sulfa Antibiotics Nausea And Vomiting and Rash    Family History  Problem Relation Age of Onset  . Breast cancer Mother 61  . Hypertension Mother   . Diabetes Mother   . Heart disease Mother   . Cirrhosis Father   . Cancer Brother   . Heart disease Brother   . Heart attack Sister      Prior to Admission medications   Medication Sig Start Date End Date Taking? Authorizing Provider  acetaminophen (TYLENOL) 325 MG tablet Take 2 tablets (650 mg total) by mouth every 6 (six) hours as needed for mild pain (or Fever >/= 101). 05/14/20   Barrett, Lodema Hong, PA-C  aspirin EC 81 MG tablet Take 81 mg by mouth at bedtime.    [provider]  atorvastatin (LIPITOR) 80 MG tablet Take 80 mg by mouth daily.     [provider]  ceFEPime (MAXIPIME) IVPB  Inject 2 g into the vein every 12 (twelve) hours. Indication:  Mediastinitis  First Dose: No Last Day of Therapy:  07/08/2020 Labs - Once weekly:  CBC/D and BMP, Labs - Every other week:  ESR and CRP Method of administration: IV Push Method of administration may be changed at the discretion of home infusion pharmacist based upon assessment of the patient and/or caregiver's ability to self-administer the medication ordered. 05/15/20 07/08/2020  [name], MD  cholecalciferol (VITAMIN D) 1000 UNITS tablet Take 1,000 Units by mouth daily with lunch.     [provider]  feeding supplement, ENSURE ENLIVE, (ENSURE ENLIVE) LIQD Take 237 mLs by mouth 4 (four) times daily. 05/14/20   Barrett, Erin R, PA-C  ferrous sulfate 325 (65 FE) MG EC tablet Take 325 mg by mouth 2 (two) times daily with a meal.  [provider]  levothyroxine (SYNTHROID, LEVOTHROID) 200 MCG tablet Take 100-200 mcg by mouth See admin instructions. Take 1/2 tablet (100 mcg) by mouth on Monday, Wednesday, Friday mornings; take 1 tablet (200 mcg) on Sunday, Tuesday, Thursday, Saturday mornings  DO NOT USE GENERIC    [provider]  lisinopril (ZESTRIL) 20 MG tablet Take 20 mg by mouth daily.     [provider]  metoprolol tartrate (LOPRESSOR) 25 MG tablet Take 1 tablet (25 mg total) by mouth 2 (two) times daily. Patient taking differently: Take 25 mg by mouth 2 (two) times daily with a meal.  01/04/20 06/02/21  [name], [name], MD  OVER THE COUNTER MEDICATION [place] ointment  Apply to buttocks bid after incontinent care    [provider]  Prenatal Vit-Fe Fumarate-FA (PRENATAL MULTIVITAMIN) TABS tablet Take 1 tablet by mouth daily with lunch.     [provider]  rivaroxaban (XARELTO) 20 MG TABS tablet Take 1 tablet (20 mg total) by mouth daily with supper. Patient taking differently: Take 20 mg by mouth at bedtime.  04/30/20   Garvin Fila, MD  sertraline (ZOLOFT) 50  MG tablet Take 1 tablet (50 mg total) by mouth at bedtime. 02/20/20   Barrett, Erin R, PA-C  traMADol (ULTRAM) 50 MG tablet Take 1 tablet (50 mg total) by mouth every 6 (six) hours as needed for severe pain (Pain not mitigated with Tylenol). 05/15/20   [name], PA-C  vancomycin IVPB Inject 750 mg into the vein daily. Indication:  Mediastinitis First Dose: Yes Last Day of Therapy:  07-02-20 Labs - Sunday/Monday:  CBC/D, BMP, and vancomycin trough. Labs - Thursday:  BMP and vancomycin trough Labs - Every other week:  ESR and CRP Method of administration:Elastomeric Method of administration may be changed at the discretion of the patient and/or caregiver's ability to self-administer the medication ordered. 05/15/20 06/15/20  John Giovanni, PA-C  vitamin B-12 (CYANOCOBALAMIN) 1000 MCG tablet Take 1,000 mcg by mouth daily with lunch.     [provider]    Physical Exam: Vitals:   06/03/20 1615 06/03/20 1630 06/03/20 1645 06/03/20 1730  BP: (!) 205/71 (!) 209/82 (!) 207/68 (!) 208/72  Pulse: 63 63 62 65  Resp: '17 17 17 18  ' Temp:      TempSrc:      SpO2: 100% 98% 97% 99%    Constitutional: NAD, calm, comfortable Vitals:   06/03/20 1615 06/03/20 1630 06/03/20 1645 06/03/20 1730  BP: (!) 205/71 (!) 209/82 (!) 207/68 (!) 208/72  Pulse: 63 63 62 65  Resp: '17 17 17 18  ' Temp:      TempSrc:      SpO2: 100% 98% 97% 99%   Eyes: PERRL, lids and conjunctivae normal ENMT: Mucous membranes are dry. Posterior pharynx clear of any exudate or lesions.Normal dentition.  Neck: normal, supple, no masses, no thyromegaly Respiratory: clear to auscultation bilaterally, no wheezing, no crackles. Normal respiratory effort. No accessory muscle use.  Cardiovascular: Regular rate and rhythm, no murmurs / rubs / gallops. No extremity edema. 2+ pedal pulses. No carotid bruits. B/L equal femoral pulses Abdomen: Severe tenderness on minimal contact on the right aspect of abd, no rebound, no masses  palpated. No hepatosplenomegaly. Bowel sounds positive.  Musculoskeletal: no clubbing / cyanosis. No joint deformity upper and lower extremities. Good ROM, no contractures. Normal muscle tone.  Skin: no rashes, lesions, ulcers. No induration Neurologic: Following simple command, moving all limbs Psychiatric: Calm, but not talking  Labs on Admission: I have personally reviewed following labs and imaging studies  CBC: Recent Labs  Lab 06/03/20 1239  WBC 10.1  NEUTROABS 7.4  HGB 10.0*  HCT 33.0*  MCV 81.1  PLT 497   Basic Metabolic Panel: Recent Labs  Lab 06/03/20 1239  NA 141  K 3.2*  CL 114*  CO2 17*  GLUCOSE 90  BUN 46*  CREATININE 2.32*  CALCIUM 11.8*   GFR: Estimated Creatinine Clearance: 17.1 mL/min (A) (by C-G formula based on SCr of 2.32 mg/dL (H)). Liver Function Tests: Recent Labs  Lab 06/03/20 1239  AST 15  ALT 19  ALKPHOS 91  BILITOT 1.4*  PROT 6.3*  ALBUMIN 2.5*   Recent Labs  Lab 06/03/20 1239  LIPASE 54*   No results for input(s): AMMONIA in the last 168 hours. Coagulation Profile: No results for input(s): INR, PROTIME in the last 168 hours. Cardiac Enzymes: No results for input(s): CKTOTAL, CKMB, CKMBINDEX, TROPONINI in the last 168 hours. BNP (last 3 results) No results for input(s): PROBNP in the last 8760 hours. HbA1C: No results for input(s): HGBA1C in the last 72 hours. CBG: No results for input(s): GLUCAP in the last 168 hours. Lipid Profile: No results for input(s): CHOL, HDL, LDLCALC, TRIG, CHOLHDL, LDLDIRECT in the last 72 hours. Thyroid Function Tests: No results for input(s): TSH, T4TOTAL, FREET4, T3FREE, THYROIDAB in the last 72 hours. Anemia Panel: No results for input(s): VITAMINB12, FOLATE, FERRITIN, TIBC, IRON, RETICCTPCT in the last 72 hours. Urine analysis:    Component Value Date/Time   COLORURINE STRAW (A) 06/03/2020 1625   APPEARANCEUR CLEAR 06/03/2020 1625   LABSPEC 1.012 06/03/2020 1625   PHURINE 6.0  06/03/2020 1625   GLUCOSEU NEGATIVE 06/03/2020 1625   HGBUR NEGATIVE 06/03/2020 1625   BILIRUBINUR NEGATIVE 06/03/2020 1625   KETONESUR 5 (A) 06/03/2020 1625   PROTEINUR 100 (A) 06/03/2020 1625   NITRITE NEGATIVE 06/03/2020 1625   LEUKOCYTESUR NEGATIVE 06/03/2020 1625    Radiological Exams on Admission: CT ABDOMEN PELVIS WO CONTRAST  Result Date: 06/03/2020 CLINICAL DATA:  Worsening abdominal pain. EXAM: CT ABDOMEN AND PELVIS WITHOUT CONTRAST TECHNIQUE: Multidetector CT imaging of the abdomen and pelvis was performed following the standard protocol without IV contrast. COMPARISON:  None. FINDINGS: Lower chest: Plate and screw fixation of the mediastinum. Abandoned epicardial wires noted. No pericardial effusion. Mildly enlarged heart. Hepatobiliary: No focal liver abnormality is seen. Status post cholecystectomy. No biliary dilatation. Pancreas: Unremarkable. No pancreatic ductal dilatation or surrounding inflammatory changes. Spleen: Normal in size without focal abnormality. Adrenals/Urinary Tract: No evidence of hydronephrosis. Bilateral perinephric stranding. Simple appearing 3.6 cm exophytic cyst off of the lower pole of the right kidney. No hydronephrosis or nephrolithiasis. Urinary bladder is normal. Stomach/Bowel: Stomach is within normal limits. Appendix appears normal. No evidence of bowel wall thickening, distention, or inflammatory changes. Vascular/Lymphatic: Aortic atherosclerosis. No enlarged abdominal or pelvic lymph nodes. Reproductive: Status post hysterectomy. No adnexal masses. Other: No abdominal wall hernia or abnormality. No abdominopelvic ascites. Musculoskeletal: Spondylosis of the visualized spine. Intact right hip arthroplasty. IMPRESSION: 1. No evidence of acute abnormalities within the abdomen or pelvis. 2. Bilateral perinephric stranding, nonspecific. Please correlate to urinalysis. 3. Aortic atherosclerosis. Aortic Atherosclerosis (ICD10-I70.0). Electronically Signed   By:  Fidela Salisbury M.D.   On: 06/03/2020 14:47   CT Head Wo Contrast  Result Date: 06/03/2020 CLINICAL DATA:  Ataxia.  History of prior infarct. EXAM: CT HEAD WITHOUT CONTRAST TECHNIQUE: Contiguous axial images were obtained from the base of the  skull through the vertex without intravenous contrast. COMPARISON:  Head CT 01/31/2020. FINDINGS: Brain: No evidence of acute infarction, hemorrhage, hydrocephalus, extra-axial collection or mass lesion/mass effect. There is atrophy in the left subinsular cortex consistent with remote infarct. Also seen is some chronic microvascular ischemic change and atrophy. Vascular: No hyperdense vessel or unexpected calcification. Skull: Normal. Negative for fracture or focal lesion. Sinuses/Orbits: Negative. Other: None. IMPRESSION: No acute abnormality. Remote left subinsular infarct. Electronically Signed   By: Inge Rise M.D.   On: 06/03/2020 16:16    EKG: Independently reviewed. Sinus  Assessment/Plan Active Problems:   Abdominal pain   AKI (acute kidney injury) ([place])  (please populate well all problems here in Problem List. (For example, if patient is on BP meds at home and you resume or decide to hold them, it is a problem that needs to be her. Same for CAD, COPD, HLD and so on)   Acute on chronic abdominal pain -No clear etiology, CT abdomen without contrast with limited results showing no acute abnormalities other than bilateral kidney stranding.  But her UA looks clean and no leukocytosis makes pyelonephritis and other urinary tract infection unlikely -Patient is vasculopathic, CT showed heavy calcification on abdominal aorta, clinically, there is a mismatch between her symptoms and physical exam, although she reported severe abdominal pain which severe enough to cause her stop eating, but her belly is soft to touch, tenderness localized on the right aspect, this mismatch somewhat raised concern about abdominal angina? ischemic bowel syndrome?Marland Kitchen   However currently patient cannot answer any questions regarding home much and what kind of eating habit changes associated with this abdominal pain or bloody diarrhea such kind, CTA not an option given her AKI status. -R/O abdominal aortic dissection, given blood pressure significantly elevated.  Discussed with ultrasound technician, who will perform a stat abdominal ultrasound and Doppler.  AKI with non-anion gap metabolic acidosis -Probably multifactorial from severe dehydration, hypertension emergency -Check VBG, Lactate -Start IV fluid and treat hypertension -Renal ultrasound  HTN emergency -IV hydralazine as needed, restart p.o. BP meds once patient tolerate p.o.  Recent PE and DVT -Change Eliquis to heparin drip for now, until pt re-establish p.o.  Sternum osteomyelitis -Continue vancomycin and cefepime, plan is to complete on 06/15/2020  Recent stroke with PFO and PE -CT head reassuring, continue systemic anticoagulation -PT evaluation  Failure to thrive -No clear etiology at this time other than chronic abdominal pain -Since pt seems tolerate Ensure, will consult Dietitian.  DVT prophylaxis: Heparin drip Code Status: Full code Family Communication: POA Joy (Friend) at bedside Disposition Plan: Patient has complicated medical issues with AKI intractable abdominal pain likely will need more than 2 midnight hospital stay Consults called: None Admission status: Tele admit   Lequita Halt MD [facility] Pager [phone]    06/03/2020, 5:46 PM

## 2020-06-03 NOTE — ED Triage Notes (Signed)
Pt here from [facility] via [facility] for abd pain x75mo, but has gotten worse over the last 3 days. Pt had CABG in may, wound dihiscence w/ MRSA infection. Pt has hx of hemorrhagic stroke, w/ deficit of aphasia. PICC line in L arm.

## 2020-06-04 ENCOUNTER — Inpatient Hospital Stay (HOSPITAL_COMMUNITY): Payer: PPO

## 2020-06-04 DIAGNOSIS — R1033 Periumbilical pain: Secondary | ICD-10-CM

## 2020-06-04 DIAGNOSIS — N179 Acute kidney failure, unspecified: Principal | ICD-10-CM

## 2020-06-04 DIAGNOSIS — G9341 Metabolic encephalopathy: Secondary | ICD-10-CM

## 2020-06-04 DIAGNOSIS — I10 Essential (primary) hypertension: Secondary | ICD-10-CM

## 2020-06-04 LAB — LACTIC ACID, PLASMA: Lactic Acid, Venous: 1.3 mmol/L (ref 0.5–1.9)

## 2020-06-04 LAB — COMPREHENSIVE METABOLIC PANEL
ALT: 18 U/L (ref 0–44)
AST: 18 U/L (ref 15–41)
Albumin: 2.5 g/dL — ABNORMAL LOW (ref 3.5–5.0)
Alkaline Phosphatase: 89 U/L (ref 38–126)
Anion gap: 11 (ref 5–15)
BUN: 46 mg/dL — ABNORMAL HIGH (ref 8–23)
CO2: 15 mmol/L — ABNORMAL LOW (ref 22–32)
Calcium: 11.6 mg/dL — ABNORMAL HIGH (ref 8.9–10.3)
Chloride: 118 mmol/L — ABNORMAL HIGH (ref 98–111)
Creatinine, Ser: 2.26 mg/dL — ABNORMAL HIGH (ref 0.44–1.00)
GFR calc Af Amer: 24 mL/min — ABNORMAL LOW (ref >60)
GFR calc non Af Amer: 20 mL/min — ABNORMAL LOW (ref >60)
Glucose, Bld: 88 mg/dL (ref 70–99)
Potassium: 3.8 mmol/L (ref 3.5–5.1)
Sodium: 144 mmol/L (ref 135–145)
Total Bilirubin: 1.1 mg/dL (ref 0.3–1.2)
Total Protein: 6.2 g/dL — ABNORMAL LOW (ref 6.5–8.1)

## 2020-06-04 LAB — URINE CULTURE: Culture: NO GROWTH

## 2020-06-04 LAB — CBC
HCT: 35.3 % — ABNORMAL LOW (ref 36.0–46.0)
Hemoglobin: 10.5 g/dL — ABNORMAL LOW (ref 12.0–15.0)
MCH: 24.7 pg — ABNORMAL LOW (ref 26.0–34.0)
MCHC: 29.7 g/dL — ABNORMAL LOW (ref 30.0–36.0)
MCV: 83.1 fL (ref 80.0–100.0)
Platelets: 260 10*3/uL (ref 150–400)
RBC: 4.25 MIL/uL (ref 3.87–5.11)
RDW: 16 % — ABNORMAL HIGH (ref 11.5–15.5)
WBC: 12.6 10*3/uL — ABNORMAL HIGH (ref 4.0–10.5)
nRBC: 0 % (ref 0.0–0.2)

## 2020-06-04 LAB — APTT
aPTT: 150 seconds — ABNORMAL HIGH (ref 24–36)
aPTT: 109 seconds — ABNORMAL HIGH (ref 24–36)

## 2020-06-04 LAB — VANCOMYCIN, RANDOM: Vancomycin Rm: 26

## 2020-06-04 LAB — TSH: TSH: 12.529 u[IU]/mL — ABNORMAL HIGH (ref 0.350–4.500)

## 2020-06-04 LAB — LACTATE DEHYDROGENASE: LDH: 83 U/L — ABNORMAL LOW (ref 98–192)

## 2020-06-04 LAB — HEPARIN LEVEL (UNFRACTIONATED)
Heparin Unfractionated: 1.1 IU/mL — ABNORMAL HIGH (ref 0.30–0.70)
Heparin Unfractionated: 0.64 IU/mL (ref 0.30–0.70)

## 2020-06-04 LAB — MRSA PCR SCREENING: MRSA by PCR: NEGATIVE

## 2020-06-04 MED ORDER — HEPARIN (PORCINE) 25000 UT/250ML-% IV SOLN
600.0000 [IU]/h | INTRAVENOUS | Status: DC
  Administered 2020-06-04: 700 [IU]/h via INTRAVENOUS
  Administered 2020-06-04: 750 [IU]/h via INTRAVENOUS
  Filled 2020-06-04: qty 250

## 2020-06-04 MED ORDER — SODIUM CHLORIDE 0.9 % IV BOLUS
2000.0000 mL | Freq: Once | INTRAVENOUS | Status: AC
  Administered 2020-06-04: 2000 mL via INTRAVENOUS

## 2020-06-04 MED ORDER — SODIUM CHLORIDE 0.9 % IV SOLN
1.0000 g | Freq: Two times a day (BID) | INTRAVENOUS | Status: DC
  Administered 2020-06-04 (×2): 1 g via INTRAVENOUS
  Filled 2020-06-04 (×4): qty 1

## 2020-06-04 MED ORDER — PANTOPRAZOLE SODIUM 40 MG IV SOLR
40.0000 mg | Freq: Two times a day (BID) | INTRAVENOUS | Status: DC
  Administered 2020-06-04 (×2): 40 mg via INTRAVENOUS
  Filled 2020-06-04 (×3): qty 40

## 2020-06-04 NOTE — Progress Notes (Addendum)
Pharmacy Antibiotic Note  [name] [name] is a 79 y.o. female admitted on 06/03/2020. Pharmacy has been consulted for meropenem dosing. Pt is afebrile but WBC is elevated at 12.6. She was on a course of cefepime and vancomycin for sternal osteomyelitis s/p CABG. Now changing cefepime to meropenem to broaden coverage. Pt with AKI with Scr at 2.26.   Plan: Meropenem 1gm IV q12H F/u renal fxn, C&S, clinical status F/u vancomycin level ordered for later this morning  Addendum: A random vancomycin level was checked and is above goal at 26 more than 48 hours after her last dose. Will continue to hold vancomycin and recheck a level in the morning.   Height: 5\' 3"  (160 cm) Weight: 56.9 kg (125 lb 7.1 oz) IBW/kg (Calculated) : 52.4  Temp (24hrs), Avg:97.7 F (36.5 C), Min:97.3 F (36.3 C), Max:97.9 F (36.6 C)  Recent Labs  Lab 06/03/20 1239 06/03/20 2100 06/04/20 0038 06/04/20 0433  WBC 10.1  --   --  12.6*  CREATININE 2.32*  --   --  2.26*  LATICACIDVEN  --  0.5 1.3  --     Estimated Creatinine Clearance: 17.5 mL/min (A) (by C-G formula based on SCr of 2.26 mg/dL (H)).    Allergies  Allergen Reactions  . Bee Venom Swelling    Massive swelling  . Penicillins Other (See Comments)    ** tolerates Zosyn + cephalosporins Did it involve swelling of the face/tongue/throat, SOB, or low BP? Unknown Did it involve sudden or severe rash/hives, skin peeling, or any reaction on the inside of your mouth or nose? Unknown Did you need to seek medical attention at a hospital or doctor's office? Unknown When did it last happen?1945 If all above answers are "NO", may proceed with cephalosporin use.  . Adhesive [Tape] Rash  . Erythromycin Nausea And Vomiting and Rash  . Morphine And Related Rash  . Sulfa Antibiotics Nausea And Vomiting and Rash    Antimicrobials this admission: Vanc PTA>> Cefepime PTA>>7/19 Meropenem 7/19>>  Dose adjustments this admission: N/A  Microbiology  results: Pending  Thank you for allowing pharmacy to be a part of this patient's care.  Deane Melick, Rande Lawman 06/04/2020 8:36 AM

## 2020-06-04 NOTE — Progress Notes (Signed)
Initial Nutrition Assessment  DOCUMENTATION CODES:   Non-severe (moderate) malnutrition in context of chronic illness  INTERVENTION:   -RD will follow for diet advancement and add supplements as appropriate -RD will make further recommendations based upon goals of care discussions  NUTRITION DIAGNOSIS:   Moderate Malnutrition related to chronic illness (CAD with CABG) as evidenced by percent weight loss, mild fat depletion, moderate fat depletion, mild muscle depletion, moderate muscle depletion.  GOAL:   Patient will meet greater than or equal to 90% of their needs  MONITOR:   Diet advancement, Labs, Weight trends, Skin, I & O's  REASON FOR ASSESSMENT:   Consult Assessment of nutrition requirement/status  ASSESSMENT:   [name] [name] is a 79 y.o. 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  Pt admitted with acute on chronic abdominal pain.   Reviewed I/O's: +1.2 L x 24 hours   UOP: 200 ml x 24 hours  Pt very somnolent at time of visit and did not respond to voice or touch. She is currently NPO. No family at bedside to provide additional history. Pt with multiple hospitalizations this year with decreased appetite.   Reviewed wt hx; pt has experienced a 5.6% wt loss over the past month, which is significant for time frame.   Medications reviewed and include 0.9% sodium chloride infusion @ 100 ml/hr and maxipime.   Labs reviewed.   NUTRITION - FOCUSED PHYSICAL EXAM:    Most Recent Value  Orbital Region Mild depletion  Upper Arm Region Moderate depletion  Thoracic and Lumbar Region Mild depletion  Buccal Region Mild depletion  Temple Region Mild depletion  Clavicle Bone Region Moderate depletion  Clavicle and Acromion Bone Region Mild depletion  Scapular Bone Region Mild depletion  Dorsal Hand Mild depletion  Patellar  Region Unable to assess  Anterior Thigh Region Unable to assess  Posterior Calf Region Unable to assess  Edema (RD Assessment) None  Hair Reviewed  Eyes Reviewed  Mouth Reviewed  Skin Reviewed  Nails Reviewed       Diet Order:   Diet Order            Diet NPO time specified  Diet effective now                 EDUCATION NEEDS:   No education needs have been identified at this time  Skin:  Skin Assessment: Skin Integrity Issues: Skin Integrity Issues:: Stage I, Other (Comment) Stage I: rt and lt buttocks Wound Vac: - Incisions: - Other: MASD to rt groin, sternal wound  Last BM:  Unknown  Height:   Ht Readings from Last 1 Encounters:  06/03/20 5\' 3"  (1.6 m)    Weight:   Wt Readings from Last 1 Encounters:  06/04/20 56.9 kg    Ideal Body Weight:  52.3 kg  BMI:  Body mass index is 22.22 kg/m.  Estimated Nutritional Needs:   Kcal:  1800-2000  Protein:  100-115 grams  Fluid:  > 1.8 L    Loistine Chance, RD, LDN, [place] Registered Dietitian II Certified Diabetes Care and Education Specialist Please refer to [facility] for RD and/or RD on-call/weekend/after hours pager

## 2020-06-04 NOTE — Progress Notes (Signed)
Patient arrives to hospital from [place] re[facility] at [facility]. She will need insurance approval to return and will have to see if bed is still available closer to discharge.  Nazaire Cordial LCSW

## 2020-06-04 NOTE — Evaluation (Signed)
Physical Therapy Evaluation Patient Details Name: [name] [name] MRN: [id number] DOB: 1941-07-30 Today's Date: 06/04/2020   History of Present Illness  79 y.o. female was brought to ED, demonstrating chest pain over last few days. s/p sternal I&D and flap closure 6/25. 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 ETT 3/12-3/13 (for procedure), 3/15-3/19. 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  Clinical Impression  Pt unable to verbalize response to any questions, inconsistently nod yes/no to questions. Pt limited in mobility by generalize pain worse in shoulders and knees with movement. Pt able to perform muscle activation in all extremities, however is severely limited. Pt attempt at rolling to R total A and not completed due to cry out in pain. PT recommending SNF level rehab at discharge. PT will follow back one more session to see if participation increases.    Follow Up Recommendations SNF;Supervision/Assistance - 24 hour    Equipment Recommendations  None recommended by PT       Precautions / Restrictions Precautions Precautions: Sternal Precaution Booklet Issued: No Precaution Comments: Wound vac, redo sternal I&D Restrictions Weight Bearing Restrictions: No Other Position/Activity Restrictions: sternal      Mobility  Bed Mobility Overal bed mobility: Needs Assistance Bed Mobility: Rolling Rolling: Total assist         General bed mobility comments: Attempt to roll to R side requiring total A, not able to complete due to increased retropulsion and groaning         Balance Overall balance assessment: Needs assistance                                           Pertinent Vitals/Pain Pain Assessment: Faces Faces Pain Scale: Hurts even more Pain Location: generalized with any movement with the exception of grip Pain Descriptors / Indicators:  Grimacing;Guarding;Discomfort;Moaning;Tender Pain Intervention(s): Limited activity within patient's tolerance;Monitored during session;Repositioned    Home Living Family/patient expects to be discharged to:: Skilled nursing facility                      Prior Function Level of Independence: Needs assistance         Comments: nods yes to OOB in last week         Extremity/Trunk Assessment   Upper Extremity Assessment Upper Extremity Assessment: RUE deficits/detail;Difficult to assess due to impaired cognition RUE Deficits / Details: decreased PROM due to pain, able to activate weak grip and R elbow extension    Lower Extremity Assessment Lower Extremity Assessment: RLE deficits/detail;LLE deficits/detail;Difficult to assess due to impaired cognition RLE Deficits / Details: tolerates R hip and R ankle plantar/dorsiflex, no knee ROM permitted met with grimace, no attempts/ability to perform activation  RLE: Unable to fully assess due to pain RLE Sensation: decreased light touch (nods yes to feeling) LLE Deficits / Details: tolerates L hip and L ankle plantar/dorsiflex, no knee ROM permitted met with grimace, able to activate dorsiflexion but no other attempts/ability to perform activation  LLE: Unable to fully assess due to pain LLE Sensation: decreased light touch (nods yes to feeling)       Communication   Communication: HOH;Expressive difficulties  Cognition Arousal/Alertness: Lethargic Behavior During Therapy: Flat affect Overall Cognitive Status: Difficult to assess  Problem Solving: Slow processing;Requires verbal cues;Difficulty sequencing;Decreased initiation General Comments: pt would not verbalize anything during session, only able to inconsistently answer yes/no questions with nodding      General Comments General comments (skin integrity, edema, etc.): VSS on RA        Assessment/Plan    PT Assessment  Patient needs continued PT services  PT Problem List Decreased strength;Decreased range of motion;Decreased activity tolerance;Decreased mobility;Decreased cognition;Pain       PT Treatment Interventions      PT Goals (Current goals can be found in the Care Plan section)  Acute Rehab PT Goals Patient Stated Goal: to eat PT Goal Formulation: Patient unable to participate in goal setting Time For Goal Achievement: 05/20/20 Potential to Achieve Goals: Good    Frequency Min 2X/week    AM-PAC PT "6 Clicks" Mobility  Outcome Measure Help needed turning from your back to your side while in a flat bed without using bedrails?: Total Help needed moving from lying on your back to sitting on the side of a flat bed without using bedrails?: Total Help needed moving to and from a bed to a chair (including a wheelchair)?: Total Help needed standing up from a chair using your arms (e.g., wheelchair or bedside chair)?: Total Help needed to walk in hospital room?: Total Help needed climbing 3-5 steps with a railing? : Total 6 Click Score: 6    End of Session   Activity Tolerance: Patient limited by pain;Patient limited by lethargy Patient left: in bed;with bed alarm set;with call bell/phone within reach Nurse Communication: Mobility status (due to pt immobility, pt in need of air bed and prevalon boo) PT Visit Diagnosis: Unsteadiness on feet (R26.81);Other abnormalities of gait and mobility (R26.89);Muscle weakness (generalized) (M62.81);Adult, failure to thrive (R62.7);Difficulty in walking, not elsewhere classified (R26.2);Other symptoms and signs involving the nervous system (R29.898);Pain Hemiplegia - Right/Left: Right Hemiplegia - dominant/non-dominant: Dominant 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 Migdalia Dk PT, DPT Acute Rehabilitation Services Pager [phone] Office [phone]   [place] 06/04/2020, 10:58 AM

## 2020-06-04 NOTE — Progress Notes (Signed)
[facility] Indication: pulmonary embolus  Allergies  Allergen Reactions  . Bee Venom Swelling    Massive swelling  . Penicillins Other (See Comments)    ** tolerates Zosyn + cephalosporins Did it involve swelling of the face/tongue/throat, SOB, or low BP? Unknown Did it involve sudden or severe rash/hives, skin peeling, or any reaction on the inside of your mouth or nose? Unknown Did you need to seek medical attention at a hospital or doctor's office? Unknown When did it last happen?1945 If all above answers are "NO", may proceed with cephalosporin use.  . Adhesive [Tape] Rash  . Erythromycin Nausea And Vomiting and Rash  . Morphine And Related Rash  . Sulfa Antibiotics Nausea And Vomiting and Rash    Patient Measurements: Height: 5\' 3"  (160 cm) Weight: 56.9 kg (125 lb 7.1 oz) IBW/kg (Calculated) : 52.4 Heparin Dosing Weight: 57.3 kg   Vital Signs: Temp: 97.8 F (36.6 C) (07/19 1119) Temp Source: Oral (07/19 1119) BP: 171/53 (07/19 1119) Pulse Rate: 67 (07/19 1119)  Labs: Recent Labs    06/03/20 1239 06/03/20 1239 06/03/20 1530 06/03/20 2111 06/04/20 0433 06/04/20 1530  HGB 10.0*   < >  --  8.5* 10.5*  --   HCT 33.0*  --   --  25.0* 35.3*  --   PLT 267  --   --   --  260  --   APTT  --   --   --   --  150* 109*  HEPARINUNFRC  --   --   --   --  1.10* 0.64  CREATININE 2.32*  --   --   --  2.26*  --   TROPONINIHS 10  --  11  --   --   --    < > = values in this interval not displayed.    Estimated Creatinine Clearance: 17.5 mL/min (A) (by C-G formula based on SCr of 2.26 mg/dL (H)).   Assessment: Patient is a 79 yr old female admitted for abdominal pain. She also had a DVT earlier this year and has been taking Xarelto at the nursing home - now being held (last dose of Xarelto given at 2100 on 05/31/20; poor renal function). Pharmacy was consulted to dose heparin.   aPTT and heparin level ~8 hrs after  holding heparin for 1 hr, then restarting infusion at 750 units/hr, were 109 sec and 0.64 units/ml, respectively. H/H 10.5/35.3, plt 250. Per RN, no issues with IV or bleeding observed.   Goal of Therapy:  aPTT 66-102 seconds; heparin level 0.3-0.7 units/ml Monitor platelets by anticoagulation protocol: Yes   Plan:  Reduce heparin infusion to 700 units/hr Check aPTT, heparin level in 8 hrs Monitor daily aPTT, heparin level, CBC Monitor for signs/symptoms of bleeding  Gillermina Hu, PharmD, BCPS, [facility] Clinical Pharmacist 06/04/2020 5:48 PM

## 2020-06-04 NOTE — Progress Notes (Signed)
Manufacturing engineer [facility]) Community Based Palliative Care        This patient is enrolled in our palliative care services in the community.  [facility] will continue to follow for any discharge planning needs and to coordinate continuation of palliative care.   If you have questions or need assistance, please call [phone] or contact the hospital Liaison listed on AMION.        Domenic Moras, BSN, RN [facility] Liaison   [phone] (24h on call)

## 2020-06-04 NOTE — Progress Notes (Signed)
[facility] PROGRESS NOTE    Progress Note  [name] [name]  LAG:[id number] DOB: Nov 26, 1940 DOA: 06/03/2020 PCP: [name], MD     Brief Narrative:   [name] [name] is an 79 y.o. 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  On examination by admitting physician she was lethargic and unable to carry on a conversation or answer questions.  CT scan of the abdomen without contrast showed bilateral kidney stranding, creatinine of 2.3 (back in March it was less than 1) bicarb 17 potassium 3.2 white blood cell count of 10 with a left shift cardiac biomarkers basically flat.  Assessment/Plan:   Acute abdominal pain: IV cefepime, despite this her white blood cell count is trending up. CT scan of the abdomen and pelvis 06/03/2020 showed bilateral perinephric stranding, 3.6 exophytic cystic mass in the right kidney arthrosclerosis. Bilateral stranding of kidneys. Lactic acid is 1.0, unlikely ischemic bowel checking LDH. Continue IV vancomycin will DC cefepime start her on meropenem for possible ESBL. UA only shows 0-5 white blood cells unlikely urine culture showed culprit. I have spoken to 2 healthcare power of attorney and I have explained the situation how she appears to be deteriorating and worsening. They have agreed to meet with palliative care on 06/05/2020. I have explained to her her poor prognosis and she seems to understand.  Acute kidney injury/non-anion gap metabolic acidosis: Started on aggressive IV fluid hydration, will give a bolus of normal saline. Continue IV fluid infusion recheck basic metabolic panel tomorrow morning. Continue strict I's and O's.  Acute metabolic encephalopathy: CT scan of the  head was unremarkable question due to infectious etiology. Check an MRI of the brain to rule out a stroke the patient cannot provide a history.  She is unable to communicate and is unable to move her right side.  Essential hypertension, benign Elevated blood pressure, she was placed n.p.o. due to lethargy, continue IV hydralazine as needed.  History of PE/DVT: Patient not able to take orals currently on IV heparin.  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 Once the patient is awake consider physical therapy.  RN Pressure Injury Documentation: Pressure Injury 05/04/20 Buttocks Right;Left Stage 1 -  Intact skin with non-blanchable redness of a localized area usually over a bony prominence. reddened area on left and right buttocks (Active)  05/04/20 1710  Location: Buttocks  Location Orientation: Right;Left  Staging: Stage 1 -  Intact skin with non-blanchable redness of a localized area usually over a bony prominence.  Wound Description (Comments): reddened area on left and right buttocks  Present on Admission: Yes    Estimated body mass index is 22.22 kg/m as calculated from the following:   Height as of this encounter: 5\' 3"  (1.6 m).   Weight as of this encounter: 56.9 kg.   DVT prophylaxis: Heparin Family Communication:Friend HPOA: [phone], [place]myra Free Status is: Inpatient  Remains inpatient appropriate because:Hemodynamically unstable   Dispo: The patient is from: Home              Anticipated d/c is to: SNF              Anticipated d/c date is: > 3  days              Patient currently is not medically stable to d/c.   Code Status:     Code Status Orders  (From admission, onward)         Start     Ordered   06/03/20 1744  Full code  Continuous        06/03/20 1745        Code Status History    Date Active Date Inactive Code Status Order ID  Comments User Context   05/04/2020 1802 05/15/2020 2156 Full Code [id number]  Miguel Aschoff Inpatient   01/27/2020 2010 02/21/2020 2112 Full Code [id number]  Malon Kindle Inpatient   01/26/2020 1647 01/27/2020 2010 Full Code [id number]  Belva Crome, MD Inpatient   Advance Care Planning Activity    Advance Directive Documentation     Most Recent Value  Type of Advance Directive Out of facility DNR (pink MOST or yellow form)  Pre-existing out of facility DNR order (yellow form or pink MOST form) Pink Most/Yellow Form available - Physician notified to receive inpatient order  "MOST" Form in Place? --        IV Access:    Peripheral IV   Procedures and diagnostic studies:   CT ABDOMEN PELVIS WO CONTRAST  Result Date: 06/03/2020 CLINICAL DATA:  Worsening abdominal pain. EXAM: CT ABDOMEN AND PELVIS WITHOUT CONTRAST TECHNIQUE: Multidetector CT imaging of the abdomen and pelvis was performed following the standard protocol without IV contrast. COMPARISON:  None. FINDINGS: Lower chest: Plate and screw fixation of the mediastinum. Abandoned epicardial wires noted. No pericardial effusion. Mildly enlarged heart. Hepatobiliary: No focal liver abnormality is seen. Status post cholecystectomy. No biliary dilatation. Pancreas: Unremarkable. No pancreatic ductal dilatation or surrounding inflammatory changes. Spleen: Normal in size without focal abnormality. Adrenals/Urinary Tract: No evidence of hydronephrosis. Bilateral perinephric stranding. Simple appearing 3.6 cm exophytic cyst off of the lower pole of the right kidney. No hydronephrosis or nephrolithiasis. Urinary bladder is normal. Stomach/Bowel: Stomach is within normal limits. Appendix appears normal. No evidence of bowel wall thickening, distention, or inflammatory changes. Vascular/Lymphatic: Aortic atherosclerosis. No enlarged abdominal or pelvic lymph nodes. Reproductive: Status post hysterectomy. No adnexal masses. Other: No  abdominal wall hernia or abnormality. No abdominopelvic ascites. Musculoskeletal: Spondylosis of the visualized spine. Intact right hip arthroplasty. IMPRESSION: 1. No evidence of acute abnormalities within the abdomen or pelvis. 2. Bilateral perinephric stranding, nonspecific. Please correlate to urinalysis. 3. Aortic atherosclerosis. Aortic Atherosclerosis (ICD10-I70.0). Electronically Signed   By: Fidela Salisbury M.D.   On: 06/03/2020 14:47   CT Head Wo Contrast  Result Date: 06/03/2020 CLINICAL DATA:  Ataxia.  History of prior infarct. EXAM: CT HEAD WITHOUT CONTRAST TECHNIQUE: Contiguous axial images were obtained from the base of the skull through the vertex without intravenous contrast. COMPARISON:  Head CT 01/31/2020. FINDINGS: Brain: No evidence of acute infarction, hemorrhage, hydrocephalus, extra-axial collection or mass lesion/mass effect. There is atrophy in the left subinsular cortex consistent with remote infarct. Also seen is some chronic microvascular ischemic change and atrophy. Vascular: No hyperdense vessel or unexpected calcification. Skull: Normal. Negative for fracture or focal lesion. Sinuses/Orbits: Negative. Other: None. IMPRESSION: No acute abnormality. Remote left subinsular infarct. Electronically Signed   By: Inge Rise M.D.   On: 06/03/2020 16:16   US Abdomen Complete  Result Date: 06/03/2020 CLINICAL DATA:  Acute kidney injury abdominal pain EXAM: ABDOMEN ULTRASOUND COMPLETE COMPARISON:  None. FINDINGS: Gallbladder: The patient  is status post cholecystectomy. Common bile duct: Diameter: 8 mm Liver: No focal lesion identified. Within normal limits in parenchymal echogenicity. Portal vein is patent on color Doppler imaging with normal direction of blood flow towards the liver. IVC: No abnormality visualized. Pancreas: Not well visualized. Spleen: Size and appearance within normal limits. Right Kidney: Length: 10.7 cm. Echogenicity within normal limits. There is an  anechoic cyst seen within the midpole of the right kidney measuring 3.4 x 2.9 x 2.7 cm. Mild amount of surrounding perinephric stranding/fluid. Left Kidney: Length: 10.6 cm. Echogenicity within normal limits. No mass or hydronephrosis visualized. Mild amount of surrounding perinephric stranding/fluid. Abdominal aorta: Scattered atherosclerosis is noted. No evidence of aortic dissection. Maximum diameter measures 1.6 cm. Other findings: Small amount of abdominal ascites. IMPRESSION: No definite evidence of aortic dissection within the visualized portions. Normal appearing liver Mild bilateral perinephric stranding/fluid. Small amount of abdominal ascites Electronically Signed   By: Prudencio Pair M.D.   On: 06/03/2020 19:47     Medical Consultants:    None.  Anti-Infectives:   IV Vanc and cefepime  Subjective:    [name] [name] nonverbal moaning and groaning  Objective:    Vitals:   06/04/20 0139 06/04/20 0445 06/04/20 0556 06/04/20 0732  BP:  (!) 193/70 (!) 172/47 (!) 139/44  Pulse: (!) 56 61 (!) 56 (!) 50  Resp: 12 14    Temp:  97.6 F (36.4 C)    TempSrc:  Oral    SpO2: 100% 99%    Weight:  56.9 kg    Height:       SpO2: 99 %   Intake/Output Summary (Last 24 hours) at 06/04/2020 0748 Last data filed at 06/04/2020 0448 Gross per 24 hour  Intake 1358.4 ml  Output 200 ml  Net 1158.4 ml   Filed Weights   06/03/20 2301 06/04/20 0445  Weight: 56.1 kg 56.9 kg    Exam: General exam: In no acute distress. Respiratory system: Good air movement and clear to auscultation. Cardiovascular system: S1 & S2 heard, RRR. No JVD. Gastrointestinal system: Positive bowel sounds soft grimacing on all 4 quadrants I suspect this pain but no rebound or guarding. Neurology: She is not moving her right side not able to follow commands only grimaces on physical exam especially the abdominal exam Extremities: No pedal edema. Skin: No rashes, lesions or ulcers    Data Reviewed:     Labs: Basic Metabolic Panel: Recent Labs  Lab 06/03/20 1239 06/03/20 1239 06/03/20 2111 06/04/20 0433  NA 141  --  146* 144  K 3.2*   < > 3.3* 3.8  CL 114*  --   --  118*  CO2 17*  --   --  15*  GLUCOSE 90  --   --  88  BUN 46*  --   --  46*  CREATININE 2.32*  --   --  2.26*  CALCIUM 11.8*  --   --  11.6*   < > = values in this interval not displayed.   GFR Estimated Creatinine Clearance: 17.5 mL/min (A) (by C-G formula based on SCr of 2.26 mg/dL (H)). Liver Function Tests: Recent Labs  Lab 06/03/20 1239 06/04/20 0433  AST 15 18  ALT 19 18  ALKPHOS 91 89  BILITOT 1.4* 1.1  PROT 6.3* 6.2*  ALBUMIN 2.5* 2.5*   Recent Labs  Lab 06/03/20 1239  LIPASE 54*   No results for input(s): AMMONIA in the last 168 hours. Coagulation profile No results  for input(s): INR, PROTIME in the last 168 hours. COVID-19 Labs  No results for input(s): DDIMER, FERRITIN, LDH, CRP in the last 72 hours.  Lab Results  Component Value Date   SARSCOV2NAA NEGATIVE 06/03/2020   [place] NEGATIVE 05/14/2020   [place] NEGATIVE 05/05/2020   [place] NEGATIVE 04/20/2020    CBC: Recent Labs  Lab 06/03/20 1239 06/03/20 2111 06/04/20 0433  WBC 10.1  --  12.6*  NEUTROABS 7.4  --   --   HGB 10.0* 8.5* 10.5*  HCT 33.0* 25.0* 35.3*  MCV 81.1  --  83.1  PLT 267  --  260   Cardiac Enzymes: No results for input(s): CKTOTAL, CKMB, CKMBINDEX, TROPONINI in the last 168 hours. BNP (last 3 results) No results for input(s): PROBNP in the last 8760 hours. CBG: No results for input(s): GLUCAP in the last 168 hours. D-Dimer: No results for input(s): DDIMER in the last 72 hours. Hgb A1c: No results for input(s): HGBA1C in the last 72 hours. Lipid Profile: No results for input(s): CHOL, HDL, LDLCALC, TRIG, CHOLHDL, LDLDIRECT in the last 72 hours. Thyroid function studies: Recent Labs    06/04/20 0038  TSH 12.529*   Anemia work up: No results for input(s): VITAMINB12, FOLATE,  FERRITIN, TIBC, IRON, RETICCTPCT in the last 72 hours. Sepsis Labs: Recent Labs  Lab 06/03/20 1239 06/03/20 2100 06/04/20 0038 06/04/20 0433  WBC 10.1  --   --  12.6*  LATICACIDVEN  --  0.5 1.3  --    Microbiology Recent Results (from the past 240 hour(s))  SARS Coronavirus 2 by RT PCR (hospital order, performed in [facility] hospital lab) Nasopharyngeal Nasopharyngeal Swab     Status: None   Collection Time: 06/03/20  4:51 PM   Specimen: Nasopharyngeal Swab  Result Value Ref Range Status   SARS Coronavirus 2 NEGATIVE NEGATIVE Final    Comment: (NOTE) SARS-CoV-2 target nucleic acids are NOT DETECTED.  The SARS-CoV-2 RNA is generally detectable in upper and lower respiratory specimens during the acute phase of infection. The lowest concentration of SARS-CoV-2 viral copies this assay can detect is 250 copies / mL. A negative result does not preclude SARS-CoV-2 infection and should not be used as the sole basis for treatment or other patient management decisions.  A negative result may occur with improper specimen collection / handling, submission of specimen other than nasopharyngeal swab, presence of viral mutation(s) within the areas targeted by this assay, and inadequate number of viral copies (<250 copies / mL). A negative result must be combined with clinical observations, patient history, and epidemiological information.  Fact Sheet for Patients:   StrictlyIdeas.no  Fact Sheet for Healthcare Providers: BankingDealers.co.za  This test is not yet approved or  cleared by the [place] FDA and has been authorized for detection and/or diagnosis of SARS-CoV-2 by FDA under an Emergency Use Authorization (EUA).  This EUA will remain in effect (meaning this test can be used) for the duration of the COVID-19 declaration under Section 564(b)(1) of the Act, 21 U.S.C. section 360bbb-3(b)(1), unless the authorization is terminated  or revoked sooner.  Performed at [facility], [place] [address], [place], [place] [address]      Medications:   . aspirin  300 mg Rectal Daily  . atorvastatin  80 mg Oral Daily  . Chlorhexidine Gluconate Cloth  6 each Topical Daily  . feeding supplement (ENSURE ENLIVE)  237 mL Oral QID  . [START ON 06/05/2020] levothyroxine  100 mcg Intravenous Once per day  on Sun Tue Thu Sat  . levothyroxine  50 mcg Intravenous Once per day on Mon Wed Fri  . sertraline  50 mg Oral QHS  . sodium chloride flush  10-40 mL Intracatheter Q12H   Continuous Infusions: . sodium chloride 125 mL/hr at 06/04/20 0735  . ceFEPime (MAXIPIME) IV Stopped (06/03/20 2242)  . heparin 750 Units/hr (06/04/20 0735)      LOS: 1 day   Charlynne Cousins  [facility]  06/04/2020, 7:48 AM

## 2020-06-04 NOTE — Significant Event (Signed)
HOSPITAL MEDICINE OVERNIGHT EVENT NOTE   Notified by nursing that patient has a DNR and MOST form in her chart despite Full code being ordered by the admitting provider on 7/18.  Additionally, admitting provider's H&P states patient is full code.  I went to try to ask the patient as to her code status but she is lethargic and minimally verbal.  I tried to call the patient's friend, Caryl Asp, who is documented to be POA however she did not answer.  I left a voicemail asking that she call back so that we may clarify this issue.  Patient will remain full code for now.  Sherryll Burger Wanna Gully

## 2020-06-04 NOTE — [place] FL2 (Signed)
[place] MEDICAID FL2 LEVEL OF CARE SCREENING TOOL     IDENTIFICATION  Patient Name: [name] [name] Birthdate: Aug 14, 1941 Sex: female Admission Date (Current Location): 06/03/2020  [facility] and [place] Number:  Herbalist and Address:  [facility] [place], [place] [address], [place], [place] [address]      Provider Number: [id number]  Attending Physician Name and Address:  Charlynne Cousins, MD  Relative Name and Phone Number:       Current Level of Care: Hospital Recommended Level of Care: [place] Prior Approval Number:    Date Approved/Denied:   PASRR Number: [id number]  Discharge Plan: SNF    Current Diagnoses: Patient Active Problem List   Diagnosis Date Noted  . Acute metabolic encephalopathy 62/69/4854  . Abdominal pain 06/03/2020  . AKI (acute kidney injury) ([place]) 06/03/2020  . MRSA infection 05/11/2020  . Pseudomonas infection 05/11/2020  . 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 Normocytic anemia 05/11/2020  . History of pulmonary embolus (PE) 05/11/2020  . Malnutrition of moderate degree 05/09/2020  . Pressure injury of skin 05/04/2020  . Mediastinitis 05/04/2020  . PFO (patent foramen ovale) 04/30/2020  . Sternal wound infection 04/20/2020  . Subglottic edema   . History of open heart surgery   . Pleural effusion on right   . Status post tracheostomy ([place])   . History of CVA (cerebrovascular accident)   . History of thyroid cancer   . Leukocytosis   . Acute blood loss anemia   . Acute hypoxemic respiratory failure ([place])   . Tracheostomy status ([place])   . 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 3-vessel CAD 01/26/2020  . Coronary artery disease of native artery of native heart with stable angina pectoris ([place])   . Precordial chest pain 06/15/2014  . Carotid stenosis 06/15/2014  . Essential hypertension, benign 06/15/2014    Orientation RESPIRATION BLADDER Height & Weight      (Disoriented)   Normal Incontinent, External catheter Weight: 125 lb 7.1 oz (56.9 kg) Height:  5\' 3"  (160 cm)  BEHAVIORAL SYMPTOMS/MOOD NEUROLOGICAL BOWEL NUTRITION STATUS      Continent Diet (Please see DC Summary)  AMBULATORY STATUS COMMUNICATION OF NEEDS Skin   Extensive Assist Verbally Wound Vac, PU Stage and Appropriate Care, Surgical wounds (Stage I PU on buttocks; Closed incision on chest; wound vac on sternum)                       Personal Care Assistance Level of Assistance  Bathing, Dressing, Feeding Bathing Assistance: Maximum assistance Feeding assistance: Limited assistance Dressing Assistance: Maximum assistance     Functional Limitations Info  Sight, Hearing, Speech Sight Info: Impaired Hearing Info: Impaired Speech Info: Adequate    SPECIAL CARE FACTORS FREQUENCY  PT (By licensed PT), OT (By licensed OT)     PT Frequency: 5x/week OT Frequency: 4x/week            Contractures Contractures Info: Not present    Additional Factors Info  Code Status, Allergies, Isolation Precautions, Psychotropic Code Status Info: DNR Allergies Info: Bee Venom, Penicillins, Adhesive Tape, Erythromycin, Morphine And Related, Sulfa Antibiotics Psychotropic Info: Zoloft   Isolation Precautions Info: MRSA     Current Medications (06/04/2020):  This is the current hospital active medication list Current Facility-Administered Medications  Medication Dose Route Frequency Provider Last Rate Last Admin  . 0.9 %  sodium chloride infusion   Intravenous Continuous Charlynne Cousins, MD 100  mL/hr at 06/04/20 1043 Rate Change at 06/04/20 1043  . aspirin suppository 300 mg  300 mg Rectal Daily Wynetta Fines T, MD   300 mg at 06/04/20 1106  . atorvastatin (LIPITOR) tablet 80 mg  80 mg Oral Daily Wynetta Fines T, MD      . Chlorhexidine Gluconate Cloth 2 % PADS 6 each  6 each Topical Daily Lequita Halt, MD   6 each at 06/04/20 1106  . feeding supplement (ENSURE ENLIVE) (ENSURE ENLIVE) liquid 237 mL   237 mL Oral QID Wynetta Fines T, MD      . heparin ADULT infusion 100 units/mL (25000 units/256mL sodium chloride 0.45%)  750 Units/hr Intravenous Continuous Franky Macho, RPH 7.5 mL/hr at 06/04/20 0735 750 Units/hr at 06/04/20 0735  . hydrALAZINE (APRESOLINE) injection 5 mg  5 mg Intravenous Q4H PRN Lequita Halt, MD   5 mg at 06/03/20 1848  . HYDROmorphone (DILAUDID) injection 0.5-1 mg  0.5-1 mg Intravenous Q2H PRN Wynetta Fines T, MD   0.5 mg at 06/04/20 0505  . [START ON 06/05/2020] levothyroxine (SYNTHROID, LEVOTHROID) injection 100 mcg  100 mcg Intravenous Once per day on Sun Tue Thu Sat Wynetta Fines T, MD      . levothyroxine (SYNTHROID, LEVOTHROID) injection 50 mcg  50 mcg Intravenous Once per day on Mon Wed Fri Lequita Halt, MD   50 mcg at 06/04/20 0522  . meropenem (MERREM) 1 g in sodium chloride 0.9 % 100 mL IVPB  1 g Intravenous Q12H Rumbarger, Rachel L, RPH 200 mL/hr at 06/04/20 1106 1 g at 06/04/20 1106  . ondansetron (ZOFRAN) injection 4 mg  4 mg Intravenous Q6H PRN Wynetta Fines T, MD   4 mg at 06/03/20 1849  . pantoprazole (PROTONIX) injection 40 mg  40 mg Intravenous Q12H Charlynne Cousins, MD   40 mg at 06/04/20 1047  . sertraline (ZOLOFT) tablet 50 mg  50 mg Oral QHS Wynetta Fines T, MD      . sodium chloride flush (NS) 0.9 % injection 10-40 mL  10-40 mL Intracatheter Q12H Zhang, Pearletha Forge T, MD      . sodium chloride flush (NS) 0.9 % injection 10-40 mL  10-40 mL Intracatheter PRN Lequita Halt, MD         Discharge Medications: Please see discharge summary for a list of discharge medications.  Relevant Imaging Results:  Relevant Lab Results:   Additional Information SSN [id number]  Benard Halsted, LCSW

## 2020-06-04 NOTE — Progress Notes (Signed)
[place] for Heparin Indication: pulmonary embolus  Allergies  Allergen Reactions  . Bee Venom Swelling    Massive swelling  . Penicillins Other (See Comments)    ** tolerates Zosyn + cephalosporins Did it involve swelling of the face/tongue/throat, SOB, or low BP? Unknown Did it involve sudden or severe rash/hives, skin peeling, or any reaction on the inside of your mouth or nose? Unknown Did you need to seek medical attention at a hospital or doctor's office? Unknown When did it last happen?1945 If all above answers are "NO", may proceed with cephalosporin use.  . Adhesive [Tape] Rash  . Erythromycin Nausea And Vomiting and Rash  . Morphine And Related Rash  . Sulfa Antibiotics Nausea And Vomiting and Rash    Patient Measurements: Height: 5\' 3"  (160 cm) Weight: 56.9 kg (125 lb 7.1 oz) IBW/kg (Calculated) : 52.4 Heparin Dosing Weight: 57.3 kg   Vital Signs: Temp: 97.6 F (36.4 C) (07/19 0445) Temp Source: Oral (07/19 0445) BP: 172/47 (07/19 0556) Pulse Rate: 56 (07/19 0556)  Labs: Recent Labs    06/03/20 1239 06/03/20 1239 06/03/20 1530 06/03/20 2111 06/04/20 0433  HGB 10.0*   < >  --  8.5* 10.5*  HCT 33.0*  --   --  25.0* 35.3*  PLT 267  --   --   --  260  APTT  --   --   --   --  150*  HEPARINUNFRC  --   --   --   --  1.10*  CREATININE 2.32*  --   --   --  2.26*  TROPONINIHS 10  --  11  --   --    < > = values in this interval not displayed.    Estimated Creatinine Clearance: 17.5 mL/min (A) (by C-G formula based on SCr of 2.26 mg/dL (H)).   Assessment: Patient is a 31 yof that is being admitted for abdominal pain. The patient also had a DVT earlier this year and has been taking Xarelto at the nursing home - now being held. Pharmacy has been asked to dose heparin. Last dose of Xarelto  was at 2100 on 7/15.  PTT 150 sec (supratherapeutic), heparin level 1.1 (likely correlating at this point). Some vaginal  bleeding noted this morning when they changed patient.  Goal of Therapy:  aPTT 66-102 seconds; heparin level 0.3-0.7 units/ml Monitor platelets by anticoagulation protocol: Yes   Plan:  Hold heparin x 1 hour Restart heparin at 750 units/hr F/u PTT and heparin level 8 hr post restart F/u further bleeding  Sherlon Handing, PharmD, BCPS Please see amion for complete clinical pharmacist phone list 06/04/2020 6:14 AM

## 2020-06-05 ENCOUNTER — Other Ambulatory Visit: Payer: Self-pay

## 2020-06-05 ENCOUNTER — Ambulatory Visit: Payer: PPO | Admitting: Cardiovascular Disease

## 2020-06-05 DIAGNOSIS — Z515 Encounter for palliative care: Secondary | ICD-10-CM

## 2020-06-05 DIAGNOSIS — R1030 Lower abdominal pain, unspecified: Secondary | ICD-10-CM

## 2020-06-05 DIAGNOSIS — Z7189 Other specified counseling: Secondary | ICD-10-CM

## 2020-06-05 DIAGNOSIS — D649 Anemia, unspecified: Secondary | ICD-10-CM

## 2020-06-05 DIAGNOSIS — E440 Moderate protein-calorie malnutrition: Secondary | ICD-10-CM

## 2020-06-05 DIAGNOSIS — M869 Osteomyelitis, unspecified: Secondary | ICD-10-CM

## 2020-06-05 DIAGNOSIS — R627 Adult failure to thrive: Secondary | ICD-10-CM

## 2020-06-05 LAB — COMPREHENSIVE METABOLIC PANEL
ALT: 13 U/L (ref 0–44)
AST: 14 U/L — ABNORMAL LOW (ref 15–41)
Albumin: 2.3 g/dL — ABNORMAL LOW (ref 3.5–5.0)
Alkaline Phosphatase: 84 U/L (ref 38–126)
Anion gap: 10 (ref 5–15)
BUN: 41 mg/dL — ABNORMAL HIGH (ref 8–23)
CO2: 16 mmol/L — ABNORMAL LOW (ref 22–32)
Calcium: 11.4 mg/dL — ABNORMAL HIGH (ref 8.9–10.3)
Chloride: 121 mmol/L — ABNORMAL HIGH (ref 98–111)
Creatinine, Ser: 2.34 mg/dL — ABNORMAL HIGH (ref 0.44–1.00)
GFR calc Af Amer: 23 mL/min — ABNORMAL LOW (ref >60)
GFR calc non Af Amer: 20 mL/min — ABNORMAL LOW (ref >60)
Glucose, Bld: 74 mg/dL (ref 70–99)
Potassium: 3 mmol/L — ABNORMAL LOW (ref 3.5–5.1)
Sodium: 147 mmol/L — ABNORMAL HIGH (ref 135–145)
Total Bilirubin: 1.3 mg/dL — ABNORMAL HIGH (ref 0.3–1.2)
Total Protein: 5.6 g/dL — ABNORMAL LOW (ref 6.5–8.1)

## 2020-06-05 LAB — HEPARIN LEVEL (UNFRACTIONATED)
Heparin Unfractionated: 1.78 IU/mL — ABNORMAL HIGH (ref 0.30–0.70)
Heparin Unfractionated: 0.86 IU/mL — ABNORMAL HIGH (ref 0.30–0.70)

## 2020-06-05 LAB — CBC
HCT: 31.3 % — ABNORMAL LOW (ref 36.0–46.0)
Hemoglobin: 9.4 g/dL — ABNORMAL LOW (ref 12.0–15.0)
MCH: 24.5 pg — ABNORMAL LOW (ref 26.0–34.0)
MCHC: 30 g/dL (ref 30.0–36.0)
MCV: 81.5 fL (ref 80.0–100.0)
Platelets: 258 10*3/uL (ref 150–400)
RBC: 3.84 MIL/uL — ABNORMAL LOW (ref 3.87–5.11)
RDW: 15.9 % — ABNORMAL HIGH (ref 11.5–15.5)
WBC: 11.4 10*3/uL — ABNORMAL HIGH (ref 4.0–10.5)
nRBC: 0 % (ref 0.0–0.2)

## 2020-06-05 LAB — APTT
aPTT: >200 seconds (ref 24–36)
aPTT: 133 seconds — ABNORMAL HIGH (ref 24–36)

## 2020-06-05 LAB — VANCOMYCIN, RANDOM: Vancomycin Rm: 25

## 2020-06-05 MED ORDER — HYDROMORPHONE HCL 1 MG/ML IJ SOLN: 0.2500 mg | Freq: Four times a day (QID) | INTRAMUSCULAR | Status: DC

## 2020-06-05 MED ORDER — HYDROMORPHONE BOLUS VIA INFUSION: 0.5000 mg | INTRAVENOUS | 0 refills | Status: AC | PRN

## 2020-06-05 MED ORDER — LORAZEPAM 2 MG/ML IJ SOLN: 0.2500 mg | Freq: Two times a day (BID) | INTRAMUSCULAR | 0 refills | Status: AC

## 2020-06-05 MED ORDER — HALOPERIDOL LACTATE 2 MG/ML PO CONC
0.5000 mg | ORAL | Status: DC | PRN
  Filled 2020-06-05: qty 0.3

## 2020-06-05 MED ORDER — HYDROMORPHONE HCL 1 MG/ML IJ SOLN
0.2500 mg | Freq: Once | INTRAMUSCULAR | Status: AC
  Administered 2020-06-05: 0.25 mg via INTRAVENOUS
  Filled 2020-06-05: qty 0.5

## 2020-06-05 MED ORDER — HYDROMORPHONE BOLUS VIA INFUSION
0.5000 mg | INTRAVENOUS | Status: DC | PRN
  Filled 2020-06-05: qty 1

## 2020-06-05 MED ORDER — SODIUM CHLORIDE 0.9% FLUSH: 3.0000 mL | Freq: Two times a day (BID) | INTRAVENOUS | Status: AC

## 2020-06-05 MED ORDER — SODIUM CHLORIDE 0.9 % IV SOLN
0.2500 mg/h | INTRAVENOUS | Status: DC
  Administered 2020-06-05: 0.5 mg/h via INTRAVENOUS
  Filled 2020-06-05: qty 2.5

## 2020-06-05 MED ORDER — GLYCOPYRROLATE 0.2 MG/ML IJ SOLN: 0.2000 mg | INTRAMUSCULAR | Status: DC | PRN

## 2020-06-05 MED ORDER — HALOPERIDOL 0.5 MG PO TABS
0.5000 mg | ORAL_TABLET | ORAL | Status: DC | PRN
  Filled 2020-06-05: qty 1

## 2020-06-05 MED ORDER — DIPHENHYDRAMINE HCL 50 MG/ML IJ SOLN: 12.5000 mg | Freq: Four times a day (QID) | INTRAMUSCULAR | Status: DC | PRN

## 2020-06-05 MED ORDER — HALOPERIDOL LACTATE 5 MG/ML IJ SOLN: 0.5000 mg | INTRAMUSCULAR | Status: AC | PRN

## 2020-06-05 MED ORDER — GLYCOPYRROLATE 1 MG PO TABS: 1.0000 mg | ORAL_TABLET | ORAL | Status: AC | PRN

## 2020-06-05 MED ORDER — POLYVINYL ALCOHOL 1.4 % OP SOLN: 1.0000 [drp] | Freq: Four times a day (QID) | OPHTHALMIC | 0 refills | Status: AC | PRN

## 2020-06-05 MED ORDER — VANCOMYCIN VARIABLE DOSE PER UNSTABLE RENAL FUNCTION (PHARMACIST DOSING): Status: DC

## 2020-06-05 MED ORDER — MORPHINE SULFATE (PF) 2 MG/ML IV SOLN: 2.0000 mg | Freq: Once | INTRAVENOUS | Status: DC

## 2020-06-05 MED ORDER — SODIUM CHLORIDE 0.9% FLUSH: 3.0000 mL | INTRAVENOUS | Status: DC | PRN

## 2020-06-05 MED ORDER — FENTANYL 2500MCG IN NS 250ML (10MCG/ML) PREMIX INFUSION: 0.0000 ug/h | INTRAVENOUS | Status: DC

## 2020-06-05 MED ORDER — ONDANSETRON 4 MG PO TBDP
4.0000 mg | ORAL_TABLET | Freq: Four times a day (QID) | ORAL | Status: DC | PRN
  Filled 2020-06-05: qty 1

## 2020-06-05 MED ORDER — GLYCOPYRROLATE 0.2 MG/ML IJ SOLN: 0.2000 mg | INTRAMUSCULAR | Status: AC | PRN

## 2020-06-05 MED ORDER — SODIUM CHLORIDE 0.9 % IV SOLN: 0.5000 mg/h | INTRAVENOUS | Status: AC

## 2020-06-05 MED ORDER — DEXTROSE 5 % IV SOLN: INTRAVENOUS | Status: DC

## 2020-06-05 MED ORDER — MORPHINE 100MG IN NS 100ML (1MG/ML) PREMIX INFUSION: 1.0000 mg/h | INTRAVENOUS | Status: DC

## 2020-06-05 MED ORDER — BIOTENE DRY MOUTH MT LIQD: 15.0000 mL | OROMUCOSAL | Status: DC | PRN

## 2020-06-05 MED ORDER — HALOPERIDOL LACTATE 5 MG/ML IJ SOLN: 0.5000 mg | INTRAMUSCULAR | Status: DC | PRN

## 2020-06-05 MED ORDER — FENTANYL BOLUS VIA INFUSION
10.0000 ug | INTRAVENOUS | Status: DC | PRN
  Filled 2020-06-05: qty 10

## 2020-06-05 MED ORDER — ONDANSETRON HCL 4 MG/2ML IJ SOLN: 4.0000 mg | Freq: Four times a day (QID) | INTRAMUSCULAR | 0 refills | Status: AC | PRN

## 2020-06-05 MED ORDER — SODIUM CHLORIDE 0.9 % IV SOLN: 250.0000 mL | INTRAVENOUS | Status: DC | PRN

## 2020-06-05 MED ORDER — POTASSIUM CL IN DEXTROSE 5% 20 MEQ/L IV SOLN
20.0000 meq | INTRAVENOUS | Status: DC
  Filled 2020-06-05: qty 1000

## 2020-06-05 MED ORDER — LORAZEPAM 2 MG/ML IJ SOLN
0.2500 mg | Freq: Two times a day (BID) | INTRAMUSCULAR | Status: DC
  Administered 2020-06-05: 0.25 mg via INTRAVENOUS
  Filled 2020-06-05: qty 1

## 2020-06-05 MED ORDER — GLYCOPYRROLATE 1 MG PO TABS
1.0000 mg | ORAL_TABLET | ORAL | Status: DC | PRN
  Filled 2020-06-05: qty 1

## 2020-06-05 MED ORDER — ONDANSETRON HCL 4 MG/2ML IJ SOLN: 4.0000 mg | Freq: Four times a day (QID) | INTRAMUSCULAR | Status: DC | PRN

## 2020-06-05 MED ORDER — DIPHENHYDRAMINE HCL 50 MG/ML IJ SOLN: 12.5000 mg | Freq: Four times a day (QID) | INTRAMUSCULAR | 0 refills | Status: AC | PRN

## 2020-06-05 MED ORDER — SODIUM CHLORIDE 0.9% FLUSH
3.0000 mL | Freq: Two times a day (BID) | INTRAVENOUS | Status: DC
  Administered 2020-06-05: 3 mL via INTRAVENOUS

## 2020-06-05 MED ORDER — BIOTENE DRY MOUTH MT LIQD: 15.0000 mL | OROMUCOSAL | Status: AC | PRN

## 2020-06-05 MED ORDER — LORAZEPAM 2 MG/ML IJ SOLN: 1.0000 mg | INTRAMUSCULAR | Status: DC | PRN

## 2020-06-05 MED ORDER — POLYVINYL ALCOHOL 1.4 % OP SOLN
1.0000 [drp] | Freq: Four times a day (QID) | OPHTHALMIC | Status: DC | PRN
  Filled 2020-06-05: qty 15

## 2020-06-05 NOTE — Discharge Summary (Signed)
Physician Discharge Summary  [name] [name] NAT:[id number] DOB: 1941-09-18 DOA: 06/03/2020  PCP: [name], MD  Admit date: 06/03/2020 Discharge date: 06/05/2020  Admitted From: Home Disposition: Home  Recommendations for Outpatient Follow-up:  1. None  Home Health: NO Equipment/Devices: No  Discharge Condition: Hospice CODE STATUS: DNR Diet recommendation:Comfort feeds  Brief/Interim Summary: 79 y.o. 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  On examination by admitting physician she was lethargic and unable to carry on a conversation or answer questions.  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  She is developing edema in her extremities. MRI of the brain is negative for stroke. They have agreed to meet with palliative care on 06/05/2020. They would like all labs and medications that are not indicated for comfort stopped. We will select to move  her to a residential hospice facility. We will start her on IV fentanyl for comfort, Ativan for agitation. We will leave PICC in place.  Acute kidney injury/non-anion gap metabolic acidosis: Despite aggressive IV fluid resuscitation her renal function continues to deteriorate. She is now hypernatremic and hyperchloremic will change her fluids to D5W. We will have to speak to the family to try to move towards comfort care.  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 Unknown Did it involve sudden or severe rash/hives, skin peeling, or any reaction on the inside of your mouth or nose? Unknown Did you need to seek medical attention at a hospital or doctor's office? Unknown When did it last happen?1945 If all above answers are "NO", may proceed with cephalosporin use.   Adhesive [tape] Rash   Erythromycin Nausea And Vomiting, Rash   Morphine And Related Rash   Sulfa Antibiotics Nausea And Vomiting, Rash      Medication List    STOP taking these medications   acetaminophen 325  MG tablet Commonly known as: TYLENOL   aspirin EC 81 MG tablet   atorvastatin 80 MG tablet Commonly known as: LIPITOR   ceFEPime  IVPB Commonly known as: MAXIPIME   cholecalciferol 1000 units tablet Commonly known as: VITAMIN D   feeding supplement (ENSURE ENLIVE) Liqd   ferrous sulfate 325 (65 FE) MG EC tablet   levothyroxine 100 MCG tablet Commonly known  as: SYNTHROID   levothyroxine 200 MCG tablet Commonly known as: SYNTHROID   lisinopril 20 MG tablet Commonly known as: ZESTRIL   metoprolol tartrate 25 MG tablet Commonly known as: LOPRESSOR   OVER THE COUNTER MEDICATION   prenatal multivitamin Tabs tablet   rivaroxaban 20 MG Tabs tablet Commonly known as: XARELTO   sertraline 50 MG tablet Commonly known as: ZOLOFT   traMADol 50 MG tablet Commonly known as: ULTRAM   vancomycin  IVPB   vitamin B-12 1000 MCG tablet Commonly known as: CYANOCOBALAMIN     TAKE these medications   antiseptic oral rinse Liqd Apply 15 mLs topically as needed for dry mouth.   diphenhydrAMINE 50 MG/ML injection Commonly known as: BENADRYL Inject 0.25 mLs (12.5 mg total) into the vein every 6 (six) hours as needed for itching or allergies (Opioid induced rash).   glycopyrrolate 1 MG tablet Commonly known as: ROBINUL Take 1 tablet (1 mg total) by mouth every 4 (four) hours as needed (excessive secretions).   glycopyrrolate 0.2 MG/ML injection Commonly known as: ROBINUL Inject 1 mL (0.2 mg total) into the skin every 4 (four) hours as needed (excessive secretions).   haloperidol lactate 5 MG/ML injection Commonly known as: HALDOL Inject 0.1 mLs (0.5 mg total) into the vein every 4 (four) hours as needed (or delirium).   HYDROmorphone 2 mg/mL Soln Commonly known as: DILAUDID Inject 0.5 mg into the vein every 30 (thirty) minutes as needed (discomfort or agitation).   HYDROmorphone 25 mg in sodium chloride 0.9 % 47.5 mL Inject 0.5 mg/hr into the vein continuous.   LORazepam 2 MG/ML injection Commonly known as: ATIVAN Inject 0.13 mLs (0.26 mg total) into the vein 2 (two) times daily.   ondansetron 4 MG/2ML Soln injection Commonly known as: ZOFRAN Inject 2 mLs (4 mg total) into the vein every 6 (six) hours as needed for nausea.   polyvinyl alcohol 1.4 % ophthalmic solution Commonly known as: LIQUIFILM TEARS Place 1 drop into both eyes  4 (four) times daily as needed for dry eyes.   sodium chloride flush 0.9 % Soln Commonly known as: NS Inject 3 mLs into the vein every 12 (twelve) hours.       Allergies  Allergen Reactions  . Bee Venom Swelling    Massive swelling  . Penicillins Other (See Comments)    ** tolerates Zosyn + cephalosporins Did it involve swelling of the face/tongue/throat, SOB, or low BP? Unknown Did it involve sudden or severe rash/hives, skin peeling, or any reaction on the inside of your mouth or nose? Unknown Did you need to seek medical attention at a hospital or doctor's office? Unknown When did it last happen?1945 If all above answers are "NO", may proceed with cephalosporin use.  . Adhesive [Tape] Rash  . Erythromycin Nausea And Vomiting and Rash  . Morphine And Related Rash  . Sulfa Antibiotics Nausea And Vomiting and Rash    Consultations:  PMT   Procedures/Studies: CT ABDOMEN PELVIS WO CONTRAST  Result Date: 06/03/2020 CLINICAL DATA:  Worsening abdominal pain. EXAM: CT ABDOMEN AND PELVIS WITHOUT CONTRAST TECHNIQUE: Multidetector  CT imaging of the abdomen and pelvis was performed following the standard protocol without IV contrast. COMPARISON:  None. FINDINGS: Lower chest: Plate and screw fixation of the mediastinum. Abandoned epicardial wires noted. No pericardial effusion. Mildly enlarged heart. Hepatobiliary: No focal liver abnormality is seen. Status post cholecystectomy. No biliary dilatation. Pancreas: Unremarkable. No pancreatic ductal dilatation or surrounding inflammatory changes. Spleen: Normal in size without focal abnormality. Adrenals/Urinary Tract: No evidence of hydronephrosis. Bilateral perinephric stranding. Simple appearing 3.6 cm exophytic cyst off of the lower pole of the right kidney. No hydronephrosis or nephrolithiasis. Urinary bladder is normal. Stomach/Bowel: Stomach is within normal limits. Appendix appears normal. No evidence of bowel wall thickening,  distention, or inflammatory changes. Vascular/Lymphatic: Aortic atherosclerosis. No enlarged abdominal or pelvic lymph nodes. Reproductive: Status post hysterectomy. No adnexal masses. Other: No abdominal wall hernia or abnormality. No abdominopelvic ascites. Musculoskeletal: Spondylosis of the visualized spine. Intact right hip arthroplasty. IMPRESSION: 1. No evidence of acute abnormalities within the abdomen or pelvis. 2. Bilateral perinephric stranding, nonspecific. Please correlate to urinalysis. 3. Aortic atherosclerosis. Aortic Atherosclerosis (ICD10-I70.0). Electronically Signed   By: Fidela Salisbury M.D.   On: 06/03/2020 14:47   DG Chest 1 View  Result Date: 05/11/2020 CLINICAL DATA:  Status post open cardiac surgery EXAM: CHEST  1 VIEW COMPARISON:  05/05/2020 chest radiograph. FINDINGS: Left PICC terminates over the cavoatrial junction. Stable cardiomediastinal silhouette with mild cardiomegaly. No pneumothorax. No pleural effusion. Cephalization of the pulmonary vasculature without overt pulmonary edema. No acute consolidative airspace disease. IMPRESSION: Stable mild cardiomegaly without overt pulmonary edema. No active pulmonary disease. Electronically Signed   By: Ilona Sorrel M.D.   On: 05/11/2020 08:54   DG Chest 2 View  Result Date: 05/13/2020 CLINICAL DATA:  Sternal wound infection post open heart surgery, history hypertension, stroke, coronary artery disease, thyroid carcinoma EXAM: CHEST - 2 VIEW COMPARISON:  05/11/2020 FINDINGS: LEFT arm PICC line with tip projecting over SVC. Epicardial pacing wires noted. Normal heart size post median sternotomy. Mediastinal contours and pulmonary vascularity normal. Minimal LEFT basilar atelectasis. Lungs otherwise clear. No acute infiltrate, pleural effusion, or pneumothorax. Bones demineralized with BILATERAL glenohumeral degenerative changes noted. IMPRESSION: Minimal LEFT base atelectasis. Electronically Signed   By: Lavonia Dana M.D.   On:  05/13/2020 09:42   DG Abd 1 View  Result Date: 05/28/2020 CLINICAL DATA:  Rule out obstruction. EXAM: ABDOMEN - 1 VIEW COMPARISON:  Abdominal x-ray dated January 28, 2020. FINDINGS: The bowel gas pattern is normal. No pneumoperitoneum. Increased colonic stool burden. No radio-opaque calculi or other significant radiographic abnormality are seen. No acute osseous abnormality. IMPRESSION: 1. No obstruction. 2. Increased colonic stool burden.  Correlate for constipation. Electronically Signed   By: Titus Dubin M.D.   On: 05/28/2020 13:38   CT Head Wo Contrast  Result Date: 06/03/2020 CLINICAL DATA:  Ataxia.  History of prior infarct. EXAM: CT HEAD WITHOUT CONTRAST TECHNIQUE: Contiguous axial images were obtained from the base of the skull through the vertex without intravenous contrast. COMPARISON:  Head CT 01/31/2020. FINDINGS: Brain: No evidence of acute infarction, hemorrhage, hydrocephalus, extra-axial collection or mass lesion/mass effect. There is atrophy in the left subinsular cortex consistent with remote infarct. Also seen is some chronic microvascular ischemic change and atrophy. Vascular: No hyperdense vessel or unexpected calcification. Skull: Normal. Negative for fracture or focal lesion. Sinuses/Orbits: Negative. Other: None. IMPRESSION: No acute abnormality. Remote left subinsular infarct. Electronically Signed   By: Inge Rise M.D.   On: 06/03/2020 16:16  MR BRAIN WO CONTRAST  Result Date: 06/04/2020 CLINICAL DATA:  Encephalopathy EXAM: MRI HEAD WITHOUT CONTRAST TECHNIQUE: Multiplanar, multiecho pulse sequences of the brain and surrounding structures were obtained without intravenous contrast. COMPARISON:  2019 FINDINGS: Brain: There is no acute infarction or intracranial hemorrhage. Interval chronic hemorrhagic infarction involving the left lentiform nucleus and caudate body with ex vacuo dilatation of the adjacent left lateral ventricle. Superimposed prominence of the ventricles  and sulci reflects minor generalized parenchymal volume loss. Additional patchy T2 hyperintensity in the supratentorial white matter is nonspecific but may reflect mild chronic microvascular ischemic changes. 8 x 4 mm dural-based lesion is present along the anterior right tentorial incisura (series 6, image 8) and has slightly increased in size since the prior study. There is no mass effect or edema. There is no hydrocephalus or extra-axial fluid collection. Vascular: Major vessel flow voids at the skull base are preserved. Skull and upper cervical spine: Normal marrow signal is preserved. Sinuses/Orbits: Minor mucosal thickening.  Orbits are unremarkable. Other: Sella is unremarkable. Minor patchy mastoid fluid opacification. IMPRESSION: No acute infarction or hemorrhage. Slight increase in size of still subcentimeter meningioma along the anterior right tentorial insertion. Interval chronic hemorrhagic infarct of the left basal ganglia. Mild chronic microvascular ischemic changes. Electronically Signed   By: Macy Mis M.D.   On: 06/04/2020 10:08   US Abdomen Complete  Result Date: 06/03/2020 CLINICAL DATA:  Acute kidney injury abdominal pain EXAM: ABDOMEN ULTRASOUND COMPLETE COMPARISON:  None. FINDINGS: Gallbladder: The patient is status post cholecystectomy. Common bile duct: Diameter: 8 mm Liver: No focal lesion identified. Within normal limits in parenchymal echogenicity. Portal vein is patent on color Doppler imaging with normal direction of blood flow towards the liver. IVC: No abnormality visualized. Pancreas: Not well visualized. Spleen: Size and appearance within normal limits. Right Kidney: Length: 10.7 cm. Echogenicity within normal limits. There is an anechoic cyst seen within the midpole of the right kidney measuring 3.4 x 2.9 x 2.7 cm. Mild amount of surrounding perinephric stranding/fluid. Left Kidney: Length: 10.6 cm. Echogenicity within normal limits. No mass or hydronephrosis visualized.  Mild amount of surrounding perinephric stranding/fluid. Abdominal aorta: Scattered atherosclerosis is noted. No evidence of aortic dissection. Maximum diameter measures 1.6 cm. Other findings: Small amount of abdominal ascites. IMPRESSION: No definite evidence of aortic dissection within the visualized portions. Normal appearing liver Mild bilateral perinephric stranding/fluid. Small amount of abdominal ascites Electronically Signed   By: Prudencio Pair M.D.   On: 06/03/2020 19:47   [place] EKG SITE RITE  Result Date: 05/07/2020 If Site Rite image not attached, placement could not be confirmed due to current cardiac rhythm.   (Echo, Carotid, EGD, Colonoscopy, ERCP)    Subjective: None verbal  Discharge Exam: Vitals:   06/05/20 0434 06/05/20 1149  BP: (!) 158/100 (!) 174/84  Pulse: 68 71  Resp: 19 16  Temp: 97.9 F (36.6 C) 97.9 F (36.6 C)  SpO2: 100% 99%   Vitals:   06/04/20 1923 06/05/20 0252 06/05/20 0434 06/05/20 1149  BP: (!) 167/92 (!) 188/74 (!) 158/100 (!) 174/84  Pulse: 70 70 68 71  Resp: 19 19 19 16   Temp: 98.3 F (36.8 C) 97.7 F (36.5 C) 97.9 F (36.6 C) 97.9 F (36.6 C)  TempSrc: Oral Oral Oral Oral  SpO2: 98% 98% 100% 99%  Weight:  61.7 kg    Height:        General: Pt is alert, awake, not in acute distress Cardiovascular: RRR, S1/S2 +, no rubs,  no gallops Respiratory: CTA bilaterally, no wheezing, no rhonchi Abdominal: Soft, NT, ND, bowel sounds + Extremities: no edema, no cyanosis    The results of significant diagnostics from this hospitalization (including imaging, microbiology, ancillary and laboratory) are listed below for reference.     Microbiology: Recent Results (from the past 240 hour(s))  Urine culture     Status: None   Collection Time: 06/03/20  4:25 PM   Specimen: Urine, Random  Result Value Ref Range Status   Specimen Description URINE, RANDOM  Final   Special Requests NONE  Final   Culture   Final    NO GROWTH Performed at [facility], [address], [place], [place] [address]    Report Status 06/04/2020 FINAL  Final  SARS Coronavirus 2 by RT PCR (hospital order, performed in [facility] hospital lab) Nasopharyngeal Nasopharyngeal Swab     Status: None   Collection Time: 06/03/20  4:51 PM   Specimen: Nasopharyngeal Swab  Result Value Ref Range Status   SARS Coronavirus 2 NEGATIVE NEGATIVE Final    Comment: (NOTE) SARS-CoV-2 target nucleic acids are NOT DETECTED.  The SARS-CoV-2 RNA is generally detectable in upper and lower respiratory specimens during the acute phase of infection. The lowest concentration of SARS-CoV-2 viral copies this assay can detect is 250 copies / mL. A negative result does not preclude SARS-CoV-2 infection and should not be used as the sole basis for treatment or other patient management decisions.  A negative result may occur with improper specimen collection / handling, submission of specimen other than nasopharyngeal swab, presence of viral mutation(s) within the areas targeted by this assay, and inadequate number of viral copies (<250 copies / mL). A negative result must be combined with clinical observations, patient history, and epidemiological information.  Fact Sheet for Patients:   StrictlyIdeas.no  Fact Sheet for Healthcare Providers: BankingDealers.co.za  This test is not yet approved or  cleared by the [place] FDA and has been authorized for detection and/or diagnosis of SARS-CoV-2 by FDA under an Emergency Use Authorization (EUA).  This EUA will remain in effect (meaning this test can be used) for the duration of the COVID-19 declaration under Section 564(b)(1) of the Act, 21 U.S.C. section 360bbb-3(b)(1), unless the authorization is terminated or revoked sooner.  Performed at [facility], [place] [address], [place], [place] [address]   MRSA PCR Screening     Status: None   Collection Time: 06/04/20   9:26 AM   Specimen: Nasopharyngeal  Result Value Ref Range Status   MRSA by PCR NEGATIVE NEGATIVE Final    Comment:        The GeneXpert MRSA Assay (FDA approved for NASAL specimens only), is one component of a comprehensive MRSA colonization surveillance program. It is not intended to diagnose MRSA infection nor to guide or monitor treatment for MRSA infections. Performed at [facility], [place] [address], [place],  [address]      Labs: BNP (last 3 results) No results for input(s): BNP in the last 8760 hours. Basic Metabolic Panel: Recent Labs  Lab 06/03/20 1239 06/03/20 2111 06/04/20 0433 06/05/20 0450  NA 141 146* 144 147*  K 3.2* 3.3* 3.8 3.0*  CL 114*  --  118* 121*  CO2 17*  --  15* 16*  GLUCOSE 90  --  88 74  BUN 46*  --  46* 41*  CREATININE 2.32*  --  2.26* 2.34*  CALCIUM 11.8*  --  11.6* 11.4*   Liver Function Tests: Recent Labs  Lab 06/03/20 1239 06/04/20 0433 06/05/20 0450  AST 15 18 14*  ALT 19 18 13   ALKPHOS 91 89 84  BILITOT 1.4* 1.1 1.3*  PROT 6.3* 6.2* 5.6*  ALBUMIN 2.5* 2.5* 2.3*   Recent Labs  Lab 06/03/20 1239  LIPASE 54*   No results for input(s): AMMONIA in the last 168 hours. CBC: Recent Labs  Lab 06/03/20 1239 06/03/20 2111 06/04/20 0433 06/05/20 0450  WBC 10.1  --  12.6* 11.4*  NEUTROABS 7.4  --   --   --   HGB 10.0* 8.5* 10.5* 9.4*  HCT 33.0* 25.0* 35.3* 31.3*  MCV 81.1  --  83.1 81.5  PLT 267  --  260 258   Cardiac Enzymes: No results for input(s): CKTOTAL, CKMB, CKMBINDEX, TROPONINI in the last 168 hours. BNP: Invalid input(s): POCBNP CBG: No results for input(s): GLUCAP in the last 168 hours. D-Dimer No results for input(s): DDIMER in the last 72 hours. Hgb A1c No results for input(s): HGBA1C in the last 72 hours. Lipid Profile No results for input(s): CHOL, HDL, LDLCALC, TRIG, CHOLHDL, LDLDIRECT in the last 72 hours. Thyroid function studies Recent Labs    06/04/20 0038  TSH 12.529*    Anemia work up No results for input(s): VITAMINB12, FOLATE, FERRITIN, TIBC, IRON, RETICCTPCT in the last 72 hours. Urinalysis    Component Value Date/Time   COLORURINE STRAW (A) 06/03/2020 1625   APPEARANCEUR CLEAR 06/03/2020 1625   LABSPEC 1.012 06/03/2020 1625   PHURINE 6.0 06/03/2020 1625   GLUCOSEU NEGATIVE 06/03/2020 1625   HGBUR NEGATIVE 06/03/2020 1625   BILIRUBINUR NEGATIVE 06/03/2020 1625   KETONESUR 5 (A) 06/03/2020 1625   PROTEINUR 100 (A) 06/03/2020 1625   NITRITE NEGATIVE 06/03/2020 1625   LEUKOCYTESUR NEGATIVE 06/03/2020 1625   Sepsis Labs Invalid input(s): PROCALCITONIN,  WBC,  LACTICIDVEN Microbiology Recent Results (from the past 240 hour(s))  Urine culture     Status: None   Collection Time: 06/03/20  4:25 PM   Specimen: Urine, Random  Result Value Ref Range Status   Specimen Description URINE, RANDOM  Final   Special Requests NONE  Final   Culture   Final    NO GROWTH Performed at [facility], [place] [address], [place], [place] [address]    Report Status 06/04/2020 FINAL  Final  SARS Coronavirus 2 by RT PCR (hospital order, performed in [facility] hospital lab) Nasopharyngeal Nasopharyngeal Swab     Status: None   Collection Time: 06/03/20  4:51 PM   Specimen: Nasopharyngeal Swab  Result Value Ref Range Status   SARS Coronavirus 2 NEGATIVE NEGATIVE Final    Comment: (NOTE) SARS-CoV-2 target nucleic acids are NOT DETECTED.  The SARS-CoV-2 RNA is generally detectable in upper and lower respiratory specimens during the acute phase of infection. The lowest concentration of SARS-CoV-2 viral copies this assay can detect is 250 copies / mL. A negative result does not preclude SARS-CoV-2 infection and should not be used as the sole basis for treatment or other patient management decisions.  A negative result may occur with improper specimen collection / handling, submission of specimen other than nasopharyngeal swab, presence of viral mutation(s)  within the areas targeted by this assay, and inadequate number of viral copies (<250 copies / mL). A negative result must be combined with clinical observations, patient history, and epidemiological information.  Fact Sheet for Patients:   StrictlyIdeas.no  Fact Sheet for Healthcare Providers: BankingDealers.co.za  This test is not yet approved or  cleared by the [place] and has been authorized for detection and/or diagnosis of SARS-CoV-2 by FDA under an Emergency Use Authorization (EUA).  This EUA will remain in effect (meaning this test can be used) for the duration of the COVID-19 declaration under Section 564(b)(1) of the Act, 21 U.S.C. section 360bbb-3(b)(1), unless the authorization is terminated or revoked sooner.  Performed at [facility], [place] [address] [place], [place] [address]   MRSA PCR Screening     Status: None   Collection Time: 06/04/20  9:26 AM   Specimen: Nasopharyngeal  Result Value Ref Range Status   MRSA by PCR NEGATIVE NEGATIVE Final    Comment:        The GeneXpert MRSA Assay (FDA approved for NASAL specimens only), is one component of a comprehensive MRSA colonization surveillance program. It is not intended to diagnose MRSA infection nor to guide or monitor treatment for MRSA infections. Performed at [facility], [place] [address] [place], [place] [address]      Time coordinating discharge: Over 30 minutes  SIGNED:   Charlynne Cousins, MD  [facility] 06/05/2020, 12:05 PM Pager   If 7PM-7AM, please contact night-coverage [email or web address] Password [email or web address]

## 2020-06-05 NOTE — Progress Notes (Signed)
[facility] PROGRESS NOTE    Progress Note  [name] [name]  WUG:[id number] DOB: Nov 05, 1941 DOA: 06/03/2020 PCP: [name], MD     Brief Narrative:   [name] [name] is an 79 y.o. 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  On examination by admitting physician she was lethargic and unable to carry on a conversation or answer questions.  CT scan of the abdomen without contrast showed bilateral kidney stranding, creatinine of 2.3 (back in March it was less than 1) bicarb 17 potassium 3.2 white blood cell count of 10 with a left shift cardiac biomarkers basically flat.  Assessment/Plan:   Acute abdominal pain: Despite IV cefepime her white blood cell count is  up. CT scan of the abdomen and pelvis 06/03/2020 showed bilateral perinephric stranding, 3.6 exophytic cystic mass in the right kidney arthrosclerosis.Bilateral stranding of kidneys. Lactic acid is 1.0, unlikely ischemic bowel checking LDH. I have spoken to 2 healthcare power of attorney explain the situation as she is rapidly deteriorating and how it would be a good idea to move towards hospice care as her body slowly shutting down and it seems that she is uncomfortable.  She is developing edema in her extremities. MRI of the brain is negative for stroke. They have agreed to meet with palliative care on 06/05/2020. They would like all labs and medications that are not indicated for comfort stopped. We will select to move her to a residential hospice facility. We will start her on IV fentanyl for comfort, Ativan for agitation. We will leave PICC in place.  Acute kidney injury/non-anion gap metabolic acidosis: Despite aggressive IV fluid resuscitation her  renal function continues to deteriorate. She is now hypernatremic and hyperchloremic will change her fluids to D5W. We will have to speak to the family to try to move towards comfort care.  Acute metabolic encephalopathy: CT scan of the head was unremarkable question due to infectious etiology. MRI of the brain showed no acute findings. She continues to be encephalopathic.  Essential hypertension, benign Lethargic, continues to be n.p.o. blood pressure is elevated.  We will stop checking vitals.  History of PE/DVT: Discontinue IV heparin.  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 Once the patient is awake consider physical therapy.  RN Pressure Injury Documentation: Pressure Injury 05/04/20 Buttocks Right;Left Stage 1 -  Intact skin with non-blanchable redness of a localized area usually over a bony prominence. reddened area on left and right buttocks (Active)  05/04/20 1710  Location: Buttocks  Location Orientation: Right;Left  Staging: Stage 1 -  Intact skin with non-blanchable redness of a localized area usually over a bony prominence.  Wound Description (Comments): reddened area on left and right buttocks  Present on Admission: Yes    Estimated body mass index is 24.09 kg/m as calculated from the following:   Height as of this encounter: 5\' 3"  (1.6 m).   Weight as of this encounter: 61.7 kg.   DVT prophylaxis: Heparin Family Communication:Friend HPOA: [phone], Almyra Free Status is: Inpatient  Remains inpatient appropriate because:Hemodynamically unstable   Dispo: The patient is from: Home  Anticipated d/c is to: SNF              Anticipated d/c date is: 1 day              Patient currently is not medically stable to d/c.  She will probably need to go to residential hospice facility.   Code Status:     Code Status Orders  (From  admission, onward)         Start     Ordered   06/03/20 1744  Full code  Continuous        06/03/20 1745        Code Status History    Date Active Date Inactive Code Status Order ID Comments User Context   05/04/2020 1802 05/15/2020 2156 Full Code [id number]  Miguel Aschoff Inpatient   01/27/2020 2010 02/21/2020 2112 Full Code [id number]  Malon Kindle Inpatient   01/26/2020 1647 01/27/2020 2010 Full Code [id number]  Belva Crome, MD Inpatient   Advance Care Planning Activity    Advance Directive Documentation     Most Recent Value  Type of Advance Directive Out of facility DNR (pink MOST or yellow form)  Pre-existing out of facility DNR order (yellow form or pink MOST form) Pink Most/Yellow Form available - Physician notified to receive inpatient order  "MOST" Form in Place? --        IV Access:    Peripheral IV   Procedures and diagnostic studies:   CT ABDOMEN PELVIS WO CONTRAST  Result Date: 06/03/2020 CLINICAL DATA:  Worsening abdominal pain. EXAM: CT ABDOMEN AND PELVIS WITHOUT CONTRAST TECHNIQUE: Multidetector CT imaging of the abdomen and pelvis was performed following the standard protocol without IV contrast. COMPARISON:  None. FINDINGS: Lower chest: Plate and screw fixation of the mediastinum. Abandoned epicardial wires noted. No pericardial effusion. Mildly enlarged heart. Hepatobiliary: No focal liver abnormality is seen. Status post cholecystectomy. No biliary dilatation. Pancreas: Unremarkable. No pancreatic ductal dilatation or surrounding inflammatory changes. Spleen: Normal in size without focal abnormality. Adrenals/Urinary Tract: No evidence of hydronephrosis. Bilateral perinephric stranding. Simple appearing 3.6 cm exophytic cyst off of the lower pole of the right kidney. No hydronephrosis or nephrolithiasis. Urinary bladder is normal. Stomach/Bowel: Stomach is within normal limits. Appendix appears normal. No evidence of bowel wall thickening,  distention, or inflammatory changes. Vascular/Lymphatic: Aortic atherosclerosis. No enlarged abdominal or pelvic lymph nodes. Reproductive: Status post hysterectomy. No adnexal masses. Other: No abdominal wall hernia or abnormality. No abdominopelvic ascites. Musculoskeletal: Spondylosis of the visualized spine. Intact right hip arthroplasty. IMPRESSION: 1. No evidence of acute abnormalities within the abdomen or pelvis. 2. Bilateral perinephric stranding, nonspecific. Please correlate to urinalysis. 3. Aortic atherosclerosis. Aortic Atherosclerosis (ICD10-I70.0). Electronically Signed   By: Fidela Salisbury M.D.   On: 06/03/2020 14:47   CT Head Wo Contrast  Result Date: 06/03/2020 CLINICAL DATA:  Ataxia.  History of prior infarct. EXAM: CT HEAD WITHOUT CONTRAST TECHNIQUE: Contiguous axial images were obtained from the base of the skull through the vertex without intravenous contrast. COMPARISON:  Head CT 01/31/2020. FINDINGS: Brain: No evidence of acute infarction, hemorrhage, hydrocephalus, extra-axial collection or mass lesion/mass effect. There is atrophy in the left subinsular cortex consistent with remote infarct. Also seen is some chronic microvascular ischemic change and atrophy. Vascular: No hyperdense vessel or unexpected calcification. Skull: Normal. Negative for fracture or focal lesion. Sinuses/Orbits: Negative. Other: None. IMPRESSION: No acute abnormality. Remote left subinsular infarct. Electronically Signed   By: Inge Rise  M.D.   On: 06/03/2020 16:16   MR BRAIN WO CONTRAST  Result Date: 06/04/2020 CLINICAL DATA:  Encephalopathy EXAM: MRI HEAD WITHOUT CONTRAST TECHNIQUE: Multiplanar, multiecho pulse sequences of the brain and surrounding structures were obtained without intravenous contrast. COMPARISON:  2019 FINDINGS: Brain: There is no acute infarction or intracranial hemorrhage. Interval chronic hemorrhagic infarction involving the left lentiform nucleus and caudate body with ex  vacuo dilatation of the adjacent left lateral ventricle. Superimposed prominence of the ventricles and sulci reflects minor generalized parenchymal volume loss. Additional patchy T2 hyperintensity in the supratentorial white matter is nonspecific but may reflect mild chronic microvascular ischemic changes. 8 x 4 mm dural-based lesion is present along the anterior right tentorial incisura (series 6, image 8) and has slightly increased in size since the prior study. There is no mass effect or edema. There is no hydrocephalus or extra-axial fluid collection. Vascular: Major vessel flow voids at the skull base are preserved. Skull and upper cervical spine: Normal marrow signal is preserved. Sinuses/Orbits: Minor mucosal thickening.  Orbits are unremarkable. Other: Sella is unremarkable. Minor patchy mastoid fluid opacification. IMPRESSION: No acute infarction or hemorrhage. Slight increase in size of still subcentimeter meningioma along the anterior right tentorial insertion. Interval chronic hemorrhagic infarct of the left basal ganglia. Mild chronic microvascular ischemic changes. Electronically Signed   By: Macy Mis M.D.   On: 06/04/2020 10:08   US Abdomen Complete  Result Date: 06/03/2020 CLINICAL DATA:  Acute kidney injury abdominal pain EXAM: ABDOMEN ULTRASOUND COMPLETE COMPARISON:  None. FINDINGS: Gallbladder: The patient is status post cholecystectomy. Common bile duct: Diameter: 8 mm Liver: No focal lesion identified. Within normal limits in parenchymal echogenicity. Portal vein is patent on color Doppler imaging with normal direction of blood flow towards the liver. IVC: No abnormality visualized. Pancreas: Not well visualized. Spleen: Size and appearance within normal limits. Right Kidney: Length: 10.7 cm. Echogenicity within normal limits. There is an anechoic cyst seen within the midpole of the right kidney measuring 3.4 x 2.9 x 2.7 cm. Mild amount of surrounding perinephric stranding/fluid. Left  Kidney: Length: 10.6 cm. Echogenicity within normal limits. No mass or hydronephrosis visualized. Mild amount of surrounding perinephric stranding/fluid. Abdominal aorta: Scattered atherosclerosis is noted. No evidence of aortic dissection. Maximum diameter measures 1.6 cm. Other findings: Small amount of abdominal ascites. IMPRESSION: No definite evidence of aortic dissection within the visualized portions. Normal appearing liver Mild bilateral perinephric stranding/fluid. Small amount of abdominal ascites Electronically Signed   By: Prudencio Pair M.D.   On: 06/03/2020 19:47     Medical Consultants:    None.  Anti-Infectives:   IV Vanc and cefepime  Subjective:    [name] [name] nonverbal moaning and groaning  Objective:    Vitals:   06/04/20 1119 06/04/20 1923 06/05/20 0252 06/05/20 0434  BP: (!) 171/53 (!) 167/92 (!) 188/74 (!) 158/100  Pulse: 67 70 70 68  Resp: 13 19 19 19   Temp: 97.8 F (36.6 C) 98.3 F (36.8 C) 97.7 F (36.5 C) 97.9 F (36.6 C)  TempSrc: Oral Oral Oral Oral  SpO2: 97% 98% 98% 100%  Weight:   61.7 kg   Height:       SpO2: 100 %   Intake/Output Summary (Last 24 hours) at 06/05/2020 0931 Last data filed at 06/05/2020 0859 Gross per 24 hour  Intake 10 ml  Output 800 ml  Net -790 ml   Filed Weights   06/03/20 2301 06/04/20 0445 06/05/20 0252  Weight: 56.1 kg 56.9  kg 61.7 kg    Exam: General exam: Unresponsive Respiratory system: Good air movement and clear to auscultation. Cardiovascular system: S1 & S2 heard, RRR. No JVD. Gastrointestinal system: Abdomen is nondistended, soft and nontender.  Extremities: 3+ edema. Skin: No rashes, lesions or ulcers  Data Reviewed:    Labs: Basic Metabolic Panel: Recent Labs  Lab 06/03/20 1239 06/03/20 1239 06/03/20 2111 06/03/20 2111 06/04/20 0433 06/05/20 0450  NA 141  --  146*  --  144 147*  K 3.2*   < > 3.3*   < > 3.8 3.0*  CL 114*  --   --   --  118* 121*  CO2 17*  --   --   --  15*  16*  GLUCOSE 90  --   --   --  88 74  BUN 46*  --   --   --  46* 41*  CREATININE 2.32*  --   --   --  2.26* 2.34*  CALCIUM 11.8*  --   --   --  11.6* 11.4*   < > = values in this interval not displayed.   GFR Estimated Creatinine Clearance: 16.9 mL/min (A) (by C-G formula based on SCr of 2.34 mg/dL (H)). Liver Function Tests: Recent Labs  Lab 06/03/20 1239 06/04/20 0433 06/05/20 0450  AST 15 18 14*  ALT 19 18 13   ALKPHOS 91 89 84  BILITOT 1.4* 1.1 1.3*  PROT 6.3* 6.2* 5.6*  ALBUMIN 2.5* 2.5* 2.3*   Recent Labs  Lab 06/03/20 1239  LIPASE 54*   No results for input(s): AMMONIA in the last 168 hours. Coagulation profile No results for input(s): INR, PROTIME in the last 168 hours. COVID-19 Labs  Recent Labs    06/04/20 1230  LDH 83*    Lab Results  Component Value Date   SARSCOV2NAA NEGATIVE 06/03/2020   [place] NEGATIVE 05/14/2020   [place] NEGATIVE 05/05/2020   [place] NEGATIVE 04/20/2020    CBC: Recent Labs  Lab 06/03/20 1239 06/03/20 2111 06/04/20 0433 06/05/20 0450  WBC 10.1  --  12.6* 11.4*  NEUTROABS 7.4  --   --   --   HGB 10.0* 8.5* 10.5* 9.4*  HCT 33.0* 25.0* 35.3* 31.3*  MCV 81.1  --  83.1 81.5  PLT 267  --  260 258   Cardiac Enzymes: No results for input(s): CKTOTAL, CKMB, CKMBINDEX, TROPONINI in the last 168 hours. BNP (last 3 results) No results for input(s): PROBNP in the last 8760 hours. CBG: No results for input(s): GLUCAP in the last 168 hours. D-Dimer: No results for input(s): DDIMER in the last 72 hours. Hgb A1c: No results for input(s): HGBA1C in the last 72 hours. Lipid Profile: No results for input(s): CHOL, HDL, LDLCALC, TRIG, CHOLHDL, LDLDIRECT in the last 72 hours. Thyroid function studies: Recent Labs    06/04/20 0038  TSH 12.529*   Anemia work up: No results for input(s): VITAMINB12, FOLATE, FERRITIN, TIBC, IRON, RETICCTPCT in the last 72 hours. Sepsis Labs: Recent Labs  Lab 06/03/20 1239  06/03/20 2100 06/04/20 0038 06/04/20 0433 06/05/20 0450  WBC 10.1  --   --  12.6* 11.4*  LATICACIDVEN  --  0.5 1.3  --   --    Microbiology Recent Results (from the past 240 hour(s))  Urine culture     Status: None   Collection Time: 06/03/20  4:25 PM   Specimen: Urine, Random  Result Value Ref Range Status   Specimen Description URINE, RANDOM  Final  Special Requests NONE  Final   Culture   Final    NO GROWTH Performed at [facility], [place] [address] [place], [place] [address]    Report Status 06/04/2020 FINAL  Final  SARS Coronavirus 2 by RT PCR (hospital order, performed in [facility] hospital lab) Nasopharyngeal Nasopharyngeal Swab     Status: None   Collection Time: 06/03/20  4:51 PM   Specimen: Nasopharyngeal Swab  Result Value Ref Range Status   SARS Coronavirus 2 NEGATIVE NEGATIVE Final    Comment: (NOTE) SARS-CoV-2 target nucleic acids are NOT DETECTED.  The SARS-CoV-2 RNA is generally detectable in upper and lower respiratory specimens during the acute phase of infection. The lowest concentration of SARS-CoV-2 viral copies this assay can detect is 250 copies / mL. A negative result does not preclude SARS-CoV-2 infection and should not be used as the sole basis for treatment or other patient management decisions.  A negative result may occur with improper specimen collection / handling, submission of specimen other than nasopharyngeal swab, presence of viral mutation(s) within the areas targeted by this assay, and inadequate number of viral copies (<250 copies / mL). A negative result must be combined with clinical observations, patient history, and epidemiological information.  Fact Sheet for Patients:   StrictlyIdeas.no  Fact Sheet for Healthcare Providers: BankingDealers.co.za  This test is not yet approved or  cleared by the [place] FDA and has been authorized for detection and/or diagnosis of  SARS-CoV-2 by FDA under an Emergency Use Authorization (EUA).  This EUA will remain in effect (meaning this test can be used) for the duration of the COVID-19 declaration under Section 564(b)(1) of the Act, 21 U.S.C. section 360bbb-3(b)(1), unless the authorization is terminated or revoked sooner.  Performed at [facility], [place] [address] [place], [place] [address]   MRSA PCR Screening     Status: None   Collection Time: 06/04/20  9:26 AM   Specimen: Nasopharyngeal  Result Value Ref Range Status   MRSA by PCR NEGATIVE NEGATIVE Final    Comment:        The GeneXpert MRSA Assay (FDA approved for NASAL specimens only), is one component of a comprehensive MRSA colonization surveillance program. It is not intended to diagnose MRSA infection nor to guide or monitor treatment for MRSA infections. Performed at [facility], [place] [address] [place],  [address]      Medications:   . aspirin  300 mg Rectal Daily  . atorvastatin  80 mg Oral Daily  . Chlorhexidine Gluconate Cloth  6 each Topical Daily  . feeding supplement (ENSURE ENLIVE)  237 mL Oral QID  . levothyroxine  100 mcg Intravenous Once per day on Sun Tue Thu Sat  . levothyroxine  50 mcg Intravenous Once per day on Mon Wed Fri  . pantoprazole (PROTONIX) IV  40 mg Intravenous Q12H  . sertraline  50 mg Oral QHS  . sodium chloride flush  10-40 mL Intracatheter Q12H  . vancomycin variable dose per unstable renal function (pharmacist dosing)   Does not apply See admin instructions   Continuous Infusions: . heparin 600 Units/hr (06/05/20 0847)  . meropenem (MERREM) IV 1 g (06/04/20 2303)      LOS: 2 days   Charlynne Cousins  [facility]  06/05/2020, 9:31 AM

## 2020-06-05 NOTE — TOC Transition Note (Signed)
Transition of Care [facility]) - CM/SW Discharge Note   Patient Details  Name: [name] [name] MRN: [id number] Date of Birth: June 20, 1941  Transition of Care [facility] CM/SW Contact:  Trula Ore, [place] Phone Number: 06/05/2020, 1:39 PM   Clinical Narrative:      Patient will DC to: [facility]  Anticipated DC date: 06/05/2020  Family notified: Joy  Transport by: Corey Harold  ?  Per MD patient ready for DC to [facility] . RN, patient, patient's family, and facility notified of DC. Discharge Summary sent to facility. RN given number for report tele#[phone].  DC packet on chart. Ambulance transport requested for patient.  CSW signing off.       Patient Goals and CMS Choice        Discharge Placement                       Discharge Plan and Services                                     Social Determinants of Health (SDOH) Interventions     Readmission Risk Interventions No flowsheet data found.

## 2020-06-05 NOTE — Patient Outreach (Signed)
[facility]) Care Management  06/05/2020  DEAJA RIZO May 27, 1941 [id number]  Second telephone outreach attempt to obtain mRS. CMA spoke with nurse at [facility]. Nurse verified patient was currently living at facility, but recommended CMA to call caregiver to complete mRs.  Ina Homes [facility] Management Assistant [phone]

## 2020-06-05 NOTE — Progress Notes (Addendum)
Dilaudid drip wasted ,witnessed by GeraldineC,RN. Pt discharged to [facility]. Report given to [facility].

## 2020-06-05 NOTE — Progress Notes (Signed)
[place] for Heparin Indication: pulmonary embolus  Allergies  Allergen Reactions  . Bee Venom Swelling    Massive swelling  . Penicillins Other (See Comments)    ** tolerates Zosyn + cephalosporins Did it involve swelling of the face/tongue/throat, SOB, or low BP? Unknown Did it involve sudden or severe rash/hives, skin peeling, or any reaction on the inside of your mouth or nose? Unknown Did you need to seek medical attention at a hospital or doctor's office? Unknown When did it last happen?1945 If all above answers are "NO", may proceed with cephalosporin use.  . Adhesive [Tape] Rash  . Erythromycin Nausea And Vomiting and Rash  . Morphine And Related Rash  . Sulfa Antibiotics Nausea And Vomiting and Rash    Patient Measurements: Height: 5\' 3"  (160 cm) Weight: 61.7 kg (136 lb) IBW/kg (Calculated) : 52.4 Heparin Dosing Weight: 57.3 kg   Vital Signs: Temp: 97.9 F (36.6 C) (07/20 0434) Temp Source: Oral (07/20 0434) BP: 158/100 (07/20 0434) Pulse Rate: 68 (07/20 0434)  Labs: Recent Labs    06/03/20 1239 06/03/20 1239 06/03/20 1530 06/03/20 2111 06/03/20 2111 06/04/20 0433 06/04/20 0433 06/04/20 1530 06/05/20 0450 06/05/20 0737  HGB 10.0*   < >  --  8.5*   < > 10.5*  --   --  9.4*  --   HCT 33.0*   < >  --  25.0*  --  35.3*  --   --  31.3*  --   PLT 267  --   --   --   --  260  --   --  258  --   APTT  --   --   --   --   --  150*   < > 109* >200* 133*  HEPARINUNFRC  --   --   --   --   --  1.10*   < > 0.64 1.78* 0.86*  CREATININE 2.32*  --   --   --   --  2.26*  --   --  2.34*  --   TROPONINIHS 10  --  11  --   --   --   --   --   --   --    < > = values in this interval not displayed.    Estimated Creatinine Clearance: 16.9 mL/min (A) (by C-G formula based on SCr of 2.34 mg/dL (H)).   Assessment: Patient is a 79 yr old female admitted for abdominal pain. She had a DVT earlier this year and has been taking  Xarelto at the nursing home which is now being held (last dose of Xarelto given at 2100 on 05/31/20; poor renal function). Pharmacy was consulted to dose heparin.   Heparin level and aPTT are supratherapeutic at 0.86, aPTT is 133 while on heparin 700 units/hr. CBC is stable and WNL. Per RN, no bleeding noted and CBC is stable. aPTT and heparin level are now aligning, the effects of Xarelto on heparin level are likely gone, therefore, will discontinue aPTT and check heparin levels only.  Goal of Therapy:  Heparin level 0.3-0.7 units/ml Monitor platelets by anticoagulation protocol: Yes   Plan:  Reduce heparin IV to 600 units/hr Check heparin level in 8 hrs Monitor daily heparin level and CBC Monitor for signs/symptoms of bleeding  Shauna Hugh, PharmD, [place]  PGY-1 Pharmacy Resident 06/05/2020 8:30 AM  Please check [email or web address] for unit-specific pharmacy phone numbers.

## 2020-06-05 NOTE — Consult Note (Signed)
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 She developed sternal osteomyelitis and subsequently CVA, PE and DVT.  She was was admitted on 06/03/2020 with lethargy.  Work up revealed new abdominal pain and renal failure.   The patient has 3 HCPOAs.  [place] Hageman, and [place].  I have spoken personally with Eustaquio Maize and Almyra Free who are in agreement with comfort measures and Hospice Facility care.  Clinical Assessment and Goals of Care:  I have reviewed medical records including EPIC notes, labs and imaging, received report from Dr. Venetia Constable, examined the patient and met at bedside with [facility] Almyra Free and Eustaquio Maize  to discuss diagnosis prognosis, GOC, EOL wishes, disposition and options.  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  She has been in control of her end of life wishes and filled out a MOST form on 05/15/2020 indicating she wanted Comfort Measures only.   Her wishes were clear to her HCPOAs.  This admission she has been suffering with abdominal pain, leg pain, and an inability to communicate.  Dr. Venetia Constable has done an excellent job of communicating with her HCPOAs and the decision has been made to transfer to a [facility].  Ms. Osterloh is clearly eligible for Hospice.  She is bedbound and unable to eat/drink.  She is in renal failure with vacillating blood pressures.  She currently requires a continuous infusion of opioid pain medication.  Hospice and Palliative Care services outpatient were explained and offered.  HCPOAs are  accepting of [facility].  Patient was already a patient with their Palliative service.  Questions and concerns were addressed.  The HCPOAs were encouraged to call with questions or concerns.    Primary Decision Maker:  Juluis Rainier and Almyra Free are at bedside.    SUMMARY OF RECOMMENDATIONS    Will order comfort measures only. Patient has a gold DNR form and MOST form in [place] Transfer to [facility] as soon as a bed is available. Will supplement comfort medications already ordered by Dr. Venetia Constable. Placed TOC order for [facility] Discussed with Chrislyn Edison Pace [facility] Representative.  Code Status/Advance Care Planning:  DNR   Symptom Management:   Comfort meds ordered including opioid gtt, low dose benzodiazepine, and medication for secretions PRN.  Additional Recommendations (Limitations, Scope, Preferences):  Full Comfort Care  Palliative Prophylaxis:   Frequent Pain Assessment  Psycho-social/Spiritual:   Desire for further Chaplaincy support: not discussed.  Prognosis:  Days to less than 2 weeks due to in ability to eat/drink, renal failure, bedbound status, desire for comfort measures only.    Discharge Planning: Hospice facility      Primary Diagnoses: Present on Admission: .  AKI (acute kidney injury) ([place]) . Essential hypertension, benign . Normocytic anemia . Malnutrition of moderate degree   I have reviewed the medical record, interviewed the patient and family, and examined the patient. The following aspects are pertinent.  Past Medical History:  Diagnosis Date  . Coronary artery disease   . Family history of adverse reaction to anesthesia    " My Sister did "  . HTN (hypertension)   . Hyperlipidemia   . Hypothyroidism   . Osteoporosis   . Stroke ([place])   . Thyroid carcinoma ([place])    Social History   Socioeconomic History  . Marital status: Divorced    Spouse name: Not on file  . Number of children: Not on file  . Years of  education: Not on file  . Highest education level: Not on file  Occupational History  . Occupation: retired Designer, jewellery  Tobacco Use  . Smoking status: Never Smoker  . Smokeless tobacco: Never Used  Vaping Use  . Vaping Use: Never used  Substance and Sexual Activity  . Alcohol use: Not Currently  . Drug use: Never  . Sexual activity: Not on file  Other Topics Concern  . Not on file  Social History Narrative  . Not on file   Social Determinants of Health   Financial Resource Strain:   . Difficulty of Paying Living Expenses:   Food Insecurity:   . Worried About Charity fundraiser in the Last Year:   . Arboriculturist in the Last Year:   Transportation Needs:   . Film/video editor (Medical):   Marland Kitchen Lack of Transportation (Non-Medical):   Physical Activity:   . Days of Exercise per Week:   . Minutes of Exercise per Session:   Stress:   . Feeling of Stress :   Social Connections:   . Frequency of Communication with Friends and Family:   . Frequency of Social Gatherings with Friends and Family:   . Attends Religious Services:   . Active Member of Clubs or Organizations:   . Attends Archivist Meetings:   Marland Kitchen Marital Status:    Family History  Problem Relation Age of Onset  . Breast cancer Mother 77  . Hypertension Mother   . Diabetes Mother   . Heart disease Mother   . Cirrhosis Father   . Cancer Brother   . Heart disease Brother   . Heart attack Sister     Allergies  Allergen Reactions  . Bee Venom Swelling    Massive swelling  . Penicillins Other (See Comments)    ** tolerates Zosyn + cephalosporins Did it involve swelling of the face/tongue/throat, SOB, or low BP? Unknown Did it involve sudden or severe rash/hives, skin peeling, or any reaction on the inside of your mouth or nose? Unknown Did you need to seek medical attention at a hospital or doctor's office? Unknown When did it last happen?1945 If all above answers are "NO", may  proceed with cephalosporin use.  . Adhesive [Tape] Rash  . Erythromycin Nausea And Vomiting and Rash  . Morphine And Related Rash  . Sulfa Antibiotics Nausea And Vomiting and Rash     Vital Signs: BP (!) 158/100 (BP Location: Right Arm)   Pulse 68   Temp 97.9 F (36.6 C) (Oral)   Resp 19   Ht 5' 3" (1.6 m)   Wt 61.7 kg   SpO2 100%   BMI 24.09 kg/m  Pain Scale: PAINAD   Pain  Score: Asleep   SpO2: SpO2: 100 % O2 Device:SpO2: 100 % O2 Flow Rate: .     Palliative Assessment/Data: 10%     Time In: 9:45 Time Out: 10:36 Time Total: 50 min. Visit consisted of counseling and education dealing with the complex and emotionally intense issues surrounding the need for palliative care and symptom management in the setting of serious and potentially life-threatening illness. Greater than 50%  of this time was spent counseling and coordinating care related to the above assessment and plan.  Signed by: Florentina Jenny, PA-C Palliative Medicine  Please contact Palliative Medicine Team phone at [phone] for questions and concerns.  For individual provider: See Shea Evans

## 2020-06-05 NOTE — Progress Notes (Signed)
Pharmacy Antibiotic Note  [name] [name] is a 79 y.o. female admitted on 06/03/2020. Pharmacy has been consulted for meropenem dosing. Pt is afebrile and WBC are elevated at 11.4. Patient was on a course of cefepime and vancomycin for sternal osteomyelitis s/p CABG. The patient is now on meropenem and vancomycin for broad coverage.The patient is also with AKI, SCr is 2.34.  Vancomycin random level was checked on 7/19 more than 48 hours after last dose and was increased at 26 ug/mL, vancomycin was not given as a result. Another random vancomycin level was checked on 7/20 and resulted above goal at 25 ug/mL.   Plan: Continue meropenem 1gm IV q12H Continue to hold vancomycin Obtain another random vancomycin level in the AM on 7/21 Monitor renal function, C&S and clinical status  Height: 5\' 3"  (160 cm) Weight: 61.7 kg (136 lb) IBW/kg (Calculated) : 52.4  Temp (24hrs), Avg:97.9 F (36.6 C), Min:97.7 F (36.5 C), Max:98.3 F (36.8 C)  Recent Labs  Lab 06/03/20 1239 06/03/20 2100 06/04/20 0038 06/04/20 0433 06/04/20 1230 06/05/20 0450 06/05/20 0737  WBC 10.1  --   --  12.6*  --  11.4*  --   CREATININE 2.32*  --   --  2.26*  --  2.34*  --   LATICACIDVEN  --  0.5 1.3  --   --   --   --   VANCORANDOM  --   --   --   --  26  --  25    Estimated Creatinine Clearance: 16.9 mL/min (A) (by C-G formula based on SCr of 2.34 mg/dL (H)).    Allergies  Allergen Reactions  . Bee Venom Swelling    Massive swelling  . Penicillins Other (See Comments)    ** tolerates Zosyn + cephalosporins Did it involve swelling of the face/tongue/throat, SOB, or low BP? Unknown Did it involve sudden or severe rash/hives, skin peeling, or any reaction on the inside of your mouth or nose? Unknown Did you need to seek medical attention at a hospital or doctor's office? Unknown When did it last happen?1945 If all above answers are "NO", may proceed with cephalosporin use.  . Adhesive [Tape] Rash  .  Erythromycin Nausea And Vomiting and Rash  . Morphine And Related Rash  . Sulfa Antibiotics Nausea And Vomiting and Rash    Antimicrobials this admission: Vanc PTA>> Cefepime PTA>>7/19 Meropenem 7/19>>  Microbiology results: 7/18: UCx NGTD 7/19: MRSA PCR neg  Thank you for allowing pharmacy to be a part of this patient's care.  Shauna Hugh, PharmD, [place]  PGY-1 Pharmacy Resident 06/05/2020 8:52 AM  Please check [email or web address] for unit-specific pharmacy phone numbers.

## 2020-06-05 NOTE — Patient Outreach (Signed)
[facility]) Care Management  06/05/2020  TRINETTA ALEMU [birth year] [id number]   Final telephone outreach attempt to obtain mRS. CMA spoke with Ms. Shaffer who states she has not been very involved in patients care enough to complete mRs. Ms. Elicia Lamp added she knew patient was readmitted and would be transferred to hospice later. In addition, she added best contact would be friend Warehouse manager. CMA shared outreach has been attempted, but contact has been unsuccessful.  3 outreach attempts were completed to obtain mRs. mRs could not be obtained because patient/caregiver never returned my calls. mRs=7   [facility] Management Assistant [phone]

## 2020-06-05 NOTE — Progress Notes (Signed)
Manufacturing engineer [facility]) Hospital Liaison note.   Received request from [place], [place] with PMT, for family interest in [facility]. Chart reviewed and eligibility confirmed. Spoke with Clent Ridges [place] to confirm interest and explain services. Family agreeable to transfer today. TOC aware.   [facility] will notify TOC when registration paperwork has been completed to arrange transport.  RN please call report to [phone]. Thank you for the opportunity to participate in this patient's care. Chrislyn Edison Pace, BSN, RN [place] (listed on [place] under Hospice/[facility])    [phone]

## 2020-06-06 NOTE — Progress Notes (Signed)
Late entry to document 90 day modified Rankin Score per stroke quality team.  Score based on review of PT eval on 06/04/20.    06/04/20 1200  Modified Rankin (Stroke Patients Only)  Modified Rankin [place], PT   Acute Rehabilitation Services  Pager [phone] Office [phone] 06/06/2020

## 2020-06-07 ENCOUNTER — Ambulatory Visit: Payer: PPO | Admitting: Infectious Diseases

## 2020-06-17 DEATH — deceased

## 2020-07-11 ENCOUNTER — Ambulatory Visit: Payer: PPO | Admitting: Adult Health

## 2021-03-19 IMAGING — DX DG CHEST 1V PORT
1 series · 1 of 1 positions shown · non-contrast
Comparison: Age yesterday

CLINICAL DATA: Status post CABG

EXAM:
PORTABLE CHEST 1 VIEW

[chest ap]
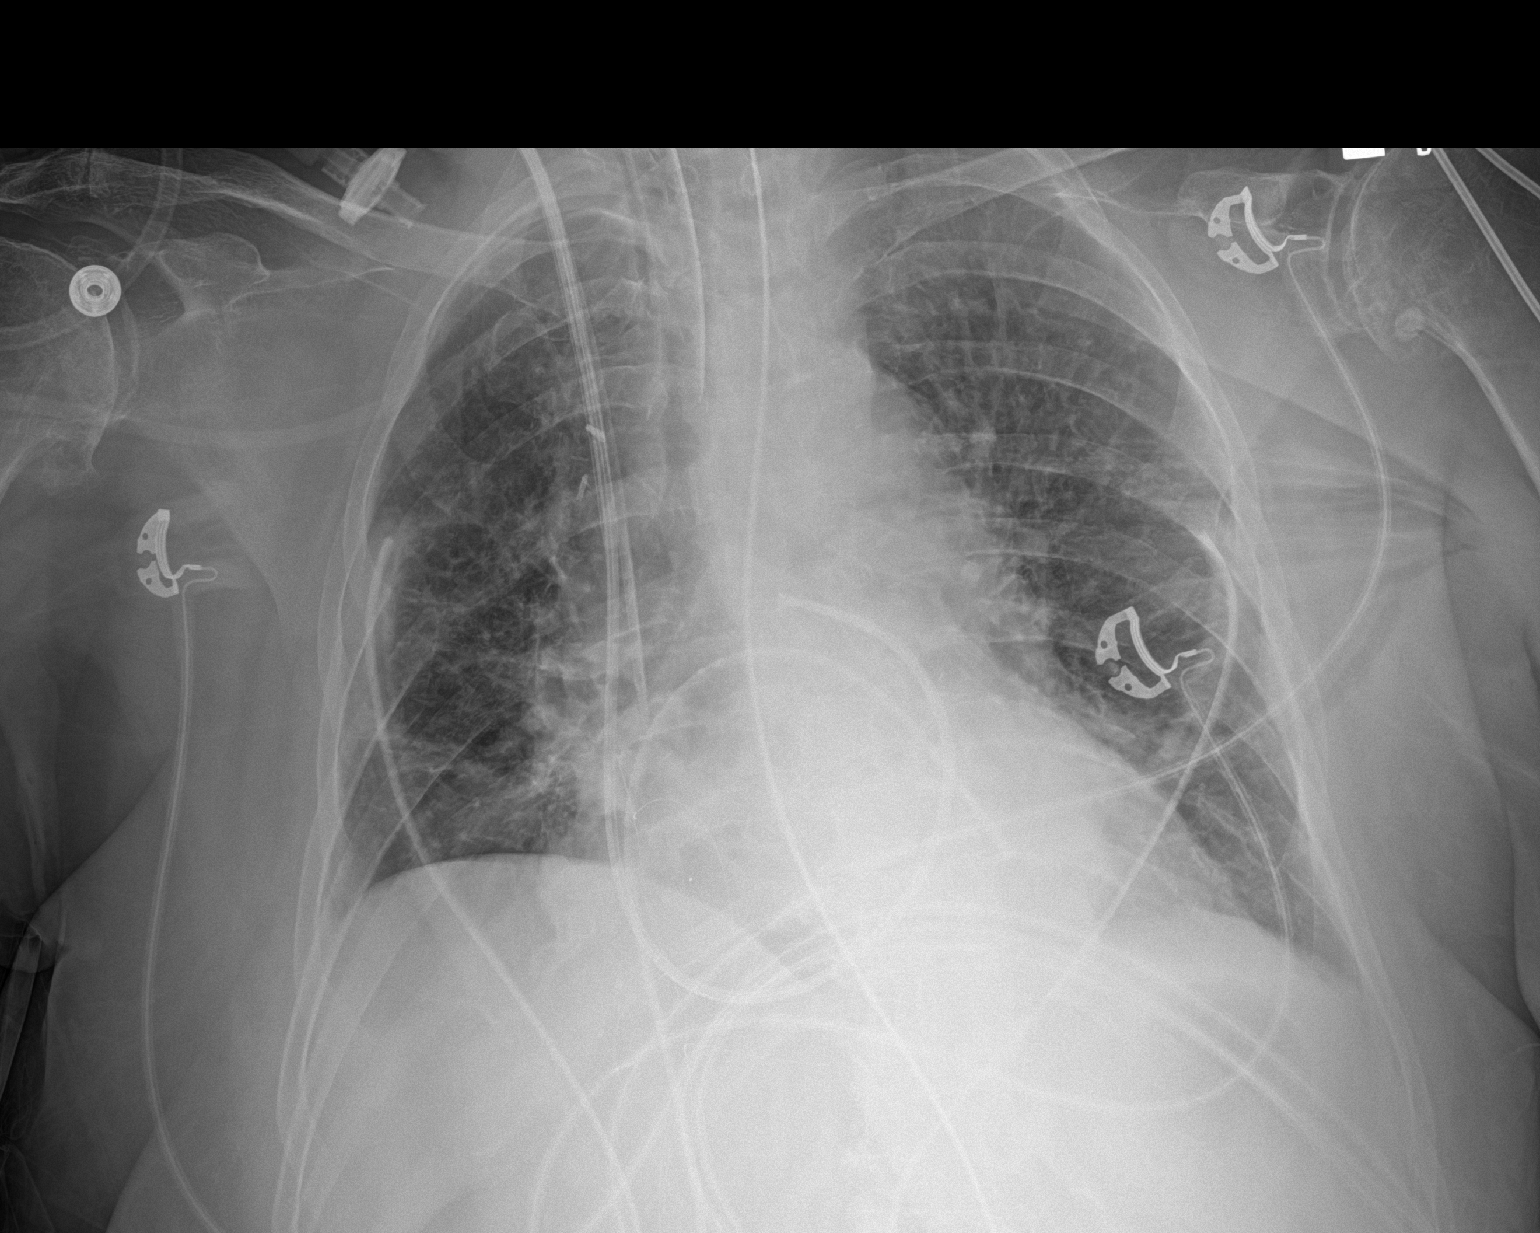

[1 of 1 positions shown; findings below may reference images not displayed]

FINDINGS: Swan-Ganz catheter tip is at the main pulmonary artery level. Right
IJ line in good position. Chest drains are present with no visible
pneumothorax. Endotracheal tube tip between the clavicular heads and
carina, slightly advanced from before. The orogastric tube reaches
the stomach.

Interstitial coarsening that is likely atelectasis. No visible
effusion.
IMPRESSION: 1. Unremarkable hardware positioning.
2. Patchy atelectasis that is also stable.

## 2021-03-21 IMAGING — XA IR PERCUTANEOUS ART THORMBECTOMY/INFUSION INTRACRANIAL INCLUDE D
10 series · 14 of 24 positions shown · non-contrast
Comparison: none

CLINICAL DATA: 78-year-old female with past medical history of
coronary artery disease, hypertension, hyperlipidemia and thyroid
cancer who was admitted for three-vessel CABG performed 2 days ago.
She developed the sudden right-sided weakness. Last known well was 6
a.m. today. Baseline mRS is unknown (likely 0-1). No IV tPA given
due to recent surgery. Head CT showed no acute infarct or
hemorrhage. CT angiogram showed a left ICA T occlusion extending to
the MCA bifurcation with moderate to poor collaterals. CT perfusion
showing possible core infarct of 74 mL with a mismatch core/penumbra
of 85 mL. She was brought to our service for an emergency mechanical
thrombectomy.

[Series 1: cerebral · 4 acquisitions, 1 frame shown (1 of 7)]
[im 1/4]
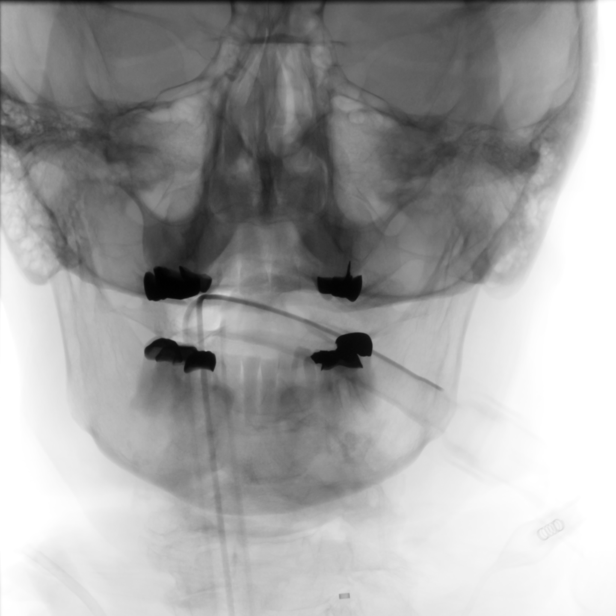

[Series 2: cerebral · 4 acquisitions, 1 frame shown (2 of 7)]
[im 1/4]
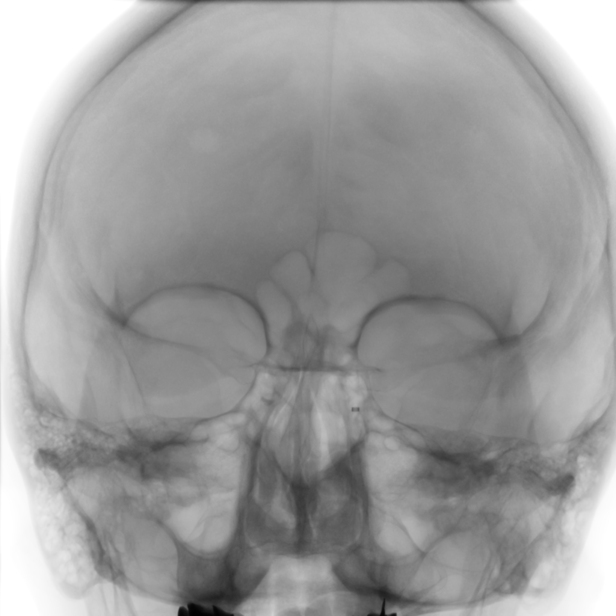

[Series 3: cerebral · 4 acquisitions, 2 frames shown (3 of 7)]
[im 1/4]
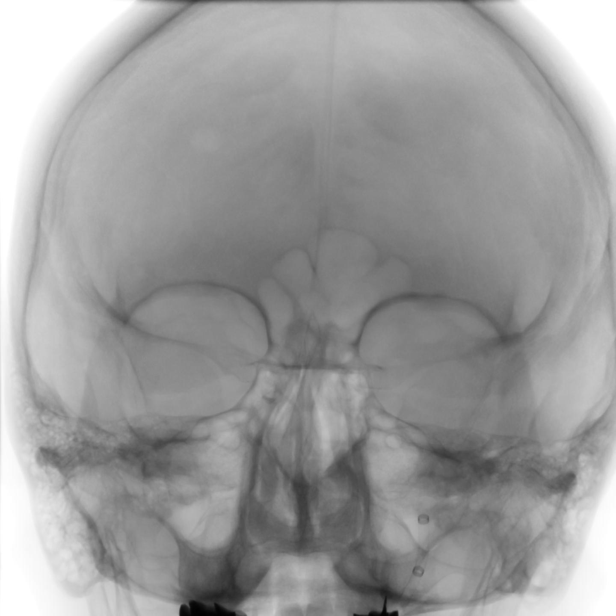
[im 1/4]
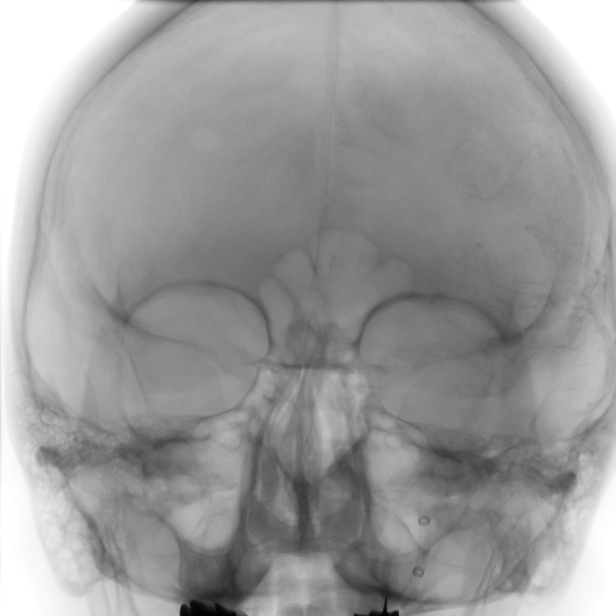

[Series 4: cerebral · 4 acquisitions, 1 frame shown (4 of 7)]
[im 1/4]
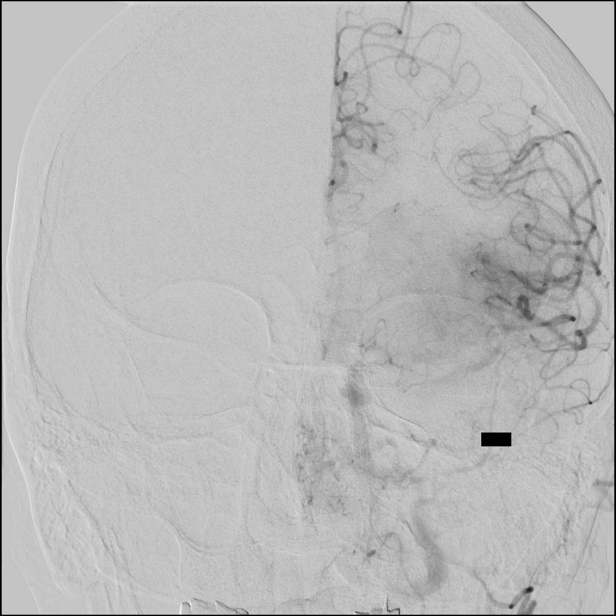

[Series 5: fl neuro · 2 acquisitions, 1 frame shown]
[im 1/2]
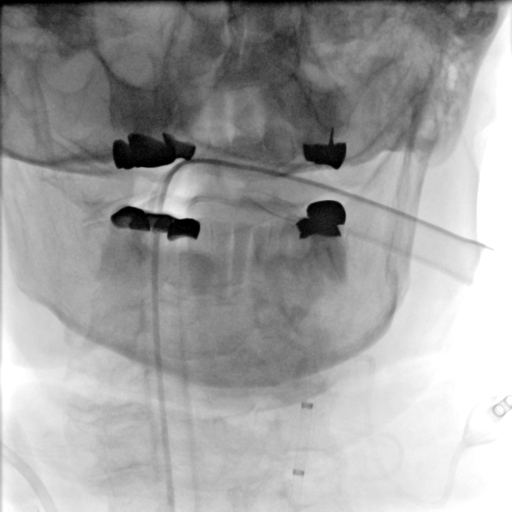

[Series 6: <mpr range> · axial · 5.0mm · 0.38mm/px · z∈[-177,-77]mm · 3 of 34 slices shown]
[im 4/34]
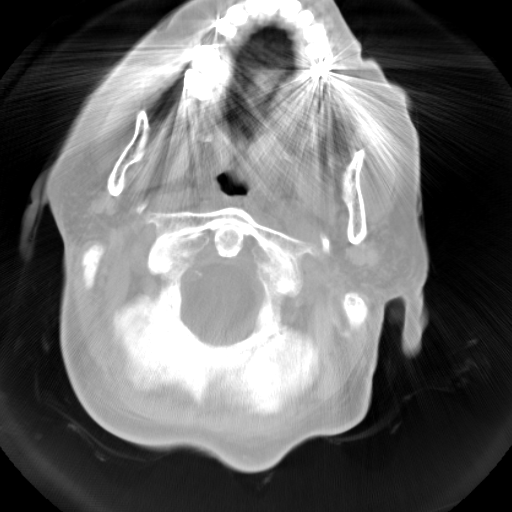
[im 10/34]
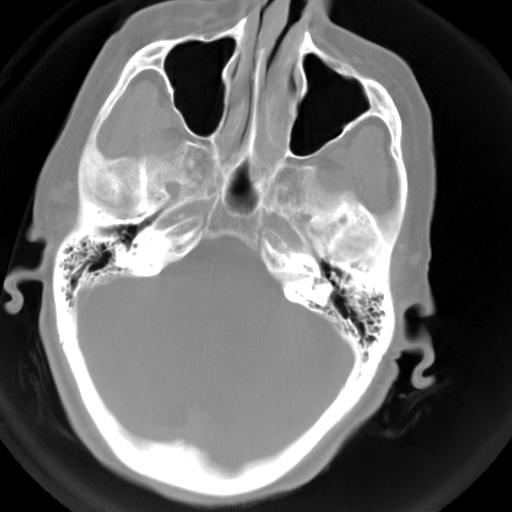
[im 24/34]
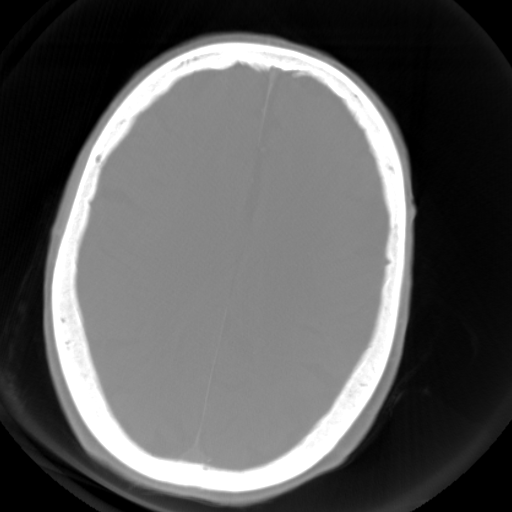

[Series 6: (id) bleed · 2 of 496 frames shown]
[frame 75/496]
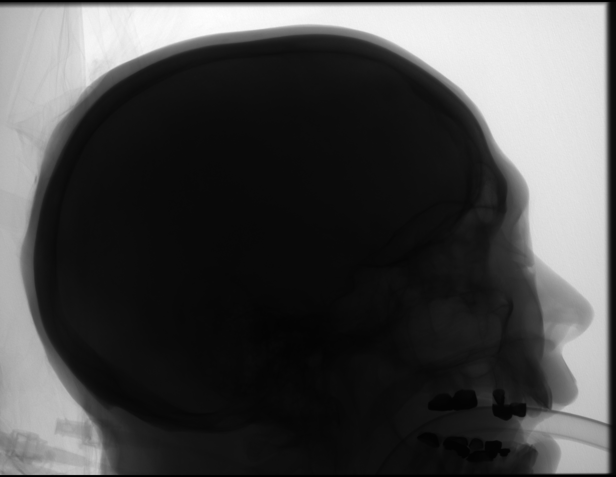
[frame 495/496]
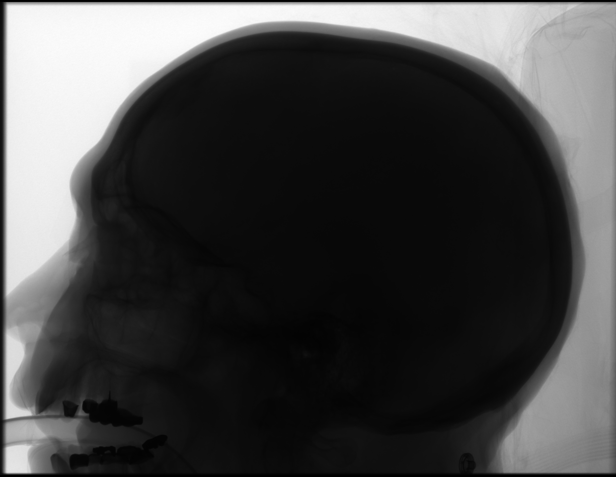

[Series 7: cerebral · 4 acquisitions, 1 frame shown (5 of 7)]
[im 1/4]
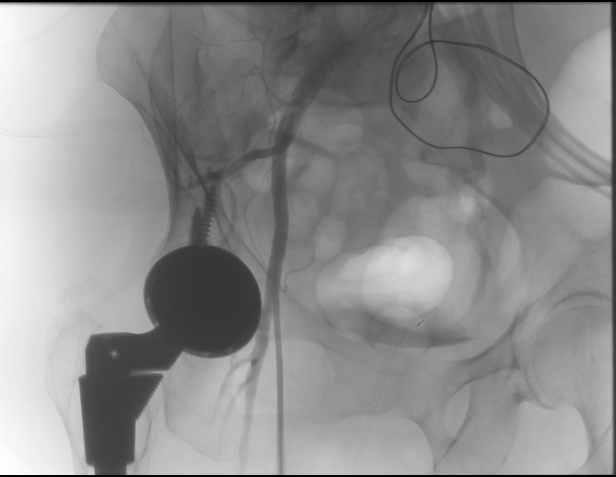

[Series 300: cerebral · 1 of 9 slices shown (6 of 7)]
[im 5/9]
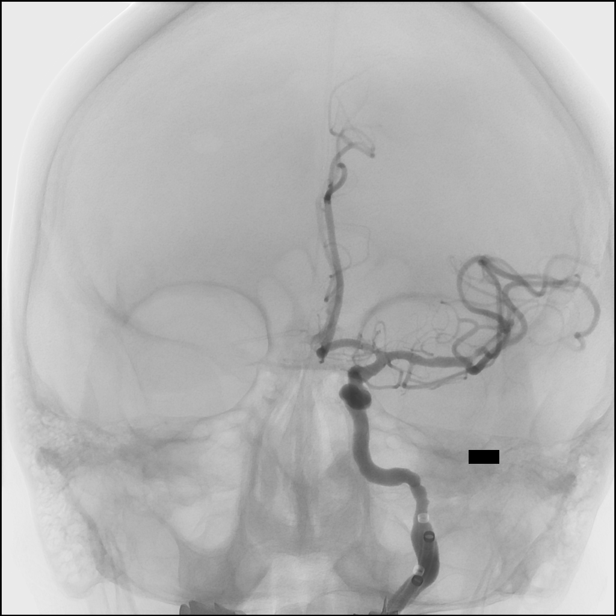

[Series 300: cerebral · 1 of 9 slices shown (7 of 7)]
[im 9/9]
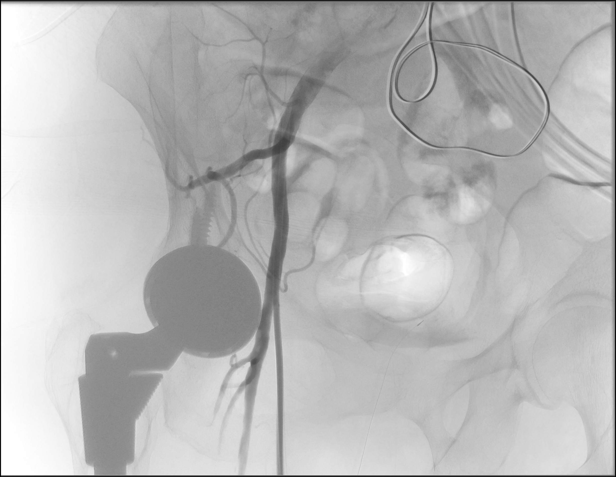

[14 of 24 positions shown; findings below may reference images not displayed]

EXAM:
IR PERCUTANEOUS ART THORMBECTOMY/INFUSION INTRACRANIAL INCLUDE DIAG
ANGIO; IR CT HEAD LIMITED

ANESTHESIA/SEDATION:
General

MEDICATIONS:
Intra-arterial verapamil 5 mg

PROCEDURE:
DIAGNOSTIC CEREBRAL ANGIOGRAM AND MECHANICAL THROMBECTOMY/ASPIRATION

Informed consent was obtained from patient's next of Gu and attach
to patient's chart.

The patient was prepped and draped using usual sterile technique.
Using a micropuncture kit and modified Seldinger technique, access
was gained to the right common femoral artery and an 8 French sheath
was placed.

Under fluoroscopy, an 8 French walrus balloon guide catheter was
navigated over a 6 Shalley Jim catheter and a 0.038" Terumo
Glidewire into the aortic arch. Under fluoroscopy, the catheter was
placed into the left common carotid artery. Frontal and lateral
angiograms of the neck were obtained. Atherosclerotic disease is
seen involving the distal left common carotid artery, carotid
bifurcation and proximal cervical left ICA, more pronounced at the
level of the bulb where a calcified plaque causes approximately 45%
stenosis. A proximal left M1/MCA occlusion is identified with
moderate collaterals from the left ACA to the left MCA territory.

Using biplane roadmap, a catalyst 6 aspiration catheter was
navigated over a phenom 21 microcatheter and a synchro support
microguidewire into the petrous segment of the left ICA. Frontal and
lateral angiograms of the head were obtained, confirming the
proximal left M1 occlusion.

The microcatheter was then navigated over the microwire into the
left M2/MCA middle division branch. A 6 mm solitaire thrombectomy
device was then deployed spanning the left M1 and left M2/MCA middle
division branch. The microcatheter was removed under fluoroscopy.
The thrombectomy device was allowed to intercalated with the clot
for 4 minutes. The guiding catheter balloon was inflated in the
upper cervical segment of the left ICA. The thrombectomy device and
aspiration catheter were then removed under constant aspiration.

Follow-up angiogram showed recanalization of the left M1/MCA with
slow flow in the middle division branch related to device induced
vasospasm.

Intra arterial infusion of 5 mg of verapamil was performed to the
left ICA over 10 minutes. Follow-up angiogram showed near complete
resolution of the vasospasm with complete restoration of flow (TICI
3).

Postprocedure flat panel CT of the head was obtained. Images were
post processed in a separate workstation under concurrent attending
physician supervision. No evidence of hemorrhagic complication
identified.

Left common femoral artery angiogram in frontal and lateral views
showed access at the level of the common femoral artery. The sheath
was removed and the access site was closed with a Perclose ProGlide.
Immediate hemostasis achieved.

COMPLICATIONS:
None immediate
FINDINGS: 1. Atherosclerotic disease of the left carotid bifurcation with
calcified plaque resulting in 45% stenosis.
2. Proximal left M1/MCA occlusion with moderate collaterals.
IMPRESSION: 1. Successful and uncomplicated mechanical thrombectomy and
aspiration for treatment of a left M1/MCA proximal occlusion with
complete restoration of flow (TICI 3) after 1 thrombectomy device
pass.
2. No hemorrhagic complication or embolus to new territory.

PLAN:
Follow-up 1 month after discharge. Carotid duplex recommended prior
to consultation.

## 2021-03-21 IMAGING — DX DG CHEST 1V PORT
1 series · 1 of 1 positions shown · non-contrast
Comparison: 01/30/2020

CLINICAL DATA: Intubation of airway

EXAM:
PORTABLE CHEST 1 VIEW

[chest ap]
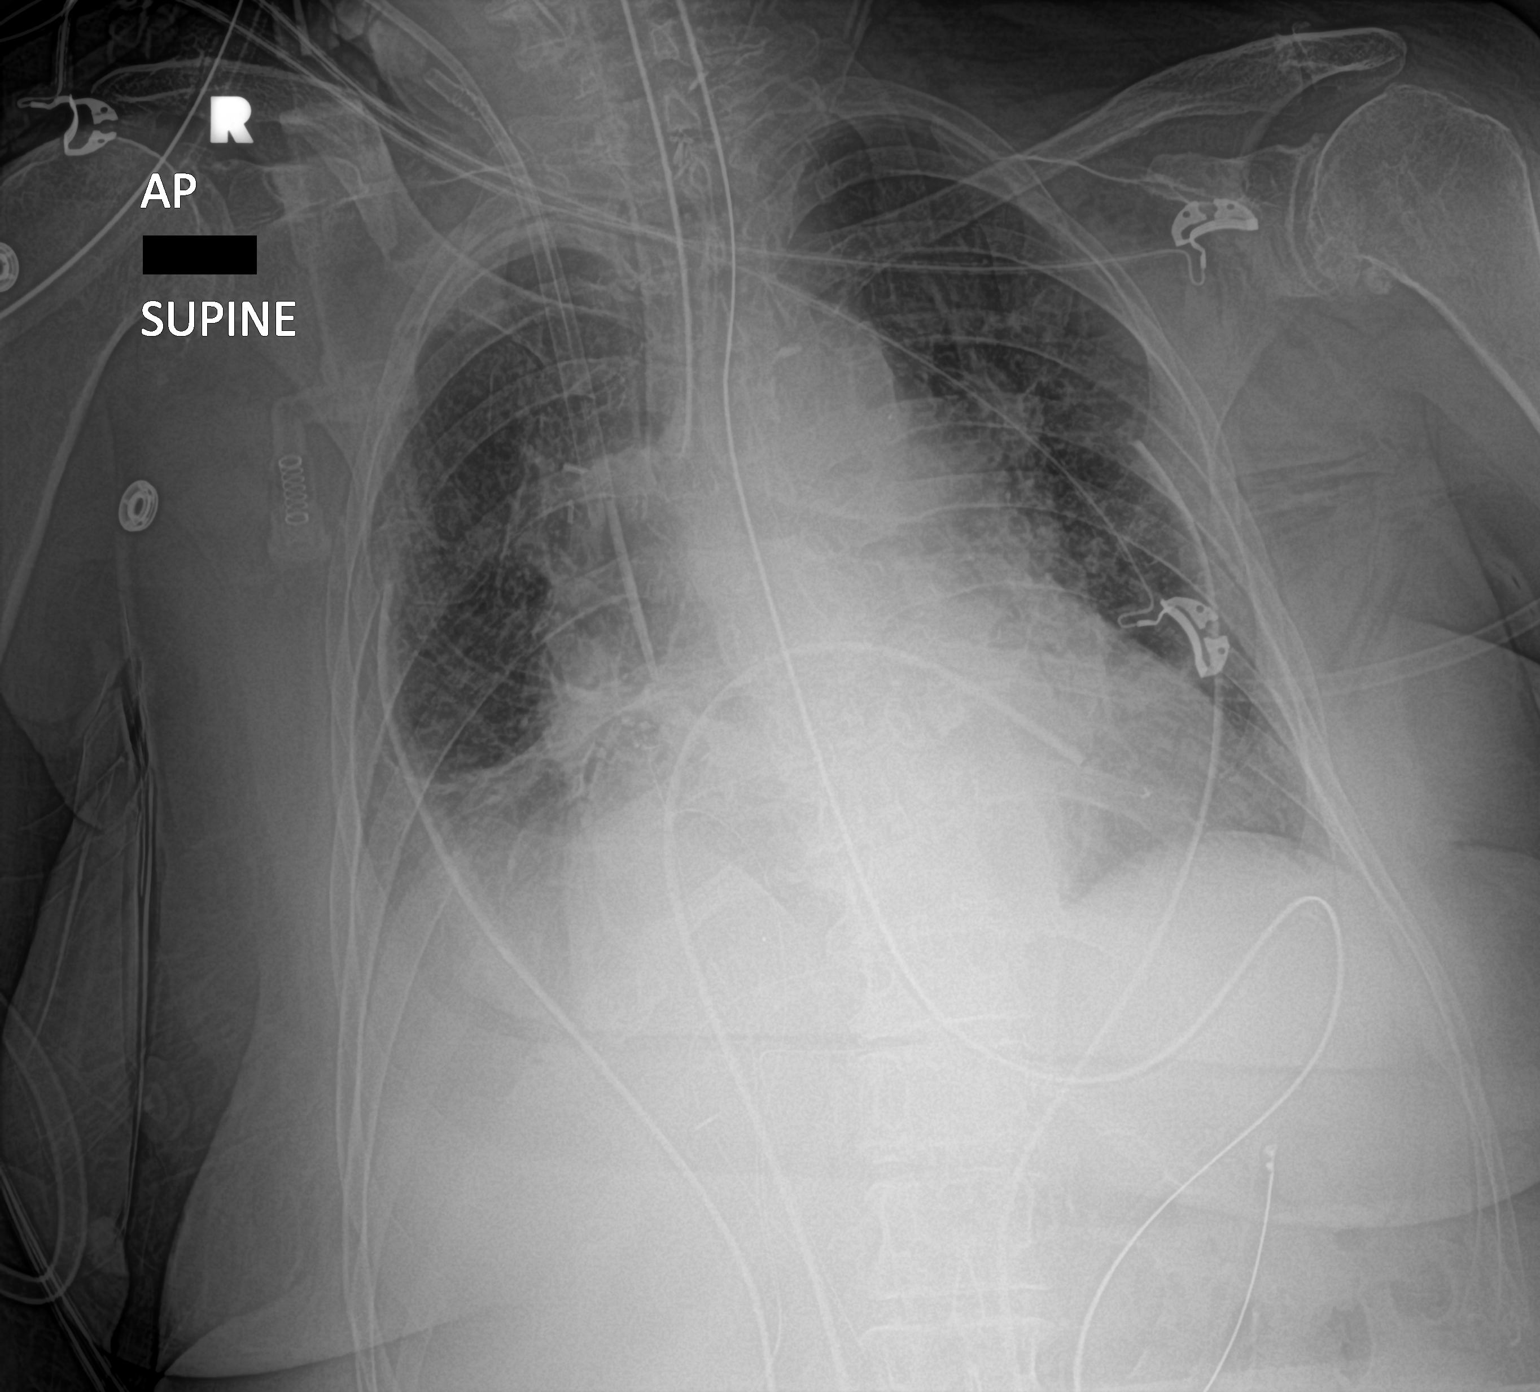

[1 of 1 positions shown; findings below may reference images not displayed]

FINDINGS: Endotracheal tube tip 2 cm above the carina. NGT placed with the tip
in the stomach.

Right jugular central venous catheter tips unchanged. Two catheters
in place in the right SVC.

Bilateral chest tubes in place. Mediastinal drain in place. No
pneumothorax.

Right lower lobe atelectasis unchanged. Right hilar surgical clips
again noted. No significant effusion.
IMPRESSION: Endotracheal tube placed with the tip 2 cm above the carina.

Right lower lobe atelectasis unchanged. Bilateral chest tubes
without pneumothorax.

## 2021-03-21 IMAGING — DX DG CHEST 1V PORT
1 series · 1 of 1 positions shown · non-contrast
Comparison: Portable exam 2727 hours compared to 01/29/2020

CLINICAL DATA: Chest tube, post open heart surgery

EXAM:
PORTABLE CHEST 1 VIEW

[chest ap]
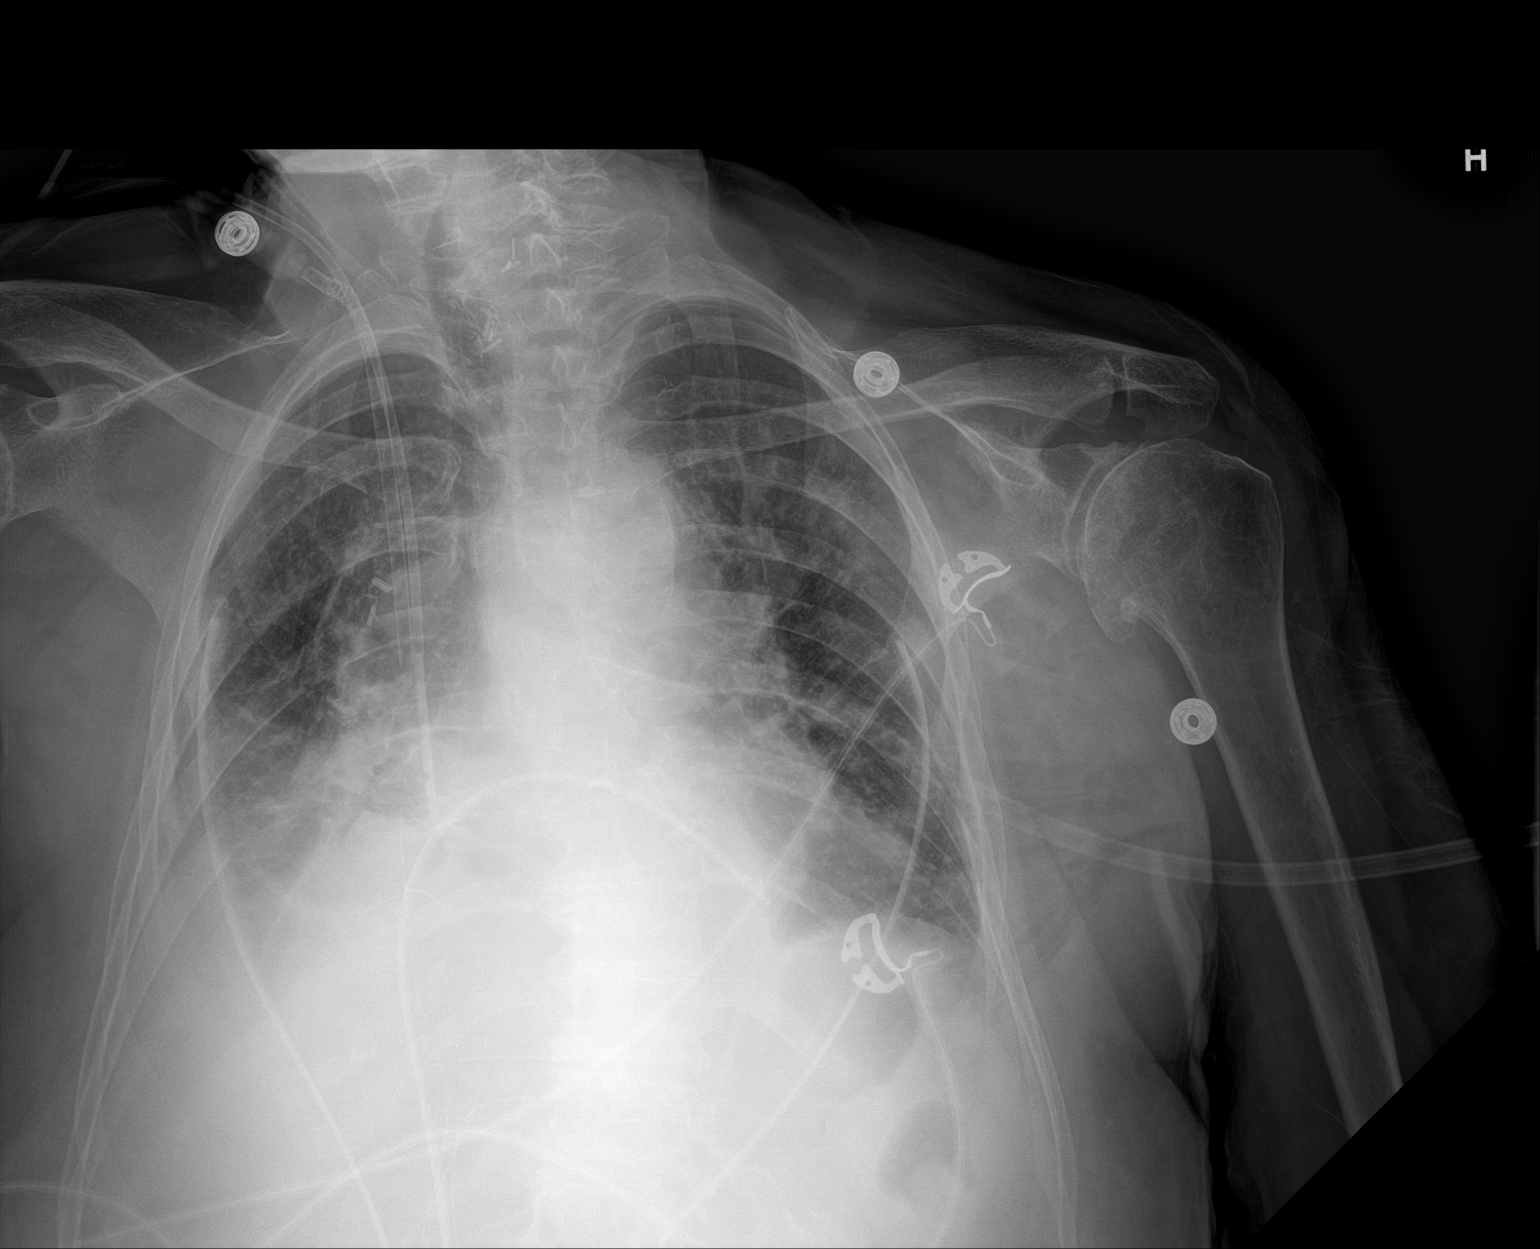

[1 of 1 positions shown; findings below may reference images not displayed]

FINDINGS: Mediastinal drain and BILATERAL thoracostomy tubes present.

Two RIGHT jugular central venous catheters, tips projecting over SVC
and cavoatrial junction.

Epicardial pacing wires present.

Enlargement of cardiac silhouette with pulmonary vascular
congestion.

Prominent mediastinum consistent with preceding surgery.

Scattered atelectasis in the mid to lower lungs bilaterally with
small RIGHT pleural effusion.

Tiny LEFT apex pneumothorax, decreased.

Bones demineralized with LEFT glenohumeral degenerative changes
noted as well as degenerative changes of the thoracic spine.
IMPRESSION: Bibasilar atelectasis and RIGHT pleural effusion.

Tiny LEFT apex pneumothorax despite thoracostomy tube, decreased.
